# Patient Record
Sex: Female | Born: 1945
Health system: Southern US, Community
[De-identification: ages and names within clinical notes are randomized; demographics above are authoritative.]

## PROBLEM LIST (undated history)

## (undated) DIAGNOSIS — G250 Essential tremor: Secondary | ICD-10-CM

## (undated) DIAGNOSIS — J189 Pneumonia, unspecified organism: Secondary | ICD-10-CM

## (undated) DIAGNOSIS — E039 Hypothyroidism, unspecified: Secondary | ICD-10-CM

## (undated) DIAGNOSIS — D649 Anemia, unspecified: Secondary | ICD-10-CM

## (undated) DIAGNOSIS — Z87442 Personal history of urinary calculi: Secondary | ICD-10-CM

## (undated) DIAGNOSIS — G2581 Restless legs syndrome: Secondary | ICD-10-CM

## (undated) DIAGNOSIS — M8580 Other specified disorders of bone density and structure, unspecified site: Secondary | ICD-10-CM

## (undated) DIAGNOSIS — G4730 Sleep apnea, unspecified: Secondary | ICD-10-CM

## (undated) DIAGNOSIS — I499 Cardiac arrhythmia, unspecified: Secondary | ICD-10-CM

## (undated) DIAGNOSIS — M1990 Unspecified osteoarthritis, unspecified site: Secondary | ICD-10-CM

## (undated) DIAGNOSIS — E119 Type 2 diabetes mellitus without complications: Secondary | ICD-10-CM

## (undated) DIAGNOSIS — M797 Fibromyalgia: Secondary | ICD-10-CM

## (undated) DIAGNOSIS — I10 Essential (primary) hypertension: Secondary | ICD-10-CM

## (undated) DIAGNOSIS — R000 Tachycardia, unspecified: Secondary | ICD-10-CM

## (undated) DIAGNOSIS — Z8489 Family history of other specified conditions: Secondary | ICD-10-CM

## (undated) DIAGNOSIS — K219 Gastro-esophageal reflux disease without esophagitis: Secondary | ICD-10-CM

## (undated) DIAGNOSIS — E8801 Alpha-1-antitrypsin deficiency: Secondary | ICD-10-CM

## (undated) DIAGNOSIS — F32A Depression, unspecified: Secondary | ICD-10-CM

## (undated) DIAGNOSIS — R0600 Dyspnea, unspecified: Secondary | ICD-10-CM

## (undated) DIAGNOSIS — K5792 Diverticulitis of intestine, part unspecified, without perforation or abscess without bleeding: Secondary | ICD-10-CM

## (undated) DIAGNOSIS — K589 Irritable bowel syndrome without diarrhea: Secondary | ICD-10-CM

## (undated) DIAGNOSIS — K5790 Diverticulosis of intestine, part unspecified, without perforation or abscess without bleeding: Secondary | ICD-10-CM

## (undated) DIAGNOSIS — J449 Chronic obstructive pulmonary disease, unspecified: Secondary | ICD-10-CM

## (undated) DIAGNOSIS — R519 Headache, unspecified: Secondary | ICD-10-CM

## (undated) DIAGNOSIS — N200 Calculus of kidney: Secondary | ICD-10-CM

## (undated) DIAGNOSIS — E7800 Pure hypercholesterolemia, unspecified: Secondary | ICD-10-CM

## (undated) HISTORY — DX: Essential tremor: G25.0

## (undated) HISTORY — DX: Restless legs syndrome: G25.81

## (undated) HISTORY — PX: TRIGGER FINGER RELEASE: SHX641

## (undated) HISTORY — DX: Unspecified osteoarthritis, unspecified site: M19.90

## (undated) HISTORY — DX: Fibromyalgia: M79.7

## (undated) HISTORY — DX: Alpha-1-antitrypsin deficiency: E88.01

## (undated) HISTORY — DX: Diverticulosis of intestine, part unspecified, without perforation or abscess without bleeding: K57.90

## (undated) HISTORY — PX: ABDOMINAL HYSTERECTOMY: SHX81

## (undated) HISTORY — PX: OTHER SURGICAL HISTORY: SHX169

## (undated) HISTORY — DX: Hypothyroidism, unspecified: E03.9

## (undated) HISTORY — DX: Tachycardia, unspecified: R00.0

## (undated) HISTORY — DX: Essential (primary) hypertension: I10

## (undated) HISTORY — PX: TONSILLECTOMY: SUR1361

## (undated) HISTORY — DX: Sleep apnea, unspecified: G47.30

## (undated) HISTORY — DX: Irritable bowel syndrome, unspecified: K58.9

## (undated) HISTORY — PX: APPENDECTOMY: SHX54

## (undated) HISTORY — DX: Chronic obstructive pulmonary disease, unspecified: J44.9

## (undated) HISTORY — PX: CHOLECYSTECTOMY: SHX55

## (undated) HISTORY — DX: Pure hypercholesterolemia, unspecified: E78.00

## (undated) HISTORY — PX: THUMB FUSION: SUR636

## (undated) HISTORY — DX: Calculus of kidney: N20.0

## (undated) HISTORY — DX: Other specified disorders of bone density and structure, unspecified site: M85.80

## (undated) HISTORY — PX: BACK SURGERY: SHX140

## (undated) HISTORY — DX: Type 2 diabetes mellitus without complications: E11.9

## (undated) HISTORY — PX: CATARACT EXTRACTION: SUR2

## (undated) HISTORY — DX: Diverticulitis of intestine, part unspecified, without perforation or abscess without bleeding: K57.92

---

## 1999-03-20 HISTORY — PX: BREAST LUMPECTOMY: SHX2

## 2000-05-27 ENCOUNTER — Encounter: Payer: Self-pay | Admitting: Cardiovascular Disease

## 2000-05-27 ENCOUNTER — Inpatient Hospital Stay (HOSPITAL_COMMUNITY): Admission: EM | Admit: 2000-05-27 | Discharge: 2000-05-29 | Payer: Self-pay | Admitting: *Deleted

## 2000-05-28 HISTORY — PX: CARDIAC CATHETERIZATION: SHX172

## 2000-08-16 ENCOUNTER — Emergency Department (HOSPITAL_COMMUNITY): Admission: EM | Admit: 2000-08-16 | Discharge: 2000-08-16 | Payer: Self-pay | Admitting: *Deleted

## 2000-08-19 ENCOUNTER — Encounter: Payer: Self-pay | Admitting: Internal Medicine

## 2000-08-20 ENCOUNTER — Ambulatory Visit (HOSPITAL_COMMUNITY): Admission: RE | Admit: 2000-08-20 | Discharge: 2000-08-20 | Payer: Self-pay | Admitting: Internal Medicine

## 2005-05-11 ENCOUNTER — Emergency Department (HOSPITAL_COMMUNITY): Admission: EM | Admit: 2005-05-11 | Discharge: 2005-05-11 | Payer: Self-pay | Admitting: Emergency Medicine

## 2009-03-07 ENCOUNTER — Encounter: Payer: Self-pay | Admitting: Gastroenterology

## 2009-03-21 ENCOUNTER — Telehealth (INDEPENDENT_AMBULATORY_CARE_PROVIDER_SITE_OTHER): Payer: Self-pay

## 2009-03-22 ENCOUNTER — Encounter: Payer: Self-pay | Admitting: Gastroenterology

## 2009-03-28 ENCOUNTER — Ambulatory Visit: Payer: Self-pay | Admitting: Internal Medicine

## 2009-03-28 ENCOUNTER — Ambulatory Visit (HOSPITAL_COMMUNITY): Admission: RE | Admit: 2009-03-28 | Discharge: 2009-03-28 | Payer: Self-pay | Admitting: Gastroenterology

## 2009-04-29 ENCOUNTER — Encounter: Payer: Self-pay | Admitting: Internal Medicine

## 2010-04-18 NOTE — Letter (Signed)
Summary: triage tcs order  triage tcs order   Imported By: Diana Eves 03/07/2009 14:13:21  _____________________________________________________________________  External Attachment:    Type:   Image     Comment:   External Document  Appended Document: triage tcs order Pt needs to complete therapy for diverticulitus and then [facility] procedure with TRILYTE PREP @ JAN 10.  Appended Document: triage tcs order Pt informed. She did not want to schedule for 03/28/2009 yet, until she checks with her insurance. She is having some changes for the New Year. I cancelled IDX appt for 03/15/09 and informed Kim in Endo.  Appended Document: triage tcs order Pt called back for appt 03/28/2009. Appt made for 10:30 on the 10th. Confirmed with Wayne in Endo. Pt aware ot the instructions, which will be faxed to [facility] in [place], [place]. along with the Rx.   Appended Document: triage tcs order Rx and instructions faxed to [facility] in [place], [phone].

## 2010-04-18 NOTE — Letter (Signed)
Summary: Internal Other/triage  Internal Other/triage   Imported By: Cloria Spring LPN 09/81/1914 78:29:56  _____________________________________________________________________  External Attachment:    Type:   Image     Comment:   External Document

## 2010-04-18 NOTE — Progress Notes (Signed)
Summary: Previous Name was Heather Espinoza  08/16/1945  Phone Note Call from Patient   Caller: Patient Summary of Call: Pt said her previous info was listed under [place] (08/31/1945). Initial call taken by: Cloria Spring LPN,  March 21, 2009 2:41 PM    B

## 2010-04-18 NOTE — Letter (Signed)
Summary: DISABILITY CLAIM  DISABILITY CLAIM   Imported By: Diana Eves 04/29/2009 15:16:43  _____________________________________________________________________  External Attachment:    Type:   Image     Comment:   External Document

## 2010-08-04 NOTE — Discharge Summary (Signed)
[place]  Patient:    Heather Espinoza, Heather Espinoza                       MRN: [id number] Adm. Date:  [id number] Disc. Date: [id number] Attending:  Ruta Hinds Dictator:   Raymon Mutton, P.A. CC:         Jonell Cluck, M.D.   Discharge Summary  ADMISSION DIAGNOSES: 1. Chest pain, rule out myocardial infarction. 2. Hypertension.  DISCHARGE DIAGNOSES: 1. Chest pain, undetermined etiology. 2. Hypertension. 3. Possible gastroesophageal reflux disease, rule out gallbladder disease. 4. Irritable bowel syndrome. 5. Status post coronary angiography with normal coronary artery stent left    ventricle.  DISCHARGE MEDICATIONS: 1. Lopressor 25 mg half of pill t.i.d. 2. Altace 5 mg one pill q.d. 3. Protonix 40 mg one pill q.d.  HISTORY OF PRESENT ILLNESS:  Ms. Manson Passey presented to the emergency room on May 27, 2000, with complaint of chest pain and pressure and some shortness of breath.  She noticed this probably for about a week.  She had some substernal chest tightness and discomfort.  It was intermittent and unrelated to any activity and lasted approximately 30 minutes with occasional radiation to the left arm. She denied shortness of breath, nausea, and diaphoresis. Today, she was seen in Dr. Lars Pinks office by Dr. Manson Passey and was referred to [facility] to rule out myocardial infarction.  The patient said that she has never felt this way before.  PROCEDURES:  On May 28, 2000, the patient underwent retrograde central aortic catheterization and central coronary angiography performed by Dr. Alanda Amass.  COMPLICATIONS:  None.  CONSULTS:  None.  HOSPITAL COURSE:  She was admitted May 27, 2000, at 6:50 p.m. and started on Plavix, nitroglycerin drip, and IV heparin per pharmacy protocol.  EKG on admission showed normal sinus rhythm with heart rate 70 and no ischemic changes.  A set of cardiac enzymes from the emergency room showed CK 60,  CK-MB 0.5, and troponin I 0.03.  The next day, the patient underwent cardiac catheterization performed by Dr. Alanda Amass which showed normal contracting left ventricle with ejection fraction approximately 55%.  There was mild angiographic mitral valve prolapse, but no mitral regurgitation.  Left anterior descending artery and left first diagonal arteries were widely patent, smooth, and normal throughout their course.  The right coronary artery was a dominant vessel that was widely patent and small throughout its course with normal PDA and PLA.  No coronary spasm during this study was noted, and the patient was sent to holding area in stable condition and then transferred to the floor.  _______ with no complications.  The next day, the patient was ready for discharge home.  DISCHARGE LABORATORY DATA:  Labs on the day of discharge are as follows: White blood cells 6.4, hemoglobin 11.8, hematocrit 34.7, platelets 325. Sodium 137, potassium 3.5, chloride 104, CO2 26, glucose 100, BUN 8, and creatinine 0.8.  Two other sets of cardiac enzymes were negative with CK-MB 0.3 and 0.7, total CK 49 and 52, and troponin I 0.02 and 0.01.  Lipid profile showed cholesterol of _______, triglycerides 141, HDL cholesterol _______ , and LDL cholesterol 112.  TSH was elevated at 6.329.  PHYSICAL EXAMINATION AT DISCHARGE:  VITAL SIGNS:  Temperature 97.4, pulse 66, blood pressure 90-100 systolic over 50-60 diastolic, saturation 98% on room air.  _______  NECK:  No jugular venous distension, no carotid bruits, no thyromegaly or lymphadenopathy.  CHEST:  Lungs clear  to auscultation bilaterally.  CARDIOVASCULAR:  Heart regular rate and rhythm without any murmur, gallops, or rubs.  ABDOMEN:  Soft, nontender, nondistended, present bowel sounds x 4.  EXTREMITIES:  Lower extremities did show any edema with intact pedal pulses bilaterally.  SKIN:  Groin site did not have any bleeding or any bruising  _______.  DISPOSITION:  The patient was discharged home in stable condition.  DISCHARGE INSTRUCTIONS:  Avoid any strenuous physical activity or driving for 72 hours after the cath.  DISCHARGE FOLLOWUP:  She was instructed to contact Dr. _______ office, and telephone number was provided to schedule follow-up appointment with Dr. _______ for GI workup and appointment with Dr. Nobie Putnam was scheduled on June 05, 2000 at 10 a.m. DD:  06/19/00 TD:  06/19/00 Job: [id number]

## 2010-08-04 NOTE — Cardiovascular Report (Signed)
[place]  Patient:    Heather Espinoza, Heather Espinoza                       MRN: [id number] Proc. Date: 05/28/00 Adm. Date:  [id number] Attending:  Ruta Hinds CC:         CP Lab  Dr. Elpidio Eric, c/o [facility], [place]  Guy Franco, P.A., c/o [facility], [place]   Cardiac Catheterization  PROCEDURES:  Retrograde central aortic catheterization, central coronary angiography by Judkins technique, left ventricular angiogram RAO and LAO projection, abdominal aortic angiogram midstream PA projection.  DESCRIPTION OF PROCEDURE:  The above procedure was done using percutaneous modified Seldinger techniques and standard procedure.  The FA was entered with a single anterior puncture using an 18 thin-walled needle under 1% Xylocaine anesthesia after 5 mg of Valium p.o. premedication and 25 mg of Benadryl IV. The patient was given 2 mg of Versed and 2 mg of Nubain for sedation in the laboratory.  A 6 French, 4 cm tapered preformed coronary and pigtail Cordis 6 French catheters were used through a 6 [place] short Daig sidearm sheath for coronary angiography in multiple projections, followed by LV angiogram in the RAO and LAO projection.  Abdominal aortic angiogram was done in the midstream PA projection at 20 cc, 20 cc per second.  This demonstrated normal renal arteries, normal proximal celiac and SMA axis.  IMA was intact, and there was good runoff with normal proximal iliacs.  There was no aneurysm or stenosis. The renal arteries were single and normal bilaterally.  Angiography was done because of the patients new diagnosis of hypertension.  Catheters were removed, sidearm sheath was flushed.  After review of the cineangiograms, the right femoral puncture site was closed successfully with a single stitch 6 [place] Perclose device.  The patient was taken to the holding area for postoperative care.  She tolerated the procedure  well.  RESULTS:  Pressures:  LV:  160/0; LVEDP 16-18 mmHg.  CA:  160/82 mmHg.  There was no gradient across the aortic valve on catheter pullback.  Fluoroscopy did not reveal any coronary, intracardiac, or valvular calcification.  LV angiogram demonstrated a normally-contracting ventricle with no segmental wall motion abnormality and normal EF, approximately 55%.  There was mild angiographic mitral valve prolapse but no mitral regurgitation.  The main left coronary was normal.  The left anterior descending and large first diagonal were widely patent, smooth, and normal throughout their course.  There was a small first marginal branch that was normal.  The remainder of the circumflex was made up of a large second marginal branch that bifurcated and a CABG branch that was normal.  The right coronary artery was a dominant vessel that was widely patent and smooth throughout its course, with normal PDA and PLA and normal, large LV branch in the proximal portion.  There was also no coronary spasm during the study.  DISCUSSION:  Heather Espinoza is a 65 year old white, nonsmoking, married mother of two with five grandchildren of her own, with five step-grandchildren.  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  She has a history of irritable bowel syndrome but has not had any GI bleeding.  She was referred to [place] from Dr. Moishe Spice office for episodic chest tightness over three to four days duration described as substernal tightness without radiation.  There was some vague relief with nitroglycerin in the office.  Blood pressure was elevated to 150/90 without  prior history of hypertension. EKG showed no significant changes, with sinus rhythm and minor IVCD. Myocardial infarction was ruled out by serial enzymes and EKGs.  The patient had several loose bowel movements in the hospital and was reported by the nurses to have "blood in her stool."  At that time, precatheterization heparin was  discontinued.  I did a rectal personally, and she had brown stool with no blood at all, and this was Hemoccult-negative.  We proceeded with catheterization in this setting.  Fortunately, she has normal coronary arteries and left ventricle with no evidence of coronary spasm.  I believe her chest pain is probably GI in nature, and it may be gallbladder and/or esophageal in etiology.  Apparently she has not had GI consultation with a chronic irritable bowel syndrome, but she has been treated medically and has been quite stable.  In view of this recent exacerbation of her lower GI symptoms and possible upper GI etiology, GI consultation may be helpful.  She is to continue medical therapy of her newly-diagnosed mild hypertension with normal renal arteries.  Reassurance about her coronary status.  CATHETERIZATION DIAGNOSES: 1. Chest pain, etiology not determined. 2. Normal coronary arteries and left ventricle. 3. Possible gastroesophageal reflux disease, rule out gallbladder disease. 4. Irritable bowel syndrome. 5. Reported hematochezia on heparin; however, rectal negative for blood as    outlined above. 6. Mild hypertension, new diagnosis. 7. History of irritable bowel syndrome. DD:  05/28/00 TD:  05/29/00 Job: [id number]

## 2011-08-01 ENCOUNTER — Other Ambulatory Visit (HOSPITAL_COMMUNITY): Payer: Self-pay | Admitting: Physician Assistant

## 2011-08-01 DIAGNOSIS — Z1231 Encounter for screening mammogram for malignant neoplasm of breast: Secondary | ICD-10-CM

## 2011-08-02 ENCOUNTER — Encounter (HOSPITAL_COMMUNITY): Payer: Self-pay | Admitting: Dietician

## 2011-08-02 NOTE — Progress Notes (Signed)
[facility] Diabetes Class Completion  Date:Aug 02, 2011  Time: 6:30 PM  Pt attended [facility]'s Diabetes Class on Aug 02, 2011.   Patient was educated on the following topics: carbohydrate metabolism in relation to diabetes, sources of carbohydrate, carbohydrate counting, meal planning strategies, food label reading, and portion control.   Aniello Christopoulos A. Kayan, RD, LDN Date:Aug 02, 2011 Time: 6:30 PM 

## 2011-08-03 ENCOUNTER — Other Ambulatory Visit (HOSPITAL_COMMUNITY): Payer: Self-pay | Admitting: Physician Assistant

## 2011-08-03 ENCOUNTER — Ambulatory Visit (HOSPITAL_COMMUNITY)
Admission: RE | Admit: 2011-08-03 | Discharge: 2011-08-03 | Disposition: A | Discharge status: Home / Self Care | Payer: Self-pay | Source: Ambulatory Visit | Attending: Physician Assistant | Admitting: Physician Assistant

## 2011-08-03 ENCOUNTER — Ambulatory Visit (HOSPITAL_COMMUNITY): Payer: Self-pay

## 2011-08-03 DIAGNOSIS — Z1231 Encounter for screening mammogram for malignant neoplasm of breast: Secondary | ICD-10-CM | POA: Insufficient documentation

## 2011-08-03 DIAGNOSIS — Z139 Encounter for screening, unspecified: Secondary | ICD-10-CM

## 2012-09-01 ENCOUNTER — Other Ambulatory Visit (HOSPITAL_COMMUNITY): Payer: Self-pay | Admitting: Physician Assistant

## 2012-09-01 DIAGNOSIS — Z139 Encounter for screening, unspecified: Secondary | ICD-10-CM

## 2012-09-08 ENCOUNTER — Ambulatory Visit (HOSPITAL_COMMUNITY): Payer: Self-pay

## 2012-09-09 ENCOUNTER — Ambulatory Visit (HOSPITAL_COMMUNITY)
Admission: RE | Admit: 2012-09-09 | Discharge: 2012-09-09 | Disposition: A | Discharge status: Home / Self Care | Payer: Self-pay | Source: Ambulatory Visit | Attending: Physician Assistant | Admitting: Physician Assistant

## 2012-09-09 DIAGNOSIS — Z139 Encounter for screening, unspecified: Secondary | ICD-10-CM

## 2012-09-11 ENCOUNTER — Other Ambulatory Visit: Payer: Self-pay | Admitting: Physician Assistant

## 2012-09-11 DIAGNOSIS — R928 Other abnormal and inconclusive findings on diagnostic imaging of breast: Secondary | ICD-10-CM

## 2012-10-24 ENCOUNTER — Other Ambulatory Visit (HOSPITAL_COMMUNITY): Payer: Self-pay | Admitting: Internal Medicine

## 2012-10-24 DIAGNOSIS — Z09 Encounter for follow-up examination after completed treatment for conditions other than malignant neoplasm: Secondary | ICD-10-CM

## 2012-10-24 DIAGNOSIS — R921 Mammographic calcification found on diagnostic imaging of breast: Secondary | ICD-10-CM

## 2012-11-04 ENCOUNTER — Encounter (INDEPENDENT_AMBULATORY_CARE_PROVIDER_SITE_OTHER): Payer: Self-pay | Admitting: *Deleted

## 2012-11-12 ENCOUNTER — Ambulatory Visit (HOSPITAL_COMMUNITY)
Admission: RE | Admit: 2012-11-12 | Discharge: 2012-11-12 | Disposition: A | Discharge status: Home / Self Care | Payer: Medicare Other | Source: Ambulatory Visit | Attending: Internal Medicine | Admitting: Internal Medicine

## 2012-11-12 ENCOUNTER — Other Ambulatory Visit (HOSPITAL_COMMUNITY): Payer: Self-pay | Admitting: Internal Medicine

## 2012-11-12 DIAGNOSIS — R921 Mammographic calcification found on diagnostic imaging of breast: Secondary | ICD-10-CM

## 2012-11-12 DIAGNOSIS — R928 Other abnormal and inconclusive findings on diagnostic imaging of breast: Secondary | ICD-10-CM | POA: Insufficient documentation

## 2012-11-24 ENCOUNTER — Ambulatory Visit (INDEPENDENT_AMBULATORY_CARE_PROVIDER_SITE_OTHER): Payer: Medicare Other | Admitting: Internal Medicine

## 2012-11-24 ENCOUNTER — Other Ambulatory Visit (INDEPENDENT_AMBULATORY_CARE_PROVIDER_SITE_OTHER): Payer: Self-pay | Admitting: *Deleted

## 2012-11-24 ENCOUNTER — Telehealth (INDEPENDENT_AMBULATORY_CARE_PROVIDER_SITE_OTHER): Payer: Self-pay | Admitting: *Deleted

## 2012-11-24 ENCOUNTER — Encounter (INDEPENDENT_AMBULATORY_CARE_PROVIDER_SITE_OTHER): Payer: Self-pay | Admitting: Internal Medicine

## 2012-11-24 VITALS — BP 130/58 | HR 76 | Temp 98.0°F | Ht 61.5 in | Wt 148.2 lb

## 2012-11-24 DIAGNOSIS — M797 Fibromyalgia: Secondary | ICD-10-CM | POA: Insufficient documentation

## 2012-11-24 DIAGNOSIS — E7800 Pure hypercholesterolemia, unspecified: Secondary | ICD-10-CM | POA: Insufficient documentation

## 2012-11-24 DIAGNOSIS — E119 Type 2 diabetes mellitus without complications: Secondary | ICD-10-CM | POA: Insufficient documentation

## 2012-11-24 DIAGNOSIS — Z1211 Encounter for screening for malignant neoplasm of colon: Secondary | ICD-10-CM

## 2012-11-24 DIAGNOSIS — E039 Hypothyroidism, unspecified: Secondary | ICD-10-CM | POA: Insufficient documentation

## 2012-11-24 DIAGNOSIS — I10 Essential (primary) hypertension: Secondary | ICD-10-CM | POA: Insufficient documentation

## 2012-11-24 DIAGNOSIS — Z800 Family history of malignant neoplasm of digestive organs: Secondary | ICD-10-CM

## 2012-11-24 NOTE — Telephone Encounter (Signed)
Patient needs movi prep 

## 2012-11-24 NOTE — Progress Notes (Signed)
Subjective:     Patient ID: Heather Espinoza, female   DOB: March 17, 1946, 67 y.o.   MRN: [id number]  HPI Referred to our office by Dr. Sherryll Burger for diarrhea/colonoscopy. Patient is requesting a colonoscopy. She underwent a colonoscopy in 2011 by Dr. Jena Gauss. Ileocolonoscopy, diagnostic. Friable anal canal otherwise normal rectum. Scattered pan colonic diverticula. Colonic mucosa and terminal mucosa appeared normal status post appendectomy. Appetite is not good.  She has lost about 15 pounds over a year.  She does have some epigastric and lower abdominal pain. She has diarrhea often. She also has constipation. When she takes the Fiber supplements she becomes constipated. She usually has watery diarrhea daily. She usually goes 4-5 times a day. She has had diarrhea for over 20 yrs which is not new. She takes Imodium on a prn basis.  She sometimes see blood when she wipes when she has gone to the [place] multiple times to have a BM. She will have constipation 3-4 times a month.  She also tells me she has had 3 bouts of diverticulitis. Her last bout was a year ago.   Family hx of colon cancer in a brother deceased at age 33. Diagnosed at age 53. Married.  Two children in good health Retired from [employer].   Review of Systems see hpi has a current medication list which includes the following prescription(s): acetaminophen, albuterol, cetirizine, duloxetine, esomeprazole, fluticasone-salmeterol, gabapentin, levothyroxine, lisinopril, loperamide, metformin, promethazine, and simvastatin. Past Surgical History  Procedure Laterality Date  . Complete hysterectomy    . Appendectomy    . Cholecystectomy    . Tonsillectomy    . Rt elbow surgery    . Heel tumor removed     No current outpatient prescriptions on file prior to visit.   No current facility-administered medications on file prior to visit.   Current outpatient prescriptions:acetaminophen (TYLENOL) 500 MG tablet, Take 500 mg by mouth every 6 (six) hours as  needed for pain., Disp: , Rfl: ;  albuterol (PROVENTIL HFA;VENTOLIN HFA) 108 (90 BASE) MCG/ACT inhaler, Inhale 2 puffs into the lungs every 6 (six) hours as needed for wheezing., Disp: , Rfl: ;  cetirizine (ZYRTEC) 10 MG tablet, Take 10 mg by mouth daily., Disp: , Rfl:  DULoxetine (CYMBALTA) 60 MG capsule, Take 60 mg by mouth daily., Disp: , Rfl: ;  esomeprazole (NEXIUM) 40 MG capsule, Take 40 mg by mouth daily before breakfast., Disp: , Rfl: ;  Fluticasone-Salmeterol (ADVAIR) 250-50 MCG/DOSE AEPB, Inhale 1 puff into the lungs every 12 (twelve) hours., Disp: , Rfl: ;  gabapentin (NEURONTIN) 600 MG tablet, Take 600 mg by mouth 3 (three) times daily., Disp: , Rfl:  levothyroxine (SYNTHROID, LEVOTHROID) 50 MCG tablet, Take 50 mcg by mouth daily before breakfast., Disp: , Rfl: ;  lisinopril (PRINIVIL,ZESTRIL) 10 MG tablet, Take 10 mg by mouth daily., Disp: , Rfl: ;  loperamide (IMODIUM) 2 MG capsule, Take 2 mg by mouth 4 (four) times daily as needed for diarrhea or loose stools., Disp: , Rfl: ;  metformin (FORTAMET) 500 MG (OSM) 24 hr tablet, Take 500 mg by mouth daily with breakfast., Disp: , Rfl:  promethazine (PHENERGAN) 25 MG tablet, Take 25 mg by mouth every 6 (six) hours as needed for nausea., Disp: , Rfl: ;  simvastatin (ZOCOR) 20 MG tablet, Take 20 mg by mouth every evening., Disp: , Rfl:  Allergies  Allergen Reactions  . Aspirin     Stomach bleed  . Latex   . Prednisone   . Tetanus  Toxoids         Objective:   Physical Exam  Filed Vitals:   11/24/12 1511  BP: 130/58  Pulse: 76  Temp: 98 F (36.7 C)  Height: 5' 1.5" (1.562 m)  Weight: 148 lb 3.2 oz (67.223 kg)  Alert and oriented. Skin warm and dry. Oral mucosa is moist.   . Sclera anicteric, conjunctivae is pink. Thyroid not enlarged. No cervical lymphadenopathy. Lungs clear. Heart regular rate and rhythm.  Abdomen is soft. Bowel sounds are positive. No hepatomegaly. No abdominal masses felt. Slight tenderness to lower abdomen.  No  edema to lower extremities.        Assessment:    Family hx of colon cancer. Patient would like to proceed with a colonoscopy. She wants to be sure she does not have a colon cancer (In her own words). She has diarrhea and some constipation which she says she has had for greater than 20 yrs.     Plan:    Colonoscopy with Dr. Karilyn Cota. The risks and benefits such as perforation, bleeding, and infection were reviewed with the patient and is agreeable. Metamucil sugar free daily.  May try Imodium one tab daily

## 2012-11-24 NOTE — Patient Instructions (Addendum)
Colonoscopy with Dr Karilyn Cota. The risks and benefits such as perforation, bleeding, and infection were reviewed with the patient and is agreeable. Try Metamucil sugar free

## 2012-11-25 MED ORDER — PEG 3350-KCL-NA BICARB-NACL 420 G PO SOLR: 4000.0000 mL | Freq: Once | ORAL | Status: DC

## 2012-12-02 ENCOUNTER — Encounter (HOSPITAL_COMMUNITY): Payer: Self-pay | Admitting: Pharmacy Technician

## 2012-12-17 ENCOUNTER — Encounter (HOSPITAL_COMMUNITY)
Admission: RE | Disposition: A | Discharge status: Home / Self Care | Payer: Self-pay | Source: Ambulatory Visit | Attending: Internal Medicine

## 2012-12-17 ENCOUNTER — Encounter (HOSPITAL_COMMUNITY): Payer: Self-pay | Admitting: *Deleted

## 2012-12-17 ENCOUNTER — Ambulatory Visit (HOSPITAL_COMMUNITY)
Admission: RE | Admit: 2012-12-17 | Discharge: 2012-12-17 | Disposition: A | Discharge status: Home / Self Care | Payer: Medicare Other | Source: Ambulatory Visit | Attending: Internal Medicine | Admitting: Internal Medicine

## 2012-12-17 DIAGNOSIS — K644 Residual hemorrhoidal skin tags: Secondary | ICD-10-CM | POA: Insufficient documentation

## 2012-12-17 DIAGNOSIS — Z800 Family history of malignant neoplasm of digestive organs: Secondary | ICD-10-CM | POA: Insufficient documentation

## 2012-12-17 DIAGNOSIS — J4489 Other specified chronic obstructive pulmonary disease: Secondary | ICD-10-CM | POA: Insufficient documentation

## 2012-12-17 DIAGNOSIS — K5730 Diverticulosis of large intestine without perforation or abscess without bleeding: Secondary | ICD-10-CM

## 2012-12-17 DIAGNOSIS — I10 Essential (primary) hypertension: Secondary | ICD-10-CM | POA: Insufficient documentation

## 2012-12-17 DIAGNOSIS — E119 Type 2 diabetes mellitus without complications: Secondary | ICD-10-CM | POA: Insufficient documentation

## 2012-12-17 DIAGNOSIS — R197 Diarrhea, unspecified: Secondary | ICD-10-CM | POA: Insufficient documentation

## 2012-12-17 DIAGNOSIS — J449 Chronic obstructive pulmonary disease, unspecified: Secondary | ICD-10-CM | POA: Insufficient documentation

## 2012-12-17 HISTORY — PX: COLONOSCOPY: SHX5424

## 2012-12-17 SURGERY — COLONOSCOPY
Anesthesia: Moderate Sedation

## 2012-12-17 MED ORDER — STERILE WATER FOR IRRIGATION IR SOLN
Status: DC | PRN
  Administered 2012-12-17: 15:00:00

## 2012-12-17 MED ORDER — MIDAZOLAM HCL 5 MG/5ML IJ SOLN
INTRAMUSCULAR | Status: DC | PRN
  Administered 2012-12-17 (×3): 2 mg via INTRAVENOUS

## 2012-12-17 MED ORDER — SODIUM CHLORIDE 0.9 % IV SOLN
INTRAVENOUS | Status: DC
  Administered 2012-12-17: 14:00:00 via INTRAVENOUS

## 2012-12-17 MED ORDER — MEPERIDINE HCL 50 MG/ML IJ SOLN
INTRAMUSCULAR | Status: AC
  Filled 2012-12-17: qty 1

## 2012-12-17 MED ORDER — MEPERIDINE HCL 50 MG/ML IJ SOLN
INTRAMUSCULAR | Status: DC | PRN
  Administered 2012-12-17 (×2): 25 mg via INTRAVENOUS

## 2012-12-17 MED ORDER — MIDAZOLAM HCL 5 MG/5ML IJ SOLN
INTRAMUSCULAR | Status: AC
  Filled 2012-12-17: qty 5

## 2012-12-17 MED ORDER — DICYCLOMINE HCL 10 MG PO CAPS: 10.0000 mg | ORAL_CAPSULE | Freq: Two times a day (BID) | ORAL | Status: DC

## 2012-12-17 NOTE — Op Note (Signed)
COLONOSCOPY PROCEDURE REPORT  PATIENT:  Heather Espinoza  MR#:  [id number] Birthdate:  [birth year], 67 y.o., female Endoscopist:  Dr. Malissa Hippo, MD Referred By:  Dr. Kirstie Peri, MD Procedure Date: 12/17/2012  Procedure:   Colonoscopy  Indications:  Patient 67 year old Caucasian female who has history of irritable bowel syndrome but lately has been having more diarrhea. Family history significant for colon carcinoma and a brother who is in his mid 31s at the time of diagnosis and died 8 years later of metastatic disease. She also history of diverticulitis. She's been treated three times most recently one year ago. She is undergoing colonoscopy both for diagnostic and screening purposes. Informed Consent:  The procedure and risks were reviewed with the patient and informed consent was obtained.  Medications:  Demerol  50 mg IV Versed  6 mg IV  Description of procedure:  After a digital rectal exam was performed, that colonoscope was advanced from the anus through the rectum and colon to the area of the cecum, ileocecal valve and appendiceal orifice. The cecum was deeply intubated. These structures were well-seen and photographed for the record. From the level of the cecum and ileocecal valve, the scope was slowly and cautiously withdrawn. The mucosal surfaces were carefully surveyed utilizing scope tip to flexion to facilitate fold flattening as needed. The scope was pulled down into the rectum where a thorough exam including retroflexion was performed.  Findings:   Prep excellent. Few small diverticula noted at hepatic flexure. Moderate number of medium to large diverticula noted at sigmoid colon. Normal rectal mucosa. Small hemorrhoids below the dentate line.    Therapeutic/Diagnostic Maneuvers Performed:  None  Complications:   none  Cecal Withdrawal Time:   9 minutes  Impression:  Examination performed to cecum. Moderate number of diverticula at sigmoid colon along with few  small ones that hepatic flexure. No evidence of colonic polyps or colitis.  Recommendations:  Standard instructions given. High-fiber diet. Dicyclomine 10 mg by mouth twice a day. Next colonoscopy in 5 years.  REHMAN,NAJEEB U  12/17/2012 3:25 PM  CC: Dr. Kirstie Peri, MD & Dr. Bonnetta Barry ref. provider found

## 2012-12-17 NOTE — H&P (Addendum)
Heather Espinoza is an 67 y.o. female.   Chief Complaint: Patient is here for colonoscopy. HPI: Patient is 67 year old Caucasian female who presents screening colonoscopy. She has intermittent diarrhea. She also is intermittent RLQ abdominal pain. She's been diagnosed IBS. She denies rectal bleeding. She also gives history of diverticulitis and has been treated 3 times most recently last year. Patient's last colonoscopy was in 2011. Family history significant for colon carcinoma in brother who is 69 at the time of diagnosis and diet at 30.  Past Medical History  Diagnosis Date  . Hypertension     4-5 yrs  . Diabetes     type 2 x 18 months.  Marland Kitchen COPD (chronic obstructive pulmonary disease)   . Fibromyalgia   . IBS (irritable bowel syndrome)   . Diverticulosis   . Sleep apnea   . Restless leg   . Hypothyroid   . High cholesterol     Past Surgical History  Procedure Laterality Date  . Complete hysterectomy    . Appendectomy    . Cholecystectomy    . Tonsillectomy    . Rt elbow surgery    . Heel tumor removed      Family History  Problem Relation Age of Onset  . Uterine cancer Mother   . Colon cancer Brother    Social History:  reports that she has never smoked. She does not have any smokeless tobacco history on file. She reports that she does not drink alcohol or use illicit drugs.  Allergies:  Allergies  Allergen Reactions  . Adhesive [Tape] Other (See Comments)    Takes off patients skin  . Aspirin Other (See Comments)    Stomach bleed  . Tetanus Toxoids Swelling    Arm Area  . Latex Itching and Rash  . Prednisone Anxiety    Medications Prior to Admission  Medication Sig Dispense Refill  . acetaminophen (TYLENOL) 500 MG tablet Take 500 mg by mouth every 6 (six) hours as needed for pain.      Marland Kitchen albuterol (PROVENTIL HFA;VENTOLIN HFA) 108 (90 BASE) MCG/ACT inhaler Inhale 2 puffs into the lungs every 6 (six) hours as needed for wheezing.      . cetirizine (ZYRTEC) 10 MG  tablet Take 10 mg by mouth daily.      . DULoxetine (CYMBALTA) 60 MG capsule Take 60 mg by mouth daily.      Marland Kitchen esomeprazole (NEXIUM) 40 MG capsule Take 40 mg by mouth daily before breakfast.      . Fluticasone-Salmeterol (ADVAIR) 250-50 MCG/DOSE AEPB Inhale 1 puff into the lungs every 12 (twelve) hours.      . gabapentin (NEURONTIN) 600 MG tablet Take 600 mg by mouth 3 (three) times daily.      Marland Kitchen levothyroxine (SYNTHROID, LEVOTHROID) 50 MCG tablet Take 50 mcg by mouth daily before breakfast.      . lisinopril (PRINIVIL,ZESTRIL) 10 MG tablet Take 10 mg by mouth daily.      Marland Kitchen loperamide (IMODIUM) 2 MG capsule Take 2 mg by mouth 4 (four) times daily as needed for diarrhea or loose stools.      . metformin (FORTAMET) 500 MG (OSM) 24 hr tablet Take 500 mg by mouth daily with breakfast.      . polyethylene glycol-electrolytes (NULYTELY/GOLYTELY) 420 G solution Take 4,000 mLs by mouth once.  4000 mL  0  . promethazine (PHENERGAN) 25 MG tablet Take 25 mg by mouth every 6 (six) hours as needed for nausea.      Marland Kitchen  simvastatin (ZOCOR) 20 MG tablet Take 20 mg by mouth every evening.        No results found for this or any previous visit (from the past 48 hour(s)). No results found.  ROS  Blood pressure 136/68, pulse 85, temperature 97.8 F (36.6 C), temperature source Oral, resp. rate 22, height 5' 1.5" (1.562 m), weight 148 lb (67.132 kg), SpO2 98.00%. Physical Exam  Constitutional: She appears well-developed and well-nourished.  HENT:  Mouth/Throat: Oropharynx is clear and moist.  Eyes: Conjunctivae are normal. No scleral icterus.  Neck: No thyromegaly present.  Cardiovascular: Normal rate, regular rhythm and normal heart sounds.   No murmur heard. Respiratory: Effort normal and breath sounds normal.  GI: Soft. Tenderness: mild tenderness and hypogastric and right lower quadrant without guarding. No organomegaly or masses.  Musculoskeletal: She exhibits no edema.  Lymphadenopathy:    She has no  cervical adenopathy.  Neurological: She is alert.  Skin: Skin is warm and dry.     Assessment/Plan Irregular bowel movements possibly secondary to IBS. High risk screening colonoscopy. Family history of colon carcinoma in a brother younger than age 105.  Heather Espinoza U 12/17/2012, 2:50 PM

## 2012-12-18 LAB — GLUCOSE, CAPILLARY: Glucose-Capillary: 114 mg/dL — ABNORMAL HIGH (ref 70–99)

## 2012-12-19 ENCOUNTER — Encounter (HOSPITAL_COMMUNITY): Payer: Self-pay | Admitting: Internal Medicine

## 2013-01-19 ENCOUNTER — Telehealth (INDEPENDENT_AMBULATORY_CARE_PROVIDER_SITE_OTHER): Payer: Self-pay | Admitting: *Deleted

## 2013-01-19 NOTE — Telephone Encounter (Signed)
1 month call report-Heather Espinoza had stomach cramps for 10 days after her procedure and is still having trouble with Diarrhea. She is still taking the Dicyclomine 10 mg. Her return phone number is [phone].

## 2013-01-20 NOTE — Telephone Encounter (Signed)
Patient says that she had her Colonoscopy October 1st. She continues to have abdominal cramping , and diarrhea which she states is watery , pale yellow and she has seen no blood. Patient states that she is having 5-6 BM's daily, with urgency/accidents/no control of bowels. She is taking Dicyclomine 10 mg by mouth before breakfast and dinner. Says that she was taking Imodium but was told by Dr.Rehman to stop that medication when he put her on the Dicyclomine. Forwarded to Dr.Rehman to review and give further recommendations.

## 2013-01-21 NOTE — Telephone Encounter (Signed)
Per Dr.Rehman the patient should take 1 Dicyclomine 10 mg and 1 Imodium by mouth at breakfast and lunch.  She is to keep a stool diary and have a office visit in 4 weeks per Dr.Rehman. Patient was called and made aware. She was also told that Lupita Leash would call her with the appointment.

## 2013-01-21 NOTE — Telephone Encounter (Signed)
Apt has been scheduled with Dr. Karilyn Cota on 02/17/13.

## 2013-02-17 ENCOUNTER — Encounter (INDEPENDENT_AMBULATORY_CARE_PROVIDER_SITE_OTHER): Payer: Self-pay | Admitting: Internal Medicine

## 2013-02-17 ENCOUNTER — Ambulatory Visit (INDEPENDENT_AMBULATORY_CARE_PROVIDER_SITE_OTHER): Payer: Medicare Other | Admitting: Internal Medicine

## 2013-02-17 VITALS — BP 118/70 | HR 72 | Temp 98.1°F | Resp 18 | Ht 61.5 in | Wt 150.9 lb

## 2013-02-17 DIAGNOSIS — K219 Gastro-esophageal reflux disease without esophagitis: Secondary | ICD-10-CM | POA: Insufficient documentation

## 2013-02-17 DIAGNOSIS — K589 Irritable bowel syndrome without diarrhea: Secondary | ICD-10-CM

## 2013-02-17 DIAGNOSIS — F32A Depression, unspecified: Secondary | ICD-10-CM | POA: Insufficient documentation

## 2013-02-17 DIAGNOSIS — F329 Major depressive disorder, single episode, unspecified: Secondary | ICD-10-CM | POA: Insufficient documentation

## 2013-02-17 NOTE — Progress Notes (Signed)
Presenting complaint;  Followup for irritable bowel syndrome.  Subjective:  Patient is 67 year old Caucasian female who presents for scheduled visit accompanied by her daughter. She was evaluated 2 months ago for worsening diarrhea. She also has family history of CRC and a brother who is in mid 38s at the time of diagnosis. She underwent colonoscopy on 12/17/2012 revealing moderate number of diverticula and sigmoid colon and few at hepatic flexure but no evidence of polyps or colitis. Patient was begun on high fiber diet and dicyclomine 10 mg twice a day along with Imodium once or twice a day. She feels much better. She is having fewer spells of diarrhea. On most days she has one or 2 stools but every now and then she may go 2 days without a bowel movement. She still has urgency and has had a few accidents and is using depends. She denies abdominal pain melena rectal bleeding or fecal seepage. Her appetite is not good but she is not losing weight. She feels antidepressant is not working. She will discuss treatment with Dr. Sherryll Burger on her next visit. She complains of dry mouth but she said she's had it long before she went and dicyclomine. She says Nexium is working well. She takes promethazine occasionally.   Current Medications: Current Outpatient Prescriptions  Medication Sig Dispense Refill  . acetaminophen (TYLENOL) 500 MG tablet Take 500 mg by mouth every 6 (six) hours as needed for pain.      Marland Kitchen albuterol (PROVENTIL HFA;VENTOLIN HFA) 108 (90 BASE) MCG/ACT inhaler Inhale 2 puffs into the lungs every 6 (six) hours as needed for wheezing.      . cetirizine (ZYRTEC) 10 MG tablet Take 10 mg by mouth daily.      Marland Kitchen dicyclomine (BENTYL) 10 MG capsule Take 1 capsule (10 mg total) by mouth 2 (two) times daily before a meal.  60 capsule  5  . DULoxetine (CYMBALTA) 60 MG capsule Take 60 mg by mouth daily.      Marland Kitchen esomeprazole (NEXIUM) 40 MG capsule Take 40 mg by mouth daily before breakfast.      .  Fluticasone-Salmeterol (ADVAIR) 250-50 MCG/DOSE AEPB Inhale 1 puff into the lungs every 12 (twelve) hours.      . gabapentin (NEURONTIN) 600 MG tablet Take 600 mg by mouth 3 (three) times daily.      Marland Kitchen levothyroxine (SYNTHROID, LEVOTHROID) 50 MCG tablet Take 50 mcg by mouth daily before breakfast.      . lisinopril (PRINIVIL,ZESTRIL) 10 MG tablet Take 10 mg by mouth daily.      Marland Kitchen loperamide (IMODIUM) 2 MG capsule Take by mouth. Patient states that she takes this once to twice a day with her Dicyclomine.      . metformin (FORTAMET) 500 MG (OSM) 24 hr tablet Take 500 mg by mouth daily with breakfast.      . promethazine (PHENERGAN) 25 MG tablet Take 25 mg by mouth every 6 (six) hours as needed for nausea.      . simvastatin (ZOCOR) 20 MG tablet Take 20 mg by mouth every evening.       No current facility-administered medications for this visit.     Objective: Blood pressure 118/70, pulse 72, temperature 98.1 F (36.7 C), temperature source Oral, resp. rate 18, height 5' 1.5" (1.562 m), weight 150 lb 14.4 oz (68.448 kg). Patient is alert and in no acute distress. Conjunctiva is pink. Sclera is nonicteric Oropharyngeal mucosa is normal. No neck masses or thyromegaly noted. Cardiac exam with regular  rhythm normal S1 and S2. No murmur or gallop noted. Lungs are clear to auscultation. Abdomen is symmetrical. Bowel sounds are normal. On palpation soft abdomen with mild tenderness in both iliac fossae on superficial palpation. No organomegaly or masses. No LE edema or clubbing noted.    Assessment:  #1. Irritable bowel syndrome. She is doing much better with the combination of dicyclomine and Imodium. She needs to make sure that her bowels move daily or every other day. She needs to increase intake of fiber rich foods. #2. Family history of CRC. She had colonoscopy on 12/17/2012. Next screening exam due in October 2019.    Plan:  High fiber diet. Dulcolax suppository on an as-needed  basis. She should consider using suppository if she goes 2 days without a bowel movement so that she would not develop fecal impaction. Keep stool diary for 4-8 weeks and send [place] summary for review. Office visit in 6 months.

## 2013-02-17 NOTE — Patient Instructions (Addendum)
Continue high fiber diet. Can use Dulcolax suppository on an as-needed basis. Stool diary for 4-8 weeks and send [place] summary for review.

## 2013-04-13 ENCOUNTER — Other Ambulatory Visit: Payer: Self-pay | Admitting: Internal Medicine

## 2013-04-13 DIAGNOSIS — R921 Mammographic calcification found on diagnostic imaging of breast: Secondary | ICD-10-CM

## 2013-05-20 ENCOUNTER — Ambulatory Visit (HOSPITAL_COMMUNITY)
Admission: RE | Admit: 2013-05-20 | Discharge: 2013-05-20 | Disposition: A | Discharge status: Home / Self Care | Payer: Medicare Other | Source: Ambulatory Visit | Attending: Internal Medicine | Admitting: Internal Medicine

## 2013-05-20 DIAGNOSIS — R928 Other abnormal and inconclusive findings on diagnostic imaging of breast: Secondary | ICD-10-CM | POA: Insufficient documentation

## 2013-05-20 DIAGNOSIS — R921 Mammographic calcification found on diagnostic imaging of breast: Secondary | ICD-10-CM

## 2013-05-20 DIAGNOSIS — Z09 Encounter for follow-up examination after completed treatment for conditions other than malignant neoplasm: Secondary | ICD-10-CM | POA: Insufficient documentation

## 2013-07-04 ENCOUNTER — Other Ambulatory Visit (INDEPENDENT_AMBULATORY_CARE_PROVIDER_SITE_OTHER): Payer: Self-pay | Admitting: Internal Medicine

## 2013-08-03 ENCOUNTER — Other Ambulatory Visit (INDEPENDENT_AMBULATORY_CARE_PROVIDER_SITE_OTHER): Payer: Self-pay | Admitting: Internal Medicine

## 2013-08-18 ENCOUNTER — Encounter (INDEPENDENT_AMBULATORY_CARE_PROVIDER_SITE_OTHER): Payer: Self-pay | Admitting: Internal Medicine

## 2013-08-18 ENCOUNTER — Ambulatory Visit (INDEPENDENT_AMBULATORY_CARE_PROVIDER_SITE_OTHER): Payer: Medicare Other | Admitting: Internal Medicine

## 2013-08-18 VITALS — BP 112/70 | HR 72 | Temp 97.6°F | Resp 18 | Ht 61.5 in | Wt 144.7 lb

## 2013-08-18 DIAGNOSIS — K589 Irritable bowel syndrome without diarrhea: Secondary | ICD-10-CM

## 2013-08-18 DIAGNOSIS — R110 Nausea: Secondary | ICD-10-CM

## 2013-08-18 DIAGNOSIS — K219 Gastro-esophageal reflux disease without esophagitis: Secondary | ICD-10-CM

## 2013-08-18 MED ORDER — DEXLANSOPRAZOLE 60 MG PO CPDR: 60.0000 mg | DELAYED_RELEASE_CAPSULE | Freq: Every day | ORAL | Status: DC

## 2013-08-18 MED ORDER — ONDANSETRON HCL 4 MG PO TABS: 4.0000 mg | ORAL_TABLET | Freq: Two times a day (BID) | ORAL | Status: DC | PRN

## 2013-08-18 NOTE — Progress Notes (Signed)
Presenting complaint;  Follow for GERD and IBS.  Subjective:  Patient is 68 year old Caucasian female who is here for scheduled visit. She was last seen 6 months ago. She does not feel well. She reports drop in her appetite and she has lost 6 pounds as her last visit. She says she was doing much better while she was in Nexium. It was changed to pantoprazole 3 months ago since her co-pay was very high. She feels pantoprazole is not working. She has multiple episodes of heartburn daily. She also has nausea which is worse in the morning and worse with meals but she gets some relief with milk. She denies vomiting or dysphagia. She is not having diarrhea or urgency. Bowels move daily or every 2-3 days. Occasionally she may use Fleet suppository. She denies melena or rectal bleeding. She does experience intermittent mid abdominal pain which is at times quite sharp but it does not last long. This pain occurs at different times usually 3-4 times a week. She's been using Phenergan intermittently. Few days ago she came disoriented and fell after she took Phenergan.    Current Medications: Outpatient Encounter Prescriptions as of 08/18/2013  Medication Sig  . acetaminophen (TYLENOL) 500 MG tablet Take 500 mg by mouth every 6 (six) hours as needed for pain.  Marland Kitchen albuterol (PROVENTIL HFA;VENTOLIN HFA) 108 (90 BASE) MCG/ACT inhaler Inhale 2 puffs into the lungs every 6 (six) hours as needed for wheezing.  . cetirizine (ZYRTEC) 10 MG tablet Take 10 mg by mouth daily.  Marland Kitchen dicyclomine (BENTYL) 10 MG capsule TAKE ONE CAPSULE BY MOUTH TWICE DAILY BEFORE A MEAL  . DULoxetine (CYMBALTA) 60 MG capsule Take 60 mg by mouth daily.  . Fluticasone-Salmeterol (ADVAIR) 250-50 MCG/DOSE AEPB Inhale 1 puff into the lungs every 12 (twelve) hours.  . gabapentin (NEURONTIN) 600 MG tablet Take 600 mg by mouth 3 (three) times daily.  Marland Kitchen HYDROcodone-acetaminophen (NORCO/VICODIN) 5-325 MG per tablet Take 1 tablet by mouth as needed.   Marland Kitchen  levothyroxine (SYNTHROID, LEVOTHROID) 50 MCG tablet Take 50 mcg by mouth daily before breakfast.  . lisinopril (PRINIVIL,ZESTRIL) 10 MG tablet Take 10 mg by mouth daily.  Marland Kitchen loperamide (IMODIUM) 2 MG capsule Take by mouth. Patient states that she takes this once to twice a day with her Dicyclomine.  . metformin (FORTAMET) 500 MG (OSM) 24 hr tablet Take 500 mg by mouth daily with breakfast.  . pantoprazole (PROTONIX) 40 MG tablet Take 40 mg by mouth 2 (two) times daily.   . promethazine (PHENERGAN) 25 MG tablet Take 25 mg by mouth every 6 (six) hours as needed for nausea.  . simvastatin (ZOCOR) 20 MG tablet Take 20 mg by mouth every evening.  . [DISCONTINUED] esomeprazole (NEXIUM) 40 MG capsule Take 40 mg by mouth daily before breakfast.     Objective: Blood pressure 112/70, pulse 72, temperature 97.6 F (36.4 C), temperature source Oral, resp. rate 18, height 5' 1.5" (1.562 m), weight 144 lb 11.2 oz (65.635 kg). Patient is alert and in no acute distress. Conjunctiva is pink. Sclera is nonicteric Oropharyngeal mucosa is normal. No neck masses or thyromegaly noted. Cardiac exam with regular rhythm normal S1 and S2. No murmur or gallop noted. Lungs are clear to auscultation. Abdomen symmetrical. Bowel sounds are normal. No bruit noted. On palpation abdomen is soft with mild tenderness in right lower quadrant. No organomegaly or masses.  No LE edema or clubbing noted.    Assessment:  #1. GERD. Symptoms not well controlled with pantoprazole. She  was doing well with Nexium but cold there was over $100 she cannot afford. #2. Irritable bowel syndrome. Still having some cramps. #3. Nausea. Nausea could be secondary to poorly controlled GERD symptoms or one of her medications. If the symptoms persist she may need EGD.    Plan:  Request copy of recent blood work from Dr. Trena Platt office. Discontinue pantoprazole and Phenergan. Dexilant 60 mg by mouth every morning. Samples given along with a  prescription. Ondansetron 4 mg by mouth twice a day when necessary. Office visit in 3 months.

## 2013-08-18 NOTE — Patient Instructions (Signed)
Discontinue pantoprazole. Dexilant 60 mg by mouth 30 minutes before breakfast daily. Review recently bloodwork for Dr. Trena Platt office.

## 2013-09-07 ENCOUNTER — Other Ambulatory Visit (INDEPENDENT_AMBULATORY_CARE_PROVIDER_SITE_OTHER): Payer: Self-pay | Admitting: Internal Medicine

## 2013-09-07 NOTE — Telephone Encounter (Signed)
Per Dr.Rehman may fill with 5 refills 

## 2013-11-10 ENCOUNTER — Other Ambulatory Visit: Payer: Self-pay | Admitting: Internal Medicine

## 2013-11-10 DIAGNOSIS — R921 Mammographic calcification found on diagnostic imaging of breast: Secondary | ICD-10-CM

## 2013-11-24 ENCOUNTER — Ambulatory Visit (HOSPITAL_COMMUNITY)
Admission: RE | Admit: 2013-11-24 | Discharge: 2013-11-24 | Disposition: A | Discharge status: Home / Self Care | Payer: Medicare Other | Source: Ambulatory Visit | Attending: Diagnostic Radiology | Admitting: Diagnostic Radiology

## 2013-11-24 DIAGNOSIS — R928 Other abnormal and inconclusive findings on diagnostic imaging of breast: Secondary | ICD-10-CM | POA: Diagnosis present

## 2013-11-24 DIAGNOSIS — R921 Mammographic calcification found on diagnostic imaging of breast: Secondary | ICD-10-CM

## 2013-12-08 ENCOUNTER — Encounter (INDEPENDENT_AMBULATORY_CARE_PROVIDER_SITE_OTHER): Payer: Self-pay | Admitting: Internal Medicine

## 2013-12-08 ENCOUNTER — Ambulatory Visit (INDEPENDENT_AMBULATORY_CARE_PROVIDER_SITE_OTHER): Payer: Medicare Other | Admitting: Internal Medicine

## 2013-12-08 VITALS — BP 110/72 | HR 76 | Temp 98.5°F | Resp 18 | Ht 61.5 in | Wt 142.2 lb

## 2013-12-08 DIAGNOSIS — K219 Gastro-esophageal reflux disease without esophagitis: Secondary | ICD-10-CM

## 2013-12-08 DIAGNOSIS — R110 Nausea: Secondary | ICD-10-CM

## 2013-12-08 DIAGNOSIS — K589 Irritable bowel syndrome without diarrhea: Secondary | ICD-10-CM

## 2013-12-08 NOTE — Progress Notes (Signed)
Presenting complaint;  Followup for GERD, IBS and nausea.  Subjective:  Patient is 43 old Caucasian female who is here for scheduled visit. She was last seen on 08/18/2013. She reports Nexium has worked best for her GERD but her co-pay has been too high. She had good relief with Dexilant but cost was an issue again. Now she is an OTC Nexium twice daily. She is still having 3-4 episodes of heartburn every week. Most of her episodes occur during the daytime. She denies dysphagia. She is having less nausea than before. She takes 2 doses of Imodium every day. She still has periods with diarrhea like she had 12 days ago and has not gone since then. She continues to have cramps across her lower abdomen occurring prior to her bowel movements and quickly relieved with defecation. She denies melena or rectal bleeding. She takes up to 2 g of Tylenol a day. She does not take narcotic every day.   Current Medications: Outpatient Encounter Prescriptions as of 12/08/2013  Medication Sig  . acetaminophen (TYLENOL) 500 MG tablet Take 500 mg by mouth every 6 (six) hours as needed for pain.  Marland Kitchen albuterol (PROVENTIL HFA;VENTOLIN HFA) 108 (90 BASE) MCG/ACT inhaler Inhale 2 puffs into the lungs every 6 (six) hours as needed for wheezing.  . cephALEXin (KEFLEX) 500 MG capsule Take 500 mg by mouth 3 (three) times daily.   . clobetasol cream (TEMOVATE) 6.06 % Apply 1 application topically daily.   Marland Kitchen dicyclomine (BENTYL) 10 MG capsule TAKE ONE CAPSULE BY MOUTH TWICE DAILY BEFORE A MEAL  . DULoxetine (CYMBALTA) 60 MG capsule Take 60 mg by mouth daily.  Marland Kitchen esomeprazole (NEXIUM) 20 MG capsule Take 20 mg by mouth 2 (two) times daily before a meal.  . Fluticasone-Salmeterol (ADVAIR) 250-50 MCG/DOSE AEPB Inhale 1 puff into the lungs every 12 (twelve) hours.  . gabapentin (NEURONTIN) 600 MG tablet Take 600 mg by mouth 3 (three) times daily.  Marland Kitchen HYDROcodone-acetaminophen (NORCO/VICODIN) 5-325 MG per tablet Take 1 tablet by mouth as  needed.   Marland Kitchen levothyroxine (SYNTHROID, LEVOTHROID) 50 MCG tablet Take 50 mcg by mouth daily before breakfast.  . lisinopril (PRINIVIL,ZESTRIL) 10 MG tablet Take 10 mg by mouth daily.  Marland Kitchen loperamide (IMODIUM) 2 MG capsule Take by mouth. Patient states that she takes this once to twice a day with her Dicyclomine.  . metformin (FORTAMET) 500 MG (OSM) 24 hr tablet Take 500 mg by mouth daily with breakfast.  . mupirocin cream (BACTROBAN) 2 % Apply 1 application topically daily.   . ondansetron (ZOFRAN) 4 MG tablet Take 1 tablet (4 mg total) by mouth 2 (two) times daily as needed for nausea or vomiting.  . simvastatin (ZOCOR) 20 MG tablet Take 20 mg by mouth every evening.  . [DISCONTINUED] cetirizine (ZYRTEC) 10 MG tablet Take 10 mg by mouth daily.  . [DISCONTINUED] dexlansoprazole (DEXILANT) 60 MG capsule Take 1 capsule (60 mg total) by mouth daily.     Objective: Blood pressure 110/72, pulse 76, temperature 98.5 F (36.9 C), temperature source Oral, resp. rate 18, height 5' 1.5" (1.562 m), weight 142 lb 3.2 oz (64.501 kg). Patient is alert and in no acute distress. Conjunctiva is pink. Sclera is nonicteric Oropharyngeal mucosa is normal. No neck masses or thyromegaly noted. Cardiac exam with regular rhythm normal S1 and S2. No murmur or gallop noted. Lungs are clear to auscultation. Abdomen symmetrical. Bowel sounds are normal. Abdomen is soft on palpation with mild tenderness in LLQ. No organomegaly or masses.  No LE edema or clubbing noted.     Assessment:  #1. GERD symptoms are poorly controlled with Nexium OTC twice a day. Since she Glycerin having most episodes of heartburn during the daytime she may do better by taking 2 doses every morning. If symptom control is not satisfactory she may need a gastric emptying study to rule out gastroparesis. #2.Irritable bowel syndrome.once again symptom control is not optimal.he remains with pre-defecation abdominal cramps despite taking  dicyclomine. #3. Nausea. Intermittent nausea is most likely secondary to medications.   Plan:   Take both capsules of Nexium OTC 30 minutes prior to breakfast daily.  Fiber supplement 3-4 g by mouth daily. Use  Glycerin or Dulcolax suppository on an as-needed basis if you go two days without a bowel movement. Office visit in 6 months.

## 2013-12-08 NOTE — Patient Instructions (Signed)
Take both OTC Nexium capsules 30 minutes before breakfast daily. Fiber supplement 3-4 g by mouth daily at bedtime. Can use glycerin or Dulcolax suppository on an as-needed basis.

## 2013-12-14 ENCOUNTER — Telehealth (INDEPENDENT_AMBULATORY_CARE_PROVIDER_SITE_OTHER): Payer: Self-pay | Admitting: *Deleted

## 2013-12-14 NOTE — Telephone Encounter (Signed)
Had physical at Dr. Trena Platt office and blood was found in her stool. Would like to let Dr. Laural Golden know but doesn't feel like it is anything to worry about. Heather Espinoza has had trouble with constipation and Dr. Laural Golden has advised her to use suppositories. The return phone number is [phone].

## 2013-12-16 NOTE — Telephone Encounter (Signed)
Butch Penny -Dr.Rehman would like to get labs from another physician. See his note below. Thank You.

## 2013-12-16 NOTE — Telephone Encounter (Signed)
She had colonoscopy in October 2014. Please try to get recent CBC from Dr. Trena Platt office.

## 2013-12-18 NOTE — Telephone Encounter (Signed)
Per Sharyn Lull, the labs are not back at this time.

## 2013-12-25 NOTE — Telephone Encounter (Signed)
Anna at Dr. Trena Platt office is going to see if they can pull the labs from another computer and send them over.

## 2013-12-28 NOTE — Telephone Encounter (Signed)
Received and put back for Dr. Laural Golden to review.

## 2014-02-19 ENCOUNTER — Other Ambulatory Visit (INDEPENDENT_AMBULATORY_CARE_PROVIDER_SITE_OTHER): Payer: Self-pay | Admitting: Internal Medicine

## 2014-03-10 ENCOUNTER — Encounter (INDEPENDENT_AMBULATORY_CARE_PROVIDER_SITE_OTHER): Payer: Self-pay

## 2014-03-19 HISTORY — PX: CATARACT EXTRACTION: SUR2

## 2014-03-25 ENCOUNTER — Other Ambulatory Visit (INDEPENDENT_AMBULATORY_CARE_PROVIDER_SITE_OTHER): Payer: Self-pay | Admitting: Internal Medicine

## 2014-05-08 ENCOUNTER — Other Ambulatory Visit (INDEPENDENT_AMBULATORY_CARE_PROVIDER_SITE_OTHER): Payer: Self-pay | Admitting: Internal Medicine

## 2014-05-13 ENCOUNTER — Telehealth (INDEPENDENT_AMBULATORY_CARE_PROVIDER_SITE_OTHER): Payer: Self-pay | Admitting: *Deleted

## 2014-05-13 NOTE — Telephone Encounter (Signed)
A PA was needed for the Dicyclomine capsule 10 mg. Patient takes 1 by mouth twice daily. The PA was approved per [name] with [employer] from 03/29/14-03/19/15. A letter of approval is to follow to our office.

## 2014-05-18 ENCOUNTER — Other Ambulatory Visit (INDEPENDENT_AMBULATORY_CARE_PROVIDER_SITE_OTHER): Payer: Self-pay | Admitting: Internal Medicine

## 2014-05-18 ENCOUNTER — Ambulatory Visit (INDEPENDENT_AMBULATORY_CARE_PROVIDER_SITE_OTHER): Payer: Medicare Other | Admitting: Internal Medicine

## 2014-06-14 ENCOUNTER — Ambulatory Visit (INDEPENDENT_AMBULATORY_CARE_PROVIDER_SITE_OTHER): Payer: Medicare Other | Admitting: Internal Medicine

## 2014-06-14 ENCOUNTER — Encounter (INDEPENDENT_AMBULATORY_CARE_PROVIDER_SITE_OTHER): Payer: Self-pay | Admitting: Internal Medicine

## 2014-06-14 VITALS — BP 112/76 | HR 70 | Temp 97.9°F | Resp 18 | Ht 61.5 in | Wt 143.7 lb

## 2014-06-14 DIAGNOSIS — K219 Gastro-esophageal reflux disease without esophagitis: Secondary | ICD-10-CM | POA: Diagnosis not present

## 2014-06-14 DIAGNOSIS — R110 Nausea: Secondary | ICD-10-CM

## 2014-06-14 DIAGNOSIS — K589 Irritable bowel syndrome without diarrhea: Secondary | ICD-10-CM

## 2014-06-14 MED ORDER — ONDANSETRON HCL 4 MG PO TABS: 4.0000 mg | ORAL_TABLET | Freq: Two times a day (BID) | ORAL | Status: DC | PRN

## 2014-06-14 NOTE — Patient Instructions (Signed)
Take dicyclomine 10 mg daily every morning. Take second dose on as needed basis.

## 2014-06-14 NOTE — Progress Notes (Signed)
Presenting complaint;  Follow-up for IBS GERD nausea and recent history of diverticulitis.  Subjective:  Patient is 69 year old Caucasian female who presents for scheduled visit. She was last seen in September 2015 for GERD IBS and intermittent nausea. She says she was doing well until a few weeks ago when she became constipated and stopped dicyclomine and loperamide. On 06/02/2014 more or less sudden onset of severe left lower quadrant abdominal pain and was evaluated in the emergency room and diagnosed to have sigmoid diverticulitis based on CT findings. She was given Cipro and Flagyl. She finished Cipro but stop Flagyl after few days. Her abdominal pain has resolved but she still has some soreness. She has noted more nausea over the last 2 weeks. She has not experienced vomiting. She needs new prescription for ondansetron. She is not having 2-3 stools per day. She denies diarrhea and/or constipation. She also denies melena or rectal bleeding. She has not lost any weight since her last visit. Heartburn is well controlled with therapy. She is taking OTC Nexium since her insurance would not cover for prescription Nexium.   Current Medications: Outpatient Encounter Prescriptions as of 06/14/2014  Medication Sig  . acetaminophen (TYLENOL) 500 MG tablet Take 500 mg by mouth every 6 (six) hours as needed for pain.  Marland Kitchen albuterol (PROVENTIL HFA;VENTOLIN HFA) 108 (90 BASE) MCG/ACT inhaler Inhale 2 puffs into the lungs every 6 (six) hours as needed for wheezing.  . DULoxetine (CYMBALTA) 60 MG capsule Take 60 mg by mouth daily.  Marland Kitchen esomeprazole (NEXIUM) 20 MG capsule Take 20 mg by mouth 2 (two) times daily before a meal.  . Fluticasone-Salmeterol (ADVAIR) 250-50 MCG/DOSE AEPB Inhale 1 puff into the lungs every 12 (twelve) hours.  . gabapentin (NEURONTIN) 600 MG tablet Take 600 mg by mouth 3 (three) times daily.  Marland Kitchen HYDROcodone-acetaminophen (NORCO/VICODIN) 5-325 MG per tablet Take 1 tablet by mouth as  needed.   Marland Kitchen levothyroxine (SYNTHROID, LEVOTHROID) 50 MCG tablet Take 50 mcg by mouth daily before breakfast.  . lisinopril (PRINIVIL,ZESTRIL) 10 MG tablet Take 10 mg by mouth daily.  . ondansetron (ZOFRAN) 4 MG tablet TAKE 1 TABLET BY MOUTH TWICE DAILY AS NEEDED FOR NAUSEA OR VOMITING  . PREMARIN vaginal cream Place 1 Applicatorful vaginally at bedtime.   . simvastatin (ZOCOR) 20 MG tablet Take 20 mg by mouth every evening.  . dicyclomine (BENTYL) 10 MG capsule TAKE ONE CAPSULE BY MOUTH TWICE DAILY BEFORE A MEAL (Patient not taking: Reported on 06/14/2014)  . loperamide (IMODIUM) 2 MG capsule Take by mouth. Patient states that she takes this once to twice a day with her Dicyclomine.  . metformin (FORTAMET) 500 MG (OSM) 24 hr tablet Take 500 mg by mouth daily with breakfast.  . [DISCONTINUED] cephALEXin (KEFLEX) 500 MG capsule Take 500 mg by mouth 3 (three) times daily.   . [DISCONTINUED] clobetasol cream (TEMOVATE) 8.67 % Apply 1 application topically daily.   . [DISCONTINUED] mupirocin cream (BACTROBAN) 2 % Apply 1 application topically daily.   . [DISCONTINUED] ondansetron (ZOFRAN) 4 MG tablet TAKE 1 TABLET BY MOUTH TWICE DAILY AS NEEDED FOR NAUSEA OR VOMITING (Patient not taking: Reported on 06/14/2014)     Objective: Blood pressure 112/76, pulse 70, temperature 97.9 F (36.6 C), temperature source Oral, resp. rate 18, height 5' 1.5" (1.562 m), weight 143 lb 11.2 oz (65.182 kg). Patient is alert and in no acute distress. Conjunctiva is pink. Sclera is nonicteric Oropharyngeal mucosa is normal. No neck masses or thyromegaly noted. Cardiac exam with  regular rhythm normal S1 and S2. No murmur or gallop noted. Lungs are clear to auscultation. Abdomen. Abdomen is symmetrical. Bowel sounds are hyperactive. On palpation abdomen is soft with mild tenderness in left lower quadrant. No organomegaly or masses.  No LE edema or clubbing noted.  Labs/studies Results:   abdominopelvic CT films from  06/02/2014 reviewed.  Assessment:  #1. Irritable bowel syndrome. She is still having lower abdominal discomfort and will benefit from low-dose dicyclomine on schedule. #2. GERD. Heartburns well-controlled with therapy. #3. Nausea. Nausea may be secondary to one of her medications. #4. History of sigmoid diverticulitis for which she was treated and appears to have responded to therapy.  Plan:  Continue high fiber diet and gummie fiber daily. Take dicyclomine 10 mg before breakfast daily and second dose on as-needed basis. New prescription for ondansetron given. Office visit in 6 months.

## 2014-09-14 ENCOUNTER — Other Ambulatory Visit (INDEPENDENT_AMBULATORY_CARE_PROVIDER_SITE_OTHER): Payer: Self-pay | Admitting: Internal Medicine

## 2014-10-15 ENCOUNTER — Other Ambulatory Visit (INDEPENDENT_AMBULATORY_CARE_PROVIDER_SITE_OTHER): Payer: Self-pay | Admitting: Internal Medicine

## 2014-11-03 ENCOUNTER — Other Ambulatory Visit (HOSPITAL_COMMUNITY): Payer: Self-pay | Admitting: Internal Medicine

## 2014-11-03 DIAGNOSIS — Z1231 Encounter for screening mammogram for malignant neoplasm of breast: Secondary | ICD-10-CM

## 2014-11-16 ENCOUNTER — Other Ambulatory Visit (INDEPENDENT_AMBULATORY_CARE_PROVIDER_SITE_OTHER): Payer: Self-pay | Admitting: Internal Medicine

## 2014-11-29 ENCOUNTER — Other Ambulatory Visit (HOSPITAL_COMMUNITY): Payer: Self-pay | Admitting: Internal Medicine

## 2014-11-29 DIAGNOSIS — Z09 Encounter for follow-up examination after completed treatment for conditions other than malignant neoplasm: Secondary | ICD-10-CM

## 2014-12-06 ENCOUNTER — Ambulatory Visit (HOSPITAL_COMMUNITY): Payer: Medicare Other

## 2014-12-07 ENCOUNTER — Encounter (HOSPITAL_COMMUNITY): Payer: Medicare Other

## 2014-12-14 ENCOUNTER — Encounter (HOSPITAL_COMMUNITY): Payer: Medicare Other

## 2014-12-21 ENCOUNTER — Encounter (HOSPITAL_COMMUNITY): Payer: Medicare Other

## 2014-12-27 ENCOUNTER — Ambulatory Visit (INDEPENDENT_AMBULATORY_CARE_PROVIDER_SITE_OTHER): Payer: Medicare Other | Admitting: Internal Medicine

## 2014-12-28 ENCOUNTER — Ambulatory Visit (INDEPENDENT_AMBULATORY_CARE_PROVIDER_SITE_OTHER): Payer: Medicare Other | Admitting: Internal Medicine

## 2015-01-04 ENCOUNTER — Ambulatory Visit (INDEPENDENT_AMBULATORY_CARE_PROVIDER_SITE_OTHER): Payer: Medicare Other | Admitting: Internal Medicine

## 2015-01-04 ENCOUNTER — Ambulatory Visit (HOSPITAL_COMMUNITY)
Admission: RE | Admit: 2015-01-04 | Discharge: 2015-01-04 | Disposition: A | Discharge status: Home / Self Care | Payer: Medicare Other | Source: Ambulatory Visit | Attending: Internal Medicine | Admitting: Internal Medicine

## 2015-01-04 ENCOUNTER — Encounter (INDEPENDENT_AMBULATORY_CARE_PROVIDER_SITE_OTHER): Payer: Self-pay | Admitting: Internal Medicine

## 2015-01-04 VITALS — BP 130/80 | HR 68 | Temp 97.6°F | Resp 18 | Ht 61.5 in | Wt 141.8 lb

## 2015-01-04 DIAGNOSIS — K589 Irritable bowel syndrome without diarrhea: Secondary | ICD-10-CM

## 2015-01-04 DIAGNOSIS — R110 Nausea: Secondary | ICD-10-CM

## 2015-01-04 DIAGNOSIS — Z09 Encounter for follow-up examination after completed treatment for conditions other than malignant neoplasm: Secondary | ICD-10-CM

## 2015-01-04 DIAGNOSIS — R921 Mammographic calcification found on diagnostic imaging of breast: Secondary | ICD-10-CM | POA: Diagnosis present

## 2015-01-04 DIAGNOSIS — K219 Gastro-esophageal reflux disease without esophagitis: Secondary | ICD-10-CM

## 2015-01-04 MED ORDER — BISACODYL 10 MG RE SUPP: 10.0000 mg | RECTAL | Status: DC | PRN

## 2015-01-04 MED ORDER — DOCUSATE SODIUM 100 MG PO CAPS: 200.0000 mg | ORAL_CAPSULE | Freq: Every day | ORAL | Status: DC

## 2015-01-04 MED ORDER — ONDANSETRON HCL 4 MG PO TABS: 4.0000 mg | ORAL_TABLET | Freq: Two times a day (BID) | ORAL | Status: DC | PRN

## 2015-01-04 MED ORDER — DICYCLOMINE HCL 10 MG PO CAPS
10.0000 mg | ORAL_CAPSULE | Freq: Two times a day (BID) | ORAL | Status: DC | PRN
Start: 2015-01-04 — End: 2016-12-04

## 2015-01-04 NOTE — Patient Instructions (Signed)
Can use Dulcolax suppository every other day on as-needed basis.

## 2015-01-04 NOTE — Progress Notes (Signed)
Presenting complaint;  Follow-up for GERD IBS and nausea.  Subjective:  Patient is 69 year old Caucasian female who is here for scheduled visit. She was last seen on 06/14/2014. She says dicyclomine has helped with abdominal cramps but she became constipated and therefore is not taking it on schedule. She also is not taking fiber gummy's as recommended. Lately she's having more problems with constipation and may go 3-4 days without a bowel movement. She uses Dulcolax tablet on as-needed basis. She says Nexium is working and she rarely has heartburn. She denies melena or rectal bleeding. Lately she has not taken Imodium because she has not had diarrhea. She does not take a medication daily. She is not having any side effects with Nexium.    Current Medications: Outpatient Encounter Prescriptions as of 01/04/2015  Medication Sig  . acetaminophen (TYLENOL) 500 MG tablet Take 500 mg by mouth every 6 (six) hours as needed for pain.  Marland Kitchen aspirin EC 81 MG tablet Take 81 mg by mouth daily.  Marland Kitchen dicyclomine (BENTYL) 10 MG capsule TAKE ONE CAPSULE BY MOUTH TWICE DAILY BEFORE A MEAL  . DULoxetine (CYMBALTA) 60 MG capsule Take 60 mg by mouth daily.  Marland Kitchen esomeprazole (NEXIUM) 20 MG capsule Take 20 mg by mouth 2 (two) times daily before a meal.  . Fluticasone-Salmeterol (ADVAIR) 250-50 MCG/DOSE AEPB Inhale 1 puff into the lungs every 12 (twelve) hours.  . gabapentin (NEURONTIN) 600 MG tablet Take 600 mg by mouth 3 (three) times daily.  Marland Kitchen HYDROcodone-acetaminophen (NORCO/VICODIN) 5-325 MG per tablet Take 1 tablet by mouth as needed.   Marland Kitchen levothyroxine (SYNTHROID, LEVOTHROID) 50 MCG tablet Take 50 mcg by mouth daily before breakfast.  . lisinopril (PRINIVIL,ZESTRIL) 10 MG tablet Take 10 mg by mouth daily.  Marland Kitchen loperamide (IMODIUM) 2 MG capsule Take 2 mg by mouth 2 (two) times daily as needed.  . metformin (FORTAMET) 500 MG (OSM) 24 hr tablet Take 500 mg by mouth daily with breakfast.  . ondansetron (ZOFRAN) 4 MG  tablet Take 1 tablet (4 mg total) by mouth 2 (two) times daily as needed for nausea or vomiting.  . simvastatin (ZOCOR) 20 MG tablet Take 20 mg by mouth every evening.  . traZODone (DESYREL) 50 MG tablet Take 50 mg by mouth at bedtime.   . [DISCONTINUED] ondansetron (ZOFRAN) 4 MG tablet TAKE ONE TABLET BY MOUTH TWICE DAILY AS NEEDED FOR NAUSEA AND VOMITING  . albuterol (PROVENTIL HFA;VENTOLIN HFA) 108 (90 BASE) MCG/ACT inhaler Inhale 2 puffs into the lungs every 6 (six) hours as needed for wheezing.  . [DISCONTINUED] PREMARIN vaginal cream Place 1 Applicatorful vaginally at bedtime.    No facility-administered encounter medications on file as of 01/04/2015.     Objective: Blood pressure 130/80, pulse 68, temperature 97.6 F (36.4 C), temperature source Oral, resp. rate 18, height 5' 1.5" (1.562 m), weight 141 lb 12.8 oz (64.32 kg). Patient is alert and in no acute distress. Conjunctiva is pink. Sclera is nonicteric Oropharyngeal mucosa is normal. No neck masses or thyromegaly noted. Cardiac exam with regular rhythm normal S1 and S2. No murmur or gallop noted. Lungs are clear to auscultation. Abdomen is symmetrical. Bowel sounds are normal. On palpation abdomen is soft with mild generalized tenderness but no guarding or organomegaly. No LE edema or clubbing noted.   Assessment:  #1. GERD. She is doing well with anti-reflex measures and PPI. If she continues to do well will consider dropping PPI dose on her next visit. #2. Irritable bowel syndrome. She develops constipation  when she takes dicyclomine on schedule. It does help with cramps. Therefore she can use it on demand. #3. Nausea without vomiting. This symptom is most likely secondary to her medications or central in origin. She is getting relief with when necessary ondansetron.   Plan:  Patient advised to take fiber supplement every day. She should use dicyclomine 10 mg twice a day when necessary. Patient encouraged to use  Dulcolax suppository every other day or on as-needed basis in order to prevent fecal impaction. Office visit in 6 months.

## 2015-04-25 DIAGNOSIS — M19031 Primary osteoarthritis, right wrist: Secondary | ICD-10-CM | POA: Diagnosis not present

## 2015-04-25 DIAGNOSIS — Z6826 Body mass index (BMI) 26.0-26.9, adult: Secondary | ICD-10-CM | POA: Diagnosis not present

## 2015-04-25 DIAGNOSIS — M85831 Other specified disorders of bone density and structure, right forearm: Secondary | ICD-10-CM | POA: Diagnosis not present

## 2015-04-25 DIAGNOSIS — Z789 Other specified health status: Secondary | ICD-10-CM | POA: Diagnosis not present

## 2015-04-25 DIAGNOSIS — K5900 Constipation, unspecified: Secondary | ICD-10-CM | POA: Diagnosis not present

## 2015-04-25 DIAGNOSIS — R109 Unspecified abdominal pain: Secondary | ICD-10-CM | POA: Diagnosis not present

## 2015-04-26 ENCOUNTER — Other Ambulatory Visit (INDEPENDENT_AMBULATORY_CARE_PROVIDER_SITE_OTHER): Payer: Self-pay | Admitting: Internal Medicine

## 2015-05-17 DIAGNOSIS — E7800 Pure hypercholesterolemia, unspecified: Secondary | ICD-10-CM | POA: Diagnosis not present

## 2015-05-17 DIAGNOSIS — M19039 Primary osteoarthritis, unspecified wrist: Secondary | ICD-10-CM | POA: Diagnosis not present

## 2015-05-17 DIAGNOSIS — E1142 Type 2 diabetes mellitus with diabetic polyneuropathy: Secondary | ICD-10-CM | POA: Diagnosis not present

## 2015-05-17 DIAGNOSIS — K589 Irritable bowel syndrome without diarrhea: Secondary | ICD-10-CM | POA: Diagnosis not present

## 2015-05-20 ENCOUNTER — Other Ambulatory Visit (INDEPENDENT_AMBULATORY_CARE_PROVIDER_SITE_OTHER): Payer: Self-pay | Admitting: Internal Medicine

## 2015-05-20 DIAGNOSIS — E119 Type 2 diabetes mellitus without complications: Secondary | ICD-10-CM | POA: Diagnosis not present

## 2015-05-20 DIAGNOSIS — I10 Essential (primary) hypertension: Secondary | ICD-10-CM | POA: Diagnosis not present

## 2015-06-07 DIAGNOSIS — E1140 Type 2 diabetes mellitus with diabetic neuropathy, unspecified: Secondary | ICD-10-CM | POA: Diagnosis not present

## 2015-06-07 DIAGNOSIS — M1811 Unilateral primary osteoarthritis of first carpometacarpal joint, right hand: Secondary | ICD-10-CM | POA: Diagnosis not present

## 2015-06-07 DIAGNOSIS — E1151 Type 2 diabetes mellitus with diabetic peripheral angiopathy without gangrene: Secondary | ICD-10-CM | POA: Diagnosis not present

## 2015-06-07 DIAGNOSIS — M25579 Pain in unspecified ankle and joints of unspecified foot: Secondary | ICD-10-CM | POA: Diagnosis not present

## 2015-06-07 DIAGNOSIS — M79641 Pain in right hand: Secondary | ICD-10-CM | POA: Diagnosis not present

## 2015-06-07 DIAGNOSIS — L600 Ingrowing nail: Secondary | ICD-10-CM | POA: Diagnosis not present

## 2015-06-15 ENCOUNTER — Other Ambulatory Visit (INDEPENDENT_AMBULATORY_CARE_PROVIDER_SITE_OTHER): Payer: Self-pay | Admitting: Internal Medicine

## 2015-06-20 DIAGNOSIS — I10 Essential (primary) hypertension: Secondary | ICD-10-CM | POA: Diagnosis not present

## 2015-06-20 DIAGNOSIS — R198 Other specified symptoms and signs involving the digestive system and abdomen: Secondary | ICD-10-CM | POA: Diagnosis not present

## 2015-06-20 DIAGNOSIS — R109 Unspecified abdominal pain: Secondary | ICD-10-CM | POA: Diagnosis not present

## 2015-06-20 DIAGNOSIS — R350 Frequency of micturition: Secondary | ICD-10-CM | POA: Diagnosis not present

## 2015-06-22 DIAGNOSIS — E119 Type 2 diabetes mellitus without complications: Secondary | ICD-10-CM | POA: Diagnosis not present

## 2015-06-22 DIAGNOSIS — K5732 Diverticulitis of large intestine without perforation or abscess without bleeding: Secondary | ICD-10-CM | POA: Diagnosis not present

## 2015-06-23 DIAGNOSIS — Z7982 Long term (current) use of aspirin: Secondary | ICD-10-CM | POA: Diagnosis not present

## 2015-06-23 DIAGNOSIS — M1990 Unspecified osteoarthritis, unspecified site: Secondary | ICD-10-CM | POA: Diagnosis present

## 2015-06-23 DIAGNOSIS — E785 Hyperlipidemia, unspecified: Secondary | ICD-10-CM | POA: Diagnosis present

## 2015-06-23 DIAGNOSIS — Z9109 Other allergy status, other than to drugs and biological substances: Secondary | ICD-10-CM | POA: Diagnosis not present

## 2015-06-23 DIAGNOSIS — Z91040 Latex allergy status: Secondary | ICD-10-CM | POA: Diagnosis not present

## 2015-06-23 DIAGNOSIS — Z882 Allergy status to sulfonamides status: Secondary | ICD-10-CM | POA: Diagnosis not present

## 2015-06-23 DIAGNOSIS — Z881 Allergy status to other antibiotic agents status: Secondary | ICD-10-CM | POA: Diagnosis not present

## 2015-06-23 DIAGNOSIS — J309 Allergic rhinitis, unspecified: Secondary | ICD-10-CM | POA: Diagnosis present

## 2015-06-23 DIAGNOSIS — Z888 Allergy status to other drugs, medicaments and biological substances status: Secondary | ICD-10-CM | POA: Diagnosis not present

## 2015-06-23 DIAGNOSIS — G2581 Restless legs syndrome: Secondary | ICD-10-CM | POA: Diagnosis present

## 2015-06-23 DIAGNOSIS — K5732 Diverticulitis of large intestine without perforation or abscess without bleeding: Secondary | ICD-10-CM | POA: Diagnosis not present

## 2015-06-23 DIAGNOSIS — Z7951 Long term (current) use of inhaled steroids: Secondary | ICD-10-CM | POA: Diagnosis not present

## 2015-06-23 DIAGNOSIS — E039 Hypothyroidism, unspecified: Secondary | ICD-10-CM | POA: Diagnosis present

## 2015-06-23 DIAGNOSIS — Z886 Allergy status to analgesic agent status: Secondary | ICD-10-CM | POA: Diagnosis not present

## 2015-06-23 DIAGNOSIS — Z79899 Other long term (current) drug therapy: Secondary | ICD-10-CM | POA: Diagnosis not present

## 2015-06-23 DIAGNOSIS — Z7984 Long term (current) use of oral hypoglycemic drugs: Secondary | ICD-10-CM | POA: Diagnosis not present

## 2015-06-23 DIAGNOSIS — M797 Fibromyalgia: Secondary | ICD-10-CM | POA: Diagnosis present

## 2015-06-23 DIAGNOSIS — E119 Type 2 diabetes mellitus without complications: Secondary | ICD-10-CM | POA: Diagnosis not present

## 2015-06-23 DIAGNOSIS — J449 Chronic obstructive pulmonary disease, unspecified: Secondary | ICD-10-CM | POA: Diagnosis present

## 2015-06-23 DIAGNOSIS — Z887 Allergy status to serum and vaccine status: Secondary | ICD-10-CM | POA: Diagnosis not present

## 2015-06-23 DIAGNOSIS — I10 Essential (primary) hypertension: Secondary | ICD-10-CM | POA: Diagnosis not present

## 2015-06-30 DIAGNOSIS — I10 Essential (primary) hypertension: Secondary | ICD-10-CM | POA: Diagnosis not present

## 2015-06-30 DIAGNOSIS — E1142 Type 2 diabetes mellitus with diabetic polyneuropathy: Secondary | ICD-10-CM | POA: Diagnosis not present

## 2015-06-30 DIAGNOSIS — K645 Perianal venous thrombosis: Secondary | ICD-10-CM | POA: Diagnosis not present

## 2015-06-30 DIAGNOSIS — Z299 Encounter for prophylactic measures, unspecified: Secondary | ICD-10-CM | POA: Diagnosis not present

## 2015-06-30 DIAGNOSIS — K529 Noninfective gastroenteritis and colitis, unspecified: Secondary | ICD-10-CM | POA: Diagnosis not present

## 2015-07-07 DIAGNOSIS — M797 Fibromyalgia: Secondary | ICD-10-CM | POA: Diagnosis not present

## 2015-07-07 DIAGNOSIS — M79641 Pain in right hand: Secondary | ICD-10-CM | POA: Diagnosis not present

## 2015-07-07 DIAGNOSIS — M654 Radial styloid tenosynovitis [de Quervain]: Secondary | ICD-10-CM | POA: Diagnosis not present

## 2015-07-07 DIAGNOSIS — M1811 Unilateral primary osteoarthritis of first carpometacarpal joint, right hand: Secondary | ICD-10-CM | POA: Diagnosis not present

## 2015-07-19 ENCOUNTER — Telehealth (INDEPENDENT_AMBULATORY_CARE_PROVIDER_SITE_OTHER): Payer: Self-pay | Admitting: Internal Medicine

## 2015-07-19 ENCOUNTER — Ambulatory Visit (INDEPENDENT_AMBULATORY_CARE_PROVIDER_SITE_OTHER): Payer: Medicare Other | Admitting: Internal Medicine

## 2015-07-19 NOTE — Telephone Encounter (Signed)
Dr.Rehman was made aware. 

## 2015-07-19 NOTE — Telephone Encounter (Signed)
Dr.Rehman ask that we get the hospital records from the hospital where she was admitted for his review.

## 2015-07-19 NOTE — Telephone Encounter (Signed)
Patient had an appointment scheduled for this morning, but called and cancelled.  She stated that her husband is in the hospital and she couldn't come today.  She agreed to see Karna Christmas because she wants to be sooner than August.  I scheduled her an appointment for 07/25/15 with Terri.  She wanted to let Dr. Laural Golden to know that she was in the hospital the first part of April for a bad case of diverticulitis, she wants a colonoscopy ASAP.  She also wants to discuss with Dr. Laural Golden a surgery she is wanting to have, she wants to get his opinion on the surgery.  [phone]

## 2015-07-20 NOTE — Telephone Encounter (Signed)
Called patient, LMOAM that she will need to bring the hospital records with her to her appointment on Monday, 07/25/15 with Terri.

## 2015-07-24 ENCOUNTER — Other Ambulatory Visit (INDEPENDENT_AMBULATORY_CARE_PROVIDER_SITE_OTHER): Payer: Self-pay | Admitting: Internal Medicine

## 2015-07-25 ENCOUNTER — Ambulatory Visit (INDEPENDENT_AMBULATORY_CARE_PROVIDER_SITE_OTHER): Payer: Medicare Other | Admitting: Internal Medicine

## 2015-07-25 NOTE — Telephone Encounter (Signed)
Patient called and left a message this morning to cancel her appointment for today, 07/25/15, appointment cancelled

## 2015-07-28 DIAGNOSIS — E119 Type 2 diabetes mellitus without complications: Secondary | ICD-10-CM | POA: Diagnosis not present

## 2015-07-28 DIAGNOSIS — H43811 Vitreous degeneration, right eye: Secondary | ICD-10-CM | POA: Diagnosis not present

## 2015-07-28 DIAGNOSIS — H538 Other visual disturbances: Secondary | ICD-10-CM | POA: Diagnosis not present

## 2015-08-11 DIAGNOSIS — E119 Type 2 diabetes mellitus without complications: Secondary | ICD-10-CM | POA: Diagnosis not present

## 2015-08-11 DIAGNOSIS — I10 Essential (primary) hypertension: Secondary | ICD-10-CM | POA: Diagnosis not present

## 2015-08-16 DIAGNOSIS — Z800 Family history of malignant neoplasm of digestive organs: Secondary | ICD-10-CM | POA: Diagnosis not present

## 2015-08-16 DIAGNOSIS — K5732 Diverticulitis of large intestine without perforation or abscess without bleeding: Secondary | ICD-10-CM | POA: Diagnosis not present

## 2015-08-19 ENCOUNTER — Other Ambulatory Visit (INDEPENDENT_AMBULATORY_CARE_PROVIDER_SITE_OTHER): Payer: Self-pay | Admitting: Internal Medicine

## 2015-08-22 DIAGNOSIS — Z90710 Acquired absence of both cervix and uterus: Secondary | ICD-10-CM | POA: Diagnosis not present

## 2015-08-22 DIAGNOSIS — Z887 Allergy status to serum and vaccine status: Secondary | ICD-10-CM | POA: Diagnosis not present

## 2015-08-22 DIAGNOSIS — L600 Ingrowing nail: Secondary | ICD-10-CM | POA: Diagnosis not present

## 2015-08-22 DIAGNOSIS — Z800 Family history of malignant neoplasm of digestive organs: Secondary | ICD-10-CM | POA: Diagnosis not present

## 2015-08-22 DIAGNOSIS — Z91040 Latex allergy status: Secondary | ICD-10-CM | POA: Diagnosis not present

## 2015-08-22 DIAGNOSIS — M25579 Pain in unspecified ankle and joints of unspecified foot: Secondary | ICD-10-CM | POA: Diagnosis not present

## 2015-08-22 DIAGNOSIS — G2581 Restless legs syndrome: Secondary | ICD-10-CM | POA: Diagnosis not present

## 2015-08-22 DIAGNOSIS — E785 Hyperlipidemia, unspecified: Secondary | ICD-10-CM | POA: Diagnosis not present

## 2015-08-22 DIAGNOSIS — E1151 Type 2 diabetes mellitus with diabetic peripheral angiopathy without gangrene: Secondary | ICD-10-CM | POA: Diagnosis not present

## 2015-08-22 DIAGNOSIS — J449 Chronic obstructive pulmonary disease, unspecified: Secondary | ICD-10-CM | POA: Diagnosis not present

## 2015-08-22 DIAGNOSIS — K5730 Diverticulosis of large intestine without perforation or abscess without bleeding: Secondary | ICD-10-CM | POA: Diagnosis not present

## 2015-08-22 DIAGNOSIS — Z888 Allergy status to other drugs, medicaments and biological substances status: Secondary | ICD-10-CM | POA: Diagnosis not present

## 2015-08-22 DIAGNOSIS — Z7982 Long term (current) use of aspirin: Secondary | ICD-10-CM | POA: Diagnosis not present

## 2015-08-22 DIAGNOSIS — Z883 Allergy status to other anti-infective agents status: Secondary | ICD-10-CM | POA: Diagnosis not present

## 2015-08-22 DIAGNOSIS — Z7984 Long term (current) use of oral hypoglycemic drugs: Secondary | ICD-10-CM | POA: Diagnosis not present

## 2015-08-22 DIAGNOSIS — Z91048 Other nonmedicinal substance allergy status: Secondary | ICD-10-CM | POA: Diagnosis not present

## 2015-08-22 DIAGNOSIS — K5732 Diverticulitis of large intestine without perforation or abscess without bleeding: Secondary | ICD-10-CM | POA: Diagnosis not present

## 2015-08-22 DIAGNOSIS — E119 Type 2 diabetes mellitus without complications: Secondary | ICD-10-CM | POA: Diagnosis not present

## 2015-08-22 DIAGNOSIS — I10 Essential (primary) hypertension: Secondary | ICD-10-CM | POA: Diagnosis not present

## 2015-08-22 DIAGNOSIS — E039 Hypothyroidism, unspecified: Secondary | ICD-10-CM | POA: Diagnosis not present

## 2015-08-22 DIAGNOSIS — M797 Fibromyalgia: Secondary | ICD-10-CM | POA: Diagnosis not present

## 2015-08-22 DIAGNOSIS — Z882 Allergy status to sulfonamides status: Secondary | ICD-10-CM | POA: Diagnosis not present

## 2015-08-22 DIAGNOSIS — Z9049 Acquired absence of other specified parts of digestive tract: Secondary | ICD-10-CM | POA: Diagnosis not present

## 2015-08-22 DIAGNOSIS — Z79899 Other long term (current) drug therapy: Secondary | ICD-10-CM | POA: Diagnosis not present

## 2015-08-22 DIAGNOSIS — Z886 Allergy status to analgesic agent status: Secondary | ICD-10-CM | POA: Diagnosis not present

## 2015-08-22 DIAGNOSIS — M1990 Unspecified osteoarthritis, unspecified site: Secondary | ICD-10-CM | POA: Diagnosis not present

## 2015-08-22 DIAGNOSIS — Z7951 Long term (current) use of inhaled steroids: Secondary | ICD-10-CM | POA: Diagnosis not present

## 2015-08-22 DIAGNOSIS — E1140 Type 2 diabetes mellitus with diabetic neuropathy, unspecified: Secondary | ICD-10-CM | POA: Diagnosis not present

## 2015-08-23 DIAGNOSIS — M79641 Pain in right hand: Secondary | ICD-10-CM | POA: Diagnosis not present

## 2015-08-23 DIAGNOSIS — M1811 Unilateral primary osteoarthritis of first carpometacarpal joint, right hand: Secondary | ICD-10-CM | POA: Diagnosis not present

## 2015-08-23 DIAGNOSIS — M797 Fibromyalgia: Secondary | ICD-10-CM | POA: Diagnosis not present

## 2015-08-23 DIAGNOSIS — M654 Radial styloid tenosynovitis [de Quervain]: Secondary | ICD-10-CM | POA: Diagnosis not present

## 2015-08-23 DIAGNOSIS — E1142 Type 2 diabetes mellitus with diabetic polyneuropathy: Secondary | ICD-10-CM | POA: Diagnosis not present

## 2015-08-23 DIAGNOSIS — E7800 Pure hypercholesterolemia, unspecified: Secondary | ICD-10-CM | POA: Diagnosis not present

## 2015-08-23 DIAGNOSIS — M25559 Pain in unspecified hip: Secondary | ICD-10-CM | POA: Diagnosis not present

## 2015-08-23 DIAGNOSIS — J449 Chronic obstructive pulmonary disease, unspecified: Secondary | ICD-10-CM | POA: Diagnosis not present

## 2015-09-22 DIAGNOSIS — Z800 Family history of malignant neoplasm of digestive organs: Secondary | ICD-10-CM | POA: Diagnosis not present

## 2015-09-22 DIAGNOSIS — K5732 Diverticulitis of large intestine without perforation or abscess without bleeding: Secondary | ICD-10-CM | POA: Diagnosis not present

## 2015-10-04 DIAGNOSIS — M79672 Pain in left foot: Secondary | ICD-10-CM | POA: Diagnosis not present

## 2015-10-04 DIAGNOSIS — M79671 Pain in right foot: Secondary | ICD-10-CM | POA: Diagnosis not present

## 2015-10-04 DIAGNOSIS — M722 Plantar fascial fibromatosis: Secondary | ICD-10-CM | POA: Diagnosis not present

## 2015-10-04 DIAGNOSIS — M25579 Pain in unspecified ankle and joints of unspecified foot: Secondary | ICD-10-CM | POA: Diagnosis not present

## 2015-10-07 ENCOUNTER — Encounter: Payer: Self-pay | Admitting: Neurology

## 2015-10-07 ENCOUNTER — Ambulatory Visit (INDEPENDENT_AMBULATORY_CARE_PROVIDER_SITE_OTHER): Payer: Medicare Other | Admitting: Neurology

## 2015-10-07 VITALS — BP 131/69 | HR 73 | Ht 61.5 in | Wt 142.5 lb

## 2015-10-07 DIAGNOSIS — G8929 Other chronic pain: Secondary | ICD-10-CM

## 2015-10-07 DIAGNOSIS — M542 Cervicalgia: Secondary | ICD-10-CM

## 2015-10-07 DIAGNOSIS — M545 Low back pain, unspecified: Secondary | ICD-10-CM | POA: Insufficient documentation

## 2015-10-07 DIAGNOSIS — M797 Fibromyalgia: Secondary | ICD-10-CM

## 2015-10-07 MED ORDER — DULOXETINE HCL 30 MG PO CPEP: 30.0000 mg | ORAL_CAPSULE | Freq: Every day | ORAL | Status: DC

## 2015-10-07 NOTE — Patient Instructions (Signed)

## 2015-10-07 NOTE — Progress Notes (Signed)
Reason for visit: Fibromyalgia  Referring physician: Dr. Case  Heather Espinoza is a 70 y.o. female  History of present illness:  Heather Espinoza is a 70 year old right-handed white female with a history of fibromyalgia that was diagnosed about 5 years ago. The patient has had ongoing discomfort in the neck, shoulders, back, and legs. The patient has discomfort down both arms to about the elbow level. The patient has some left hip discomfort and pain down to the left knee area. The patient has been seen through orthopedic surgery, and she is referred to this office for further evaluation. The patient is not clear that she's had any CT or MRI evaluation of the neck or low back. The patient has been on Cymbalta and gabapentin for a number of years with modest benefit. She has irritable bowel syndrome as well and takes Linzess for this. The patient has felt more discomfort as time has gone on. The patient feels weak in the legs at times, she denies any falls. She reports some numbness in the legs and feet. She indicates that she sleeps fairly well, she denies any severe balance troubles, but she does have some imbalance. She has some trouble with urinary urgency and incontinence. Due to the ongoing discomfort, she is sent to this office for further evaluation.  Past Medical History  Diagnosis Date  . Hypertension     4-5 yrs  . Diabetes ([place])     type 2 x 18 months.  Marland Kitchen COPD (chronic obstructive pulmonary disease) ([place])   . Fibromyalgia   . IBS (irritable bowel syndrome)   . Diverticulosis   . Sleep apnea   . Restless leg   . Hypothyroid   . High cholesterol   . Osteopenia   . Kidney stone     Left Kidney  . Diverticulitis   . UTI (lower urinary tract infection)   . Arthritis   . Tendonitis     Right Wrist  . Plantar fasciitis     Past Surgical History  Procedure Laterality Date  . Complete hysterectomy    . Appendectomy    . Cholecystectomy    . Tonsillectomy    . Rt elbow surgery     . Heel tumor removed    . Colonoscopy N/A 12/17/2012    Procedure: COLONOSCOPY;  Surgeon: Rogene Houston, MD;  Location: [facility];  Service: Endoscopy;  Laterality: N/A;  215  . Breast lumpectomy Right 2001  . Cataract extraction Bilateral 2016    Family History  Problem Relation Age of Onset  . Uterine cancer Mother   . Colon cancer Brother   . Lung cancer Brother   . COPD Sister   . Diabetes Grandchild   . Asthma Son   . Parkinson's disease Father   . Stroke Father     Social history:  reports that she has never smoked. She has never used smokeless tobacco. She reports that she does not drink alcohol or use illicit drugs.  Medications:  Prior to Admission medications   Medication Sig Start Date End Date Taking? Authorizing Provider  acetaminophen (TYLENOL) 500 MG tablet Take 500 mg by mouth every 6 (six) hours as needed for pain.   Yes Historical Provider, MD  aspirin EC 81 MG tablet Take 81 mg by mouth daily.   Yes Historical Provider, MD  Biotin 5000 MCG CAPS Take by mouth.   Yes Historical Provider, MD  bisacodyl (DULCOLAX) 10 MG suppository Place 1 suppository (10 mg  total) rectally as needed for moderate constipation. 01/04/15  Yes Rogene Houston, MD  dicyclomine (BENTYL) 10 MG capsule Take 1 capsule (10 mg total) by mouth 2 (two) times daily as needed for spasms. 01/04/15  Yes Rogene Houston, MD  DULoxetine (CYMBALTA) 60 MG capsule Take 60 mg by mouth daily.   Yes Historical Provider, MD  esomeprazole (NEXIUM) 20 MG capsule Take 20 mg by mouth 2 (two) times daily before a meal.   Yes Historical Provider, MD  Fluticasone-Salmeterol (ADVAIR) 250-50 MCG/DOSE AEPB Inhale 1 puff into the lungs every 12 (twelve) hours.   Yes Historical Provider, MD  gabapentin (NEURONTIN) 800 MG tablet  10/06/15  Yes Historical Provider, MD  HYDROcodone-acetaminophen (NORCO/VICODIN) 5-325 MG per tablet Take 1 tablet by mouth as needed.  07/23/13  Yes Historical Provider, MD  levothyroxine  (SYNTHROID, LEVOTHROID) 50 MCG tablet Take 50 mcg by mouth daily before breakfast.   Yes Historical Provider, MD  linaclotide (LINZESS) 290 MCG CAPS capsule Take 290 mcg by mouth daily before breakfast.   Yes Historical Provider, MD  lisinopril (PRINIVIL,ZESTRIL) 10 MG tablet Take 10 mg by mouth daily.   Yes Historical Provider, MD  metformin (FORTAMET) 500 MG (OSM) 24 hr tablet Take 500 mg by mouth daily with breakfast.   Yes Historical Provider, MD  ondansetron (ZOFRAN) 4 MG tablet TAKE ONE TABLET BY MOUTH TWICE DAILY AS NEEDED FOR NAUSEA AND VOMITING 08/22/15  Yes Rogene Houston, MD  simvastatin (ZOCOR) 20 MG tablet Take 20 mg by mouth every evening.   Yes Historical Provider, MD  traZODone (DESYREL) 100 MG tablet  09/27/15  Yes Historical Provider, MD  vitamin E 400 UNIT capsule Take 400 Units by mouth daily.   Yes Historical Provider, MD  docusate sodium (COLACE) 100 MG capsule Take 2 capsules (200 mg total) by mouth at bedtime. Patient not taking: Reported on 10/07/2015 01/04/15   Rogene Houston, MD  loperamide (IMODIUM) 2 MG capsule Take 2 mg by mouth 2 (two) times daily as needed. Reported on 10/07/2015    Historical Provider, MD      Allergies  Allergen Reactions  . Flagyl [Metronidazole] Shortness Of Breath and Swelling  . Adhesive [Tape] Other (See Comments)    Takes off patients skin  . Aspirin Other (See Comments)    Stomach bleed  . Lyrica [Pregabalin] Other (See Comments)    Breathing Problems  . Phenergan [Promethazine Hcl] Other (See Comments)    Caused grogginess, altered mental status  . Sulfa Antibiotics Nausea Only  . Tetanus Toxoids Swelling    Arm Area  . Latex Itching and Rash  . Prednisone Anxiety    ROS:  Out of a complete 14 system review of symptoms, the patient complains only of the following symptoms, and all other reviewed systems are negative.  Fatigue Swelling in the legs Ringing in the ears, difficulty swallowing Itching, moles Shortness of  breath, wheezing, snoring Incontinence, diarrhea, constipation Incontinence of the bladder Easy bruising Feeling hot, cold, increased thirst  Joint pain, joint swelling, muscle cramps, aching muscles Allergies, skin sensitivity Memory loss, headache, numbness, weakness, difficulty swallowing Depression, decreased energy, disinterest in activities Sleepiness, snoring, restless legs  Blood pressure 131/69, pulse 73, height 5' 1.5" (1.562 m), weight 142 lb 8 oz (64.638 kg).  Physical Exam  General: The patient is alert and cooperative at the time of the examination.  Eyes: Pupils are equal, round, and reactive to light. Discs are flat bilaterally.  Neck: The neck is  supple, no carotid bruits are noted.  Respiratory: The respiratory examination is clear.  Cardiovascular: The cardiovascular examination reveals a regular rate and rhythm, no obvious murmurs or rubs are noted.  Skin: Extremities are without significant edema.  Neurologic Exam  Mental status: The patient is alert and oriented x 3 at the time of the examination. The patient has apparent normal recent and remote memory, with an apparently normal attention span and concentration ability.  Cranial nerves: Facial symmetry is present. There is good sensation of the face to pinprick and soft touch bilaterally. The strength of the facial muscles and the muscles to head turning and shoulder shrug are normal bilaterally. Speech is well enunciated, no aphasia or dysarthria is noted. Extraocular movements are full. Visual fields are full. The tongue is midline, and the patient has symmetric elevation of the soft palate. No obvious hearing deficits are noted.  Motor: The motor testing reveals 5 over 5 strength of all 4 extremities. Good symmetric motor tone is noted throughout.  Sensory: Sensory testing is intact to pinprick, soft touch, vibration sensation, and position sense on all 4 extremities, with exception that there is a  stocking pattern pinprick sensory deficit in the lower extremities in the distal two thirds of the legs. No evidence of extinction is noted.  Coordination: Cerebellar testing reveals good finger-nose-finger and heel-to-shin bilaterally.  Gait and station: Gait is normal. Tandem gait is normal. Romberg is negative. No drift is seen.  Reflexes: Deep tendon reflexes are symmetric, but are brisk bilaterally in the arms and legs. Toes are downgoing bilaterally.   Assessment/Plan:  1. Fibromyalgia  2. Hyperreflexia  3. Chronic neck, low back pain  The patient be set up for MRI evaluation of the cervical spine given the diffuse hyperreflexia noted on clinical examination. The patient will be evaluated for possible low-grade cervical myelopathy. The patient will be set up for nerve conduction studies on both legs, one arm, EMG on the left leg. The patient will be increased on the Cymbalta dosing taking 60 mg in the morning, 30 mg in the evening. She will follow-up for the EMG evaluation.  Jill Alexanders MD 10/07/2015 3:19 PM  [facility] [address] [place], [place] [address]  Phone [phone] Fax [phone]

## 2015-10-25 ENCOUNTER — Ambulatory Visit (INDEPENDENT_AMBULATORY_CARE_PROVIDER_SITE_OTHER): Payer: Medicare Other | Admitting: Internal Medicine

## 2015-10-25 ENCOUNTER — Encounter (INDEPENDENT_AMBULATORY_CARE_PROVIDER_SITE_OTHER): Payer: Self-pay | Admitting: Internal Medicine

## 2015-10-25 VITALS — BP 110/70 | HR 66 | Temp 97.5°F | Resp 18 | Ht 61.5 in | Wt 143.9 lb

## 2015-10-25 DIAGNOSIS — M25579 Pain in unspecified ankle and joints of unspecified foot: Secondary | ICD-10-CM | POA: Diagnosis not present

## 2015-10-25 DIAGNOSIS — K589 Irritable bowel syndrome without diarrhea: Secondary | ICD-10-CM

## 2015-10-25 DIAGNOSIS — K5900 Constipation, unspecified: Secondary | ICD-10-CM | POA: Diagnosis not present

## 2015-10-25 DIAGNOSIS — M79672 Pain in left foot: Secondary | ICD-10-CM | POA: Diagnosis not present

## 2015-10-25 DIAGNOSIS — R110 Nausea: Secondary | ICD-10-CM | POA: Diagnosis not present

## 2015-10-25 MED ORDER — LINACLOTIDE 290 MCG PO CAPS: 290.0000 ug | ORAL_CAPSULE | Freq: Every day | ORAL | 3 refills | Status: DC

## 2015-10-25 MED ORDER — ONDANSETRON HCL 4 MG PO TABS: 4.0000 mg | ORAL_TABLET | Freq: Two times a day (BID) | ORAL | 2 refills | Status: DC | PRN

## 2015-10-25 NOTE — Progress Notes (Signed)
Presenting complaint;  Follow-up for constipation IBS and GERD.  Subjective:  Patient is 70 year old Caucasian female who is here for scheduled visit. She was last seen in October 2016. She has multiple complaints. He continues to complain of constipation. She passes balls where she every time she has bowel movement. She denies melena or rectal bleeding. She states she was hospitalized at [facility] in April this year for diverticulitis and underwent diagnostic colonoscopy by Dr. Anthony Sar confirming diverticular disease but no other abnormalities were found. She is using linzess daily or every other day. She seemed to get diarrhea with it. As a complains of nausea which is virtually every day and worse after meals. She has not experienced vomiting. She feels heartburn is well controlled with therapy. She has not lost weight. She needs new prescription for ondansetron. She is not taking dicyclomine on daily basis.    Current Medications: Outpatient Encounter Prescriptions as of 10/25/2015  Medication Sig  . acetaminophen (TYLENOL) 500 MG tablet Take 500 mg by mouth every 6 (six) hours as needed for pain.  Marland Kitchen albuterol (PROVENTIL HFA;VENTOLIN HFA) 108 (90 Base) MCG/ACT inhaler Inhale 2 puffs into the lungs every 6 (six) hours as needed for wheezing or shortness of breath.  Marland Kitchen aspirin EC 81 MG tablet Take 81 mg by mouth daily.  . Biotin 5000 MCG CAPS Take by mouth.  . bisacodyl (DULCOLAX) 10 MG suppository Place 1 suppository (10 mg total) rectally as needed for moderate constipation.  . dicyclomine (BENTYL) 10 MG capsule Take 1 capsule (10 mg total) by mouth 2 (two) times daily as needed for spasms.  . DULoxetine (CYMBALTA) 30 MG capsule Take 1 capsule (30 mg total) by mouth daily.  . DULoxetine (CYMBALTA) 60 MG capsule Take 60 mg by mouth daily.  Marland Kitchen esomeprazole (NEXIUM) 20 MG capsule Take 20 mg by mouth 2 (two) times daily before a meal.  . Fluticasone-Salmeterol (ADVAIR) 250-50 MCG/DOSE AEPB Inhale 1  puff into the lungs every 12 (twelve) hours.  . gabapentin (NEURONTIN) 800 MG tablet Take 800 mg by mouth 3 (three) times daily.   Marland Kitchen HYDROcodone-acetaminophen (NORCO/VICODIN) 5-325 MG per tablet Take 1 tablet by mouth as needed.   Marland Kitchen levothyroxine (SYNTHROID, LEVOTHROID) 50 MCG tablet Take 50 mcg by mouth daily before breakfast.  . linaclotide (LINZESS) 290 MCG CAPS capsule Take 290 mcg by mouth daily before breakfast.  . lisinopril (PRINIVIL,ZESTRIL) 10 MG tablet Take 10 mg by mouth daily.  Marland Kitchen loperamide (IMODIUM) 2 MG capsule Take 2 mg by mouth 2 (two) times daily as needed. Reported on 10/07/2015  . metformin (FORTAMET) 500 MG (OSM) 24 hr tablet Take 500 mg by mouth daily with breakfast.  . ondansetron (ZOFRAN) 4 MG tablet TAKE ONE TABLET BY MOUTH TWICE DAILY AS NEEDED FOR NAUSEA AND VOMITING  . simvastatin (ZOCOR) 20 MG tablet Take 20 mg by mouth every evening.  . traZODone (DESYREL) 100 MG tablet Take 100 mg by mouth at bedtime.   . vitamin E 400 UNIT capsule Take 400 Units by mouth daily.  Marland Kitchen docusate sodium (COLACE) 100 MG capsule Take 2 capsules (200 mg total) by mouth at bedtime. (Patient not taking: Reported on 10/07/2015)   No facility-administered encounter medications on file as of 10/25/2015.      Objective: Blood pressure 110/70, pulse 66, temperature 97.5 F (36.4 C), temperature source Oral, resp. rate 18, height 5' 1.5" (1.562 m), weight 143 lb 14.4 oz (65.3 kg). Patient is alert and in no acute distress. Conjunctiva is pink.  Sclera is nonicteric Oropharyngeal mucosa is normal. No neck masses or thyromegaly noted. Cardiac exam with regular rhythm normal S1 and S2. No murmur or gallop noted. Lungs are clear to auscultation. Abdomen is symmetrical. Bowel sounds are normal. On palpation abdomen is soft with mild generalized tenderness without guarding. No organomegaly or masses. No LE edema or clubbing noted.   Assessment:  #1. Chronic constipation. While she has history of  IBS constipation appears to be most likely due to medications. She will benefit from increasing fiber in her diet. She should use Dulcolax suppositories more often so that she would not get impacted. #2. Chronic nausea without vomiting most likely secondary to medications. If nausea gets worse and or she has vomiting will consider diagnostic EGD. #3. Recent bout of diverticulitis. She had colonoscopy by Dr. Anthony Sar in May 2017. #4. Irritable bowel syndrome. She is having intermittent pain and using dicyclomine with relief. #5. Chronic GERD. Symptoms well controlled with therapy.   Plan:  Patient must increase consumption of fiber rich foods. She should eat 1 apple daily which will give her 4 to 6 g of fiber daily. New prescription given for ondansetron. Patient advised to check with PCP if gabapentin dose could be reduced. Next high risk screening colonoscopy in May 2022. Office visit in 6 months.

## 2015-10-25 NOTE — Patient Instructions (Addendum)
Increase intake of fiber rich foods such as eat an apple daily with skin.

## 2015-10-28 ENCOUNTER — Ambulatory Visit
Admission: RE | Admit: 2015-10-28 | Discharge: 2015-10-28 | Disposition: A | Discharge status: Home / Self Care | Payer: Medicare Other | Source: Ambulatory Visit | Attending: Neurology | Admitting: Neurology

## 2015-10-28 DIAGNOSIS — M797 Fibromyalgia: Secondary | ICD-10-CM

## 2015-10-28 DIAGNOSIS — M545 Low back pain: Secondary | ICD-10-CM | POA: Diagnosis not present

## 2015-10-28 DIAGNOSIS — M542 Cervicalgia: Secondary | ICD-10-CM

## 2015-10-28 DIAGNOSIS — G8929 Other chronic pain: Secondary | ICD-10-CM

## 2015-10-30 ENCOUNTER — Telehealth: Payer: Self-pay | Admitting: Neurology

## 2015-10-30 NOTE — Telephone Encounter (Signed)
I called the patient. The MRI of the cervical spine show some possible NR impingement of the left C5 NR and the left C7 NR, no cord compression to explain the increased reflexes of the extremities.   MRI cervical 10/30/15:  IMPRESSION:  This MRI of the cervical spine shows multilevel degenerative changes as detailed above. The most significant findings are: 1.  At C4-C5 there is minimal anterolisthesis of C4 upon C5, and left greater than right degenerative changes causing moderate left foraminal narrowing. There is some encroachment upon the left C5 nerve root though there is no definite nerve root compression. 2.  At C5-C6, there is mild spinal stenosis and right greater than left foraminal narrowing. There does not appear to be nerve root compression. 3.  At C6-C7, there is mild spinal stenosis and a left paramedian disc protrusion. The left foramen is moderately narrowed and there is some encroachment upon the left C7 nerve root but no definite nerve root compression. 4.   There are milder degenerative changes at C2-C3 and C3-C4 that did not lead to any nerve root impingement or spinal stenosis.

## 2015-11-17 DIAGNOSIS — L600 Ingrowing nail: Secondary | ICD-10-CM | POA: Diagnosis not present

## 2015-11-17 DIAGNOSIS — M25579 Pain in unspecified ankle and joints of unspecified foot: Secondary | ICD-10-CM | POA: Diagnosis not present

## 2015-11-17 DIAGNOSIS — E1151 Type 2 diabetes mellitus with diabetic peripheral angiopathy without gangrene: Secondary | ICD-10-CM | POA: Diagnosis not present

## 2015-11-17 DIAGNOSIS — E1140 Type 2 diabetes mellitus with diabetic neuropathy, unspecified: Secondary | ICD-10-CM | POA: Diagnosis not present

## 2015-11-18 DIAGNOSIS — E1142 Type 2 diabetes mellitus with diabetic polyneuropathy: Secondary | ICD-10-CM | POA: Diagnosis not present

## 2015-11-18 DIAGNOSIS — J449 Chronic obstructive pulmonary disease, unspecified: Secondary | ICD-10-CM | POA: Diagnosis not present

## 2015-11-18 DIAGNOSIS — G4733 Obstructive sleep apnea (adult) (pediatric): Secondary | ICD-10-CM | POA: Diagnosis not present

## 2015-11-18 DIAGNOSIS — M797 Fibromyalgia: Secondary | ICD-10-CM | POA: Diagnosis not present

## 2015-11-28 ENCOUNTER — Encounter: Payer: Self-pay | Admitting: Neurology

## 2015-11-28 ENCOUNTER — Ambulatory Visit (INDEPENDENT_AMBULATORY_CARE_PROVIDER_SITE_OTHER): Payer: Medicare Other | Admitting: Neurology

## 2015-11-28 ENCOUNTER — Telehealth: Payer: Self-pay | Admitting: Neurology

## 2015-11-28 ENCOUNTER — Ambulatory Visit (INDEPENDENT_AMBULATORY_CARE_PROVIDER_SITE_OTHER): Payer: Self-pay | Admitting: Neurology

## 2015-11-28 DIAGNOSIS — G8929 Other chronic pain: Secondary | ICD-10-CM | POA: Diagnosis not present

## 2015-11-28 DIAGNOSIS — M545 Low back pain: Secondary | ICD-10-CM

## 2015-11-28 DIAGNOSIS — M797 Fibromyalgia: Secondary | ICD-10-CM

## 2015-11-28 DIAGNOSIS — M549 Dorsalgia, unspecified: Principal | ICD-10-CM

## 2015-11-28 DIAGNOSIS — Z0289 Encounter for other administrative examinations: Secondary | ICD-10-CM

## 2015-11-28 NOTE — Telephone Encounter (Signed)
Daughter Heather Espinoza called 9:49:44 to advise they are running a few minutes behind for 10:00 NCS/EMG appointment this morning, GPS says it will be 10 more minutes.

## 2015-11-28 NOTE — Telephone Encounter (Signed)
I called patient. EMG and nerve conduction study was unremarkable, nothing that would explain her back and leg pain. The patient reports pain that comes on with standing, gets better with sitting or lying down. The patient has not had any recent evaluation of the low back by MRI, if she is amenable to this, I will order MRI of the lumbar spine.

## 2015-11-28 NOTE — Procedures (Signed)
     HISTORY:  Heather Espinoza is a 71 year old patient with a history of fibromyalgia. The patient reports back pain and leg discomfort and weakness with standing. The symptoms get better when she is sitting or lying down. The patient is being evaluated for these issues. The patient has a sensation that the legs are weak.  NERVE CONDUCTION STUDIES:  Nerve conduction studies were performed on the left upper extremity. The distal motor latencies and motor amplitudes for the median and ulnar nerves were within normal limits. The F wave latencies and nerve conduction velocities for these nerves were also normal. The sensory latencies for the median and ulnar nerves were normal.  Nerve conduction studies were performed on both lower extremities. The distal motor latencies and motor amplitudes for the peroneal and posterior tibial nerves were within normal limits. The nerve conduction velocities for these nerves were also normal. The H reflex latencies were normal. The sensory latencies for the peroneal nerves were within normal limits.   EMG STUDIES: EMG study was performed on the left lower extremity:  The tibialis anterior muscle reveals 2 to 4K motor units with full recruitment. No fibrillations or positive waves were seen. The peroneus tertius muscle reveals 2 to 4K motor units with full recruitment. No fibrillations or positive waves were seen. The medial gastrocnemius muscle reveals 1 to 3K motor units with full recruitment. No fibrillations or positive waves were seen. The vastus lateralis muscle reveals 2 to 4K motor units with full recruitment. No fibrillations or positive waves were seen. The iliopsoas muscle reveals 2 to 4K motor units with full recruitment. No fibrillations or positive waves were seen. The biceps femoris muscle (long head) reveals 2 to 4K motor units with full recruitment. No fibrillations or positive waves were seen. The lumbosacral paraspinal muscles were tested at 3  levels, and revealed no abnormalities of insertional activity at all 3 levels tested. There was good relaxation.   IMPRESSION:  Nerve conduction studies done on both lower extremities and on the left upper extremity were within normal limits. There is no evidence of a peripheral neuropathy. EMG evaluation of the left lower extremity is unremarkable without evidence of a myopathic process or evidence of an overlying lumbosacral radiculopathy.  Jill Alexanders MD 11/28/2015 1:40 PM  [facility] [address] [place], [place] [address]  Phone [phone] Fax [phone]

## 2015-11-28 NOTE — Progress Notes (Signed)
Please refer to EMG and NCV procedure note. 

## 2015-11-30 NOTE — Addendum Note (Signed)
Addended by: Margette Fast on: 11/30/2015 04:53 PM   Modules accepted: Orders

## 2015-11-30 NOTE — Telephone Encounter (Signed)
The patient is amenable to the MRI of the low back, I will get this ordered.

## 2015-11-30 NOTE — Telephone Encounter (Signed)
Pt called back stating, "yes" she wants an MRI of her back. May call pt at

## 2015-12-06 ENCOUNTER — Other Ambulatory Visit (HOSPITAL_COMMUNITY): Payer: Self-pay | Admitting: Internal Medicine

## 2015-12-06 DIAGNOSIS — Z1231 Encounter for screening mammogram for malignant neoplasm of breast: Secondary | ICD-10-CM

## 2015-12-09 DIAGNOSIS — Z6828 Body mass index (BMI) 28.0-28.9, adult: Secondary | ICD-10-CM | POA: Diagnosis not present

## 2015-12-09 DIAGNOSIS — Z79899 Other long term (current) drug therapy: Secondary | ICD-10-CM | POA: Diagnosis not present

## 2015-12-09 DIAGNOSIS — E7800 Pure hypercholesterolemia, unspecified: Secondary | ICD-10-CM | POA: Diagnosis not present

## 2015-12-09 DIAGNOSIS — Z0000 Encounter for general adult medical examination without abnormal findings: Secondary | ICD-10-CM | POA: Diagnosis not present

## 2015-12-09 DIAGNOSIS — Z299 Encounter for prophylactic measures, unspecified: Secondary | ICD-10-CM | POA: Diagnosis not present

## 2015-12-09 DIAGNOSIS — E039 Hypothyroidism, unspecified: Secondary | ICD-10-CM | POA: Diagnosis not present

## 2015-12-09 DIAGNOSIS — Z1211 Encounter for screening for malignant neoplasm of colon: Secondary | ICD-10-CM | POA: Diagnosis not present

## 2015-12-09 DIAGNOSIS — E559 Vitamin D deficiency, unspecified: Secondary | ICD-10-CM | POA: Diagnosis not present

## 2015-12-09 DIAGNOSIS — Z7189 Other specified counseling: Secondary | ICD-10-CM | POA: Diagnosis not present

## 2015-12-09 DIAGNOSIS — E1142 Type 2 diabetes mellitus with diabetic polyneuropathy: Secondary | ICD-10-CM | POA: Diagnosis not present

## 2015-12-09 DIAGNOSIS — R5383 Other fatigue: Secondary | ICD-10-CM | POA: Diagnosis not present

## 2015-12-09 DIAGNOSIS — Z1389 Encounter for screening for other disorder: Secondary | ICD-10-CM | POA: Diagnosis not present

## 2015-12-12 DIAGNOSIS — L600 Ingrowing nail: Secondary | ICD-10-CM | POA: Diagnosis not present

## 2015-12-12 DIAGNOSIS — M25579 Pain in unspecified ankle and joints of unspecified foot: Secondary | ICD-10-CM | POA: Diagnosis not present

## 2015-12-12 DIAGNOSIS — E1151 Type 2 diabetes mellitus with diabetic peripheral angiopathy without gangrene: Secondary | ICD-10-CM | POA: Diagnosis not present

## 2015-12-12 DIAGNOSIS — E1140 Type 2 diabetes mellitus with diabetic neuropathy, unspecified: Secondary | ICD-10-CM | POA: Diagnosis not present

## 2015-12-13 ENCOUNTER — Ambulatory Visit
Admission: RE | Admit: 2015-12-13 | Discharge: 2015-12-13 | Disposition: A | Discharge status: Home / Self Care | Payer: Medicare Other | Source: Ambulatory Visit | Attending: Neurology | Admitting: Neurology

## 2015-12-13 ENCOUNTER — Telehealth: Payer: Self-pay | Admitting: Neurology

## 2015-12-13 DIAGNOSIS — M4806 Spinal stenosis, lumbar region: Secondary | ICD-10-CM | POA: Diagnosis not present

## 2015-12-13 DIAGNOSIS — M549 Dorsalgia, unspecified: Secondary | ICD-10-CM | POA: Diagnosis not present

## 2015-12-13 DIAGNOSIS — M79605 Pain in left leg: Secondary | ICD-10-CM

## 2015-12-13 DIAGNOSIS — G8929 Other chronic pain: Secondary | ICD-10-CM

## 2015-12-13 NOTE — Telephone Encounter (Signed)
  I called the patient. The MRI of the low back does not show definite nerve root impingement, there could be some potential involvement of the left L4 nerve root, the patient has severe facet joint arthritis at the L5-S1 level, this could also cause referred pain into the hip and down to the knee level on the left.  I will get the patient set up for an injection in the back to see if this helps.   MRI lumbar 12/13/15:  IMPRESSION:  This MRI of the lumbar spine shows the following: 1.   At L2-L3, there is mild central disc bulging that does not lead to any nerve root impingement. 2.   At L3-L4, there is a small broad disc protrusion that narrows the central canal but not enough to be considered spinal stenosis. There is no nerve root impingement. 3.   At L4-L5, there is a small broad disc protrusion and moderate facet hypertrophy with left ligamentum flavum hypertrophy.  There is moderate left foraminal narrowing. Although there is no definite nerve root compression, there is some encroachment upon the left L4 nerve root. 4.   At L5-S1, there is 2 mm of anterolisthesis due to severe facet hypertrophy. There is no nerve root compression.

## 2015-12-15 ENCOUNTER — Other Ambulatory Visit: Payer: Self-pay | Admitting: Neurology

## 2015-12-15 DIAGNOSIS — M79605 Pain in left leg: Secondary | ICD-10-CM

## 2015-12-19 DIAGNOSIS — J439 Emphysema, unspecified: Secondary | ICD-10-CM | POA: Diagnosis not present

## 2015-12-19 DIAGNOSIS — M1990 Unspecified osteoarthritis, unspecified site: Secondary | ICD-10-CM | POA: Diagnosis not present

## 2015-12-19 DIAGNOSIS — S6991XA Unspecified injury of right wrist, hand and finger(s), initial encounter: Secondary | ICD-10-CM | POA: Diagnosis not present

## 2015-12-19 DIAGNOSIS — M797 Fibromyalgia: Secondary | ICD-10-CM | POA: Diagnosis not present

## 2015-12-19 DIAGNOSIS — Z79899 Other long term (current) drug therapy: Secondary | ICD-10-CM | POA: Diagnosis not present

## 2015-12-19 DIAGNOSIS — Z7982 Long term (current) use of aspirin: Secondary | ICD-10-CM | POA: Diagnosis not present

## 2015-12-19 DIAGNOSIS — M20011 Mallet finger of right finger(s): Secondary | ICD-10-CM | POA: Diagnosis not present

## 2015-12-19 DIAGNOSIS — Z7984 Long term (current) use of oral hypoglycemic drugs: Secondary | ICD-10-CM | POA: Diagnosis not present

## 2015-12-19 DIAGNOSIS — Z7951 Long term (current) use of inhaled steroids: Secondary | ICD-10-CM | POA: Diagnosis not present

## 2015-12-19 DIAGNOSIS — Z8249 Family history of ischemic heart disease and other diseases of the circulatory system: Secondary | ICD-10-CM | POA: Diagnosis not present

## 2015-12-19 DIAGNOSIS — I10 Essential (primary) hypertension: Secondary | ICD-10-CM | POA: Diagnosis not present

## 2015-12-21 DIAGNOSIS — M20011 Mallet finger of right finger(s): Secondary | ICD-10-CM | POA: Diagnosis not present

## 2015-12-22 ENCOUNTER — Ambulatory Visit
Admission: RE | Admit: 2015-12-22 | Discharge: 2015-12-22 | Disposition: A | Discharge status: Home / Self Care | Payer: Medicare Other | Source: Ambulatory Visit | Attending: Neurology | Admitting: Neurology

## 2015-12-22 DIAGNOSIS — M79605 Pain in left leg: Secondary | ICD-10-CM

## 2015-12-22 DIAGNOSIS — M545 Low back pain: Secondary | ICD-10-CM | POA: Diagnosis not present

## 2015-12-22 MED ORDER — IOPAMIDOL (ISOVUE-M 200) INJECTION 41%
1.0000 mL | Freq: Once | INTRAMUSCULAR | Status: AC
  Administered 2015-12-22: 1 mL via EPIDURAL

## 2015-12-22 MED ORDER — METHYLPREDNISOLONE ACETATE 40 MG/ML INJ SUSP (RADIOLOG
120.0000 mg | Freq: Once | INTRAMUSCULAR | Status: AC
  Administered 2015-12-22: 120 mg via EPIDURAL

## 2015-12-22 NOTE — Discharge Instructions (Signed)

## 2016-01-06 ENCOUNTER — Ambulatory Visit (HOSPITAL_COMMUNITY)
Admission: RE | Admit: 2016-01-06 | Discharge: 2016-01-06 | Disposition: A | Discharge status: Home / Self Care | Payer: Medicare Other | Source: Ambulatory Visit | Attending: Internal Medicine | Admitting: Internal Medicine

## 2016-01-06 DIAGNOSIS — Z1231 Encounter for screening mammogram for malignant neoplasm of breast: Secondary | ICD-10-CM | POA: Insufficient documentation

## 2016-01-12 DIAGNOSIS — J069 Acute upper respiratory infection, unspecified: Secondary | ICD-10-CM | POA: Diagnosis not present

## 2016-01-12 DIAGNOSIS — M797 Fibromyalgia: Secondary | ICD-10-CM | POA: Diagnosis not present

## 2016-01-12 DIAGNOSIS — Z299 Encounter for prophylactic measures, unspecified: Secondary | ICD-10-CM | POA: Diagnosis not present

## 2016-01-12 DIAGNOSIS — G4733 Obstructive sleep apnea (adult) (pediatric): Secondary | ICD-10-CM | POA: Diagnosis not present

## 2016-01-12 DIAGNOSIS — Z6828 Body mass index (BMI) 28.0-28.9, adult: Secondary | ICD-10-CM | POA: Diagnosis not present

## 2016-01-23 ENCOUNTER — Other Ambulatory Visit (INDEPENDENT_AMBULATORY_CARE_PROVIDER_SITE_OTHER): Payer: Self-pay | Admitting: Internal Medicine

## 2016-01-31 DIAGNOSIS — E2839 Other primary ovarian failure: Secondary | ICD-10-CM | POA: Diagnosis not present

## 2016-01-31 DIAGNOSIS — M20011 Mallet finger of right finger(s): Secondary | ICD-10-CM | POA: Diagnosis not present

## 2016-02-04 ENCOUNTER — Other Ambulatory Visit: Payer: Self-pay | Admitting: Neurology

## 2016-02-04 DIAGNOSIS — Z23 Encounter for immunization: Secondary | ICD-10-CM | POA: Diagnosis not present

## 2016-02-15 DIAGNOSIS — M20011 Mallet finger of right finger(s): Secondary | ICD-10-CM | POA: Diagnosis not present

## 2016-02-15 DIAGNOSIS — M1811 Unilateral primary osteoarthritis of first carpometacarpal joint, right hand: Secondary | ICD-10-CM | POA: Diagnosis not present

## 2016-02-20 DIAGNOSIS — E1140 Type 2 diabetes mellitus with diabetic neuropathy, unspecified: Secondary | ICD-10-CM | POA: Diagnosis not present

## 2016-02-20 DIAGNOSIS — L600 Ingrowing nail: Secondary | ICD-10-CM | POA: Diagnosis not present

## 2016-02-20 DIAGNOSIS — E1151 Type 2 diabetes mellitus with diabetic peripheral angiopathy without gangrene: Secondary | ICD-10-CM | POA: Diagnosis not present

## 2016-02-20 DIAGNOSIS — M25579 Pain in unspecified ankle and joints of unspecified foot: Secondary | ICD-10-CM | POA: Diagnosis not present

## 2016-03-30 DIAGNOSIS — Z789 Other specified health status: Secondary | ICD-10-CM | POA: Diagnosis not present

## 2016-03-30 DIAGNOSIS — Z6829 Body mass index (BMI) 29.0-29.9, adult: Secondary | ICD-10-CM | POA: Diagnosis not present

## 2016-03-30 DIAGNOSIS — Z713 Dietary counseling and surveillance: Secondary | ICD-10-CM | POA: Diagnosis not present

## 2016-03-30 DIAGNOSIS — R509 Fever, unspecified: Secondary | ICD-10-CM | POA: Diagnosis not present

## 2016-03-30 DIAGNOSIS — Z299 Encounter for prophylactic measures, unspecified: Secondary | ICD-10-CM | POA: Diagnosis not present

## 2016-03-30 DIAGNOSIS — J019 Acute sinusitis, unspecified: Secondary | ICD-10-CM | POA: Diagnosis not present

## 2016-03-30 DIAGNOSIS — J449 Chronic obstructive pulmonary disease, unspecified: Secondary | ICD-10-CM | POA: Diagnosis not present

## 2016-03-30 DIAGNOSIS — E1142 Type 2 diabetes mellitus with diabetic polyneuropathy: Secondary | ICD-10-CM | POA: Diagnosis not present

## 2016-03-30 DIAGNOSIS — I10 Essential (primary) hypertension: Secondary | ICD-10-CM | POA: Diagnosis not present

## 2016-04-03 ENCOUNTER — Telehealth: Payer: Self-pay

## 2016-04-03 ENCOUNTER — Telehealth: Payer: Self-pay | Admitting: Neurology

## 2016-04-03 ENCOUNTER — Ambulatory Visit: Payer: Medicare Other | Admitting: Neurology

## 2016-04-03 NOTE — Telephone Encounter (Signed)
Returned pt call and sooner appt scheduled for 04/05/16.

## 2016-04-03 NOTE — Telephone Encounter (Signed)
Pt no-showed her appt this a.m. 

## 2016-04-03 NOTE — Telephone Encounter (Signed)
Pt called said she's getting over the flu, thought today's appt was tomorrow. She is r/s for 2/26. She is wanting to know if Dr Jannifer Franklin will send order for epidural or will he need to see her 1st.  She is wanting to get in sooner if possible. Pt also said she fell in the dark trying to get into her house before Christmas which has aggravated her back

## 2016-04-05 ENCOUNTER — Ambulatory Visit: Payer: Self-pay | Admitting: Neurology

## 2016-04-05 ENCOUNTER — Other Ambulatory Visit (INDEPENDENT_AMBULATORY_CARE_PROVIDER_SITE_OTHER): Payer: Self-pay | Admitting: Internal Medicine

## 2016-04-10 ENCOUNTER — Encounter: Payer: Self-pay | Admitting: Neurology

## 2016-04-10 DIAGNOSIS — M1811 Unilateral primary osteoarthritis of first carpometacarpal joint, right hand: Secondary | ICD-10-CM | POA: Diagnosis not present

## 2016-04-10 DIAGNOSIS — M20011 Mallet finger of right finger(s): Secondary | ICD-10-CM | POA: Diagnosis not present

## 2016-04-23 DIAGNOSIS — G5790 Unspecified mononeuropathy of unspecified lower limb: Secondary | ICD-10-CM | POA: Diagnosis not present

## 2016-04-23 DIAGNOSIS — M79672 Pain in left foot: Secondary | ICD-10-CM | POA: Diagnosis not present

## 2016-04-23 DIAGNOSIS — G600 Hereditary motor and sensory neuropathy: Secondary | ICD-10-CM | POA: Diagnosis not present

## 2016-04-23 DIAGNOSIS — M79671 Pain in right foot: Secondary | ICD-10-CM | POA: Diagnosis not present

## 2016-04-30 DIAGNOSIS — M25579 Pain in unspecified ankle and joints of unspecified foot: Secondary | ICD-10-CM | POA: Diagnosis not present

## 2016-04-30 DIAGNOSIS — L600 Ingrowing nail: Secondary | ICD-10-CM | POA: Diagnosis not present

## 2016-04-30 DIAGNOSIS — E1140 Type 2 diabetes mellitus with diabetic neuropathy, unspecified: Secondary | ICD-10-CM | POA: Diagnosis not present

## 2016-04-30 DIAGNOSIS — E1151 Type 2 diabetes mellitus with diabetic peripheral angiopathy without gangrene: Secondary | ICD-10-CM | POA: Diagnosis not present

## 2016-05-01 ENCOUNTER — Ambulatory Visit (INDEPENDENT_AMBULATORY_CARE_PROVIDER_SITE_OTHER): Payer: Medicare Other | Admitting: Internal Medicine

## 2016-05-10 ENCOUNTER — Encounter: Payer: Self-pay | Admitting: Neurology

## 2016-05-10 ENCOUNTER — Ambulatory Visit (INDEPENDENT_AMBULATORY_CARE_PROVIDER_SITE_OTHER): Payer: Medicare Other | Admitting: Neurology

## 2016-05-10 VITALS — BP 113/62 | HR 72 | Ht 61.5 in | Wt 149.0 lb

## 2016-05-10 DIAGNOSIS — G8929 Other chronic pain: Secondary | ICD-10-CM

## 2016-05-10 DIAGNOSIS — M5441 Lumbago with sciatica, right side: Secondary | ICD-10-CM | POA: Diagnosis not present

## 2016-05-10 DIAGNOSIS — M5442 Lumbago with sciatica, left side: Secondary | ICD-10-CM

## 2016-05-10 DIAGNOSIS — M797 Fibromyalgia: Secondary | ICD-10-CM

## 2016-05-10 MED ORDER — DULOXETINE HCL 60 MG PO CPEP: 60.0000 mg | ORAL_CAPSULE | Freq: Two times a day (BID) | ORAL | 5 refills | Status: DC

## 2016-05-10 NOTE — Progress Notes (Signed)
Reason for visit: Low back pain  Heather Espinoza is an 71 y.o. female  History of present illness:  Heather Espinoza is a 71 year old right-handed white female with a history of chronic low back pain. The patient has undergone EMG and nerve conduction study that was unremarkable, the patient has pain in the back and pain down both legs to the knees. She does have L4-5 and L5-S1 facet joint arthritis that is the likely source of her discomfort. The patient has undergone an epidural steroid injection in October 2017 that was helpful. The patient is on Cymbalta for fibromyalgia taking 90 mg daily, she is tolerating this well. She is also on gabapentin. She is planning on getting surgery on the right saddle joint of the thumb on 05/24/2016. She returns for an evaluation.  Past Medical History:  Diagnosis Date  . Arthritis   . COPD (chronic obstructive pulmonary disease) ([place])   . Diabetes ([place])    type 2 x 18 months.  . Diverticulitis   . Diverticulosis   . Fibromyalgia   . High cholesterol   . Hypertension    4-5 yrs  . Hypothyroid   . IBS (irritable bowel syndrome)   . Kidney stone    Left Kidney  . Osteopenia   . Plantar fasciitis   . Restless leg   . Sleep apnea   . Tendonitis    Right Wrist  . UTI (lower urinary tract infection)     Past Surgical History:  Procedure Laterality Date  . APPENDECTOMY    . BREAST LUMPECTOMY Right 2001  . CATARACT EXTRACTION Bilateral 2016  . CHOLECYSTECTOMY    . COLONOSCOPY N/A 12/17/2012   Procedure: COLONOSCOPY;  Surgeon: Rogene Houston, MD;  [facility];  Service: Endoscopy;  Laterality: N/A;  215  . complete hysterectomy    . Heel tumor removed    . rt elbow surgery    . TONSILLECTOMY      Family History  Problem Relation Age of Onset  . Uterine cancer Mother   . Parkinson's disease Father   . Stroke Father   . Colon cancer Brother   . Lung cancer Brother   . COPD Sister   . Diabetes Grandchild   . Asthma Son      Social history:  reports that she has never smoked. She has never used smokeless tobacco. She reports that she does not drink alcohol or use drugs.    Allergies  Allergen Reactions  . Flagyl [Metronidazole] Shortness Of Breath and Swelling  . Adhesive [Tape] Other (See Comments)    Takes off patients skin  . Aspirin Other (See Comments)    Stomach bleed  . Lyrica [Pregabalin] Other (See Comments)    Breathing Problems  . Phenergan [Promethazine Hcl] Other (See Comments)    Caused grogginess, altered mental status  . Sulfa Antibiotics Nausea Only  . Tetanus Toxoids Swelling    Arm Area  . Latex Itching and Rash  . Prednisone Anxiety    Medications:  Prior to Admission medications   Medication Sig Start Date End Date Taking? Authorizing Provider  acetaminophen (TYLENOL) 500 MG tablet Take 500 mg by mouth every 6 (six) hours as needed for pain.   Yes Historical Provider, MD  albuterol (PROVENTIL HFA;VENTOLIN HFA) 108 (90 Base) MCG/ACT inhaler Inhale 2 puffs into the lungs every 6 (six) hours as needed for wheezing or shortness of breath.   Yes Historical Provider, MD  aspirin EC 81 MG  tablet Take 81 mg by mouth daily.   Yes Historical Provider, MD  Biotin 5000 MCG CAPS Take by mouth.   Yes Historical Provider, MD  bisacodyl (DULCOLAX) 10 MG suppository Place 1 suppository (10 mg total) rectally as needed for moderate constipation. 01/04/15  Yes Rogene Houston, MD  dicyclomine (BENTYL) 10 MG capsule Take 1 capsule (10 mg total) by mouth 2 (two) times daily as needed for spasms. 01/04/15  Yes Rogene Houston, MD  docusate sodium (COLACE) 100 MG capsule Take 2 capsules (200 mg total) by mouth at bedtime. 01/04/15  Yes Rogene Houston, MD  esomeprazole (NEXIUM) 20 MG capsule Take 20 mg by mouth 2 (two) times daily before a meal.   Yes Historical Provider, MD  Fluticasone-Salmeterol (ADVAIR) 250-50 MCG/DOSE AEPB Inhale 1 puff into the lungs every 12 (twelve) hours.   Yes Historical  Provider, MD  gabapentin (NEURONTIN) 800 MG tablet Take 800 mg by mouth 3 (three) times daily.  10/06/15  Yes Historical Provider, MD  HYDROcodone-acetaminophen (NORCO/VICODIN) 5-325 MG per tablet Take 1 tablet by mouth as needed.  07/23/13  Yes Historical Provider, MD  levothyroxine (SYNTHROID, LEVOTHROID) 50 MCG tablet Take 50 mcg by mouth daily before breakfast.   Yes Historical Provider, MD  linaclotide (LINZESS) 290 MCG CAPS capsule Take 1 capsule (290 mcg total) by mouth daily before breakfast. 10/25/15  Yes Rogene Houston, MD  lisinopril (PRINIVIL,ZESTRIL) 10 MG tablet Take 10 mg by mouth daily.   Yes Historical Provider, MD  metformin (FORTAMET) 500 MG (OSM) 24 hr tablet Take 500 mg by mouth daily with breakfast.   Yes Historical Provider, MD  ondansetron (ZOFRAN) 4 MG tablet TAKE ONE TABLET BY MOUTH TWICE DAILY AS NEEDED FOR NAUSEA OR VOMITING 04/05/16  Yes Rogene Houston, MD  simvastatin (ZOCOR) 20 MG tablet Take 20 mg by mouth every evening.   Yes Historical Provider, MD  traZODone (DESYREL) 100 MG tablet Take 100 mg by mouth at bedtime.  09/27/15  Yes Historical Provider, MD  vitamin E 400 UNIT capsule Take 400 Units by mouth daily.   Yes Historical Provider, MD  DULoxetine (CYMBALTA) 60 MG capsule Take 1 capsule (60 mg total) by mouth 2 (two) times daily. 05/10/16   Kathrynn Ducking, MD    ROS:  Out of a complete 14 system review of symptoms, the patient complains only of the following symptoms, and all other reviewed systems are negative.  Appetite change, fatigue Ringing in the ears, difficulty swallowing Cough, shortness of breath, choking Cold intolerance, heat intolerance, excessive thirst Abdominal pain Environmental allergies Bruising easily  Blood pressure 113/62, pulse 72, height 5' 1.5" (1.562 m), weight 149 lb (67.6 kg).  Physical Exam  General: The patient is alert and cooperative at the time of the examination. The patient is moderately obese.  Neuromuscular: Range  of movement of the low back is full.  Skin: No significant peripheral edema is noted.   Neurologic Exam  Mental status: The patient is alert and oriented x 3 at the time of the examination. The patient has apparent normal recent and remote memory, with an apparently normal attention span and concentration ability.   Cranial nerves: Facial symmetry is present. Speech is normal, no aphasia or dysarthria is noted. Extraocular movements are full. Visual fields are full.  Motor: The patient has good strength in all 4 extremities.  Sensory examination: Soft touch sensation is symmetric on the face, arms, and legs.  Coordination: The patient has good finger-nose-finger  and heel-to-shin bilaterally.  Gait and station: The patient has a normal gait. Tandem gait is unsteady. Romberg is negative. No drift is seen.  Reflexes: Deep tendon reflexes are symmetric.   Assessment/Plan:  1. Chronic low back pain  The patient likely is having pain in the low back related to facet joint arthritis. The patient will be set up for another epidural steroid injection of the low back. The patient will be increased on the Cymbalta taking 60 mg twice daily. She will follow-up in 6 months, sooner if needed. She is to delay the epidural steroid injection until well after the thumb surgery to allow for healing.  Jill Alexanders MD 05/10/2016 12:33 PM  [facility] [address] [place], [place] [address]  Phone [phone] Fax [phone]

## 2016-05-11 ENCOUNTER — Other Ambulatory Visit: Payer: Self-pay | Admitting: Neurology

## 2016-05-11 DIAGNOSIS — M5442 Lumbago with sciatica, left side: Principal | ICD-10-CM

## 2016-05-11 DIAGNOSIS — M5441 Lumbago with sciatica, right side: Principal | ICD-10-CM

## 2016-05-11 DIAGNOSIS — G8929 Other chronic pain: Secondary | ICD-10-CM

## 2016-05-14 ENCOUNTER — Ambulatory Visit: Payer: Medicare Other | Admitting: Neurology

## 2016-05-17 ENCOUNTER — Ambulatory Visit
Admission: RE | Admit: 2016-05-17 | Discharge: 2016-05-17 | Disposition: A | Discharge status: Home / Self Care | Payer: Medicare Other | Source: Ambulatory Visit | Attending: Neurology | Admitting: Neurology

## 2016-05-17 DIAGNOSIS — G8929 Other chronic pain: Secondary | ICD-10-CM

## 2016-05-17 DIAGNOSIS — M5441 Lumbago with sciatica, right side: Principal | ICD-10-CM

## 2016-05-17 DIAGNOSIS — M545 Low back pain: Secondary | ICD-10-CM | POA: Diagnosis not present

## 2016-05-17 DIAGNOSIS — M5442 Lumbago with sciatica, left side: Principal | ICD-10-CM

## 2016-05-17 MED ORDER — IOPAMIDOL (ISOVUE-M 200) INJECTION 41%
1.0000 mL | Freq: Once | INTRAMUSCULAR | Status: AC
  Administered 2016-05-17: 1 mL via EPIDURAL

## 2016-05-17 MED ORDER — METHYLPREDNISOLONE ACETATE 40 MG/ML INJ SUSP (RADIOLOG
120.0000 mg | Freq: Once | INTRAMUSCULAR | Status: AC
  Administered 2016-05-17: 120 mg via EPIDURAL

## 2016-05-17 NOTE — Discharge Instructions (Signed)

## 2016-05-21 DIAGNOSIS — M792 Neuralgia and neuritis, unspecified: Secondary | ICD-10-CM | POA: Diagnosis not present

## 2016-05-21 DIAGNOSIS — M79672 Pain in left foot: Secondary | ICD-10-CM | POA: Diagnosis not present

## 2016-05-21 DIAGNOSIS — G600 Hereditary motor and sensory neuropathy: Secondary | ICD-10-CM | POA: Diagnosis not present

## 2016-05-21 DIAGNOSIS — M79671 Pain in right foot: Secondary | ICD-10-CM | POA: Diagnosis not present

## 2016-05-21 DIAGNOSIS — G5790 Unspecified mononeuropathy of unspecified lower limb: Secondary | ICD-10-CM | POA: Diagnosis not present

## 2016-05-21 DIAGNOSIS — M25579 Pain in unspecified ankle and joints of unspecified foot: Secondary | ICD-10-CM | POA: Diagnosis not present

## 2016-05-22 DIAGNOSIS — Z7982 Long term (current) use of aspirin: Secondary | ICD-10-CM | POA: Diagnosis not present

## 2016-05-22 DIAGNOSIS — E119 Type 2 diabetes mellitus without complications: Secondary | ICD-10-CM | POA: Diagnosis not present

## 2016-05-22 DIAGNOSIS — M797 Fibromyalgia: Secondary | ICD-10-CM | POA: Diagnosis not present

## 2016-05-22 DIAGNOSIS — K589 Irritable bowel syndrome without diarrhea: Secondary | ICD-10-CM | POA: Diagnosis not present

## 2016-05-22 DIAGNOSIS — M79641 Pain in right hand: Secondary | ICD-10-CM | POA: Diagnosis not present

## 2016-05-22 DIAGNOSIS — Z7951 Long term (current) use of inhaled steroids: Secondary | ICD-10-CM | POA: Diagnosis not present

## 2016-05-22 DIAGNOSIS — J449 Chronic obstructive pulmonary disease, unspecified: Secondary | ICD-10-CM | POA: Diagnosis not present

## 2016-05-22 DIAGNOSIS — Z79899 Other long term (current) drug therapy: Secondary | ICD-10-CM | POA: Diagnosis not present

## 2016-05-22 DIAGNOSIS — Z886 Allergy status to analgesic agent status: Secondary | ICD-10-CM | POA: Diagnosis not present

## 2016-05-22 DIAGNOSIS — M1811 Unilateral primary osteoarthritis of first carpometacarpal joint, right hand: Secondary | ICD-10-CM | POA: Diagnosis not present

## 2016-05-22 DIAGNOSIS — Z91048 Other nonmedicinal substance allergy status: Secondary | ICD-10-CM | POA: Diagnosis not present

## 2016-05-22 DIAGNOSIS — Z887 Allergy status to serum and vaccine status: Secondary | ICD-10-CM | POA: Diagnosis not present

## 2016-05-22 DIAGNOSIS — Z86718 Personal history of other venous thrombosis and embolism: Secondary | ICD-10-CM | POA: Diagnosis not present

## 2016-05-22 DIAGNOSIS — Z7984 Long term (current) use of oral hypoglycemic drugs: Secondary | ICD-10-CM | POA: Diagnosis not present

## 2016-05-22 DIAGNOSIS — I10 Essential (primary) hypertension: Secondary | ICD-10-CM | POA: Diagnosis not present

## 2016-05-22 DIAGNOSIS — K219 Gastro-esophageal reflux disease without esophagitis: Secondary | ICD-10-CM | POA: Diagnosis not present

## 2016-05-22 DIAGNOSIS — Z8249 Family history of ischemic heart disease and other diseases of the circulatory system: Secondary | ICD-10-CM | POA: Diagnosis not present

## 2016-05-22 DIAGNOSIS — Z91040 Latex allergy status: Secondary | ICD-10-CM | POA: Diagnosis not present

## 2016-05-22 DIAGNOSIS — G4730 Sleep apnea, unspecified: Secondary | ICD-10-CM | POA: Diagnosis not present

## 2016-05-22 DIAGNOSIS — Z836 Family history of other diseases of the respiratory system: Secondary | ICD-10-CM | POA: Diagnosis not present

## 2016-05-22 DIAGNOSIS — Z823 Family history of stroke: Secondary | ICD-10-CM | POA: Diagnosis not present

## 2016-05-22 DIAGNOSIS — Z888 Allergy status to other drugs, medicaments and biological substances status: Secondary | ICD-10-CM | POA: Diagnosis not present

## 2016-05-25 DIAGNOSIS — M1811 Unilateral primary osteoarthritis of first carpometacarpal joint, right hand: Secondary | ICD-10-CM | POA: Diagnosis not present

## 2016-05-25 DIAGNOSIS — M797 Fibromyalgia: Secondary | ICD-10-CM | POA: Diagnosis not present

## 2016-05-25 DIAGNOSIS — J449 Chronic obstructive pulmonary disease, unspecified: Secondary | ICD-10-CM | POA: Diagnosis not present

## 2016-05-25 DIAGNOSIS — K219 Gastro-esophageal reflux disease without esophagitis: Secondary | ICD-10-CM | POA: Diagnosis not present

## 2016-05-25 DIAGNOSIS — Z79899 Other long term (current) drug therapy: Secondary | ICD-10-CM | POA: Diagnosis not present

## 2016-05-25 DIAGNOSIS — Z7982 Long term (current) use of aspirin: Secondary | ICD-10-CM | POA: Diagnosis not present

## 2016-05-25 DIAGNOSIS — M06841 Other specified rheumatoid arthritis, right hand: Secondary | ICD-10-CM | POA: Diagnosis not present

## 2016-05-25 DIAGNOSIS — K589 Irritable bowel syndrome without diarrhea: Secondary | ICD-10-CM | POA: Diagnosis not present

## 2016-05-25 DIAGNOSIS — Z96691 Finger-joint replacement of right hand: Secondary | ICD-10-CM | POA: Diagnosis not present

## 2016-05-25 DIAGNOSIS — Z7951 Long term (current) use of inhaled steroids: Secondary | ICD-10-CM | POA: Diagnosis not present

## 2016-05-25 DIAGNOSIS — G4730 Sleep apnea, unspecified: Secondary | ICD-10-CM | POA: Diagnosis not present

## 2016-05-25 DIAGNOSIS — Z7984 Long term (current) use of oral hypoglycemic drugs: Secondary | ICD-10-CM | POA: Diagnosis not present

## 2016-05-25 DIAGNOSIS — E119 Type 2 diabetes mellitus without complications: Secondary | ICD-10-CM | POA: Diagnosis not present

## 2016-05-25 DIAGNOSIS — I10 Essential (primary) hypertension: Secondary | ICD-10-CM | POA: Diagnosis not present

## 2016-05-25 DIAGNOSIS — Z471 Aftercare following joint replacement surgery: Secondary | ICD-10-CM | POA: Diagnosis not present

## 2016-06-19 DIAGNOSIS — M25579 Pain in unspecified ankle and joints of unspecified foot: Secondary | ICD-10-CM | POA: Diagnosis not present

## 2016-06-19 DIAGNOSIS — M79671 Pain in right foot: Secondary | ICD-10-CM | POA: Diagnosis not present

## 2016-06-19 DIAGNOSIS — M79672 Pain in left foot: Secondary | ICD-10-CM | POA: Diagnosis not present

## 2016-06-21 ENCOUNTER — Other Ambulatory Visit (INDEPENDENT_AMBULATORY_CARE_PROVIDER_SITE_OTHER): Payer: Self-pay | Admitting: Internal Medicine

## 2016-07-03 ENCOUNTER — Ambulatory Visit (INDEPENDENT_AMBULATORY_CARE_PROVIDER_SITE_OTHER): Payer: Medicare Other | Admitting: Internal Medicine

## 2016-07-03 DIAGNOSIS — M1811 Unilateral primary osteoarthritis of first carpometacarpal joint, right hand: Secondary | ICD-10-CM | POA: Diagnosis not present

## 2016-07-10 DIAGNOSIS — E119 Type 2 diabetes mellitus without complications: Secondary | ICD-10-CM | POA: Diagnosis not present

## 2016-07-10 DIAGNOSIS — I10 Essential (primary) hypertension: Secondary | ICD-10-CM | POA: Diagnosis not present

## 2016-07-11 DIAGNOSIS — G4733 Obstructive sleep apnea (adult) (pediatric): Secondary | ICD-10-CM | POA: Diagnosis not present

## 2016-07-11 DIAGNOSIS — E039 Hypothyroidism, unspecified: Secondary | ICD-10-CM | POA: Diagnosis not present

## 2016-07-11 DIAGNOSIS — Z6829 Body mass index (BMI) 29.0-29.9, adult: Secondary | ICD-10-CM | POA: Diagnosis not present

## 2016-07-11 DIAGNOSIS — E1142 Type 2 diabetes mellitus with diabetic polyneuropathy: Secondary | ICD-10-CM | POA: Diagnosis not present

## 2016-07-11 DIAGNOSIS — G5601 Carpal tunnel syndrome, right upper limb: Secondary | ICD-10-CM | POA: Diagnosis not present

## 2016-07-11 DIAGNOSIS — E1165 Type 2 diabetes mellitus with hyperglycemia: Secondary | ICD-10-CM | POA: Diagnosis not present

## 2016-07-11 DIAGNOSIS — J019 Acute sinusitis, unspecified: Secondary | ICD-10-CM | POA: Diagnosis not present

## 2016-07-11 DIAGNOSIS — I10 Essential (primary) hypertension: Secondary | ICD-10-CM | POA: Diagnosis not present

## 2016-07-11 DIAGNOSIS — J449 Chronic obstructive pulmonary disease, unspecified: Secondary | ICD-10-CM | POA: Diagnosis not present

## 2016-07-11 DIAGNOSIS — Z299 Encounter for prophylactic measures, unspecified: Secondary | ICD-10-CM | POA: Diagnosis not present

## 2016-07-11 DIAGNOSIS — K589 Irritable bowel syndrome without diarrhea: Secondary | ICD-10-CM | POA: Diagnosis not present

## 2016-07-11 DIAGNOSIS — Z789 Other specified health status: Secondary | ICD-10-CM | POA: Diagnosis not present

## 2016-07-12 DIAGNOSIS — M6281 Muscle weakness (generalized): Secondary | ICD-10-CM | POA: Diagnosis not present

## 2016-07-12 DIAGNOSIS — M25641 Stiffness of right hand, not elsewhere classified: Secondary | ICD-10-CM | POA: Diagnosis not present

## 2016-07-12 DIAGNOSIS — M25441 Effusion, right hand: Secondary | ICD-10-CM | POA: Diagnosis not present

## 2016-07-12 DIAGNOSIS — M79641 Pain in right hand: Secondary | ICD-10-CM | POA: Diagnosis not present

## 2016-07-13 DIAGNOSIS — M6281 Muscle weakness (generalized): Secondary | ICD-10-CM | POA: Diagnosis not present

## 2016-07-13 DIAGNOSIS — M79641 Pain in right hand: Secondary | ICD-10-CM | POA: Diagnosis not present

## 2016-07-13 DIAGNOSIS — M25441 Effusion, right hand: Secondary | ICD-10-CM | POA: Diagnosis not present

## 2016-07-13 DIAGNOSIS — M25641 Stiffness of right hand, not elsewhere classified: Secondary | ICD-10-CM | POA: Diagnosis not present

## 2016-07-17 DIAGNOSIS — M25579 Pain in unspecified ankle and joints of unspecified foot: Secondary | ICD-10-CM | POA: Diagnosis not present

## 2016-07-17 DIAGNOSIS — M79672 Pain in left foot: Secondary | ICD-10-CM | POA: Diagnosis not present

## 2016-07-17 DIAGNOSIS — M79671 Pain in right foot: Secondary | ICD-10-CM | POA: Diagnosis not present

## 2016-07-17 DIAGNOSIS — M25441 Effusion, right hand: Secondary | ICD-10-CM | POA: Diagnosis not present

## 2016-07-17 DIAGNOSIS — M25641 Stiffness of right hand, not elsewhere classified: Secondary | ICD-10-CM | POA: Diagnosis not present

## 2016-07-17 DIAGNOSIS — M6281 Muscle weakness (generalized): Secondary | ICD-10-CM | POA: Diagnosis not present

## 2016-07-17 DIAGNOSIS — M79641 Pain in right hand: Secondary | ICD-10-CM | POA: Diagnosis not present

## 2016-07-19 DIAGNOSIS — M25441 Effusion, right hand: Secondary | ICD-10-CM | POA: Diagnosis not present

## 2016-07-19 DIAGNOSIS — M79641 Pain in right hand: Secondary | ICD-10-CM | POA: Diagnosis not present

## 2016-07-19 DIAGNOSIS — M25641 Stiffness of right hand, not elsewhere classified: Secondary | ICD-10-CM | POA: Diagnosis not present

## 2016-07-19 DIAGNOSIS — M6281 Muscle weakness (generalized): Secondary | ICD-10-CM | POA: Diagnosis not present

## 2016-07-20 DIAGNOSIS — M6281 Muscle weakness (generalized): Secondary | ICD-10-CM | POA: Diagnosis not present

## 2016-07-20 DIAGNOSIS — M25641 Stiffness of right hand, not elsewhere classified: Secondary | ICD-10-CM | POA: Diagnosis not present

## 2016-07-20 DIAGNOSIS — M25441 Effusion, right hand: Secondary | ICD-10-CM | POA: Diagnosis not present

## 2016-07-20 DIAGNOSIS — M79641 Pain in right hand: Secondary | ICD-10-CM | POA: Diagnosis not present

## 2016-07-23 DIAGNOSIS — I10 Essential (primary) hypertension: Secondary | ICD-10-CM | POA: Diagnosis not present

## 2016-07-23 DIAGNOSIS — E119 Type 2 diabetes mellitus without complications: Secondary | ICD-10-CM | POA: Diagnosis not present

## 2016-07-24 ENCOUNTER — Other Ambulatory Visit (INDEPENDENT_AMBULATORY_CARE_PROVIDER_SITE_OTHER): Payer: Self-pay | Admitting: Internal Medicine

## 2016-07-24 DIAGNOSIS — M79641 Pain in right hand: Secondary | ICD-10-CM | POA: Diagnosis not present

## 2016-07-24 DIAGNOSIS — M25641 Stiffness of right hand, not elsewhere classified: Secondary | ICD-10-CM | POA: Diagnosis not present

## 2016-07-24 DIAGNOSIS — M25441 Effusion, right hand: Secondary | ICD-10-CM | POA: Diagnosis not present

## 2016-07-24 DIAGNOSIS — M6281 Muscle weakness (generalized): Secondary | ICD-10-CM | POA: Diagnosis not present

## 2016-07-24 NOTE — Telephone Encounter (Signed)
Per Dr.Rehman  The patient will need to have a OV prior to further refills on this medication. Or the patient may get it from her PCP.

## 2016-07-25 DIAGNOSIS — M6281 Muscle weakness (generalized): Secondary | ICD-10-CM | POA: Diagnosis not present

## 2016-07-25 DIAGNOSIS — M25641 Stiffness of right hand, not elsewhere classified: Secondary | ICD-10-CM | POA: Diagnosis not present

## 2016-07-25 DIAGNOSIS — M79641 Pain in right hand: Secondary | ICD-10-CM | POA: Diagnosis not present

## 2016-07-25 DIAGNOSIS — M25441 Effusion, right hand: Secondary | ICD-10-CM | POA: Diagnosis not present

## 2016-07-30 DIAGNOSIS — M6281 Muscle weakness (generalized): Secondary | ICD-10-CM | POA: Diagnosis not present

## 2016-07-30 DIAGNOSIS — M79641 Pain in right hand: Secondary | ICD-10-CM | POA: Diagnosis not present

## 2016-07-30 DIAGNOSIS — M25641 Stiffness of right hand, not elsewhere classified: Secondary | ICD-10-CM | POA: Diagnosis not present

## 2016-07-30 DIAGNOSIS — M25441 Effusion, right hand: Secondary | ICD-10-CM | POA: Diagnosis not present

## 2016-07-31 DIAGNOSIS — E1140 Type 2 diabetes mellitus with diabetic neuropathy, unspecified: Secondary | ICD-10-CM | POA: Diagnosis not present

## 2016-07-31 DIAGNOSIS — M6281 Muscle weakness (generalized): Secondary | ICD-10-CM | POA: Diagnosis not present

## 2016-07-31 DIAGNOSIS — E1151 Type 2 diabetes mellitus with diabetic peripheral angiopathy without gangrene: Secondary | ICD-10-CM | POA: Diagnosis not present

## 2016-07-31 DIAGNOSIS — L600 Ingrowing nail: Secondary | ICD-10-CM | POA: Diagnosis not present

## 2016-07-31 DIAGNOSIS — M25579 Pain in unspecified ankle and joints of unspecified foot: Secondary | ICD-10-CM | POA: Diagnosis not present

## 2016-07-31 DIAGNOSIS — M25641 Stiffness of right hand, not elsewhere classified: Secondary | ICD-10-CM | POA: Diagnosis not present

## 2016-07-31 DIAGNOSIS — M25441 Effusion, right hand: Secondary | ICD-10-CM | POA: Diagnosis not present

## 2016-07-31 DIAGNOSIS — M79641 Pain in right hand: Secondary | ICD-10-CM | POA: Diagnosis not present

## 2016-08-14 DIAGNOSIS — M6281 Muscle weakness (generalized): Secondary | ICD-10-CM | POA: Diagnosis not present

## 2016-08-14 DIAGNOSIS — M79641 Pain in right hand: Secondary | ICD-10-CM | POA: Diagnosis not present

## 2016-08-14 DIAGNOSIS — M79671 Pain in right foot: Secondary | ICD-10-CM | POA: Diagnosis not present

## 2016-08-14 DIAGNOSIS — M25641 Stiffness of right hand, not elsewhere classified: Secondary | ICD-10-CM | POA: Diagnosis not present

## 2016-08-14 DIAGNOSIS — M25441 Effusion, right hand: Secondary | ICD-10-CM | POA: Diagnosis not present

## 2016-08-14 DIAGNOSIS — M25579 Pain in unspecified ankle and joints of unspecified foot: Secondary | ICD-10-CM | POA: Diagnosis not present

## 2016-08-14 DIAGNOSIS — M79672 Pain in left foot: Secondary | ICD-10-CM | POA: Diagnosis not present

## 2016-08-21 DIAGNOSIS — M79641 Pain in right hand: Secondary | ICD-10-CM | POA: Diagnosis not present

## 2016-08-21 DIAGNOSIS — M6281 Muscle weakness (generalized): Secondary | ICD-10-CM | POA: Diagnosis not present

## 2016-08-21 DIAGNOSIS — M25441 Effusion, right hand: Secondary | ICD-10-CM | POA: Diagnosis not present

## 2016-08-21 DIAGNOSIS — M25641 Stiffness of right hand, not elsewhere classified: Secondary | ICD-10-CM | POA: Diagnosis not present

## 2016-08-22 ENCOUNTER — Other Ambulatory Visit (INDEPENDENT_AMBULATORY_CARE_PROVIDER_SITE_OTHER): Payer: Self-pay | Admitting: Internal Medicine

## 2016-08-23 ENCOUNTER — Telehealth (INDEPENDENT_AMBULATORY_CARE_PROVIDER_SITE_OTHER): Payer: Self-pay | Admitting: *Deleted

## 2016-08-23 DIAGNOSIS — M25641 Stiffness of right hand, not elsewhere classified: Secondary | ICD-10-CM | POA: Diagnosis not present

## 2016-08-23 DIAGNOSIS — M85841 Other specified disorders of bone density and structure, right hand: Secondary | ICD-10-CM | POA: Diagnosis not present

## 2016-08-23 DIAGNOSIS — E1140 Type 2 diabetes mellitus with diabetic neuropathy, unspecified: Secondary | ICD-10-CM | POA: Diagnosis not present

## 2016-08-23 DIAGNOSIS — M6281 Muscle weakness (generalized): Secondary | ICD-10-CM | POA: Diagnosis not present

## 2016-08-23 DIAGNOSIS — M12841 Other specific arthropathies, not elsewhere classified, right hand: Secondary | ICD-10-CM | POA: Diagnosis not present

## 2016-08-23 DIAGNOSIS — L600 Ingrowing nail: Secondary | ICD-10-CM | POA: Diagnosis not present

## 2016-08-23 DIAGNOSIS — E1151 Type 2 diabetes mellitus with diabetic peripheral angiopathy without gangrene: Secondary | ICD-10-CM | POA: Diagnosis not present

## 2016-08-23 DIAGNOSIS — M79641 Pain in right hand: Secondary | ICD-10-CM | POA: Diagnosis not present

## 2016-08-23 DIAGNOSIS — M1811 Unilateral primary osteoarthritis of first carpometacarpal joint, right hand: Secondary | ICD-10-CM | POA: Diagnosis not present

## 2016-08-23 DIAGNOSIS — M25579 Pain in unspecified ankle and joints of unspecified foot: Secondary | ICD-10-CM | POA: Diagnosis not present

## 2016-08-23 DIAGNOSIS — M25441 Effusion, right hand: Secondary | ICD-10-CM | POA: Diagnosis not present

## 2016-08-23 NOTE — Telephone Encounter (Signed)
Patient was called in her cell number. No answer and voicemail full. Also called the home number and the call was transferred over to the cell number.  Patient was called to follow up on her  Ondansetron. Want to see how many she is taking a day and talk about her nausea.

## 2016-08-30 DIAGNOSIS — M6281 Muscle weakness (generalized): Secondary | ICD-10-CM | POA: Diagnosis not present

## 2016-08-30 DIAGNOSIS — M79641 Pain in right hand: Secondary | ICD-10-CM | POA: Diagnosis not present

## 2016-08-30 DIAGNOSIS — M25441 Effusion, right hand: Secondary | ICD-10-CM | POA: Diagnosis not present

## 2016-08-30 DIAGNOSIS — M25641 Stiffness of right hand, not elsewhere classified: Secondary | ICD-10-CM | POA: Diagnosis not present

## 2016-09-04 NOTE — Telephone Encounter (Signed)
Patient has an appointment scheduled for 12/04/16 at 2:30pm with Dr. Laural Golden.

## 2016-09-12 ENCOUNTER — Telehealth: Payer: Self-pay | Admitting: Neurology

## 2016-09-12 DIAGNOSIS — M79641 Pain in right hand: Secondary | ICD-10-CM | POA: Diagnosis not present

## 2016-09-12 DIAGNOSIS — M6281 Muscle weakness (generalized): Secondary | ICD-10-CM | POA: Diagnosis not present

## 2016-09-12 DIAGNOSIS — M25641 Stiffness of right hand, not elsewhere classified: Secondary | ICD-10-CM | POA: Diagnosis not present

## 2016-09-12 DIAGNOSIS — M180 Bilateral primary osteoarthritis of first carpometacarpal joints: Secondary | ICD-10-CM | POA: Diagnosis not present

## 2016-09-12 DIAGNOSIS — G8929 Other chronic pain: Secondary | ICD-10-CM

## 2016-09-12 DIAGNOSIS — M5441 Lumbago with sciatica, right side: Principal | ICD-10-CM

## 2016-09-12 DIAGNOSIS — M5442 Lumbago with sciatica, left side: Principal | ICD-10-CM

## 2016-09-12 NOTE — Addendum Note (Signed)
Addended by: Kathrynn Ducking on: 09/12/2016 05:00 PM   Modules accepted: Orders

## 2016-09-12 NOTE — Telephone Encounter (Signed)
I called the patient. The patient had an epidural steroid injection last in the beginning of March, it lasted about 3 months. The radiologist suggested a facet joint injection at L4-5 level secondary to significant facet joint arthritis. We will try a facet joint injection at this time.

## 2016-09-12 NOTE — Telephone Encounter (Signed)
Patient is calling stating for the last week she has had lots of problems with pain in her back. She also wants to know if any recommendations were sent to our office from [place] since her last iinjection there.

## 2016-09-14 ENCOUNTER — Other Ambulatory Visit: Payer: Self-pay | Admitting: Neurology

## 2016-09-14 DIAGNOSIS — G8929 Other chronic pain: Secondary | ICD-10-CM

## 2016-09-14 DIAGNOSIS — M5442 Lumbago with sciatica, left side: Principal | ICD-10-CM

## 2016-09-14 DIAGNOSIS — M25641 Stiffness of right hand, not elsewhere classified: Secondary | ICD-10-CM | POA: Diagnosis not present

## 2016-09-14 DIAGNOSIS — M79641 Pain in right hand: Secondary | ICD-10-CM | POA: Diagnosis not present

## 2016-09-14 DIAGNOSIS — M5441 Lumbago with sciatica, right side: Principal | ICD-10-CM

## 2016-09-14 DIAGNOSIS — M180 Bilateral primary osteoarthritis of first carpometacarpal joints: Secondary | ICD-10-CM | POA: Diagnosis not present

## 2016-09-14 DIAGNOSIS — M6281 Muscle weakness (generalized): Secondary | ICD-10-CM | POA: Diagnosis not present

## 2016-09-17 DIAGNOSIS — M6281 Muscle weakness (generalized): Secondary | ICD-10-CM | POA: Diagnosis not present

## 2016-09-17 DIAGNOSIS — M180 Bilateral primary osteoarthritis of first carpometacarpal joints: Secondary | ICD-10-CM | POA: Diagnosis not present

## 2016-09-17 DIAGNOSIS — M79641 Pain in right hand: Secondary | ICD-10-CM | POA: Diagnosis not present

## 2016-09-17 DIAGNOSIS — M25641 Stiffness of right hand, not elsewhere classified: Secondary | ICD-10-CM | POA: Diagnosis not present

## 2016-09-21 ENCOUNTER — Ambulatory Visit
Admission: RE | Admit: 2016-09-21 | Discharge: 2016-09-21 | Disposition: A | Discharge status: Home / Self Care | Payer: Medicare Other | Source: Ambulatory Visit | Attending: Neurology | Admitting: Neurology

## 2016-09-21 DIAGNOSIS — M5442 Lumbago with sciatica, left side: Principal | ICD-10-CM

## 2016-09-21 DIAGNOSIS — M180 Bilateral primary osteoarthritis of first carpometacarpal joints: Secondary | ICD-10-CM | POA: Diagnosis not present

## 2016-09-21 DIAGNOSIS — M25641 Stiffness of right hand, not elsewhere classified: Secondary | ICD-10-CM | POA: Diagnosis not present

## 2016-09-21 DIAGNOSIS — G8929 Other chronic pain: Secondary | ICD-10-CM

## 2016-09-21 DIAGNOSIS — M6281 Muscle weakness (generalized): Secondary | ICD-10-CM | POA: Diagnosis not present

## 2016-09-21 DIAGNOSIS — M545 Low back pain: Secondary | ICD-10-CM | POA: Diagnosis not present

## 2016-09-21 DIAGNOSIS — M5441 Lumbago with sciatica, right side: Principal | ICD-10-CM

## 2016-09-21 DIAGNOSIS — M79641 Pain in right hand: Secondary | ICD-10-CM | POA: Diagnosis not present

## 2016-09-21 MED ORDER — METHYLPREDNISOLONE ACETATE 40 MG/ML INJ SUSP (RADIOLOG
120.0000 mg | Freq: Once | INTRAMUSCULAR | Status: AC
  Administered 2016-09-21: 120 mg via EPIDURAL

## 2016-09-21 MED ORDER — IOPAMIDOL (ISOVUE-M 200) INJECTION 41%
1.0000 mL | Freq: Once | INTRAMUSCULAR | Status: AC
  Administered 2016-09-21: 1 mL via EPIDURAL

## 2016-09-21 NOTE — Discharge Instructions (Signed)

## 2016-09-25 DIAGNOSIS — M25579 Pain in unspecified ankle and joints of unspecified foot: Secondary | ICD-10-CM | POA: Diagnosis not present

## 2016-09-25 DIAGNOSIS — M79671 Pain in right foot: Secondary | ICD-10-CM | POA: Diagnosis not present

## 2016-09-25 DIAGNOSIS — M79672 Pain in left foot: Secondary | ICD-10-CM | POA: Diagnosis not present

## 2016-10-01 DIAGNOSIS — M25641 Stiffness of right hand, not elsewhere classified: Secondary | ICD-10-CM | POA: Diagnosis not present

## 2016-10-01 DIAGNOSIS — M6281 Muscle weakness (generalized): Secondary | ICD-10-CM | POA: Diagnosis not present

## 2016-10-01 DIAGNOSIS — M79641 Pain in right hand: Secondary | ICD-10-CM | POA: Diagnosis not present

## 2016-10-01 DIAGNOSIS — M180 Bilateral primary osteoarthritis of first carpometacarpal joints: Secondary | ICD-10-CM | POA: Diagnosis not present

## 2016-10-17 DIAGNOSIS — M6281 Muscle weakness (generalized): Secondary | ICD-10-CM | POA: Diagnosis not present

## 2016-10-17 DIAGNOSIS — M1990 Unspecified osteoarthritis, unspecified site: Secondary | ICD-10-CM | POA: Diagnosis not present

## 2016-10-17 DIAGNOSIS — M25641 Stiffness of right hand, not elsewhere classified: Secondary | ICD-10-CM | POA: Diagnosis not present

## 2016-10-17 DIAGNOSIS — E1142 Type 2 diabetes mellitus with diabetic polyneuropathy: Secondary | ICD-10-CM | POA: Diagnosis not present

## 2016-10-17 DIAGNOSIS — I10 Essential (primary) hypertension: Secondary | ICD-10-CM | POA: Diagnosis not present

## 2016-10-17 DIAGNOSIS — E1165 Type 2 diabetes mellitus with hyperglycemia: Secondary | ICD-10-CM | POA: Diagnosis not present

## 2016-10-17 DIAGNOSIS — Z6828 Body mass index (BMI) 28.0-28.9, adult: Secondary | ICD-10-CM | POA: Diagnosis not present

## 2016-10-17 DIAGNOSIS — G4733 Obstructive sleep apnea (adult) (pediatric): Secondary | ICD-10-CM | POA: Diagnosis not present

## 2016-10-17 DIAGNOSIS — M180 Bilateral primary osteoarthritis of first carpometacarpal joints: Secondary | ICD-10-CM | POA: Diagnosis not present

## 2016-10-17 DIAGNOSIS — M79641 Pain in right hand: Secondary | ICD-10-CM | POA: Diagnosis not present

## 2016-10-17 DIAGNOSIS — Z299 Encounter for prophylactic measures, unspecified: Secondary | ICD-10-CM | POA: Diagnosis not present

## 2016-10-17 DIAGNOSIS — E039 Hypothyroidism, unspecified: Secondary | ICD-10-CM | POA: Diagnosis not present

## 2016-10-17 DIAGNOSIS — B354 Tinea corporis: Secondary | ICD-10-CM | POA: Diagnosis not present

## 2016-10-17 DIAGNOSIS — Z90710 Acquired absence of both cervix and uterus: Secondary | ICD-10-CM | POA: Diagnosis not present

## 2016-10-17 DIAGNOSIS — E7800 Pure hypercholesterolemia, unspecified: Secondary | ICD-10-CM | POA: Diagnosis not present

## 2016-10-17 DIAGNOSIS — J449 Chronic obstructive pulmonary disease, unspecified: Secondary | ICD-10-CM | POA: Diagnosis not present

## 2016-10-18 DIAGNOSIS — E119 Type 2 diabetes mellitus without complications: Secondary | ICD-10-CM | POA: Diagnosis not present

## 2016-10-18 DIAGNOSIS — I10 Essential (primary) hypertension: Secondary | ICD-10-CM | POA: Diagnosis not present

## 2016-10-21 ENCOUNTER — Other Ambulatory Visit (INDEPENDENT_AMBULATORY_CARE_PROVIDER_SITE_OTHER): Payer: Self-pay | Admitting: Internal Medicine

## 2016-10-30 DIAGNOSIS — M79672 Pain in left foot: Secondary | ICD-10-CM | POA: Diagnosis not present

## 2016-10-30 DIAGNOSIS — M25579 Pain in unspecified ankle and joints of unspecified foot: Secondary | ICD-10-CM | POA: Diagnosis not present

## 2016-10-30 DIAGNOSIS — M79671 Pain in right foot: Secondary | ICD-10-CM | POA: Diagnosis not present

## 2016-10-31 DIAGNOSIS — M8580 Other specified disorders of bone density and structure, unspecified site: Secondary | ICD-10-CM | POA: Diagnosis not present

## 2016-10-31 DIAGNOSIS — Z299 Encounter for prophylactic measures, unspecified: Secondary | ICD-10-CM | POA: Diagnosis not present

## 2016-10-31 DIAGNOSIS — B354 Tinea corporis: Secondary | ICD-10-CM | POA: Diagnosis not present

## 2016-10-31 DIAGNOSIS — E1165 Type 2 diabetes mellitus with hyperglycemia: Secondary | ICD-10-CM | POA: Diagnosis not present

## 2016-10-31 DIAGNOSIS — E039 Hypothyroidism, unspecified: Secondary | ICD-10-CM | POA: Diagnosis not present

## 2016-10-31 DIAGNOSIS — E7800 Pure hypercholesterolemia, unspecified: Secondary | ICD-10-CM | POA: Diagnosis not present

## 2016-10-31 DIAGNOSIS — G4733 Obstructive sleep apnea (adult) (pediatric): Secondary | ICD-10-CM | POA: Diagnosis not present

## 2016-10-31 DIAGNOSIS — E1142 Type 2 diabetes mellitus with diabetic polyneuropathy: Secondary | ICD-10-CM | POA: Diagnosis not present

## 2016-10-31 DIAGNOSIS — Z6828 Body mass index (BMI) 28.0-28.9, adult: Secondary | ICD-10-CM | POA: Diagnosis not present

## 2016-10-31 DIAGNOSIS — I10 Essential (primary) hypertension: Secondary | ICD-10-CM | POA: Diagnosis not present

## 2016-10-31 DIAGNOSIS — J449 Chronic obstructive pulmonary disease, unspecified: Secondary | ICD-10-CM | POA: Diagnosis not present

## 2016-11-01 DIAGNOSIS — M25641 Stiffness of right hand, not elsewhere classified: Secondary | ICD-10-CM | POA: Diagnosis not present

## 2016-11-01 DIAGNOSIS — M6281 Muscle weakness (generalized): Secondary | ICD-10-CM | POA: Diagnosis not present

## 2016-11-01 DIAGNOSIS — M1812 Unilateral primary osteoarthritis of first carpometacarpal joint, left hand: Secondary | ICD-10-CM | POA: Diagnosis not present

## 2016-11-01 DIAGNOSIS — M180 Bilateral primary osteoarthritis of first carpometacarpal joints: Secondary | ICD-10-CM | POA: Diagnosis not present

## 2016-11-01 DIAGNOSIS — M79641 Pain in right hand: Secondary | ICD-10-CM | POA: Diagnosis not present

## 2016-11-03 DIAGNOSIS — R21 Rash and other nonspecific skin eruption: Secondary | ICD-10-CM | POA: Diagnosis not present

## 2016-11-03 DIAGNOSIS — Z8249 Family history of ischemic heart disease and other diseases of the circulatory system: Secondary | ICD-10-CM | POA: Diagnosis not present

## 2016-11-03 DIAGNOSIS — Z7984 Long term (current) use of oral hypoglycemic drugs: Secondary | ICD-10-CM | POA: Diagnosis not present

## 2016-11-03 DIAGNOSIS — Z7982 Long term (current) use of aspirin: Secondary | ICD-10-CM | POA: Diagnosis not present

## 2016-11-03 DIAGNOSIS — I10 Essential (primary) hypertension: Secondary | ICD-10-CM | POA: Diagnosis not present

## 2016-11-03 DIAGNOSIS — M797 Fibromyalgia: Secondary | ICD-10-CM | POA: Diagnosis not present

## 2016-11-03 DIAGNOSIS — M1990 Unspecified osteoarthritis, unspecified site: Secondary | ICD-10-CM | POA: Diagnosis not present

## 2016-11-03 DIAGNOSIS — E119 Type 2 diabetes mellitus without complications: Secondary | ICD-10-CM | POA: Diagnosis not present

## 2016-11-03 DIAGNOSIS — F329 Major depressive disorder, single episode, unspecified: Secondary | ICD-10-CM | POA: Diagnosis not present

## 2016-11-03 DIAGNOSIS — Z7951 Long term (current) use of inhaled steroids: Secondary | ICD-10-CM | POA: Diagnosis not present

## 2016-11-03 DIAGNOSIS — J449 Chronic obstructive pulmonary disease, unspecified: Secondary | ICD-10-CM | POA: Diagnosis not present

## 2016-11-03 DIAGNOSIS — Z79899 Other long term (current) drug therapy: Secondary | ICD-10-CM | POA: Diagnosis not present

## 2016-11-06 NOTE — Progress Notes (Signed)
[facility]  PATIENT: Heather Espinoza DOB: 1945-08-31   REASON FOR VISIT:follow up for back pain HISTORY FROM:patient and daughter    HISTORY OF PRESENT ILLNESS:UPDATE 08/22/2018CM Heather Espinoza, 71 year old female returns for follow-up with history of chronic low back pain. EMG nerve conduction in the past has been unremarkable. MRI with L4-5 and L5-S1 facet joint arthritis, but no nerve root compression. The patient underwent epidural injection March 1 and facet injection July 6. She claims the epidural worked much better. She remains on Cymbalta and gabapentin through her primary care physician Dr. Brigitte Pulse. She had surgery to her right hand in March. She denies any recent falls. She has a history of irritable bowel and wears depends. She returns for reevaluation. HISTORY 05/10/16 KWMs. Heather Espinoza is a 72 year old right-handed white female with a history of chronic low back pain. The patient has undergone EMG and nerve conduction study that was unremarkable, the patient has pain in the back and pain down both legs to the knees. She does have L4-5 and L5-S1 facet joint arthritis that is the likely source of her discomfort. The patient has undergone an epidural steroid injection in October 2017 that was helpful. The patient is on Cymbalta for fibromyalgia taking 90 mg daily, she is tolerating this well. She is also on gabapentin. She is planning on getting surgery on the right saddle joint of the thumb on 05/24/2016. She returns for an evaluation.   REVIEW OF SYSTEMS: Full 14 system review of systems performed and notable only for those listed, all others are neg:  Constitutional: Fatigue  Cardiovascular: neg Ear/Nose/Throat: neg  Skin: neg Eyes: neg Respiratory: neg Gastroitestinal: neg  Hematology/Lymphatic: Easy bruising Endocrine: Heat and cold intolerance Musculoskeletal: Back pain, joint pain Allergy/Immunology: neg Neurological: neg Psychiatric: neg Sleep :  neg   ALLERGIES: Allergies  Allergen Reactions  . Flagyl [Metronidazole] Shortness Of Breath and Swelling  . Adhesive [Tape] Other (See Comments)    Takes off patient's skin  . Aspirin Other (See Comments)    Stomach bleed  . Lyrica [Pregabalin] Other (See Comments)    Breathing Problems  . Latex Itching and Rash  . Phenergan [Promethazine Hcl] Other (See Comments)    Caused grogginess, altered mental status  . Prednisone Anxiety  . Sulfa Antibiotics Nausea Only  . Tetanus Toxoids Swelling    Arm Area    HOME MEDICATIONS: Outpatient Medications Prior to Visit  Medication Sig Dispense Refill  . acetaminophen (TYLENOL) 500 MG tablet Take 500 mg by mouth every 6 (six) hours as needed for pain.    Marland Kitchen albuterol (PROVENTIL HFA;VENTOLIN HFA) 108 (90 Base) MCG/ACT inhaler Inhale 2 puffs into the lungs every 6 (six) hours as needed for wheezing or shortness of breath.    Marland Kitchen aspirin EC 81 MG tablet Take 81 mg by mouth daily.    . Biotin 5000 MCG CAPS Take by mouth.    . bisacodyl (DULCOLAX) 10 MG suppository Place 1 suppository (10 mg total) rectally as needed for moderate constipation. 12 suppository 0  . dicyclomine (BENTYL) 10 MG capsule Take 1 capsule (10 mg total) by mouth 2 (two) times daily as needed for spasms. 60 capsule 2  . docusate sodium (COLACE) 100 MG capsule Take 2 capsules (200 mg total) by mouth at bedtime. 10 capsule 0  . DULoxetine (CYMBALTA) 60 MG capsule Take 1 capsule (60 mg total) by mouth 2 (two) times daily. 60 capsule 5  . esomeprazole (NEXIUM) 20 MG capsule Take 20 mg by  mouth 2 (two) times daily before a meal.    . Fluticasone-Salmeterol (ADVAIR) 250-50 MCG/DOSE AEPB Inhale 1 puff into the lungs every 12 (twelve) hours.    . gabapentin (NEURONTIN) 800 MG tablet Take 800 mg by mouth 3 (three) times daily.     Marland Kitchen levothyroxine (SYNTHROID, LEVOTHROID) 50 MCG tablet Take 50 mcg by mouth daily before breakfast.    . lisinopril (PRINIVIL,ZESTRIL) 10 MG tablet Take 10 mg  by mouth daily.    . metformin (FORTAMET) 500 MG (OSM) 24 hr tablet Take 500 mg by mouth daily with breakfast.    . ondansetron (ZOFRAN) 4 MG tablet TAKE 1 TABLET BY MOUTH TWICE DAILY AS NEEDED FOR NAUSEA AND VOMITING 30 tablet 1  . simvastatin (ZOCOR) 20 MG tablet Take 20 mg by mouth every evening.    . traZODone (DESYREL) 100 MG tablet Take 100 mg by mouth at bedtime.     . vitamin E 400 UNIT capsule Take 400 Units by mouth daily.    Marland Kitchen HYDROcodone-acetaminophen (NORCO/VICODIN) 5-325 MG per tablet Take 1 tablet by mouth as needed.     . linaclotide (LINZESS) 290 MCG CAPS capsule Take 1 capsule (290 mcg total) by mouth daily before breakfast. (Patient not taking: Reported on 11/07/2016) 90 capsule 3   No facility-administered medications prior to visit.     PAST MEDICAL HISTORY: Past Medical History:  Diagnosis Date  . Arthritis   . COPD (chronic obstructive pulmonary disease) ([place])   . Diabetes ([place])    type 2 x 18 months.  . Diverticulitis   . Diverticulosis   . Fibromyalgia   . High cholesterol   . Hypertension    4-5 yrs  . Hypothyroid   . IBS (irritable bowel syndrome)   . Kidney stone    Left Kidney  . Osteopenia   . Plantar fasciitis   . Restless leg   . Sleep apnea   . Tendonitis    Right Wrist  . UTI (lower urinary tract infection)     PAST SURGICAL HISTORY: Past Surgical History:  Procedure Laterality Date  . APPENDECTOMY    . BREAST LUMPECTOMY Right 2001  . CATARACT EXTRACTION Bilateral 2016  . CHOLECYSTECTOMY    . COLONOSCOPY N/A 12/17/2012   Procedure: COLONOSCOPY;  Surgeon: Rogene Houston, MD;  [facility];  Service: Endoscopy;  Laterality: N/A;  215  . complete hysterectomy    . Heel tumor removed    . rt elbow surgery    . TONSILLECTOMY      FAMILY HISTORY: Family History  Problem Relation Age of Onset  . Uterine cancer Mother   . Parkinson's disease Father   . Stroke Father   . Colon cancer Brother   . Lung cancer Brother   .  COPD Sister   . Diabetes Grandchild   . Asthma Son     SOCIAL HISTORY: Social History   Social History  . Marital status: Married    Spouse name: N/A  . Number of children: 2  . Years of education: College   Occupational History  . Retired    Social History Main Topics  . Smoking status: Never Smoker  . Smokeless tobacco: Never Used  . Alcohol use No  . Drug use: No  . Sexual activity: Not on file   Other Topics Concern  . Not on file   Social History Narrative   Lives at home w/ her husband   Right-handed   Occasional caffeine: decaf tea,  diet sodas     PHYSICAL EXAM  Vitals:   11/07/16 1259  BP: 121/75  Pulse: 92  Weight: 144 lb 6.4 oz (65.5 kg)  Height: 5' 1.5" (1.562 m)   Body mass index is 26.84 kg/m.  Generalized: Well developed, in no acute distress  Head: normocephalic and atraumatic,. Oropharynx benign  Neck: Supple,  Musculoskeletal: No deformity ,Range of motion to the back is full  Neurological examination   Mentation: Alert oriented to time, place, history taking. Attention span and concentration appropriate. Recent and remote memory intact.  Follows all commands speech and language fluent.   Cranial nerve II-XII: Fundoscopic exam reveals sharp disc margins.Pupils were equal round reactive to light extraocular movements were full, visual field were full on confrontational test. Facial sensation and strength were normal. hearing was intact to finger rubbing bilaterally. Uvula tongue midline. head turning and shoulder shrug were normal and symmetric.Tongue protrusion into cheek strength was normal. Motor: normal bulk and tone, full strength in the BUE, BLE, fine finger movements normal, no pronator drift. No focal weakness Sensory: normal and symmetric to light touch, in the upper and lower extremities Coordination: finger-nose-finger, heel-to-shin bilaterally, no dysmetria, no tremor Reflexes: Symmetric upper and lower plantar responses were  flexor bilaterally. Gait and Station: Rising up from seated position without assistance, normal stance,  moderate stride, good arm swing, smooth turning, able to perform tiptoe, and heel walking without difficulty. Tandem gait is unsteady. Romberg negative  DIAGNOSTIC DATA (LABS, IMAGING, TESTING) - I reviewed patient records, labs, notes, testing and imaging myself where available.   ASSESSMENT AND PLAN 1. Chronic low back pain 2. Fibromyalgia  PLAN:Will set up for repeat epidural injection at [place] Continue Gabapentin and Cymbalta at current doses F/U in  6 months with Dr. Jerline Pain, [facility], [facility], [facility] [address] [place], [place] [address] [phone]

## 2016-11-07 ENCOUNTER — Encounter: Payer: Self-pay | Admitting: Nurse Practitioner

## 2016-11-07 ENCOUNTER — Ambulatory Visit (INDEPENDENT_AMBULATORY_CARE_PROVIDER_SITE_OTHER): Payer: Medicare Other | Admitting: Nurse Practitioner

## 2016-11-07 ENCOUNTER — Encounter (INDEPENDENT_AMBULATORY_CARE_PROVIDER_SITE_OTHER): Payer: Self-pay

## 2016-11-07 VITALS — BP 121/75 | HR 92 | Ht 61.5 in | Wt 144.4 lb

## 2016-11-07 DIAGNOSIS — M797 Fibromyalgia: Secondary | ICD-10-CM

## 2016-11-07 DIAGNOSIS — M5441 Lumbago with sciatica, right side: Secondary | ICD-10-CM

## 2016-11-07 DIAGNOSIS — G8929 Other chronic pain: Secondary | ICD-10-CM

## 2016-11-07 DIAGNOSIS — M5442 Lumbago with sciatica, left side: Secondary | ICD-10-CM

## 2016-11-07 NOTE — Progress Notes (Signed)
I have read the note, and I agree with the clinical assessment and plan.  WILLIS,CHARLES KEITH   

## 2016-11-07 NOTE — Patient Instructions (Signed)
Will set up for repeat epidural injection at [facility] Continue Gabapentin and Cymbalta at current doses F/U in  6 months with dr. Jannifer Franklin

## 2016-11-08 DIAGNOSIS — Z961 Presence of intraocular lens: Secondary | ICD-10-CM | POA: Diagnosis not present

## 2016-11-08 DIAGNOSIS — H43813 Vitreous degeneration, bilateral: Secondary | ICD-10-CM | POA: Diagnosis not present

## 2016-11-08 DIAGNOSIS — E119 Type 2 diabetes mellitus without complications: Secondary | ICD-10-CM | POA: Diagnosis not present

## 2016-11-08 DIAGNOSIS — Z7984 Long term (current) use of oral hypoglycemic drugs: Secondary | ICD-10-CM | POA: Diagnosis not present

## 2016-11-15 ENCOUNTER — Other Ambulatory Visit: Payer: Self-pay | Admitting: Neurology

## 2016-11-15 DIAGNOSIS — G8929 Other chronic pain: Secondary | ICD-10-CM

## 2016-11-15 DIAGNOSIS — M5441 Lumbago with sciatica, right side: Principal | ICD-10-CM

## 2016-11-20 DIAGNOSIS — E1151 Type 2 diabetes mellitus with diabetic peripheral angiopathy without gangrene: Secondary | ICD-10-CM | POA: Diagnosis not present

## 2016-11-20 DIAGNOSIS — M25579 Pain in unspecified ankle and joints of unspecified foot: Secondary | ICD-10-CM | POA: Diagnosis not present

## 2016-11-20 DIAGNOSIS — E1140 Type 2 diabetes mellitus with diabetic neuropathy, unspecified: Secondary | ICD-10-CM | POA: Diagnosis not present

## 2016-11-20 DIAGNOSIS — L600 Ingrowing nail: Secondary | ICD-10-CM | POA: Diagnosis not present

## 2016-11-27 DIAGNOSIS — M79672 Pain in left foot: Secondary | ICD-10-CM | POA: Diagnosis not present

## 2016-11-27 DIAGNOSIS — M79671 Pain in right foot: Secondary | ICD-10-CM | POA: Diagnosis not present

## 2016-11-27 DIAGNOSIS — M25579 Pain in unspecified ankle and joints of unspecified foot: Secondary | ICD-10-CM | POA: Diagnosis not present

## 2016-11-29 DIAGNOSIS — Z87442 Personal history of urinary calculi: Secondary | ICD-10-CM | POA: Diagnosis not present

## 2016-11-29 DIAGNOSIS — Z887 Allergy status to serum and vaccine status: Secondary | ICD-10-CM | POA: Diagnosis not present

## 2016-11-29 DIAGNOSIS — Z888 Allergy status to other drugs, medicaments and biological substances status: Secondary | ICD-10-CM | POA: Diagnosis not present

## 2016-11-29 DIAGNOSIS — G2581 Restless legs syndrome: Secondary | ICD-10-CM | POA: Diagnosis not present

## 2016-11-29 DIAGNOSIS — I10 Essential (primary) hypertension: Secondary | ICD-10-CM | POA: Diagnosis not present

## 2016-11-29 DIAGNOSIS — Z886 Allergy status to analgesic agent status: Secondary | ICD-10-CM | POA: Diagnosis not present

## 2016-11-29 DIAGNOSIS — F329 Major depressive disorder, single episode, unspecified: Secondary | ICD-10-CM | POA: Diagnosis not present

## 2016-11-29 DIAGNOSIS — J449 Chronic obstructive pulmonary disease, unspecified: Secondary | ICD-10-CM | POA: Diagnosis not present

## 2016-11-29 DIAGNOSIS — Z91040 Latex allergy status: Secondary | ICD-10-CM | POA: Diagnosis not present

## 2016-11-29 DIAGNOSIS — K589 Irritable bowel syndrome without diarrhea: Secondary | ICD-10-CM | POA: Diagnosis not present

## 2016-11-29 DIAGNOSIS — M1812 Unilateral primary osteoarthritis of first carpometacarpal joint, left hand: Secondary | ICD-10-CM | POA: Diagnosis not present

## 2016-11-29 DIAGNOSIS — E119 Type 2 diabetes mellitus without complications: Secondary | ICD-10-CM | POA: Diagnosis not present

## 2016-11-29 DIAGNOSIS — M797 Fibromyalgia: Secondary | ICD-10-CM | POA: Diagnosis not present

## 2016-11-29 DIAGNOSIS — Z86718 Personal history of other venous thrombosis and embolism: Secondary | ICD-10-CM | POA: Diagnosis not present

## 2016-11-29 DIAGNOSIS — G4730 Sleep apnea, unspecified: Secondary | ICD-10-CM | POA: Diagnosis not present

## 2016-11-30 ENCOUNTER — Other Ambulatory Visit: Payer: Self-pay | Admitting: Neurology

## 2016-11-30 DIAGNOSIS — M1812 Unilateral primary osteoarthritis of first carpometacarpal joint, left hand: Secondary | ICD-10-CM | POA: Diagnosis not present

## 2016-11-30 DIAGNOSIS — G4730 Sleep apnea, unspecified: Secondary | ICD-10-CM | POA: Diagnosis not present

## 2016-11-30 DIAGNOSIS — Z4789 Encounter for other orthopedic aftercare: Secondary | ICD-10-CM | POA: Diagnosis not present

## 2016-11-30 DIAGNOSIS — E119 Type 2 diabetes mellitus without complications: Secondary | ICD-10-CM | POA: Diagnosis not present

## 2016-11-30 DIAGNOSIS — J449 Chronic obstructive pulmonary disease, unspecified: Secondary | ICD-10-CM | POA: Diagnosis not present

## 2016-11-30 DIAGNOSIS — M797 Fibromyalgia: Secondary | ICD-10-CM | POA: Diagnosis not present

## 2016-11-30 DIAGNOSIS — I10 Essential (primary) hypertension: Secondary | ICD-10-CM | POA: Diagnosis not present

## 2016-12-04 ENCOUNTER — Ambulatory Visit (INDEPENDENT_AMBULATORY_CARE_PROVIDER_SITE_OTHER): Payer: Medicare Other | Admitting: Internal Medicine

## 2016-12-04 ENCOUNTER — Encounter (INDEPENDENT_AMBULATORY_CARE_PROVIDER_SITE_OTHER): Payer: Self-pay | Admitting: *Deleted

## 2016-12-04 ENCOUNTER — Encounter (INDEPENDENT_AMBULATORY_CARE_PROVIDER_SITE_OTHER): Payer: Self-pay

## 2016-12-04 ENCOUNTER — Encounter (INDEPENDENT_AMBULATORY_CARE_PROVIDER_SITE_OTHER): Payer: Self-pay | Admitting: Internal Medicine

## 2016-12-04 VITALS — BP 110/68 | HR 67 | Temp 97.5°F | Resp 18 | Ht 61.5 in | Wt 146.7 lb

## 2016-12-04 DIAGNOSIS — K219 Gastro-esophageal reflux disease without esophagitis: Secondary | ICD-10-CM | POA: Diagnosis not present

## 2016-12-04 DIAGNOSIS — K582 Mixed irritable bowel syndrome: Secondary | ICD-10-CM

## 2016-12-04 DIAGNOSIS — R1310 Dysphagia, unspecified: Secondary | ICD-10-CM | POA: Diagnosis not present

## 2016-12-04 DIAGNOSIS — R1319 Other dysphagia: Secondary | ICD-10-CM

## 2016-12-04 MED ORDER — DICYCLOMINE HCL 10 MG PO CAPS: 10.0000 mg | ORAL_CAPSULE | Freq: Two times a day (BID) | ORAL | 2 refills | Status: DC | PRN

## 2016-12-04 MED ORDER — LUBIPROSTONE 8 MCG PO CAPS: 16.0000 ug | ORAL_CAPSULE | Freq: Every day | ORAL | 0 refills | Status: DC

## 2016-12-04 NOTE — Patient Instructions (Signed)
Physician will call with results of barium esophagogram when completed. Call with progress report when you have finished using [place] samples

## 2016-12-04 NOTE — Progress Notes (Signed)
Presenting complaint;  Follow-up for GERD and IBS. Patient complains of dysphagia.  Database and Subjective:  Patient is 71 year old Caucasian female who is here for scheduled visit accompanied by daughter. She was last seen in August 2017. She has history of GERD as well as IBS both with diarrhea and constipation but lately she's had lot more constipation than diarrhea. She usually has 3-5 stools per week. Most of her stools are hard rocks. Every now and then she may have a normal stool. She also feels bloated. She denies melena or frank bleeding. She notices blood on tissue when she strains. She is using Dulcolax suppository one as-needed basis. She is trying to eat fiber rich foods. She tries to eat 1 or 2 apples daily. She says heartburns well controlled with therapy with she's been having dysphagia with solids and pills for the last few months. She's had a few episodes of food impaction relieved with regurgitation. She has no difficulty with liquids. She points to suprasternal area site of bolus obstruction. She also complains of intermittent nausea without vomiting. She has gained 5 pounds since her last visit. She requests a refill on dicyclomine which she uses every non-[place] she has severe cramping and this medication does provide relief. She does not have any side effects with this medication.   Current Medications: Outpatient Encounter Prescriptions as of 12/04/2016  Medication Sig  . acetaminophen (TYLENOL) 500 MG tablet Take 500 mg by mouth every 6 (six) hours as needed for pain.  Marland Kitchen albuterol (PROVENTIL HFA;VENTOLIN HFA) 108 (90 Base) MCG/ACT inhaler Inhale 2 puffs into the lungs every 6 (six) hours as needed for wheezing or shortness of breath.  Marland Kitchen aspirin EC 81 MG tablet Take 81 mg by mouth daily.  . Biotin 5000 MCG CAPS Take by mouth daily.   . bisacodyl (DULCOLAX) 10 MG suppository Place 1 suppository (10 mg total) rectally as needed for moderate constipation.  . dicyclomine  (BENTYL) 10 MG capsule Take 1 capsule (10 mg total) by mouth 2 (two) times daily as needed for spasms.  Marland Kitchen docusate sodium (COLACE) 100 MG capsule Take 2 capsules (200 mg total) by mouth at bedtime.  . DULoxetine (CYMBALTA) 60 MG capsule TAKE 1 CAPSULE BY MOUTH TWICE DAILY  . esomeprazole (NEXIUM) 20 MG capsule Take 20 mg by mouth 2 (two) times daily before a meal.  . Fluticasone-Salmeterol (ADVAIR) 250-50 MCG/DOSE AEPB Inhale 1 puff into the lungs every 12 (twelve) hours.  . gabapentin (NEURONTIN) 800 MG tablet Take 800 mg by mouth 3 (three) times daily.   Marland Kitchen levothyroxine (SYNTHROID, LEVOTHROID) 50 MCG tablet Take 50 mcg by mouth daily before breakfast.  . lisinopril (PRINIVIL,ZESTRIL) 10 MG tablet Take 10 mg by mouth daily.  . metformin (FORTAMET) 500 MG (OSM) 24 hr tablet Take 500 mg by mouth daily with breakfast.  . nystatin cream (MYCOSTATIN) APPLY TO THE AFFECTED AREA(S) TWICE DAILY  . ondansetron (ZOFRAN) 4 MG tablet TAKE 1 TABLET BY MOUTH TWICE DAILY AS NEEDED FOR NAUSEA AND VOMITING  . OVER THE COUNTER MEDICATION eqaute allergy 25mg   Every 8 hours prn for itching.  Marland Kitchen oxyCODONE-acetaminophen (PERCOCET/ROXICET) 5-325 MG tablet take 1 to 2 tablets by mouth every 6 hours for 7 days  . simvastatin (ZOCOR) 20 MG tablet Take 20 mg by mouth every evening.  . traMADol (ULTRAM) 50 MG tablet Take 50 mg by mouth as needed for moderate pain.   . traZODone (DESYREL) 100 MG tablet Take 100 mg by mouth at bedtime.   Marland Kitchen  vitamin E 400 UNIT capsule Take 400 Units by mouth daily.  . [DISCONTINUED] fluconazole (DIFLUCAN) 100 MG tablet   . [DISCONTINUED] ranitidine (ZANTAC) 150 MG tablet    No facility-administered encounter medications on file as of 12/04/2016.      Objective: Blood pressure 110/68, pulse 67, temperature (!) 97.5 F (36.4 C), temperature source Oral, resp. rate 18, height 5' 1.5" (1.562 m), weight 146 lb 11.2 oz (66.5 kg). Patient is alert and in no acute distress. Conjunctiva is pink.  Sclera is nonicteric Oropharyngeal mucosa is normal. No neck masses or thyromegaly noted. Cardiac exam with regular rhythm normal S1 and S2. No murmur or gallop noted. Lungs are clear to auscultation. Abdomen is full but soft with mild tenderness at LLQ. No organomegaly or masses.  No LE edema or clubbing noted. She has dressing over her left wrist where she had surgery recently.   Assessment:  #1. Irritable bowel syndrome. She's been having both diarrhea and constipation but lately she is having predominant constipation. She is up-to-date on screening for CRC. She has taken Linzess in the past and did not do well. Will try her on Amitiza.  #2. GERD. Heartburn is well controlled with therapy.  #3. Esophageal dysphagia. She could have Schatzki's ring esophageal web or stricture. .  Plan:  Amitiza 8-16 g by mouth every morning. Few days supply given. If this medication works will send in a prescription. Dicyclomine 10 mg by mouth twice a day when necessary. Prescription sent to her pharmacy. Barium pill esophagogram. Office visit in 6 months.

## 2016-12-05 ENCOUNTER — Ambulatory Visit
Admission: RE | Admit: 2016-12-05 | Discharge: 2016-12-05 | Disposition: A | Discharge status: Home / Self Care | Payer: Medicare Other | Source: Ambulatory Visit | Attending: Neurology | Admitting: Neurology

## 2016-12-05 ENCOUNTER — Other Ambulatory Visit: Payer: Self-pay | Admitting: Neurology

## 2016-12-05 ENCOUNTER — Encounter (INDEPENDENT_AMBULATORY_CARE_PROVIDER_SITE_OTHER): Payer: Self-pay | Admitting: Internal Medicine

## 2016-12-05 DIAGNOSIS — M5441 Lumbago with sciatica, right side: Principal | ICD-10-CM

## 2016-12-05 DIAGNOSIS — G8929 Other chronic pain: Secondary | ICD-10-CM

## 2016-12-05 DIAGNOSIS — M47816 Spondylosis without myelopathy or radiculopathy, lumbar region: Secondary | ICD-10-CM | POA: Diagnosis not present

## 2016-12-05 MED ORDER — IOPAMIDOL (ISOVUE-M 200) INJECTION 41%: 1.0000 mL | Freq: Once | INTRAMUSCULAR | Status: DC

## 2016-12-05 MED ORDER — METHYLPREDNISOLONE ACETATE 40 MG/ML INJ SUSP (RADIOLOG: 120.0000 mg | Freq: Once | INTRAMUSCULAR | Status: DC

## 2016-12-05 NOTE — Discharge Instructions (Signed)

## 2016-12-06 ENCOUNTER — Other Ambulatory Visit (HOSPITAL_COMMUNITY): Payer: Self-pay | Admitting: Internal Medicine

## 2016-12-06 ENCOUNTER — Other Ambulatory Visit: Payer: Self-pay | Admitting: Neurology

## 2016-12-06 DIAGNOSIS — M545 Low back pain: Principal | ICD-10-CM

## 2016-12-06 DIAGNOSIS — G8929 Other chronic pain: Secondary | ICD-10-CM

## 2016-12-06 DIAGNOSIS — Z1231 Encounter for screening mammogram for malignant neoplasm of breast: Secondary | ICD-10-CM

## 2016-12-07 ENCOUNTER — Other Ambulatory Visit: Payer: Self-pay | Admitting: Neurology

## 2016-12-07 DIAGNOSIS — G8929 Other chronic pain: Secondary | ICD-10-CM

## 2016-12-07 DIAGNOSIS — M545 Low back pain: Principal | ICD-10-CM

## 2016-12-09 DIAGNOSIS — Z23 Encounter for immunization: Secondary | ICD-10-CM | POA: Diagnosis not present

## 2016-12-11 DIAGNOSIS — Z9889 Other specified postprocedural states: Secondary | ICD-10-CM | POA: Insufficient documentation

## 2016-12-11 DIAGNOSIS — M1812 Unilateral primary osteoarthritis of first carpometacarpal joint, left hand: Secondary | ICD-10-CM | POA: Insufficient documentation

## 2016-12-12 ENCOUNTER — Ambulatory Visit (HOSPITAL_COMMUNITY): Payer: Medicare Other

## 2016-12-17 ENCOUNTER — Other Ambulatory Visit: Payer: Self-pay | Admitting: Neurology

## 2016-12-18 ENCOUNTER — Ambulatory Visit (HOSPITAL_COMMUNITY)
Admission: RE | Admit: 2016-12-18 | Discharge: 2016-12-18 | Disposition: A | Discharge status: Home / Self Care | Payer: Medicare Other | Source: Ambulatory Visit | Attending: Internal Medicine | Admitting: Internal Medicine

## 2016-12-18 DIAGNOSIS — K219 Gastro-esophageal reflux disease without esophagitis: Secondary | ICD-10-CM | POA: Insufficient documentation

## 2016-12-18 DIAGNOSIS — R1314 Dysphagia, pharyngoesophageal phase: Secondary | ICD-10-CM | POA: Diagnosis not present

## 2016-12-18 DIAGNOSIS — R1319 Other dysphagia: Secondary | ICD-10-CM

## 2016-12-18 DIAGNOSIS — R1310 Dysphagia, unspecified: Secondary | ICD-10-CM | POA: Insufficient documentation

## 2016-12-20 ENCOUNTER — Telehealth: Payer: Self-pay | Admitting: *Deleted

## 2016-12-20 NOTE — Telephone Encounter (Signed)
Called and spoke with [place] at [facility]. I received fax from them asking for orders to be placed for bilateral lumbar facet injections.  I checked with Dr Jannifer Franklin and he placed orders on 12/07/16. I called Roberta back and relayed this. She verbalized understanding and nothing further needed.

## 2016-12-21 ENCOUNTER — Other Ambulatory Visit (INDEPENDENT_AMBULATORY_CARE_PROVIDER_SITE_OTHER): Payer: Self-pay | Admitting: *Deleted

## 2016-12-21 DIAGNOSIS — I10 Essential (primary) hypertension: Secondary | ICD-10-CM | POA: Diagnosis not present

## 2016-12-21 DIAGNOSIS — R1310 Dysphagia, unspecified: Secondary | ICD-10-CM | POA: Insufficient documentation

## 2016-12-21 DIAGNOSIS — E119 Type 2 diabetes mellitus without complications: Secondary | ICD-10-CM | POA: Diagnosis not present

## 2016-12-21 DIAGNOSIS — R1319 Other dysphagia: Secondary | ICD-10-CM

## 2016-12-25 ENCOUNTER — Ambulatory Visit (HOSPITAL_COMMUNITY)
Admission: RE | Admit: 2016-12-25 | Discharge: 2016-12-25 | Disposition: A | Discharge status: Home / Self Care | Payer: Medicare Other | Source: Ambulatory Visit | Attending: Internal Medicine | Admitting: Internal Medicine

## 2016-12-25 ENCOUNTER — Encounter (HOSPITAL_COMMUNITY)
Admission: RE | Disposition: A | Discharge status: Home / Self Care | Payer: Self-pay | Source: Ambulatory Visit | Attending: Internal Medicine

## 2016-12-25 ENCOUNTER — Encounter (HOSPITAL_COMMUNITY): Payer: Self-pay | Admitting: *Deleted

## 2016-12-25 DIAGNOSIS — K589 Irritable bowel syndrome without diarrhea: Secondary | ICD-10-CM | POA: Diagnosis not present

## 2016-12-25 DIAGNOSIS — Z7984 Long term (current) use of oral hypoglycemic drugs: Secondary | ICD-10-CM | POA: Insufficient documentation

## 2016-12-25 DIAGNOSIS — K228 Other specified diseases of esophagus: Secondary | ICD-10-CM | POA: Diagnosis not present

## 2016-12-25 DIAGNOSIS — E7800 Pure hypercholesterolemia, unspecified: Secondary | ICD-10-CM | POA: Diagnosis not present

## 2016-12-25 DIAGNOSIS — K317 Polyp of stomach and duodenum: Secondary | ICD-10-CM | POA: Insufficient documentation

## 2016-12-25 DIAGNOSIS — K2970 Gastritis, unspecified, without bleeding: Secondary | ICD-10-CM | POA: Diagnosis not present

## 2016-12-25 DIAGNOSIS — M25579 Pain in unspecified ankle and joints of unspecified foot: Secondary | ICD-10-CM | POA: Diagnosis not present

## 2016-12-25 DIAGNOSIS — G4730 Sleep apnea, unspecified: Secondary | ICD-10-CM | POA: Diagnosis not present

## 2016-12-25 DIAGNOSIS — E119 Type 2 diabetes mellitus without complications: Secondary | ICD-10-CM | POA: Insufficient documentation

## 2016-12-25 DIAGNOSIS — K319 Disease of stomach and duodenum, unspecified: Secondary | ICD-10-CM | POA: Diagnosis not present

## 2016-12-25 DIAGNOSIS — M79672 Pain in left foot: Secondary | ICD-10-CM | POA: Diagnosis not present

## 2016-12-25 DIAGNOSIS — Z7982 Long term (current) use of aspirin: Secondary | ICD-10-CM | POA: Diagnosis not present

## 2016-12-25 DIAGNOSIS — Z79891 Long term (current) use of opiate analgesic: Secondary | ICD-10-CM | POA: Diagnosis not present

## 2016-12-25 DIAGNOSIS — M797 Fibromyalgia: Secondary | ICD-10-CM | POA: Diagnosis not present

## 2016-12-25 DIAGNOSIS — J449 Chronic obstructive pulmonary disease, unspecified: Secondary | ICD-10-CM | POA: Insufficient documentation

## 2016-12-25 DIAGNOSIS — Q394 Esophageal web: Secondary | ICD-10-CM | POA: Insufficient documentation

## 2016-12-25 DIAGNOSIS — I10 Essential (primary) hypertension: Secondary | ICD-10-CM | POA: Insufficient documentation

## 2016-12-25 DIAGNOSIS — K219 Gastro-esophageal reflux disease without esophagitis: Secondary | ICD-10-CM | POA: Diagnosis not present

## 2016-12-25 DIAGNOSIS — R1319 Other dysphagia: Secondary | ICD-10-CM | POA: Insufficient documentation

## 2016-12-25 DIAGNOSIS — Z79899 Other long term (current) drug therapy: Secondary | ICD-10-CM | POA: Insufficient documentation

## 2016-12-25 DIAGNOSIS — E039 Hypothyroidism, unspecified: Secondary | ICD-10-CM | POA: Insufficient documentation

## 2016-12-25 DIAGNOSIS — R1314 Dysphagia, pharyngoesophageal phase: Secondary | ICD-10-CM | POA: Diagnosis not present

## 2016-12-25 DIAGNOSIS — M79671 Pain in right foot: Secondary | ICD-10-CM | POA: Diagnosis not present

## 2016-12-25 DIAGNOSIS — R1310 Dysphagia, unspecified: Secondary | ICD-10-CM | POA: Insufficient documentation

## 2016-12-25 HISTORY — PX: BIOPSY: SHX5522

## 2016-12-25 HISTORY — PX: ESOPHAGOGASTRODUODENOSCOPY: SHX5428

## 2016-12-25 HISTORY — PX: ESOPHAGEAL DILATION: SHX303

## 2016-12-25 LAB — GLUCOSE, CAPILLARY: Glucose-Capillary: 128 mg/dL — ABNORMAL HIGH (ref 65–99)

## 2016-12-25 SURGERY — EGD (ESOPHAGOGASTRODUODENOSCOPY)
Anesthesia: Moderate Sedation

## 2016-12-25 MED ORDER — SODIUM CHLORIDE 0.9 % IV SOLN: INTRAVENOUS | Status: DC

## 2016-12-25 MED ORDER — MIDAZOLAM HCL 5 MG/5ML IJ SOLN
INTRAMUSCULAR | Status: DC | PRN
  Administered 2016-12-25: 1 mg via INTRAVENOUS
  Administered 2016-12-25: 3 mg via INTRAVENOUS

## 2016-12-25 MED ORDER — SIMETHICONE 40 MG/0.6ML PO SUSP
ORAL | Status: DC | PRN
  Administered 2016-12-25: 08:00:00

## 2016-12-25 MED ORDER — LIDOCAINE VISCOUS 2 % MT SOLN
OROMUCOSAL | Status: AC
  Filled 2016-12-25: qty 15

## 2016-12-25 MED ORDER — MEPERIDINE HCL 50 MG/ML IJ SOLN
INTRAMUSCULAR | Status: DC | PRN
  Administered 2016-12-25 (×2): 25 mg via INTRAVENOUS

## 2016-12-25 MED ORDER — MEPERIDINE HCL 50 MG/ML IJ SOLN
INTRAMUSCULAR | Status: AC
  Filled 2016-12-25: qty 1

## 2016-12-25 MED ORDER — MIDAZOLAM HCL 5 MG/5ML IJ SOLN
INTRAMUSCULAR | Status: AC
  Filled 2016-12-25: qty 10

## 2016-12-25 MED ORDER — LIDOCAINE VISCOUS 2 % MT SOLN
OROMUCOSAL | Status: DC | PRN
  Administered 2016-12-25: 4 mL via OROMUCOSAL

## 2016-12-25 NOTE — Op Note (Signed)
[facility] Patient Name: Heather Espinoza Procedure Date: 12/25/2016 7:17 AM MRN: [id number] Date of Birth: January 19, 1946 Attending MD: Hildred Laser , MD CSN: [id number] Age: 71 Admit Type: Outpatient Procedure:                Upper GI endoscopy Indications:              Esophageal dysphagia, Gastro-esophageal reflux                            disease Providers:                Hildred Laser, MD, Otis Peak B. Sharon Seller, RN, Rosina Lowenstein, RN Referring MD:             Monico Blitz MD Medicines:                Lidocaine jelly, Meperidine 50 mg IV, Midazolam 4                            mg IV Complications:            No immediate complications. Estimated Blood Loss:     Estimated blood loss was minimal. Procedure:                Pre-Anesthesia Assessment:                           - Prior to the procedure, a History and Physical                            was performed, and patient medications and                            allergies were reviewed. The patient's tolerance of                            previous anesthesia was also reviewed. The risks                            and benefits of the procedure and the sedation                            options and risks were discussed with the patient.                            All questions were answered, and informed consent                            was obtained. Prior Anticoagulants: The patient                            last took aspirin 5 days prior to the procedure.  ASA Grade Assessment: III - A patient with severe                            systemic disease. After reviewing the risks and                            benefits, the patient was deemed in satisfactory                            condition to undergo the procedure.                           After obtaining informed consent, the endoscope was                            passed under direct vision. Throughout the                             procedure, the patient's blood pressure, pulse, and                            oxygen saturations were monitored continuously. The                            [id number] ([id number]) scope was introduced through the                            mouth, and advanced to the second part of duodenum.                            The upper GI endoscopy was accomplished without                            difficulty. The patient tolerated the procedure                            well. Scope In: 7:40:25 AM Scope Out: 7:51:50 AM Total Procedure Duration: 0 hours 11 minutes 25 seconds  Findings:      A web was found in the proximal esophagus just below UES. The scope was       withdrawn. Dilation was performed with a Maloney dilator with mild       resistance at 66 Fr. The dilation site was examined following endoscope       reinsertion and showed complete resolution of luminal narrowing and no       perforation.      The exam of the esophagus was otherwise normal.      The Z-line was irregular and was found 36 cm from the incisors.      Multiple 2 to 7 mm sessile polyps were found in the gastric fundus and       in the gastric body. Biopsies were taken with a cold forceps for       histology. The pathology specimen was placed into Bottle Number 1.      Patchy mild inflammation characterized by congestion (edema), erythema  and granularity was found in the gastric antrum and in the prepyloric       region of the stomach. Biopsies were taken with a cold forceps for       histology. The pathology specimen was placed into Bottle Number 1.      The exam of the stomach was otherwise normal.      The duodenal bulb and second portion of the duodenum were normal. Impression:               - Web in the proximal esophagus. Dilated and                            disrupted.                           - Z-line irregular, 36 cm from the incisors.                           - Multiple gastric polyps.  Biopsied.                           - Gastritis. Biopsied.                           - Normal duodenal bulb and second portion of the                            duodenum. Moderate Sedation:      Moderate (conscious) sedation was administered by the endoscopy nurse       and supervised by the endoscopist. The following parameters were       monitored: oxygen saturation, heart rate, blood pressure, CO2       capnography and response to care. Total physician intraservice time was       17 minutes. Recommendation:           - Patient has a contact number available for                            emergencies. The signs and symptoms of potential                            delayed complications were discussed with the                            patient. Return to normal activities tomorrow.                            Written discharge instructions were provided to the                            patient.                           - Resume previous diet today.                           - Continue present medications.                           -  No aspirin, ibuprofen, naproxen, or other                            non-steroidal anti-inflammatory drugs for 3 days.                           - Await pathology results. Procedure Code(s):        --- Professional ---                           [phone], Esophagogastroduodenoscopy, flexible,                            transoral; with biopsy, single or multiple                           43450, Dilation of esophagus, by unguided sound or                            bougie, single or multiple passes                           99152, Moderate sedation services provided by the                            same physician or other qualified health care                            professional performing the diagnostic or                            therapeutic service that the sedation supports,                            requiring the presence of an independent trained                             observer to assist in the monitoring of the                            patient's level of consciousness and physiological                            status; initial 15 minutes of intraservice time,                            patient age 72 years or older Diagnosis Code(s):        --- Professional ---                           Q39.4, Esophageal web                           K22.8, Other specified diseases of esophagus  K31.7, Polyp of stomach and duodenum                           K29.70, Gastritis, unspecified, without bleeding                           R13.14, Dysphagia, pharyngoesophageal phase                           K21.9, Gastro-esophageal reflux disease without                            esophagitis CPT copyright 2016 American Medical Association. All rights reserved. The codes documented in this report are preliminary and upon coder review may  be revised to meet current compliance requirements. Hildred Laser, MD Hildred Laser, MD 12/25/2016 8:05:07 AM This report has been signed electronically. Number of Addenda: 0

## 2016-12-25 NOTE — Discharge Instructions (Signed)
No aspirin for 3 days.  Resume on Friday 12/28/16 Resume other medications as before. Resume usual diet. No driving for 24 hours. Physician will call with biopsy results.  Esophagogastroduodenoscopy, Care After Refer to this sheet in the next few weeks. These instructions provide you with information about caring for yourself after your procedure. Your health care provider may also give you more specific instructions. Your treatment has been planned according to current medical practices, but problems sometimes occur. Call your health care provider if you have any problems or questions after your procedure.  Dr Laural Golden:  [phone] What can I expect after the procedure? After the procedure, it is common to have:  A sore throat.  Nausea.  Bloating.  Dizziness.  Fatigue.  Follow these instructions at home:  Do not eat or drink anything until the numbing medicine (local anesthetic) has worn off and your gag reflex has returned. You will know that the local anesthetic has worn off when you can swallow comfortably.  Do not drive for 24 hours if you received a medicine to help you relax (sedative).  If your health care provider took a tissue sample for testing during the procedure, make sure to get your test results. This is your responsibility. Ask your health care provider or the department performing the test when your results will be ready.  Keep all follow-up visits as told by your health care provider. This is important. Contact a health care provider if:  You cannot stop coughing.  You are not urinating.  You are urinating less than usual. Get help right away if:  You have trouble swallowing.  You cannot eat or drink.  You have throat or chest pain that gets worse.  You faint.  You have nausea or vomiting.  You have chills.  You have a fever.  You have severe abdominal pain.  You have black, tarry, or bloody stools. This information is not intended to replace  advice given to you by your health care provider. Make sure you discuss any questions you have with your health care provider. Document Released: 02/20/2012 Document Revised: 08/11/2015 Document Reviewed: 01/27/2015 Elsevier Interactive Patient Education  [employer].

## 2016-12-25 NOTE — H&P (Signed)
Heather Espinoza is an 71 y.o. female.   Chief Complaint: patient is here for EGD and ED. HPI: patient is 71 year old Caucasian female with chronic GERD who presents with 6 month history of intermittent solid food dysphagia. She points to the mid sternal and suprasternal area soft bolus obstruction. She had barium study last week revealed no obvious abnormality but there was holdup of barium pill at GE junction. She has chronic hoarseness. She denies nausea vomiting abdominal pain or melena.  Past Medical History:  Diagnosis Date  . Arthritis   . COPD (chronic obstructive pulmonary disease) ([place])   . Diabetes ([place])    type 2 x 18 months.  . Diverticulitis   . Diverticulosis   . Fibromyalgia   . High cholesterol   . Hypertension    4-5 yrs  . Hypothyroid   . IBS (irritable bowel syndrome)   . Kidney stone    Left Kidney  . Osteopenia   . Plantar fasciitis   . Restless leg   . Sleep apnea   . Tendonitis    Right Wrist  . UTI (lower urinary tract infection)     Past Surgical History:  Procedure Laterality Date  . APPENDECTOMY    . BREAST LUMPECTOMY Right 2001  . CATARACT EXTRACTION Bilateral 2016  . CHOLECYSTECTOMY    . COLONOSCOPY N/A 12/17/2012   Procedure: COLONOSCOPY;  Surgeon: Rogene Houston, MD;  Location: [facility];  Service: Endoscopy;  Laterality: N/A;  215  . complete hysterectomy    . Heel tumor removed    . rt elbow surgery    . TONSILLECTOMY      Family History  Problem Relation Age of Onset  . Uterine cancer Mother   . Parkinson's disease Father   . Stroke Father   . Colon cancer Brother   . Lung cancer Brother   . COPD Sister   . Diabetes Grandchild   . Asthma Son    Social History:  reports that she has never smoked. She has never used smokeless tobacco. She reports that she does not drink alcohol or use drugs.  Allergies:  Allergies  Allergen Reactions  . Flagyl [Metronidazole] Shortness Of Breath and Swelling  . Adhesive [Tape] Other (See  Comments)    Takes off patient's skin  . Aspirin Other (See Comments)    Stomach bleed  . Lyrica [Pregabalin] Other (See Comments)    Breathing Problems  . Latex Itching and Rash  . Phenergan [Promethazine Hcl] Other (See Comments)    Caused grogginess, altered mental status  . Prednisone Anxiety  . Sulfa Antibiotics Nausea Only  . Tetanus Toxoids Swelling    Arm Area    Medications Prior to Admission  Medication Sig Dispense Refill  . acetaminophen (TYLENOL) 500 MG tablet Take 500 mg by mouth every 6 (six) hours as needed for pain.    Marland Kitchen Apoaequorin (PREVAGEN PO) Take 1 tablet by mouth daily.    Marland Kitchen aspirin EC 81 MG tablet Take 81 mg by mouth daily.    . Biotin 5000 MCG CAPS Take by mouth daily.     . cholecalciferol (VITAMIN D) 1000 units tablet Take 1,000 Units by mouth 2 (two) times daily.    Marland Kitchen dicyclomine (BENTYL) 10 MG capsule Take 1 capsule (10 mg total) by mouth 2 (two) times daily as needed for spasms. 60 capsule 2  . DULoxetine (CYMBALTA) 60 MG capsule TAKE 1 CAPSULE BY MOUTH TWICE DAILY 60 capsule 5  . esomeprazole (NEXIUM)  20 MG capsule Take 20 mg by mouth 2 (two) times daily before a meal.    . Fluticasone-Salmeterol (ADVAIR) 250-50 MCG/DOSE AEPB Inhale 1 puff into the lungs every 12 (twelve) hours.    . gabapentin (NEURONTIN) 800 MG tablet Take 800 mg by mouth 3 (three) times daily.     Marland Kitchen levothyroxine (SYNTHROID, LEVOTHROID) 50 MCG tablet Take 50 mcg by mouth daily before breakfast.    . lisinopril (PRINIVIL,ZESTRIL) 10 MG tablet Take 10 mg by mouth daily.    Marland Kitchen lubiprostone (AMITIZA) 8 MCG capsule Take 2 capsules (16 mcg total) by mouth daily with breakfast. (Patient taking differently: Take 16 mcg by mouth daily as needed (for constipation.). ) 48 capsule 0  . metformin (FORTAMET) 500 MG (OSM) 24 hr tablet Take 500 mg by mouth daily with breakfast.    . ondansetron (ZOFRAN) 4 MG tablet TAKE 1 TABLET BY MOUTH TWICE DAILY AS NEEDED FOR NAUSEA AND VOMITING 30 tablet 1  .  oxyCODONE-acetaminophen (PERCOCET/ROXICET) 5-325 MG tablet Take 1 tablet by mouth every 8 (eight) hours as needed (for hand pain.).    Marland Kitchen simvastatin (ZOCOR) 20 MG tablet Take 20 mg by mouth every evening.    . traMADol (ULTRAM) 50 MG tablet Take 50 mg by mouth every 6 (six) hours as needed (for pain.).     Marland Kitchen traZODone (DESYREL) 100 MG tablet Take 100 mg by mouth at bedtime.     . vitamin E 400 UNIT capsule Take 400 Units by mouth daily.    Marland Kitchen albuterol (PROVENTIL HFA;VENTOLIN HFA) 108 (90 Base) MCG/ACT inhaler Inhale 2 puffs into the lungs every 6 (six) hours as needed for wheezing or shortness of breath.    . bisacodyl (DULCOLAX) 10 MG suppository Place 1 suppository (10 mg total) rectally as needed for moderate constipation. 12 suppository 0  . diphenhydrAMINE (BENADRYL) 25 mg capsule Take 25 mg by mouth every 6 (six) hours as needed (for itchy rash.).    Marland Kitchen docusate sodium (COLACE) 100 MG capsule Take 2 capsules (200 mg total) by mouth at bedtime. (Patient taking differently: Take 200 mg by mouth daily as needed (for constipation.). ) 10 capsule 0  . nystatin cream (MYCOSTATIN) APPLY TO THE AFFECTED AREA(S) TWICE DAILY AS NEEDED FOR ITCHY SKIN UNDER BREAST  0    No results found for this or any previous visit (from the past 62 hour(s)). No results found.  ROS  Blood pressure 104/65, pulse 74, temperature 99 F (37.2 C), temperature source Oral, resp. rate 17, SpO2 100 %. Physical Exam  Constitutional: She appears well-developed and well-nourished.  HENT:  Mouth/Throat: Oropharynx is clear and moist.  Eyes: Conjunctivae are normal.  Neck: No thyromegaly present.  Cardiovascular: Normal rate, regular rhythm and normal heart sounds.   No murmur heard. Respiratory: Effort normal and breath sounds normal.  GI: Soft. She exhibits no distension and no mass. There is no tenderness.  Musculoskeletal: She exhibits no edema.  Lymphadenopathy:    She has no cervical adenopathy.  Neurological:  She is alert.  Skin: Skin is warm and dry.     Assessment/Plan Solid food dysphagia in patient with chronic GERD. Abnormal barium pill esophagogram. EGD with ED.  Hildred Laser, MD 12/25/2016, 7:30 AM

## 2016-12-28 ENCOUNTER — Encounter (HOSPITAL_COMMUNITY): Payer: Self-pay | Admitting: Internal Medicine

## 2017-01-01 DIAGNOSIS — Z0000 Encounter for general adult medical examination without abnormal findings: Secondary | ICD-10-CM | POA: Diagnosis not present

## 2017-01-01 DIAGNOSIS — Z1331 Encounter for screening for depression: Secondary | ICD-10-CM | POA: Diagnosis not present

## 2017-01-01 DIAGNOSIS — R5383 Other fatigue: Secondary | ICD-10-CM | POA: Diagnosis not present

## 2017-01-01 DIAGNOSIS — E559 Vitamin D deficiency, unspecified: Secondary | ICD-10-CM | POA: Diagnosis not present

## 2017-01-01 DIAGNOSIS — Z1211 Encounter for screening for malignant neoplasm of colon: Secondary | ICD-10-CM | POA: Diagnosis not present

## 2017-01-01 DIAGNOSIS — Z6828 Body mass index (BMI) 28.0-28.9, adult: Secondary | ICD-10-CM | POA: Diagnosis not present

## 2017-01-01 DIAGNOSIS — Z7189 Other specified counseling: Secondary | ICD-10-CM | POA: Diagnosis not present

## 2017-01-01 DIAGNOSIS — J449 Chronic obstructive pulmonary disease, unspecified: Secondary | ICD-10-CM | POA: Diagnosis not present

## 2017-01-01 DIAGNOSIS — Z1339 Encounter for screening examination for other mental health and behavioral disorders: Secondary | ICD-10-CM | POA: Diagnosis not present

## 2017-01-01 DIAGNOSIS — Z299 Encounter for prophylactic measures, unspecified: Secondary | ICD-10-CM | POA: Diagnosis not present

## 2017-01-01 DIAGNOSIS — E7800 Pure hypercholesterolemia, unspecified: Secondary | ICD-10-CM | POA: Diagnosis not present

## 2017-01-01 DIAGNOSIS — Z79899 Other long term (current) drug therapy: Secondary | ICD-10-CM | POA: Diagnosis not present

## 2017-01-01 DIAGNOSIS — E039 Hypothyroidism, unspecified: Secondary | ICD-10-CM | POA: Diagnosis not present

## 2017-01-02 ENCOUNTER — Other Ambulatory Visit (INDEPENDENT_AMBULATORY_CARE_PROVIDER_SITE_OTHER): Payer: Self-pay | Admitting: Internal Medicine

## 2017-01-07 ENCOUNTER — Ambulatory Visit (HOSPITAL_COMMUNITY): Payer: Medicare Other

## 2017-01-08 DIAGNOSIS — M1812 Unilateral primary osteoarthritis of first carpometacarpal joint, left hand: Secondary | ICD-10-CM | POA: Diagnosis not present

## 2017-01-14 ENCOUNTER — Ambulatory Visit (HOSPITAL_COMMUNITY)
Admission: RE | Admit: 2017-01-14 | Discharge: 2017-01-14 | Disposition: A | Discharge status: Home / Self Care | Payer: Medicare Other | Source: Ambulatory Visit | Attending: Internal Medicine | Admitting: Internal Medicine

## 2017-01-14 ENCOUNTER — Encounter (HOSPITAL_COMMUNITY): Payer: Self-pay

## 2017-01-14 DIAGNOSIS — M25632 Stiffness of left wrist, not elsewhere classified: Secondary | ICD-10-CM | POA: Diagnosis not present

## 2017-01-14 DIAGNOSIS — Z1231 Encounter for screening mammogram for malignant neoplasm of breast: Secondary | ICD-10-CM | POA: Diagnosis not present

## 2017-01-14 DIAGNOSIS — M79642 Pain in left hand: Secondary | ICD-10-CM | POA: Diagnosis not present

## 2017-01-14 DIAGNOSIS — M79645 Pain in left finger(s): Secondary | ICD-10-CM | POA: Diagnosis not present

## 2017-01-14 DIAGNOSIS — M25642 Stiffness of left hand, not elsewhere classified: Secondary | ICD-10-CM | POA: Diagnosis not present

## 2017-01-16 DIAGNOSIS — M79645 Pain in left finger(s): Secondary | ICD-10-CM | POA: Diagnosis not present

## 2017-01-16 DIAGNOSIS — M79642 Pain in left hand: Secondary | ICD-10-CM | POA: Diagnosis not present

## 2017-01-16 DIAGNOSIS — M25642 Stiffness of left hand, not elsewhere classified: Secondary | ICD-10-CM | POA: Diagnosis not present

## 2017-01-16 DIAGNOSIS — M25632 Stiffness of left wrist, not elsewhere classified: Secondary | ICD-10-CM | POA: Diagnosis not present

## 2017-01-17 DIAGNOSIS — E119 Type 2 diabetes mellitus without complications: Secondary | ICD-10-CM | POA: Diagnosis not present

## 2017-01-17 DIAGNOSIS — M79645 Pain in left finger(s): Secondary | ICD-10-CM | POA: Diagnosis not present

## 2017-01-17 DIAGNOSIS — I10 Essential (primary) hypertension: Secondary | ICD-10-CM | POA: Diagnosis not present

## 2017-01-17 DIAGNOSIS — M25632 Stiffness of left wrist, not elsewhere classified: Secondary | ICD-10-CM | POA: Diagnosis not present

## 2017-01-17 DIAGNOSIS — M79642 Pain in left hand: Secondary | ICD-10-CM | POA: Diagnosis not present

## 2017-01-17 DIAGNOSIS — M25642 Stiffness of left hand, not elsewhere classified: Secondary | ICD-10-CM | POA: Diagnosis not present

## 2017-01-21 DIAGNOSIS — M25632 Stiffness of left wrist, not elsewhere classified: Secondary | ICD-10-CM | POA: Diagnosis not present

## 2017-01-21 DIAGNOSIS — M25642 Stiffness of left hand, not elsewhere classified: Secondary | ICD-10-CM | POA: Diagnosis not present

## 2017-01-21 DIAGNOSIS — M79642 Pain in left hand: Secondary | ICD-10-CM | POA: Diagnosis not present

## 2017-01-21 DIAGNOSIS — M79645 Pain in left finger(s): Secondary | ICD-10-CM | POA: Diagnosis not present

## 2017-01-25 DIAGNOSIS — K5730 Diverticulosis of large intestine without perforation or abscess without bleeding: Secondary | ICD-10-CM | POA: Diagnosis not present

## 2017-01-25 DIAGNOSIS — Z7951 Long term (current) use of inhaled steroids: Secondary | ICD-10-CM | POA: Diagnosis not present

## 2017-01-25 DIAGNOSIS — Z86718 Personal history of other venous thrombosis and embolism: Secondary | ICD-10-CM | POA: Diagnosis not present

## 2017-01-25 DIAGNOSIS — K5780 Diverticulitis of intestine, part unspecified, with perforation and abscess without bleeding: Secondary | ICD-10-CM | POA: Diagnosis not present

## 2017-01-25 DIAGNOSIS — N200 Calculus of kidney: Secondary | ICD-10-CM | POA: Diagnosis not present

## 2017-01-25 DIAGNOSIS — Z7984 Long term (current) use of oral hypoglycemic drugs: Secondary | ICD-10-CM | POA: Diagnosis not present

## 2017-01-25 DIAGNOSIS — E119 Type 2 diabetes mellitus without complications: Secondary | ICD-10-CM | POA: Diagnosis not present

## 2017-01-25 DIAGNOSIS — J449 Chronic obstructive pulmonary disease, unspecified: Secondary | ICD-10-CM | POA: Diagnosis not present

## 2017-01-25 DIAGNOSIS — I10 Essential (primary) hypertension: Secondary | ICD-10-CM | POA: Diagnosis not present

## 2017-01-25 DIAGNOSIS — M797 Fibromyalgia: Secondary | ICD-10-CM | POA: Diagnosis not present

## 2017-01-25 DIAGNOSIS — K5792 Diverticulitis of intestine, part unspecified, without perforation or abscess without bleeding: Secondary | ICD-10-CM | POA: Diagnosis not present

## 2017-01-25 DIAGNOSIS — M1990 Unspecified osteoarthritis, unspecified site: Secondary | ICD-10-CM | POA: Diagnosis not present

## 2017-01-25 DIAGNOSIS — Z79899 Other long term (current) drug therapy: Secondary | ICD-10-CM | POA: Diagnosis not present

## 2017-01-25 DIAGNOSIS — Z7982 Long term (current) use of aspirin: Secondary | ICD-10-CM | POA: Diagnosis not present

## 2017-01-25 DIAGNOSIS — Z8249 Family history of ischemic heart disease and other diseases of the circulatory system: Secondary | ICD-10-CM | POA: Diagnosis not present

## 2017-01-26 DIAGNOSIS — K5730 Diverticulosis of large intestine without perforation or abscess without bleeding: Secondary | ICD-10-CM | POA: Diagnosis not present

## 2017-01-26 DIAGNOSIS — N200 Calculus of kidney: Secondary | ICD-10-CM | POA: Diagnosis not present

## 2017-01-29 DIAGNOSIS — L309 Dermatitis, unspecified: Secondary | ICD-10-CM | POA: Diagnosis not present

## 2017-01-29 DIAGNOSIS — M1812 Unilateral primary osteoarthritis of first carpometacarpal joint, left hand: Secondary | ICD-10-CM | POA: Diagnosis not present

## 2017-01-29 DIAGNOSIS — L308 Other specified dermatitis: Secondary | ICD-10-CM | POA: Diagnosis not present

## 2017-01-29 DIAGNOSIS — M25579 Pain in unspecified ankle and joints of unspecified foot: Secondary | ICD-10-CM | POA: Diagnosis not present

## 2017-01-29 DIAGNOSIS — D485 Neoplasm of uncertain behavior of skin: Secondary | ICD-10-CM | POA: Diagnosis not present

## 2017-01-29 DIAGNOSIS — L0100 Impetigo, unspecified: Secondary | ICD-10-CM | POA: Diagnosis not present

## 2017-01-29 DIAGNOSIS — M79671 Pain in right foot: Secondary | ICD-10-CM | POA: Diagnosis not present

## 2017-01-29 DIAGNOSIS — M79672 Pain in left foot: Secondary | ICD-10-CM | POA: Diagnosis not present

## 2017-02-05 DIAGNOSIS — L600 Ingrowing nail: Secondary | ICD-10-CM | POA: Diagnosis not present

## 2017-02-05 DIAGNOSIS — M25579 Pain in unspecified ankle and joints of unspecified foot: Secondary | ICD-10-CM | POA: Diagnosis not present

## 2017-02-05 DIAGNOSIS — E1140 Type 2 diabetes mellitus with diabetic neuropathy, unspecified: Secondary | ICD-10-CM | POA: Diagnosis not present

## 2017-02-05 DIAGNOSIS — E1151 Type 2 diabetes mellitus with diabetic peripheral angiopathy without gangrene: Secondary | ICD-10-CM | POA: Diagnosis not present

## 2017-02-13 DIAGNOSIS — M79645 Pain in left finger(s): Secondary | ICD-10-CM | POA: Diagnosis not present

## 2017-02-16 ENCOUNTER — Other Ambulatory Visit (INDEPENDENT_AMBULATORY_CARE_PROVIDER_SITE_OTHER): Payer: Self-pay | Admitting: Internal Medicine

## 2017-02-27 DIAGNOSIS — E1142 Type 2 diabetes mellitus with diabetic polyneuropathy: Secondary | ICD-10-CM | POA: Diagnosis not present

## 2017-02-27 DIAGNOSIS — Z299 Encounter for prophylactic measures, unspecified: Secondary | ICD-10-CM | POA: Diagnosis not present

## 2017-02-27 DIAGNOSIS — E1165 Type 2 diabetes mellitus with hyperglycemia: Secondary | ICD-10-CM | POA: Diagnosis not present

## 2017-02-27 DIAGNOSIS — J029 Acute pharyngitis, unspecified: Secondary | ICD-10-CM | POA: Diagnosis not present

## 2017-02-27 DIAGNOSIS — M549 Dorsalgia, unspecified: Secondary | ICD-10-CM | POA: Diagnosis not present

## 2017-02-27 DIAGNOSIS — J449 Chronic obstructive pulmonary disease, unspecified: Secondary | ICD-10-CM | POA: Diagnosis not present

## 2017-02-27 DIAGNOSIS — Z6828 Body mass index (BMI) 28.0-28.9, adult: Secondary | ICD-10-CM | POA: Diagnosis not present

## 2017-02-27 DIAGNOSIS — L309 Dermatitis, unspecified: Secondary | ICD-10-CM | POA: Diagnosis not present

## 2017-02-27 DIAGNOSIS — I10 Essential (primary) hypertension: Secondary | ICD-10-CM | POA: Diagnosis not present

## 2017-02-28 DIAGNOSIS — E119 Type 2 diabetes mellitus without complications: Secondary | ICD-10-CM | POA: Diagnosis not present

## 2017-02-28 DIAGNOSIS — Z91048 Other nonmedicinal substance allergy status: Secondary | ICD-10-CM | POA: Diagnosis not present

## 2017-02-28 DIAGNOSIS — K589 Irritable bowel syndrome without diarrhea: Secondary | ICD-10-CM | POA: Diagnosis not present

## 2017-02-28 DIAGNOSIS — K219 Gastro-esophageal reflux disease without esophagitis: Secondary | ICD-10-CM | POA: Diagnosis not present

## 2017-02-28 DIAGNOSIS — Z823 Family history of stroke: Secondary | ICD-10-CM | POA: Diagnosis not present

## 2017-02-28 DIAGNOSIS — Z882 Allergy status to sulfonamides status: Secondary | ICD-10-CM | POA: Diagnosis not present

## 2017-02-28 DIAGNOSIS — Z8249 Family history of ischemic heart disease and other diseases of the circulatory system: Secondary | ICD-10-CM | POA: Diagnosis not present

## 2017-02-28 DIAGNOSIS — G4730 Sleep apnea, unspecified: Secondary | ICD-10-CM | POA: Diagnosis not present

## 2017-02-28 DIAGNOSIS — M797 Fibromyalgia: Secondary | ICD-10-CM | POA: Diagnosis not present

## 2017-02-28 DIAGNOSIS — Z888 Allergy status to other drugs, medicaments and biological substances status: Secondary | ICD-10-CM | POA: Diagnosis not present

## 2017-02-28 DIAGNOSIS — Z887 Allergy status to serum and vaccine status: Secondary | ICD-10-CM | POA: Diagnosis not present

## 2017-02-28 DIAGNOSIS — F329 Major depressive disorder, single episode, unspecified: Secondary | ICD-10-CM | POA: Diagnosis not present

## 2017-02-28 DIAGNOSIS — J449 Chronic obstructive pulmonary disease, unspecified: Secondary | ICD-10-CM | POA: Diagnosis not present

## 2017-02-28 DIAGNOSIS — Z96692 Finger-joint replacement of left hand: Secondary | ICD-10-CM | POA: Diagnosis not present

## 2017-02-28 DIAGNOSIS — Z86718 Personal history of other venous thrombosis and embolism: Secondary | ICD-10-CM | POA: Diagnosis not present

## 2017-02-28 DIAGNOSIS — Z836 Family history of other diseases of the respiratory system: Secondary | ICD-10-CM | POA: Diagnosis not present

## 2017-02-28 DIAGNOSIS — Z886 Allergy status to analgesic agent status: Secondary | ICD-10-CM | POA: Diagnosis not present

## 2017-02-28 DIAGNOSIS — Z9049 Acquired absence of other specified parts of digestive tract: Secondary | ICD-10-CM | POA: Diagnosis not present

## 2017-02-28 DIAGNOSIS — Z91040 Latex allergy status: Secondary | ICD-10-CM | POA: Diagnosis not present

## 2017-02-28 DIAGNOSIS — E785 Hyperlipidemia, unspecified: Secondary | ICD-10-CM | POA: Diagnosis not present

## 2017-02-28 DIAGNOSIS — M189 Osteoarthritis of first carpometacarpal joint, unspecified: Secondary | ICD-10-CM | POA: Diagnosis not present

## 2017-02-28 DIAGNOSIS — Z90710 Acquired absence of both cervix and uterus: Secondary | ICD-10-CM | POA: Diagnosis not present

## 2017-03-01 DIAGNOSIS — E119 Type 2 diabetes mellitus without complications: Secondary | ICD-10-CM | POA: Diagnosis not present

## 2017-03-01 DIAGNOSIS — M1812 Unilateral primary osteoarthritis of first carpometacarpal joint, left hand: Secondary | ICD-10-CM | POA: Diagnosis not present

## 2017-03-01 DIAGNOSIS — F329 Major depressive disorder, single episode, unspecified: Secondary | ICD-10-CM | POA: Diagnosis not present

## 2017-03-01 DIAGNOSIS — M189 Osteoarthritis of first carpometacarpal joint, unspecified: Secondary | ICD-10-CM | POA: Diagnosis not present

## 2017-03-01 DIAGNOSIS — M797 Fibromyalgia: Secondary | ICD-10-CM | POA: Diagnosis not present

## 2017-03-01 DIAGNOSIS — J449 Chronic obstructive pulmonary disease, unspecified: Secondary | ICD-10-CM | POA: Diagnosis not present

## 2017-03-01 DIAGNOSIS — Z96692 Finger-joint replacement of left hand: Secondary | ICD-10-CM | POA: Diagnosis not present

## 2017-03-05 DIAGNOSIS — M79671 Pain in right foot: Secondary | ICD-10-CM | POA: Diagnosis not present

## 2017-03-05 DIAGNOSIS — M79672 Pain in left foot: Secondary | ICD-10-CM | POA: Diagnosis not present

## 2017-03-05 DIAGNOSIS — M25579 Pain in unspecified ankle and joints of unspecified foot: Secondary | ICD-10-CM | POA: Diagnosis not present

## 2017-03-08 DIAGNOSIS — E119 Type 2 diabetes mellitus without complications: Secondary | ICD-10-CM | POA: Diagnosis not present

## 2017-03-08 DIAGNOSIS — I10 Essential (primary) hypertension: Secondary | ICD-10-CM | POA: Diagnosis not present

## 2017-03-21 DIAGNOSIS — M1812 Unilateral primary osteoarthritis of first carpometacarpal joint, left hand: Secondary | ICD-10-CM | POA: Diagnosis not present

## 2017-04-02 DIAGNOSIS — M25579 Pain in unspecified ankle and joints of unspecified foot: Secondary | ICD-10-CM | POA: Diagnosis not present

## 2017-04-02 DIAGNOSIS — M79672 Pain in left foot: Secondary | ICD-10-CM | POA: Diagnosis not present

## 2017-04-02 DIAGNOSIS — M79671 Pain in right foot: Secondary | ICD-10-CM | POA: Diagnosis not present

## 2017-04-03 DIAGNOSIS — M1812 Unilateral primary osteoarthritis of first carpometacarpal joint, left hand: Secondary | ICD-10-CM | POA: Diagnosis not present

## 2017-04-29 ENCOUNTER — Other Ambulatory Visit (INDEPENDENT_AMBULATORY_CARE_PROVIDER_SITE_OTHER): Payer: Self-pay | Admitting: Internal Medicine

## 2017-05-07 DIAGNOSIS — Z9889 Other specified postprocedural states: Secondary | ICD-10-CM | POA: Diagnosis not present

## 2017-05-07 DIAGNOSIS — L600 Ingrowing nail: Secondary | ICD-10-CM | POA: Diagnosis not present

## 2017-05-07 DIAGNOSIS — M25579 Pain in unspecified ankle and joints of unspecified foot: Secondary | ICD-10-CM | POA: Diagnosis not present

## 2017-05-07 DIAGNOSIS — M1812 Unilateral primary osteoarthritis of first carpometacarpal joint, left hand: Secondary | ICD-10-CM | POA: Diagnosis not present

## 2017-05-07 DIAGNOSIS — E1140 Type 2 diabetes mellitus with diabetic neuropathy, unspecified: Secondary | ICD-10-CM | POA: Diagnosis not present

## 2017-05-07 DIAGNOSIS — E1151 Type 2 diabetes mellitus with diabetic peripheral angiopathy without gangrene: Secondary | ICD-10-CM | POA: Diagnosis not present

## 2017-05-16 DIAGNOSIS — E119 Type 2 diabetes mellitus without complications: Secondary | ICD-10-CM | POA: Diagnosis not present

## 2017-05-16 DIAGNOSIS — I10 Essential (primary) hypertension: Secondary | ICD-10-CM | POA: Diagnosis not present

## 2017-05-17 ENCOUNTER — Ambulatory Visit (INDEPENDENT_AMBULATORY_CARE_PROVIDER_SITE_OTHER): Payer: Medicare Other | Admitting: Neurology

## 2017-05-17 ENCOUNTER — Other Ambulatory Visit: Payer: Self-pay

## 2017-05-17 ENCOUNTER — Ambulatory Visit
Admission: RE | Admit: 2017-05-17 | Discharge: 2017-05-17 | Disposition: A | Discharge status: Home / Self Care | Payer: Medicare Other | Source: Ambulatory Visit | Attending: Neurology | Admitting: Neurology

## 2017-05-17 ENCOUNTER — Telehealth: Payer: Self-pay | Admitting: Neurology

## 2017-05-17 ENCOUNTER — Encounter: Payer: Self-pay | Admitting: Neurology

## 2017-05-17 VITALS — BP 98/54 | HR 83 | Wt 143.5 lb

## 2017-05-17 DIAGNOSIS — G8929 Other chronic pain: Secondary | ICD-10-CM

## 2017-05-17 DIAGNOSIS — M5441 Lumbago with sciatica, right side: Secondary | ICD-10-CM

## 2017-05-17 DIAGNOSIS — M797 Fibromyalgia: Secondary | ICD-10-CM

## 2017-05-17 DIAGNOSIS — M5442 Lumbago with sciatica, left side: Secondary | ICD-10-CM

## 2017-05-17 DIAGNOSIS — M25552 Pain in left hip: Secondary | ICD-10-CM | POA: Diagnosis not present

## 2017-05-17 DIAGNOSIS — M1612 Unilateral primary osteoarthritis, left hip: Secondary | ICD-10-CM | POA: Diagnosis not present

## 2017-05-17 MED ORDER — DULOXETINE HCL 60 MG PO CPEP: 60.0000 mg | ORAL_CAPSULE | Freq: Two times a day (BID) | ORAL | 3 refills | Status: DC

## 2017-05-17 NOTE — Telephone Encounter (Signed)
  I called the patient.  The patient has degenerative changes in the lumbar spine, SI joints and both hips, the patient is mainly having left hip pain currently.  She wishes me to send this report to her orthopedic surgeon, Dr. Case.  XR left hip 05/17/17:  IMPRESSION: Degenerative changes lumbar spine, both SI joints, both hips. No acute bony abnormality identified. No evidence of fracture or dislocation.

## 2017-05-17 NOTE — Progress Notes (Signed)
Reason for visit: Low back pain  Heather Espinoza is an 72 y.o. female  History of present illness:  Ms. Grenier is a 72 year old right-handed white female with a history of chronic low back pain associated with predominantly L4-5 facet joint arthritis.  The patient has had a facet joint injection in September 2018 and she has gained good improvement with her back pain until just 2 weeks ago.  Once again, she is having some discomfort down the left leg to the knee which is worse with standing.  Her pain seemed to get much better with sitting or lying down.  She denies any weakness of the legs.  She has not had any falls.  She has recently had some onset of some left hip discomfort as well.  The patient returns to the office today for an evaluation.  The patient does have a history of diabetes.  Past Medical History:  Diagnosis Date  . Arthritis   . COPD (chronic obstructive pulmonary disease) ([place])   . Diabetes ([place])    type 2 x 18 months.  . Diverticulitis   . Diverticulosis   . Fibromyalgia   . High cholesterol   . Hypertension    4-5 yrs  . Hypothyroid   . IBS (irritable bowel syndrome)   . Kidney stone    Left Kidney  . Osteopenia   . Plantar fasciitis   . Restless leg   . Sleep apnea   . Tendonitis    Right Wrist  . UTI (lower urinary tract infection)     Past Surgical History:  Procedure Laterality Date  . APPENDECTOMY    . BIOPSY  12/25/2016   Procedure: BIOPSY;  Surgeon: Rogene Houston, MD;  [facility]: [facility];  Service: Endoscopy;;  gastric   . BREAST LUMPECTOMY Right 2001  . CATARACT EXTRACTION Bilateral 2016  . CHOLECYSTECTOMY    . COLONOSCOPY N/A 12/17/2012   Procedure: COLONOSCOPY;  Surgeon: Rogene Houston, MD;  Location: [facility];  Service: Endoscopy;  Laterality: N/A;  215  . complete hysterectomy    . ESOPHAGEAL DILATION N/A 12/25/2016   Procedure: ESOPHAGEAL DILATION;  Surgeon: Rogene Houston, MD;  [facility];  Service:  Endoscopy;  Laterality: N/A;  . ESOPHAGOGASTRODUODENOSCOPY N/A 12/25/2016   Procedure: ESOPHAGOGASTRODUODENOSCOPY (EGD);  Surgeon: Rogene Houston, MD;  [facility];  Service: Endoscopy;  Laterality: N/A;  730  . Heel tumor removed    . rt elbow surgery    . TONSILLECTOMY      Family History  Problem Relation Age of Onset  . Uterine cancer Mother   . Parkinson's disease Father   . Stroke Father   . Colon cancer Brother   . Lung cancer Brother   . COPD Sister   . Diabetes Grandchild   . Asthma Son     Social history:  reports that  has never smoked. she has never used smokeless tobacco. She reports that she does not drink alcohol or use drugs.    Allergies  Allergen Reactions  . Flagyl [Metronidazole] Shortness Of Breath and Swelling  . Adhesive [Tape] Other (See Comments)    Takes off patient's skin  . Aspirin Other (See Comments)    Stomach bleed  . Lyrica [Pregabalin] Other (See Comments)    Breathing Problems  . Latex Itching and Rash  . Phenergan [Promethazine Hcl] Other (See Comments)    Caused grogginess, altered mental status  . Prednisone Anxiety  .  Sulfa Antibiotics Nausea Only  . Tetanus Toxoids Swelling    Arm Area    Medications:  Prior to Admission medications   Medication Sig Start Date End Date Taking? Authorizing Provider  acetaminophen (TYLENOL) 500 MG tablet Take 500 mg by mouth every 6 (six) hours as needed for pain.   Yes [provider]  albuterol (PROVENTIL HFA;VENTOLIN HFA) 108 (90 Base) MCG/ACT inhaler Inhale 2 puffs into the lungs every 6 (six) hours as needed for wheezing or shortness of breath.   Yes [provider]  Apoaequorin (PREVAGEN PO) Take 1 tablet by mouth daily.   Yes [provider]  aspirin EC 81 MG tablet Take 1 tablet (81 mg total) by mouth daily. 12/28/16  Yes Rehman, Mechele Dawley, MD  Biotin 5000 MCG CAPS Take by mouth daily.    Yes [provider]  bisacodyl (DULCOLAX) 10 MG  suppository Place 1 suppository (10 mg total) rectally as needed for moderate constipation. 01/04/15  Yes Rehman, Mechele Dawley, MD  cholecalciferol (VITAMIN D) 1000 units tablet Take 1,000 Units by mouth 2 (two) times daily.   Yes [provider]  dicyclomine (BENTYL) 10 MG capsule Take 1 capsule (10 mg total) by mouth 2 (two) times daily as needed for spasms. 12/04/16  Yes Rehman, Mechele Dawley, MD  diphenhydrAMINE (BENADRYL) 25 mg capsule Take 25 mg by mouth every 6 (six) hours as needed (for itchy rash.).   Yes [provider]  docusate sodium (COLACE) 100 MG capsule Take 2 capsules (200 mg total) by mouth at bedtime. Patient taking differently: Take 200 mg by mouth daily as needed (for constipation.).  01/04/15  Yes Rehman, Mechele Dawley, MD  DULoxetine (CYMBALTA) 60 MG capsule Take 1 capsule (60 mg total) by mouth 2 (two) times daily. 05/17/17  Yes Kathrynn Ducking, MD  esomeprazole (NEXIUM) 20 MG capsule Take 20 mg by mouth 2 (two) times daily before a meal.   Yes [provider]  Fluticasone-Salmeterol (ADVAIR) 250-50 MCG/DOSE AEPB Inhale 1 puff into the lungs every 12 (twelve) hours.   Yes [provider]  gabapentin (NEURONTIN) 800 MG tablet Take 800 mg by mouth daily.  10/06/15  Yes [provider]  levothyroxine (SYNTHROID, LEVOTHROID) 50 MCG tablet Take 50 mcg by mouth daily before breakfast.   Yes [provider]  lisinopril (PRINIVIL,ZESTRIL) 10 MG tablet Take 10 mg by mouth daily.   Yes [provider]  lubiprostone (AMITIZA) 8 MCG capsule Take 2 capsules (16 mcg total) by mouth daily with breakfast. Patient taking differently: Take 16 mcg by mouth daily as needed (for constipation.).  12/04/16  Yes Rehman, Mechele Dawley, MD  metformin (FORTAMET) 500 MG (OSM) 24 hr tablet Take 500 mg by mouth daily with breakfast.   Yes [provider]  nystatin cream (MYCOSTATIN) APPLY TO THE AFFECTED AREA(S) TWICE DAILY AS NEEDED FOR ITCHY SKIN UNDER  BREAST 11/01/16  Yes [provider]  ondansetron (ZOFRAN) 4 MG tablet TAKE 1 TABLET BY MOUTH TWICE DAILY AS NEEDED FOR NAUSEA AND VOMITING 04/29/17  Yes Rehman, Mechele Dawley, MD  oxyCODONE-acetaminophen (PERCOCET/ROXICET) 5-325 MG tablet Take 1 tablet by mouth every 8 (eight) hours as needed (for hand pain.).   Yes [provider]  simvastatin (ZOCOR) 20 MG tablet Take 20 mg by mouth every evening.   Yes [provider]  traMADol (ULTRAM) 50 MG tablet Take 50 mg by mouth every 6 (six) hours as needed (for pain.).  10/19/16  Yes [provider]  traZODone (DESYREL) 100 MG tablet Take 100 mg by mouth at bedtime.  09/27/15  Yes [provider]  vitamin E 400 UNIT capsule Take 400 Units by mouth daily.   Yes [provider]    ROS:  Out of a complete 14 system review of symptoms, the patient complains only of the following symptoms, and all other reviewed systems are negative.  Back pain, left hip pain  Blood pressure (!) 98/54, pulse 83, weight 143 lb 8 oz (65.1 kg).  Physical Exam  General: The patient is alert and cooperative at the time of the examination.  Neuromuscular: Rotation of the left hip produces significant discomfort.  Skin: No significant peripheral edema is noted.   Neurologic Exam  Mental status: The patient is alert and oriented x 3 at the time of the examination. The patient has apparent normal recent and remote memory, with an apparently normal attention span and concentration ability.   Cranial nerves: Facial symmetry is present. Speech is normal, no aphasia or dysarthria is noted. Extraocular movements are full. Visual fields are full.  Motor: The patient has good strength in all 4 extremities.  Sensory examination: Soft touch sensation is symmetric on the face, arms, and legs.  Coordination: The patient has good finger-nose-finger and heel-to-shin bilaterally.  Gait and station: The patient has a normal gait.  Tandem gait is normal. Romberg is negative. No drift is seen.  Reflexes: Deep tendon reflexes are symmetric, reflexes are somewhat brisk.   Assessment/Plan:  1.  Low back pain, facet joint arthritis  2.  Left hip pain  The patient is to be sent for an x-ray of the left hip.  She may have degenerative arthritis.  She will be sent back for a facet joint injection procedure, this seemed to help her low back pain for almost 6 months.  She will follow-up through this office in 6 months.  Jill Alexanders MD 05/17/2017 11:31 AM  [facility] [address] [place], [place] [address]  Phone [phone] Fax [phone]

## 2017-05-18 ENCOUNTER — Other Ambulatory Visit (INDEPENDENT_AMBULATORY_CARE_PROVIDER_SITE_OTHER): Payer: Self-pay | Admitting: Internal Medicine

## 2017-05-20 ENCOUNTER — Other Ambulatory Visit: Payer: Self-pay | Admitting: Neurology

## 2017-05-20 DIAGNOSIS — M545 Low back pain: Principal | ICD-10-CM

## 2017-05-20 DIAGNOSIS — G8929 Other chronic pain: Secondary | ICD-10-CM

## 2017-06-04 ENCOUNTER — Encounter (INDEPENDENT_AMBULATORY_CARE_PROVIDER_SITE_OTHER): Payer: Self-pay | Admitting: Internal Medicine

## 2017-06-04 ENCOUNTER — Ambulatory Visit (INDEPENDENT_AMBULATORY_CARE_PROVIDER_SITE_OTHER): Payer: Medicare Other | Admitting: Internal Medicine

## 2017-06-04 VITALS — BP 108/66 | HR 68 | Temp 97.9°F | Resp 18 | Ht 61.5 in | Wt 140.3 lb

## 2017-06-04 DIAGNOSIS — M25579 Pain in unspecified ankle and joints of unspecified foot: Secondary | ICD-10-CM | POA: Diagnosis not present

## 2017-06-04 DIAGNOSIS — Z6828 Body mass index (BMI) 28.0-28.9, adult: Secondary | ICD-10-CM | POA: Diagnosis not present

## 2017-06-04 DIAGNOSIS — K582 Mixed irritable bowel syndrome: Secondary | ICD-10-CM | POA: Diagnosis not present

## 2017-06-04 DIAGNOSIS — R350 Frequency of micturition: Secondary | ICD-10-CM | POA: Diagnosis not present

## 2017-06-04 DIAGNOSIS — K219 Gastro-esophageal reflux disease without esophagitis: Secondary | ICD-10-CM | POA: Diagnosis not present

## 2017-06-04 DIAGNOSIS — G4733 Obstructive sleep apnea (adult) (pediatric): Secondary | ICD-10-CM | POA: Diagnosis not present

## 2017-06-04 DIAGNOSIS — E1142 Type 2 diabetes mellitus with diabetic polyneuropathy: Secondary | ICD-10-CM | POA: Diagnosis not present

## 2017-06-04 DIAGNOSIS — M79672 Pain in left foot: Secondary | ICD-10-CM | POA: Diagnosis not present

## 2017-06-04 DIAGNOSIS — M79671 Pain in right foot: Secondary | ICD-10-CM | POA: Diagnosis not present

## 2017-06-04 DIAGNOSIS — E039 Hypothyroidism, unspecified: Secondary | ICD-10-CM | POA: Diagnosis not present

## 2017-06-04 DIAGNOSIS — E1165 Type 2 diabetes mellitus with hyperglycemia: Secondary | ICD-10-CM | POA: Diagnosis not present

## 2017-06-04 DIAGNOSIS — Z789 Other specified health status: Secondary | ICD-10-CM | POA: Diagnosis not present

## 2017-06-04 DIAGNOSIS — R1319 Other dysphagia: Secondary | ICD-10-CM

## 2017-06-04 DIAGNOSIS — J449 Chronic obstructive pulmonary disease, unspecified: Secondary | ICD-10-CM | POA: Diagnosis not present

## 2017-06-04 DIAGNOSIS — R1310 Dysphagia, unspecified: Secondary | ICD-10-CM | POA: Diagnosis not present

## 2017-06-04 DIAGNOSIS — I10 Essential (primary) hypertension: Secondary | ICD-10-CM | POA: Diagnosis not present

## 2017-06-04 DIAGNOSIS — Z299 Encounter for prophylactic measures, unspecified: Secondary | ICD-10-CM | POA: Diagnosis not present

## 2017-06-04 NOTE — Progress Notes (Signed)
Presenting complaint;  Follow-up for GERD dysphagia and IBS.  Subjective:  Patient is 72 year old Caucasian female who has chronic GERD history of esophageal dysphagia as well as IBS both with diarrhea and constipation who is here for scheduled visit.  She was last seen in September 2018.  She underwent esophagogastroduodenoscopy on in October 2018.  She was noted to have esophageal web which was dilated by passing 54 [place] Maloney dilator resulting in mucosal disruption.  She also had multiple gastric polyps and gastritis.  Gastric polyps were fundic gland polyps and gastric biopsy revealed no evidence of H. pylori infection.  Patient now reports relapse of her dysphagia.  She has difficulty with some pills and food.  She points to the suprasternal area as site of bolus obstruction.  She says she had no difficulty whatsoever for 3 months after dilation.  She has not had an episode of food impaction.  She has heartburn no more than once a week.  She denies nausea or vomiting.  She has daily bowel movement but she generally passes small pieces and rocks.  However 2-3 times a week she has a good bowel movement.  She states she was treated for diverticulitis in November 2018.  She was [facility] in [facility] and underwent CT.  She takes gabapentin for fibromyalgia.  She feels dicyclomine is helping and she has some dry mouth because of it.   Current Medications: Outpatient Encounter Medications as of 06/04/2017  Medication Sig  . acetaminophen (TYLENOL) 500 MG tablet Take 500 mg by mouth every 6 (six) hours as needed for pain.  Marland Kitchen albuterol (PROVENTIL HFA;VENTOLIN HFA) 108 (90 Base) MCG/ACT inhaler Inhale 2 puffs into the lungs every 6 (six) hours as needed for wheezing or shortness of breath.  . Apoaequorin (PREVAGEN PO) Take 1 tablet by mouth daily.  Marland Kitchen aspirin EC 81 MG tablet Take 1 tablet (81 mg total) by mouth daily.  . Biotin 5000 MCG CAPS Take by mouth daily.   . bisacodyl  (DULCOLAX) 10 MG suppository Place 1 suppository (10 mg total) rectally as needed for moderate constipation.  . cholecalciferol (VITAMIN D) 1000 units tablet Take 1,000 Units by mouth 2 (two) times daily.  Marland Kitchen dicyclomine (BENTYL) 10 MG capsule Take 1 capsule (10 mg total) by mouth 2 (two) times daily as needed for spasms.  . diphenhydrAMINE (BENADRYL) 25 mg capsule Take 25 mg by mouth every 6 (six) hours as needed (for itchy rash.).  Marland Kitchen docusate sodium (COLACE) 100 MG capsule Take 2 capsules (200 mg total) by mouth at bedtime. (Patient taking differently: Take 200 mg by mouth daily as needed (for constipation.). )  . DULoxetine (CYMBALTA) 60 MG capsule Take 1 capsule (60 mg total) by mouth 2 (two) times daily.  Marland Kitchen esomeprazole (NEXIUM) 20 MG capsule Take 20 mg by mouth 2 (two) times daily before a meal.  . Fluticasone-Salmeterol (ADVAIR) 250-50 MCG/DOSE AEPB Inhale 1 puff into the lungs every 12 (twelve) hours.  . gabapentin (NEURONTIN) 800 MG tablet Take 800 mg by mouth daily.   Marland Kitchen levothyroxine (SYNTHROID, LEVOTHROID) 50 MCG tablet Take 50 mcg by mouth daily before breakfast.  . lisinopril (PRINIVIL,ZESTRIL) 10 MG tablet Take 10 mg by mouth daily.  Marland Kitchen lubiprostone (AMITIZA) 8 MCG capsule Take 2 capsules (16 mcg total) by mouth daily with breakfast. (Patient taking differently: Take 16 mcg by mouth daily as needed (for constipation.). )  . metformin (FORTAMET) 500 MG (OSM) 24 hr tablet Take 500 mg by  mouth daily with breakfast.  . ondansetron (ZOFRAN) 4 MG tablet TAKE 1 TABLET BY MOUTH TWICE DAILY AS NEEDED FOR NAUSEA AND VOMITING  . oxyCODONE-acetaminophen (PERCOCET/ROXICET) 5-325 MG tablet Take 1 tablet by mouth every 8 (eight) hours as needed (for hand pain.).  Marland Kitchen simvastatin (ZOCOR) 20 MG tablet Take 20 mg by mouth every evening.  . traMADol (ULTRAM) 50 MG tablet Take 50 mg by mouth every 6 (six) hours as needed (for pain.).   Marland Kitchen traZODone (DESYREL) 100 MG tablet Take 100 mg by mouth at bedtime.   .  vitamin E 400 UNIT capsule Take 400 Units by mouth daily.  . [DISCONTINUED] nystatin cream (MYCOSTATIN) APPLY TO THE AFFECTED AREA(S) TWICE DAILY AS NEEDED FOR ITCHY SKIN UNDER BREAST   No facility-administered encounter medications on file as of 06/04/2017.      Objective: Blood pressure 108/66, pulse 68, temperature 97.9 F (36.6 C), temperature source Oral, resp. rate 18, height 5' 1.5" (1.562 m), weight 140 lb 4.8 oz (63.6 kg). Patient is alert and in no acute distress. Conjunctiva is pink. Sclera is nonicteric Oropharyngeal mucosa is normal. No neck masses or thyromegaly noted. Cardiac exam with regular rhythm normal S1 and S2. No murmur or gallop noted. Lungs are clear to auscultation. Abdomen is symmetrical.  Bowel sounds are normal.  She has mild tenderness at LLQ on deep palpation.  No organomegaly or masses. No LE edema or clubbing noted.  Labs/studies Results:  Images of barium pill esophagogram from 12-2016 reviewed. She has subtle narrowing/valve in the region of cervical esophagus.  However barium pill got large at GE junction and would not pass distally.  She also had GE reflux in supine position.  Assessment:  #1.  Chronic GERD.  She is doing well with therapy.  #2.  Esophageal dysphagia.  She underwent esophageal dilation in October 2018.  She was felt to have esophageal valve and cervical esophagus.  She responded to esophageal dilation but only for 3 months.  On review of her barium study reveals she may also have subtle stricture in the segment.  Etiology is benign.  Will monitor her symptom and if dysphagia gets worse we will proceed with repeat dilation.  #3.  Irritable bowel syndrome.  She is doing well with therapy.   #4.  Patient is high risk for colorectal carcinoma because of family history of CRC in brother who is in his 40s at the time of diagnosis.  Last colonoscopy was in October 2014.    Plan:  High risk colonoscopy to be scheduled in October  2019. Patient will call if dysphagia worsens in which case we will proceed with EGD with ED. Office visit in 1 year.

## 2017-06-04 NOTE — Patient Instructions (Signed)
Colonoscopy to be scheduled in October 2019. Notify if swallowing difficulty worsens.

## 2017-06-06 ENCOUNTER — Ambulatory Visit
Admission: RE | Admit: 2017-06-06 | Discharge: 2017-06-06 | Disposition: A | Discharge status: Home / Self Care | Payer: Medicare Other | Source: Ambulatory Visit | Attending: Neurology | Admitting: Neurology

## 2017-06-06 DIAGNOSIS — G8929 Other chronic pain: Secondary | ICD-10-CM

## 2017-06-06 DIAGNOSIS — M545 Low back pain: Principal | ICD-10-CM

## 2017-06-06 DIAGNOSIS — M4696 Unspecified inflammatory spondylopathy, lumbar region: Secondary | ICD-10-CM | POA: Diagnosis not present

## 2017-06-06 MED ORDER — METHYLPREDNISOLONE ACETATE 40 MG/ML INJ SUSP (RADIOLOG
120.0000 mg | Freq: Once | INTRAMUSCULAR | Status: AC
  Administered 2017-06-06: 120 mg via INTRA_ARTICULAR

## 2017-06-06 MED ORDER — IOPAMIDOL (ISOVUE-M 200) INJECTION 41%
1.0000 mL | Freq: Once | INTRAMUSCULAR | Status: AC
  Administered 2017-06-06: 1 mL via INTRA_ARTICULAR

## 2017-06-18 ENCOUNTER — Telehealth: Payer: Self-pay | Admitting: *Deleted

## 2017-06-18 NOTE — Telephone Encounter (Signed)
Faxed signed orders back to [facility] re: rx orders for: "lidocaine5% ointment, sig: apply 2.5-5gm to affected area(s) BID for pain (dispense: 300gm/30days), fluocinonide 0.1% cream sig: apply 2G to affected area every 12 hr for inflammation, dispense: 120gm/30days, calcipotriene 0.005% cream sig: apply 2G to affected area BID, dispense: 120gm/30days. Refills: PRN. Fax: [phone]. Received fax confirmation.  Added meds to med list.

## 2017-06-18 NOTE — Telephone Encounter (Signed)
Ronalee Belts with [employer] is calling to advise did not get previous fax. I told him fax was confirmed but he said please fax to [phone] Ref#[id number].

## 2017-06-18 NOTE — Telephone Encounter (Signed)
Re-faxed orders to [phone]. Received fax confirmation.

## 2017-06-19 ENCOUNTER — Other Ambulatory Visit (INDEPENDENT_AMBULATORY_CARE_PROVIDER_SITE_OTHER): Payer: Self-pay | Admitting: Internal Medicine

## 2017-06-23 DIAGNOSIS — Z23 Encounter for immunization: Secondary | ICD-10-CM | POA: Diagnosis not present

## 2017-07-09 DIAGNOSIS — M25579 Pain in unspecified ankle and joints of unspecified foot: Secondary | ICD-10-CM | POA: Diagnosis not present

## 2017-07-09 DIAGNOSIS — M79672 Pain in left foot: Secondary | ICD-10-CM | POA: Diagnosis not present

## 2017-07-09 DIAGNOSIS — M79671 Pain in right foot: Secondary | ICD-10-CM | POA: Diagnosis not present

## 2017-07-18 ENCOUNTER — Other Ambulatory Visit (INDEPENDENT_AMBULATORY_CARE_PROVIDER_SITE_OTHER): Payer: Self-pay | Admitting: Internal Medicine

## 2017-07-29 DIAGNOSIS — M1812 Unilateral primary osteoarthritis of first carpometacarpal joint, left hand: Secondary | ICD-10-CM | POA: Diagnosis not present

## 2017-08-03 DIAGNOSIS — R42 Dizziness and giddiness: Secondary | ICD-10-CM | POA: Diagnosis not present

## 2017-08-03 DIAGNOSIS — S60222A Contusion of left hand, initial encounter: Secondary | ICD-10-CM | POA: Diagnosis not present

## 2017-08-03 DIAGNOSIS — I10 Essential (primary) hypertension: Secondary | ICD-10-CM | POA: Diagnosis not present

## 2017-08-03 DIAGNOSIS — X58XXXA Exposure to other specified factors, initial encounter: Secondary | ICD-10-CM | POA: Diagnosis not present

## 2017-08-03 DIAGNOSIS — R55 Syncope and collapse: Secondary | ICD-10-CM | POA: Diagnosis not present

## 2017-08-03 DIAGNOSIS — Z8249 Family history of ischemic heart disease and other diseases of the circulatory system: Secondary | ICD-10-CM | POA: Diagnosis not present

## 2017-08-03 DIAGNOSIS — Z86718 Personal history of other venous thrombosis and embolism: Secondary | ICD-10-CM | POA: Diagnosis not present

## 2017-08-03 DIAGNOSIS — M47892 Other spondylosis, cervical region: Secondary | ICD-10-CM | POA: Diagnosis not present

## 2017-08-03 DIAGNOSIS — Z7984 Long term (current) use of oral hypoglycemic drugs: Secondary | ICD-10-CM | POA: Diagnosis not present

## 2017-08-03 DIAGNOSIS — Z7982 Long term (current) use of aspirin: Secondary | ICD-10-CM | POA: Diagnosis not present

## 2017-08-03 DIAGNOSIS — E119 Type 2 diabetes mellitus without complications: Secondary | ICD-10-CM | POA: Diagnosis not present

## 2017-08-03 DIAGNOSIS — J449 Chronic obstructive pulmonary disease, unspecified: Secondary | ICD-10-CM | POA: Diagnosis not present

## 2017-08-03 DIAGNOSIS — R300 Dysuria: Secondary | ICD-10-CM | POA: Diagnosis not present

## 2017-08-03 DIAGNOSIS — Z79899 Other long term (current) drug therapy: Secondary | ICD-10-CM | POA: Diagnosis not present

## 2017-08-03 DIAGNOSIS — M47812 Spondylosis without myelopathy or radiculopathy, cervical region: Secondary | ICD-10-CM | POA: Diagnosis not present

## 2017-08-03 DIAGNOSIS — M797 Fibromyalgia: Secondary | ICD-10-CM | POA: Diagnosis not present

## 2017-08-03 DIAGNOSIS — I700 Atherosclerosis of aorta: Secondary | ICD-10-CM | POA: Diagnosis not present

## 2017-08-03 DIAGNOSIS — M1990 Unspecified osteoarthritis, unspecified site: Secondary | ICD-10-CM | POA: Diagnosis not present

## 2017-08-03 DIAGNOSIS — G2581 Restless legs syndrome: Secondary | ICD-10-CM | POA: Diagnosis not present

## 2017-08-03 DIAGNOSIS — Z7951 Long term (current) use of inhaled steroids: Secondary | ICD-10-CM | POA: Diagnosis not present

## 2017-08-05 DIAGNOSIS — I10 Essential (primary) hypertension: Secondary | ICD-10-CM | POA: Diagnosis not present

## 2017-08-05 DIAGNOSIS — R55 Syncope and collapse: Secondary | ICD-10-CM | POA: Diagnosis not present

## 2017-08-05 DIAGNOSIS — N390 Urinary tract infection, site not specified: Secondary | ICD-10-CM | POA: Diagnosis not present

## 2017-08-05 DIAGNOSIS — Z6828 Body mass index (BMI) 28.0-28.9, adult: Secondary | ICD-10-CM | POA: Diagnosis not present

## 2017-08-05 DIAGNOSIS — E1165 Type 2 diabetes mellitus with hyperglycemia: Secondary | ICD-10-CM | POA: Diagnosis not present

## 2017-08-05 DIAGNOSIS — J449 Chronic obstructive pulmonary disease, unspecified: Secondary | ICD-10-CM | POA: Diagnosis not present

## 2017-08-05 DIAGNOSIS — K219 Gastro-esophageal reflux disease without esophagitis: Secondary | ICD-10-CM | POA: Diagnosis not present

## 2017-08-05 DIAGNOSIS — M79643 Pain in unspecified hand: Secondary | ICD-10-CM | POA: Diagnosis not present

## 2017-08-05 DIAGNOSIS — Z299 Encounter for prophylactic measures, unspecified: Secondary | ICD-10-CM | POA: Diagnosis not present

## 2017-08-15 ENCOUNTER — Other Ambulatory Visit: Payer: Self-pay | Admitting: Neurology

## 2017-08-15 DIAGNOSIS — G8929 Other chronic pain: Secondary | ICD-10-CM

## 2017-08-15 DIAGNOSIS — M545 Low back pain: Principal | ICD-10-CM

## 2017-08-16 ENCOUNTER — Other Ambulatory Visit: Payer: Self-pay | Admitting: Neurology

## 2017-08-16 DIAGNOSIS — G8929 Other chronic pain: Secondary | ICD-10-CM

## 2017-08-16 DIAGNOSIS — M545 Low back pain: Principal | ICD-10-CM

## 2017-08-19 DIAGNOSIS — R55 Syncope and collapse: Secondary | ICD-10-CM | POA: Diagnosis not present

## 2017-08-19 DIAGNOSIS — R600 Localized edema: Secondary | ICD-10-CM | POA: Diagnosis not present

## 2017-08-20 DIAGNOSIS — R55 Syncope and collapse: Secondary | ICD-10-CM | POA: Diagnosis not present

## 2017-08-23 ENCOUNTER — Other Ambulatory Visit (INDEPENDENT_AMBULATORY_CARE_PROVIDER_SITE_OTHER): Payer: Self-pay | Admitting: Internal Medicine

## 2017-08-28 ENCOUNTER — Ambulatory Visit
Admission: RE | Admit: 2017-08-28 | Discharge: 2017-08-28 | Disposition: A | Discharge status: Home / Self Care | Payer: Medicare Other | Source: Ambulatory Visit | Attending: Neurology | Admitting: Neurology

## 2017-08-28 DIAGNOSIS — G8929 Other chronic pain: Secondary | ICD-10-CM

## 2017-08-28 DIAGNOSIS — M545 Low back pain: Principal | ICD-10-CM

## 2017-08-28 DIAGNOSIS — M4696 Unspecified inflammatory spondylopathy, lumbar region: Secondary | ICD-10-CM | POA: Diagnosis not present

## 2017-08-28 MED ORDER — METHYLPREDNISOLONE ACETATE 40 MG/ML INJ SUSP (RADIOLOG
120.0000 mg | Freq: Once | INTRAMUSCULAR | Status: AC
  Administered 2017-08-28: 120 mg via INTRA_ARTICULAR

## 2017-08-28 MED ORDER — IOPAMIDOL (ISOVUE-M 200) INJECTION 41%
1.0000 mL | Freq: Once | INTRAMUSCULAR | Status: AC
  Administered 2017-08-28: 1 mL via INTRA_ARTICULAR

## 2017-09-02 DIAGNOSIS — M79672 Pain in left foot: Secondary | ICD-10-CM | POA: Diagnosis not present

## 2017-09-02 DIAGNOSIS — M25579 Pain in unspecified ankle and joints of unspecified foot: Secondary | ICD-10-CM | POA: Diagnosis not present

## 2017-09-02 DIAGNOSIS — M79671 Pain in right foot: Secondary | ICD-10-CM | POA: Diagnosis not present

## 2017-09-09 DIAGNOSIS — I10 Essential (primary) hypertension: Secondary | ICD-10-CM | POA: Diagnosis not present

## 2017-09-09 DIAGNOSIS — Z299 Encounter for prophylactic measures, unspecified: Secondary | ICD-10-CM | POA: Diagnosis not present

## 2017-09-09 DIAGNOSIS — Z6827 Body mass index (BMI) 27.0-27.9, adult: Secondary | ICD-10-CM | POA: Diagnosis not present

## 2017-09-09 DIAGNOSIS — E1165 Type 2 diabetes mellitus with hyperglycemia: Secondary | ICD-10-CM | POA: Diagnosis not present

## 2017-09-09 DIAGNOSIS — J449 Chronic obstructive pulmonary disease, unspecified: Secondary | ICD-10-CM | POA: Diagnosis not present

## 2017-09-09 DIAGNOSIS — E1142 Type 2 diabetes mellitus with diabetic polyneuropathy: Secondary | ICD-10-CM | POA: Diagnosis not present

## 2017-09-20 ENCOUNTER — Other Ambulatory Visit (INDEPENDENT_AMBULATORY_CARE_PROVIDER_SITE_OTHER): Payer: Self-pay | Admitting: Internal Medicine

## 2017-10-07 DIAGNOSIS — M25579 Pain in unspecified ankle and joints of unspecified foot: Secondary | ICD-10-CM | POA: Diagnosis not present

## 2017-10-07 DIAGNOSIS — M79672 Pain in left foot: Secondary | ICD-10-CM | POA: Diagnosis not present

## 2017-10-07 DIAGNOSIS — M79671 Pain in right foot: Secondary | ICD-10-CM | POA: Diagnosis not present

## 2017-10-09 DIAGNOSIS — R55 Syncope and collapse: Secondary | ICD-10-CM | POA: Diagnosis not present

## 2017-10-09 DIAGNOSIS — J438 Other emphysema: Secondary | ICD-10-CM | POA: Diagnosis not present

## 2017-10-10 ENCOUNTER — Other Ambulatory Visit (INDEPENDENT_AMBULATORY_CARE_PROVIDER_SITE_OTHER): Payer: Self-pay | Admitting: Internal Medicine

## 2017-10-14 DIAGNOSIS — E119 Type 2 diabetes mellitus without complications: Secondary | ICD-10-CM | POA: Diagnosis not present

## 2017-10-14 DIAGNOSIS — I10 Essential (primary) hypertension: Secondary | ICD-10-CM | POA: Diagnosis not present

## 2017-11-02 ENCOUNTER — Other Ambulatory Visit (INDEPENDENT_AMBULATORY_CARE_PROVIDER_SITE_OTHER): Payer: Self-pay | Admitting: Internal Medicine

## 2017-11-04 DIAGNOSIS — M79671 Pain in right foot: Secondary | ICD-10-CM | POA: Diagnosis not present

## 2017-11-04 DIAGNOSIS — M25579 Pain in unspecified ankle and joints of unspecified foot: Secondary | ICD-10-CM | POA: Diagnosis not present

## 2017-11-04 DIAGNOSIS — R51 Headache: Secondary | ICD-10-CM | POA: Diagnosis not present

## 2017-11-04 DIAGNOSIS — Z299 Encounter for prophylactic measures, unspecified: Secondary | ICD-10-CM | POA: Diagnosis not present

## 2017-11-04 DIAGNOSIS — E039 Hypothyroidism, unspecified: Secondary | ICD-10-CM | POA: Diagnosis not present

## 2017-11-04 DIAGNOSIS — M79672 Pain in left foot: Secondary | ICD-10-CM | POA: Diagnosis not present

## 2017-11-04 DIAGNOSIS — Z6826 Body mass index (BMI) 26.0-26.9, adult: Secondary | ICD-10-CM | POA: Diagnosis not present

## 2017-11-04 DIAGNOSIS — M797 Fibromyalgia: Secondary | ICD-10-CM | POA: Diagnosis not present

## 2017-11-04 DIAGNOSIS — I471 Supraventricular tachycardia: Secondary | ICD-10-CM | POA: Diagnosis not present

## 2017-11-11 DIAGNOSIS — E119 Type 2 diabetes mellitus without complications: Secondary | ICD-10-CM | POA: Diagnosis not present

## 2017-11-11 DIAGNOSIS — I10 Essential (primary) hypertension: Secondary | ICD-10-CM | POA: Diagnosis not present

## 2017-11-19 ENCOUNTER — Ambulatory Visit: Payer: Medicare Other | Admitting: Adult Health

## 2017-11-19 ENCOUNTER — Telehealth: Payer: Self-pay

## 2017-11-19 NOTE — Telephone Encounter (Signed)
Patient did not come for office visit with Heather Espinoza, on 11/19/2017. Phone call by husband on 11/19/2017 stated patient was not feeling well. MB RN.

## 2017-11-20 ENCOUNTER — Encounter: Payer: Self-pay | Admitting: Adult Health

## 2017-11-22 DIAGNOSIS — Z299 Encounter for prophylactic measures, unspecified: Secondary | ICD-10-CM | POA: Diagnosis not present

## 2017-11-22 DIAGNOSIS — Z6826 Body mass index (BMI) 26.0-26.9, adult: Secondary | ICD-10-CM | POA: Diagnosis not present

## 2017-11-22 DIAGNOSIS — Z713 Dietary counseling and surveillance: Secondary | ICD-10-CM | POA: Diagnosis not present

## 2017-11-22 DIAGNOSIS — I10 Essential (primary) hypertension: Secondary | ICD-10-CM | POA: Diagnosis not present

## 2017-11-22 DIAGNOSIS — J019 Acute sinusitis, unspecified: Secondary | ICD-10-CM | POA: Diagnosis not present

## 2017-12-04 ENCOUNTER — Other Ambulatory Visit (INDEPENDENT_AMBULATORY_CARE_PROVIDER_SITE_OTHER): Payer: Self-pay | Admitting: Internal Medicine

## 2017-12-06 DIAGNOSIS — E119 Type 2 diabetes mellitus without complications: Secondary | ICD-10-CM | POA: Diagnosis not present

## 2017-12-06 DIAGNOSIS — I10 Essential (primary) hypertension: Secondary | ICD-10-CM | POA: Diagnosis not present

## 2017-12-10 ENCOUNTER — Encounter (INDEPENDENT_AMBULATORY_CARE_PROVIDER_SITE_OTHER): Payer: Self-pay | Admitting: *Deleted

## 2017-12-23 DIAGNOSIS — E1165 Type 2 diabetes mellitus with hyperglycemia: Secondary | ICD-10-CM | POA: Diagnosis not present

## 2017-12-23 DIAGNOSIS — Z299 Encounter for prophylactic measures, unspecified: Secondary | ICD-10-CM | POA: Diagnosis not present

## 2017-12-23 DIAGNOSIS — Z23 Encounter for immunization: Secondary | ICD-10-CM | POA: Diagnosis not present

## 2017-12-23 DIAGNOSIS — Z6826 Body mass index (BMI) 26.0-26.9, adult: Secondary | ICD-10-CM | POA: Diagnosis not present

## 2017-12-23 DIAGNOSIS — I10 Essential (primary) hypertension: Secondary | ICD-10-CM | POA: Diagnosis not present

## 2017-12-30 ENCOUNTER — Other Ambulatory Visit (HOSPITAL_COMMUNITY): Payer: Self-pay | Admitting: Internal Medicine

## 2017-12-30 DIAGNOSIS — Z1231 Encounter for screening mammogram for malignant neoplasm of breast: Secondary | ICD-10-CM

## 2017-12-31 DIAGNOSIS — M25579 Pain in unspecified ankle and joints of unspecified foot: Secondary | ICD-10-CM | POA: Diagnosis not present

## 2017-12-31 DIAGNOSIS — M79672 Pain in left foot: Secondary | ICD-10-CM | POA: Diagnosis not present

## 2017-12-31 DIAGNOSIS — M79671 Pain in right foot: Secondary | ICD-10-CM | POA: Diagnosis not present

## 2018-01-01 ENCOUNTER — Other Ambulatory Visit (INDEPENDENT_AMBULATORY_CARE_PROVIDER_SITE_OTHER): Payer: Self-pay | Admitting: Internal Medicine

## 2018-01-03 DIAGNOSIS — Z299 Encounter for prophylactic measures, unspecified: Secondary | ICD-10-CM | POA: Diagnosis not present

## 2018-01-03 DIAGNOSIS — R5383 Other fatigue: Secondary | ICD-10-CM | POA: Diagnosis not present

## 2018-01-03 DIAGNOSIS — E039 Hypothyroidism, unspecified: Secondary | ICD-10-CM | POA: Diagnosis not present

## 2018-01-03 DIAGNOSIS — Z1211 Encounter for screening for malignant neoplasm of colon: Secondary | ICD-10-CM | POA: Diagnosis not present

## 2018-01-03 DIAGNOSIS — R251 Tremor, unspecified: Secondary | ICD-10-CM | POA: Diagnosis not present

## 2018-01-03 DIAGNOSIS — I10 Essential (primary) hypertension: Secondary | ICD-10-CM | POA: Diagnosis not present

## 2018-01-03 DIAGNOSIS — Z1331 Encounter for screening for depression: Secondary | ICD-10-CM | POA: Diagnosis not present

## 2018-01-03 DIAGNOSIS — E559 Vitamin D deficiency, unspecified: Secondary | ICD-10-CM | POA: Diagnosis not present

## 2018-01-03 DIAGNOSIS — Z7189 Other specified counseling: Secondary | ICD-10-CM | POA: Diagnosis not present

## 2018-01-03 DIAGNOSIS — E7800 Pure hypercholesterolemia, unspecified: Secondary | ICD-10-CM | POA: Diagnosis not present

## 2018-01-03 DIAGNOSIS — Z79899 Other long term (current) drug therapy: Secondary | ICD-10-CM | POA: Diagnosis not present

## 2018-01-03 DIAGNOSIS — Z0000 Encounter for general adult medical examination without abnormal findings: Secondary | ICD-10-CM | POA: Diagnosis not present

## 2018-01-03 DIAGNOSIS — Z1339 Encounter for screening examination for other mental health and behavioral disorders: Secondary | ICD-10-CM | POA: Diagnosis not present

## 2018-01-03 DIAGNOSIS — Z6827 Body mass index (BMI) 27.0-27.9, adult: Secondary | ICD-10-CM | POA: Diagnosis not present

## 2018-01-07 ENCOUNTER — Ambulatory Visit (INDEPENDENT_AMBULATORY_CARE_PROVIDER_SITE_OTHER): Payer: Medicare Other | Admitting: Neurology

## 2018-01-07 ENCOUNTER — Encounter: Payer: Self-pay | Admitting: Neurology

## 2018-01-07 ENCOUNTER — Other Ambulatory Visit: Payer: Self-pay

## 2018-01-07 VITALS — BP 117/69 | HR 73 | Resp 18 | Ht 61.5 in | Wt 138.5 lb

## 2018-01-07 DIAGNOSIS — G8929 Other chronic pain: Secondary | ICD-10-CM

## 2018-01-07 DIAGNOSIS — G250 Essential tremor: Secondary | ICD-10-CM

## 2018-01-07 DIAGNOSIS — M5442 Lumbago with sciatica, left side: Secondary | ICD-10-CM

## 2018-01-07 DIAGNOSIS — M5441 Lumbago with sciatica, right side: Secondary | ICD-10-CM | POA: Diagnosis not present

## 2018-01-07 HISTORY — DX: Essential tremor: G25.0

## 2018-01-07 MED ORDER — PROPRANOLOL HCL 20 MG PO TABS: 20.0000 mg | ORAL_TABLET | Freq: Two times a day (BID) | ORAL | 3 refills | Status: DC

## 2018-01-07 NOTE — Progress Notes (Signed)
Reason for visit: Low back pain, tremor  Heather Espinoza is an 72 y.o. female  History of present illness:  Heather Espinoza is a 72 year old right-handed white female with a history of chronic low back pain associated with facet joint arthritis.  X-rays have also shown some arthritis of both hips and SI joints.  The patient has gained benefit with facet joint injections in the past, the benefit will last 3 or 4 months.  The patient indicates that prolonged standing or sitting will worsen the back pain, she feels best when she is lying down.  The pain goes into the hips and down to the knees and the thighs.  The patient does not have pain below the knees.  She claims that just within the last several months she has developed tremors involving both upper extremities, her primary care physician told her that she had Parkinson's disease.  The patient has trouble with feeding herself and difficulty with handwriting, the tremors are symmetric from one side to the next.  The patient claims that her father also had a similar tremor.  All of her siblings have passed away.  Past Medical History:  Diagnosis Date  . Arthritis   . COPD (chronic obstructive pulmonary disease) ([place])   . Diabetes ([place])    type 2 x 18 months.  . Diverticulitis   . Diverticulosis   . Fibromyalgia   . High cholesterol   . Hypertension    4-5 yrs  . Hypothyroid   . IBS (irritable bowel syndrome)   . Kidney stone    Left Kidney  . Osteopenia   . Plantar fasciitis   . Restless leg   . Sleep apnea   . Tachycardia    per pt/fim  . Tendonitis    Right Wrist  . UTI (lower urinary tract infection)     Past Surgical History:  Procedure Laterality Date  . APPENDECTOMY    . BIOPSY  12/25/2016   Procedure: BIOPSY;  Surgeon: Rogene Houston, MD;  [facility];  Service: Endoscopy;;  gastric   . BREAST LUMPECTOMY Right 2001  . CATARACT EXTRACTION Bilateral 2016  . CHOLECYSTECTOMY    . COLONOSCOPY N/A 12/17/2012     Procedure: COLONOSCOPY;  Surgeon: Rogene Houston, MD;  [facility];  Service: Endoscopy;  Laterality: N/A;  215  . complete hysterectomy    . ESOPHAGEAL DILATION N/A 12/25/2016   Procedure: ESOPHAGEAL DILATION;  Surgeon: Rogene Houston, MD;  [facility];  Service: Endoscopy;  Laterality: N/A;  . ESOPHAGOGASTRODUODENOSCOPY N/A 12/25/2016   Procedure: ESOPHAGOGASTRODUODENOSCOPY (EGD);  Surgeon: Rogene Houston, MD;  [facility];  Service: Endoscopy;  Laterality: N/A;  730  . Heel tumor removed    . rt elbow surgery    . TONSILLECTOMY      Family History  Problem Relation Age of Onset  . Uterine cancer Mother   . Parkinson's disease Father   . Stroke Father   . Colon cancer Brother   . Lung cancer Brother   . COPD Sister   . Diabetes Grandchild   . Asthma Son     Social history:  reports that she has never smoked. She has never used smokeless tobacco. She reports that she does not drink alcohol or use drugs.    Allergies  Allergen Reactions  . Flagyl [Metronidazole] Shortness Of Breath and Swelling  . Adhesive [Tape] Other (See Comments)    Takes off patient's  skin  . Aspirin Other (See Comments)    Stomach bleed  . Lyrica [Pregabalin] Other (See Comments)    Breathing Problems  . Latex Itching and Rash  . Phenergan [Promethazine Hcl] Other (See Comments)    Caused grogginess, altered mental status  . Prednisone Anxiety  . Sulfa Antibiotics Nausea Only  . Tetanus Toxoids Swelling    Arm Area    Medications:  Prior to Admission medications   Medication Sig Start Date End Date Taking? Authorizing Provider  acetaminophen (TYLENOL) 500 MG tablet Take 500 mg by mouth every 6 (six) hours as needed for pain.   Yes [provider]  albuterol (PROVENTIL HFA;VENTOLIN HFA) 108 (90 Base) MCG/ACT inhaler Inhale 2 puffs into the lungs every 6 (six) hours as needed for wheezing or shortness of breath.   Yes [provider]   Apoaequorin (PREVAGEN PO) Take 1 tablet by mouth daily.   Yes [provider]  aspirin EC 81 MG tablet Take 1 tablet (81 mg total) by mouth daily. 12/28/16  Yes Rehman, Mechele Dawley, MD  Biotin 5000 MCG CAPS Take by mouth daily.    Yes [provider]  bisacodyl (DULCOLAX) 10 MG suppository Place 1 suppository (10 mg total) rectally as needed for moderate constipation. 01/04/15  Yes Rehman, Mechele Dawley, MD  calcipotriene (DOVONOX) 0.005 % cream Apply 2 g topically 2 (two) times daily.   Yes [provider]  cholecalciferol (VITAMIN D) 1000 units tablet Take 1,000 Units by mouth 2 (two) times daily.   Yes [provider]  dicyclomine (BENTYL) 10 MG capsule Take 1 capsule (10 mg total) by mouth 2 (two) times daily as needed for spasms. 12/04/16  Yes Rehman, Mechele Dawley, MD  diphenhydrAMINE (BENADRYL) 25 mg capsule Take 25 mg by mouth every 6 (six) hours as needed (for itchy rash.).   Yes [provider]  docusate sodium (COLACE) 100 MG capsule Take 2 capsules (200 mg total) by mouth at bedtime. Patient taking differently: Take 200 mg by mouth daily as needed (for constipation.).  01/04/15  Yes Rehman, Mechele Dawley, MD  DULoxetine (CYMBALTA) 60 MG capsule Take 1 capsule (60 mg total) by mouth 2 (two) times daily. 05/17/17  Yes Kathrynn Ducking, MD  esomeprazole (NEXIUM) 20 MG capsule Take 20 mg by mouth 2 (two) times daily before a meal.   Yes [provider]  Fluticasone-Salmeterol (ADVAIR) 250-50 MCG/DOSE AEPB Inhale 1 puff into the lungs every 12 (twelve) hours.   Yes [provider]  gabapentin (NEURONTIN) 800 MG tablet Take 800 mg by mouth daily.  10/06/15  Yes [provider]  levothyroxine (SYNTHROID, LEVOTHROID) 50 MCG tablet Take 50 mcg by mouth daily before breakfast.   Yes [provider]  lidocaine (LIDOCAINE PAK) 5 % ointment Apply 1 application topically 2 (two) times daily as needed.   Yes [provider]   lisinopril (PRINIVIL,ZESTRIL) 10 MG tablet Take 10 mg by mouth daily.   Yes [provider]  lubiprostone (AMITIZA) 8 MCG capsule Take 2 capsules (16 mcg total) by mouth daily with breakfast. Patient taking differently: Take 16 mcg by mouth daily as needed (for constipation.).  12/04/16  Yes Rehman, Mechele Dawley, MD  metformin (FORTAMET) 500 MG (OSM) 24 hr tablet Take 500 mg by mouth daily with breakfast.   Yes [provider]  ondansetron (ZOFRAN) 4 MG tablet TAKE 1 TABLET BY MOUTH TWICE DAILY AS NEEDED FOR NAUSEA AND VOMITING 01/01/18  Yes Setzer, Rona Ravens, NP  oxyCODONE-acetaminophen (PERCOCET/ROXICET) 5-325 MG tablet Take 1 tablet by mouth every 8 (eight) hours as needed (for hand pain.).   Yes [provider]  simvastatin (ZOCOR) 20 MG tablet Take 20 mg by mouth every evening.   Yes [provider]  traMADol (ULTRAM) 50 MG tablet Take 50 mg by mouth every 6 (six) hours as needed (for pain.).  10/19/16  Yes [provider]  traZODone (DESYREL) 100 MG tablet Take 100 mg by mouth at bedtime.  09/27/15  Yes [provider]  UNABLE TO FIND Apply 2 g topically every 12 (twelve) hours. Med Name: Flucinonide 0.1% cream   Yes [provider]  vitamin E 400 UNIT capsule Take 400 Units by mouth daily.   Yes [provider]    ROS:  Out of a complete 14 system review of symptoms, the patient complains only of the following symptoms, and all other reviewed systems are negative.  Tremor Decreased appetite, chills, fatigue Ringing in the ears, difficulty swallowing Eye itching, blurred vision Shortness of breath Cold intolerance, heat intolerance, excessive thirst Constipation, diarrhea, nausea Restless legs, daytime sleepiness, snoring, sleep talking, acting out dreams Incontinence of the bladder, frequency of urination Joint pain, back pain, aching muscles, muscle cramps, walking difficulty, neck pain, neck stiffness Bruising  easily Memory loss, dizziness, headache, weakness, passing out Decreased concentration  Blood pressure 117/69, pulse 73, resp. rate 18, height 5' 1.5" (1.562 m), weight 138 lb 8 oz (62.8 kg).  Physical Exam  General: The patient is alert and cooperative at the time of the examination.  Skin: No significant peripheral edema is noted.   Neurologic Exam  Mental status: The patient is alert and oriented x 3 at the time of the examination. The patient has apparent normal recent and remote memory, with an apparently normal attention span and concentration ability.   Cranial nerves: Facial symmetry is present. Speech is normal, no aphasia or dysarthria is noted. Extraocular movements are full. Visual fields are full.  Motor: The patient has good strength in all 4 extremities.  Sensory examination: Soft touch sensation is symmetric on the face, arms, and legs.  Coordination: The patient has good finger-nose-finger and heel-to-shin bilaterally.  The patient has action tremors that are present with finger-nose-finger bilaterally.  Some tremor is translated into the handwriting with drawing a spiral.  Gait and station: The patient has a normal gait. Tandem gait is unsteady.  Romberg is, but is unsteady. No drift is seen.  Reflexes: Deep tendon reflexes are symmetric.   XR left hip 05/17/17:  IMPRESSION: Degenerative changes lumbar spine, both SI joints, both hips. No acute bony abnormality identified. No evidence of fracture or dislocation.   Assessment/Plan:  1.  Chronic low back pain, facet joint arthritis  2.  Essential tremor  The patient does not appear to have Parkinson's disease, she has an essential tremor.  Her father had a similar tremor.  The patient will be placed on propranolol, she claims that she did have a recent blackout that was associated with tachycardia, the propranolol may help prevent these episodes as well.  The patient will go on 20 mg twice daily, she will  call for any dose adjustments.  The patient will be sent for another facet joint injection which seemed to be somewhat beneficial on previous occasions.  The patient will follow-up in 6 months.  Jill Alexanders MD 01/07/2018 12:55 PM  [facility] [address] [place], [place] [address]  Phone [phone] Fax  [phone]  

## 2018-01-07 NOTE — Patient Instructions (Signed)
We will start propranolol for the tremor.  Inderal (propranolol) is a blood pressure medication that is commonly used for migraine headaches. This is a type of beta blocker. The most common side effects include low heart rate, dizziness, fatigue, and increased depression. This medication may worsen asthma. If you believe that you are having side effects on this medication, please contact our office.

## 2018-01-08 ENCOUNTER — Other Ambulatory Visit: Payer: Self-pay | Admitting: Neurology

## 2018-01-08 DIAGNOSIS — M5441 Lumbago with sciatica, right side: Principal | ICD-10-CM

## 2018-01-08 DIAGNOSIS — M5442 Lumbago with sciatica, left side: Principal | ICD-10-CM

## 2018-01-08 DIAGNOSIS — G8929 Other chronic pain: Secondary | ICD-10-CM

## 2018-01-15 DIAGNOSIS — I10 Essential (primary) hypertension: Secondary | ICD-10-CM | POA: Diagnosis not present

## 2018-01-15 DIAGNOSIS — E119 Type 2 diabetes mellitus without complications: Secondary | ICD-10-CM | POA: Diagnosis not present

## 2018-01-20 ENCOUNTER — Inpatient Hospital Stay (HOSPITAL_COMMUNITY): Admission: RE | Admit: 2018-01-20 | Payer: Medicare Other | Source: Ambulatory Visit

## 2018-01-22 ENCOUNTER — Ambulatory Visit
Admission: RE | Admit: 2018-01-22 | Discharge: 2018-01-22 | Disposition: A | Discharge status: Home / Self Care | Payer: Medicare Other | Source: Ambulatory Visit | Attending: Neurology | Admitting: Neurology

## 2018-01-22 DIAGNOSIS — M5442 Lumbago with sciatica, left side: Principal | ICD-10-CM

## 2018-01-22 DIAGNOSIS — G8929 Other chronic pain: Secondary | ICD-10-CM

## 2018-01-22 DIAGNOSIS — M5441 Lumbago with sciatica, right side: Principal | ICD-10-CM

## 2018-01-22 DIAGNOSIS — M545 Low back pain: Secondary | ICD-10-CM | POA: Diagnosis not present

## 2018-01-22 MED ORDER — METHYLPREDNISOLONE ACETATE 40 MG/ML INJ SUSP (RADIOLOG
120.0000 mg | Freq: Once | INTRAMUSCULAR | Status: AC
  Administered 2018-01-22: 120 mg via INTRA_ARTICULAR

## 2018-01-22 MED ORDER — IOPAMIDOL (ISOVUE-M 200) INJECTION 41%
1.0000 mL | Freq: Once | INTRAMUSCULAR | Status: AC
  Administered 2018-01-22: 1 mL via INTRA_ARTICULAR

## 2018-02-02 ENCOUNTER — Other Ambulatory Visit (INDEPENDENT_AMBULATORY_CARE_PROVIDER_SITE_OTHER): Payer: Self-pay | Admitting: Internal Medicine

## 2018-02-03 ENCOUNTER — Ambulatory Visit (HOSPITAL_COMMUNITY): Payer: Medicare Other

## 2018-02-06 DIAGNOSIS — E119 Type 2 diabetes mellitus without complications: Secondary | ICD-10-CM | POA: Diagnosis not present

## 2018-02-06 DIAGNOSIS — I10 Essential (primary) hypertension: Secondary | ICD-10-CM | POA: Diagnosis not present

## 2018-02-24 ENCOUNTER — Ambulatory Visit (HOSPITAL_COMMUNITY): Payer: Medicare Other

## 2018-03-03 ENCOUNTER — Ambulatory Visit (HOSPITAL_COMMUNITY): Payer: Medicare Other

## 2018-03-07 DIAGNOSIS — I10 Essential (primary) hypertension: Secondary | ICD-10-CM | POA: Diagnosis not present

## 2018-03-07 DIAGNOSIS — E119 Type 2 diabetes mellitus without complications: Secondary | ICD-10-CM | POA: Diagnosis not present

## 2018-03-17 ENCOUNTER — Ambulatory Visit (HOSPITAL_COMMUNITY): Payer: Medicare Other

## 2018-03-30 DIAGNOSIS — N179 Acute kidney failure, unspecified: Secondary | ICD-10-CM | POA: Diagnosis not present

## 2018-03-30 DIAGNOSIS — I10 Essential (primary) hypertension: Secondary | ICD-10-CM | POA: Diagnosis not present

## 2018-03-30 DIAGNOSIS — E119 Type 2 diabetes mellitus without complications: Secondary | ICD-10-CM | POA: Diagnosis not present

## 2018-03-30 DIAGNOSIS — J189 Pneumonia, unspecified organism: Secondary | ICD-10-CM | POA: Diagnosis not present

## 2018-03-31 DIAGNOSIS — J189 Pneumonia, unspecified organism: Secondary | ICD-10-CM | POA: Diagnosis not present

## 2018-03-31 DIAGNOSIS — E119 Type 2 diabetes mellitus without complications: Secondary | ICD-10-CM | POA: Diagnosis not present

## 2018-03-31 DIAGNOSIS — I10 Essential (primary) hypertension: Secondary | ICD-10-CM | POA: Diagnosis not present

## 2018-03-31 DIAGNOSIS — N179 Acute kidney failure, unspecified: Secondary | ICD-10-CM | POA: Diagnosis not present

## 2018-04-01 DIAGNOSIS — E119 Type 2 diabetes mellitus without complications: Secondary | ICD-10-CM | POA: Diagnosis not present

## 2018-04-01 DIAGNOSIS — J189 Pneumonia, unspecified organism: Secondary | ICD-10-CM | POA: Diagnosis not present

## 2018-04-01 DIAGNOSIS — I10 Essential (primary) hypertension: Secondary | ICD-10-CM | POA: Diagnosis not present

## 2018-04-01 DIAGNOSIS — N179 Acute kidney failure, unspecified: Secondary | ICD-10-CM | POA: Diagnosis not present

## 2018-04-02 ENCOUNTER — Inpatient Hospital Stay (HOSPITAL_COMMUNITY): Admission: RE | Admit: 2018-04-02 | Payer: Medicare HMO | Source: Ambulatory Visit

## 2018-04-02 DIAGNOSIS — I10 Essential (primary) hypertension: Secondary | ICD-10-CM | POA: Diagnosis not present

## 2018-04-02 DIAGNOSIS — J189 Pneumonia, unspecified organism: Secondary | ICD-10-CM | POA: Diagnosis not present

## 2018-04-02 DIAGNOSIS — E119 Type 2 diabetes mellitus without complications: Secondary | ICD-10-CM | POA: Diagnosis not present

## 2018-04-02 DIAGNOSIS — N179 Acute kidney failure, unspecified: Secondary | ICD-10-CM | POA: Diagnosis not present

## 2018-04-03 DIAGNOSIS — I10 Essential (primary) hypertension: Secondary | ICD-10-CM | POA: Diagnosis not present

## 2018-04-03 DIAGNOSIS — J189 Pneumonia, unspecified organism: Secondary | ICD-10-CM | POA: Diagnosis not present

## 2018-04-03 DIAGNOSIS — E119 Type 2 diabetes mellitus without complications: Secondary | ICD-10-CM | POA: Diagnosis not present

## 2018-04-03 DIAGNOSIS — N179 Acute kidney failure, unspecified: Secondary | ICD-10-CM | POA: Diagnosis not present

## 2018-04-04 DIAGNOSIS — E119 Type 2 diabetes mellitus without complications: Secondary | ICD-10-CM | POA: Diagnosis not present

## 2018-04-04 DIAGNOSIS — I10 Essential (primary) hypertension: Secondary | ICD-10-CM | POA: Diagnosis not present

## 2018-04-04 DIAGNOSIS — J189 Pneumonia, unspecified organism: Secondary | ICD-10-CM | POA: Diagnosis not present

## 2018-04-04 DIAGNOSIS — N179 Acute kidney failure, unspecified: Secondary | ICD-10-CM | POA: Diagnosis not present

## 2018-04-08 ENCOUNTER — Emergency Department (HOSPITAL_COMMUNITY): Payer: Medicare HMO

## 2018-04-08 ENCOUNTER — Inpatient Hospital Stay (HOSPITAL_COMMUNITY)
Admission: EM | Admit: 2018-04-08 | Discharge: 2018-04-12 | DRG: 194 | Disposition: A | Discharge status: Home Health | Payer: Medicare HMO | Attending: Family Medicine | Admitting: Family Medicine

## 2018-04-08 ENCOUNTER — Encounter (HOSPITAL_COMMUNITY): Payer: Self-pay | Admitting: Emergency Medicine

## 2018-04-08 DIAGNOSIS — E119 Type 2 diabetes mellitus without complications: Secondary | ICD-10-CM | POA: Diagnosis present

## 2018-04-08 DIAGNOSIS — Z7982 Long term (current) use of aspirin: Secondary | ICD-10-CM

## 2018-04-08 DIAGNOSIS — E039 Hypothyroidism, unspecified: Secondary | ICD-10-CM | POA: Diagnosis present

## 2018-04-08 DIAGNOSIS — Z7951 Long term (current) use of inhaled steroids: Secondary | ICD-10-CM

## 2018-04-08 DIAGNOSIS — G2581 Restless legs syndrome: Secondary | ICD-10-CM | POA: Diagnosis present

## 2018-04-08 DIAGNOSIS — I10 Essential (primary) hypertension: Secondary | ICD-10-CM | POA: Diagnosis present

## 2018-04-08 DIAGNOSIS — E7800 Pure hypercholesterolemia, unspecified: Secondary | ICD-10-CM | POA: Diagnosis present

## 2018-04-08 DIAGNOSIS — E8801 Alpha-1-antitrypsin deficiency: Secondary | ICD-10-CM | POA: Diagnosis present

## 2018-04-08 DIAGNOSIS — J189 Pneumonia, unspecified organism: Secondary | ICD-10-CM

## 2018-04-08 DIAGNOSIS — J181 Lobar pneumonia, unspecified organism: Secondary | ICD-10-CM

## 2018-04-08 DIAGNOSIS — K219 Gastro-esophageal reflux disease without esophagitis: Secondary | ICD-10-CM | POA: Diagnosis present

## 2018-04-08 DIAGNOSIS — Z7952 Long term (current) use of systemic steroids: Secondary | ICD-10-CM

## 2018-04-08 DIAGNOSIS — Z833 Family history of diabetes mellitus: Secondary | ICD-10-CM

## 2018-04-08 DIAGNOSIS — M797 Fibromyalgia: Secondary | ICD-10-CM | POA: Diagnosis present

## 2018-04-08 DIAGNOSIS — J9601 Acute respiratory failure with hypoxia: Secondary | ICD-10-CM | POA: Diagnosis present

## 2018-04-08 DIAGNOSIS — Z7984 Long term (current) use of oral hypoglycemic drugs: Secondary | ICD-10-CM

## 2018-04-08 DIAGNOSIS — Z79890 Hormone replacement therapy (postmenopausal): Secondary | ICD-10-CM

## 2018-04-08 DIAGNOSIS — J440 Chronic obstructive pulmonary disease with acute lower respiratory infection: Secondary | ICD-10-CM | POA: Diagnosis present

## 2018-04-08 DIAGNOSIS — Z79899 Other long term (current) drug therapy: Secondary | ICD-10-CM

## 2018-04-08 DIAGNOSIS — J13 Pneumonia due to Streptococcus pneumoniae: Secondary | ICD-10-CM

## 2018-04-08 DIAGNOSIS — K589 Irritable bowel syndrome without diarrhea: Secondary | ICD-10-CM | POA: Diagnosis present

## 2018-04-08 DIAGNOSIS — R9431 Abnormal electrocardiogram [ECG] [EKG]: Secondary | ICD-10-CM

## 2018-04-08 DIAGNOSIS — Y95 Nosocomial condition: Secondary | ICD-10-CM | POA: Diagnosis present

## 2018-04-08 DIAGNOSIS — R0902 Hypoxemia: Secondary | ICD-10-CM | POA: Diagnosis present

## 2018-04-08 DIAGNOSIS — E876 Hypokalemia: Secondary | ICD-10-CM | POA: Diagnosis present

## 2018-04-08 DIAGNOSIS — E785 Hyperlipidemia, unspecified: Secondary | ICD-10-CM | POA: Diagnosis present

## 2018-04-08 LAB — CBC
HCT: 31.8 % — ABNORMAL LOW (ref 36.0–46.0)
Hemoglobin: 10.4 g/dL — ABNORMAL LOW (ref 12.0–15.0)
MCH: 30.1 pg (ref 26.0–34.0)
MCHC: 32.7 g/dL (ref 30.0–36.0)
MCV: 92.2 fL (ref 80.0–100.0)
Platelets: 442 10*3/uL — ABNORMAL HIGH (ref 150–400)
RBC: 3.45 MIL/uL — ABNORMAL LOW (ref 3.87–5.11)
RDW: 13.9 % (ref 11.5–15.5)
WBC: 10.3 10*3/uL (ref 4.0–10.5)
nRBC: 0 % (ref 0.0–0.2)

## 2018-04-08 LAB — BASIC METABOLIC PANEL
Anion gap: 10 (ref 5–15)
BUN: 6 mg/dL — ABNORMAL LOW (ref 8–23)
CO2: 25 mmol/L (ref 22–32)
Calcium: 8.4 mg/dL — ABNORMAL LOW (ref 8.9–10.3)
Chloride: 106 mmol/L (ref 98–111)
Creatinine, Ser: 1.2 mg/dL — ABNORMAL HIGH (ref 0.44–1.00)
GFR calc Af Amer: 53 mL/min — ABNORMAL LOW (ref >60)
GFR calc non Af Amer: 46 mL/min — ABNORMAL LOW (ref >60)
Glucose, Bld: 130 mg/dL — ABNORMAL HIGH (ref 70–99)
Potassium: 2.5 mmol/L — CL (ref 3.5–5.1)
Sodium: 141 mmol/L (ref 135–145)

## 2018-04-08 LAB — I-STAT TROPONIN, ED: Troponin i, poc: 0 ng/mL (ref 0.00–0.08)

## 2018-04-08 MED ORDER — SODIUM CHLORIDE 0.9% FLUSH: 3.0000 mL | Freq: Once | INTRAVENOUS | Status: DC

## 2018-04-08 MED ORDER — POTASSIUM CHLORIDE CRYS ER 20 MEQ PO TBCR
40.0000 meq | EXTENDED_RELEASE_TABLET | Freq: Once | ORAL | Status: AC
  Administered 2018-04-09: 40 meq via ORAL
  Filled 2018-04-08: qty 2

## 2018-04-08 MED ORDER — IPRATROPIUM-ALBUTEROL 0.5-2.5 (3) MG/3ML IN SOLN
3.0000 mL | Freq: Once | RESPIRATORY_TRACT | Status: DC
  Filled 2018-04-08: qty 3

## 2018-04-08 MED ORDER — SODIUM CHLORIDE 0.9 % IV BOLUS
1000.0000 mL | Freq: Once | INTRAVENOUS | Status: AC
  Administered 2018-04-09: 1000 mL via INTRAVENOUS

## 2018-04-08 MED ORDER — POTASSIUM CHLORIDE 10 MEQ/100ML IV SOLN
10.0000 meq | Freq: Once | INTRAVENOUS | Status: AC
  Administered 2018-04-09: 10 meq via INTRAVENOUS
  Filled 2018-04-08: qty 100

## 2018-04-08 NOTE — ED Notes (Signed)
Please note: Pt is very upset about her ED experience, stating she waited 5 hours (it was 4) and called this tech "a stupid little idiot".

## 2018-04-08 NOTE — ED Triage Notes (Signed)
Pt was discharged from hospital last week, was diagnosed w/ pneumonia, has not gotten any better.

## 2018-04-08 NOTE — ED Provider Notes (Addendum)
[facility] EMERGENCY DEPARTMENT Provider Note  CSN: [id number] Arrival date & time: 04/08/18 1807  Chief Complaint(s) Cough and Weakness  HPI Heather Espinoza is a 73 y.o. female with extensive past medical history listed below including antitrypsin alpha-1 COPD not on oxygen and diabetes who was recently admitted to [facility] for severe sepsis due to pneumonia on January 12.  She was discharged from the ICU on the 17th.  She reports that she has had persistent fatigue since being diagnosed with pneumonia.  States that she completed a course of antibiotics while hospitalized.  Patient is trying to hydrate but has had decreased oral intake.  She endorses one episode of emesis earlier today.  She is also endorsing mid back and bilateral lateral chest wall pain which she attributes to coughing.  Chest pain is aching and sore in nature.  Exacerbated with coughing and palpation of the chest wall in these regions.  Nonexertional.  Nonradiating.  She is endorsing shortness of breath which improves with her scheduled albuterol treatments.  She denies any abdominal pain or diarrhea.  Denies any urinary symptoms.  HPI   Past Medical History Past Medical History:  Diagnosis Date  . Arthritis   . COPD (chronic obstructive pulmonary disease) ([place])   . Diabetes ([place])    type 2 x 18 months.  . Diverticulitis   . Diverticulosis   . Fibromyalgia   . High cholesterol   . Hypertension    4-5 yrs  . Hypothyroid   . IBS (irritable bowel syndrome)   . Kidney stone    Left Kidney  . Osteopenia   . Plantar fasciitis   . Restless leg   . Sleep apnea   . Tachycardia    per pt/fim  . Tendonitis    Right Wrist  . Tremor, essential 01/07/2018  . UTI (lower urinary tract infection)    Patient Active Problem List   Diagnosis Date Noted  . Tremor, essential 01/07/2018  . Other dysphagia 12/21/2016  . Chronic low back pain 10/07/2015  . IBS (irritable bowel syndrome) 02/17/2013    . GERD (gastroesophageal reflux disease) 02/17/2013  . Depression 02/17/2013  . Family hx of colon cancer 11/24/2012  . Essential hypertension, benign 11/24/2012  . High cholesterol 11/24/2012  . Diabetes ([place]) 11/24/2012  . Fibromyalgia 11/24/2012  . Unspecified hypothyroidism 11/24/2012   Home Medication(s) Prior to Admission medications   Medication Sig Start Date End Date Taking? Authorizing Provider  acetaminophen (TYLENOL) 500 MG tablet Take 500 mg by mouth every 6 (six) hours as needed for pain.   Yes [provider]  albuterol (PROVENTIL HFA;VENTOLIN HFA) 108 (90 Base) MCG/ACT inhaler Inhale 2 puffs into the lungs every 6 (six) hours as needed for wheezing or shortness of breath.   Yes [provider]  amLODipine (NORVASC) 5 MG tablet Take 5 mg by mouth daily. 04/04/18  Yes [provider]  aspirin EC 81 MG tablet Take 1 tablet (81 mg total) by mouth daily. 12/28/16  Yes Rehman, Mechele Dawley, MD  Biotin 5000 MCG CAPS Take 1 capsule by mouth daily.    Yes [provider]  bisacodyl (DULCOLAX) 10 MG suppository Place 1 suppository (10 mg total) rectally as needed for moderate constipation. 01/04/15  Yes Rehman, Mechele Dawley, MD  buPROPion (WELLBUTRIN XL) 150 MG 24 hr tablet Take 1 tablet by mouth daily. 04/08/18  Yes [provider]  cholecalciferol (VITAMIN D) 1000 units tablet Take 1,000 Units by mouth 2 (  two) times daily.   Yes [provider]  dicyclomine (BENTYL) 10 MG capsule Take 1 capsule (10 mg total) by mouth 2 (two) times daily as needed for spasms. 12/04/16  Yes Rehman, Mechele Dawley, MD  diphenhydrAMINE (BENADRYL) 25 mg capsule Take 25 mg by mouth every 6 (six) hours as needed (for itchy rash.).   Yes [provider]  docusate sodium (COLACE) 100 MG capsule Take 2 capsules (200 mg total) by mouth at bedtime. Patient taking differently: Take 200 mg by mouth daily as needed (for constipation.).  01/04/15  Yes Rehman, Mechele Dawley,  MD  DULoxetine (CYMBALTA) 60 MG capsule Take 1 capsule (60 mg total) by mouth 2 (two) times daily. 05/17/17  Yes Kathrynn Ducking, MD  esomeprazole (NEXIUM) 20 MG capsule Take 20 mg by mouth 2 (two) times daily before a meal.   Yes [provider]  Fluticasone-Salmeterol (ADVAIR) 250-50 MCG/DOSE AEPB Inhale 1 puff into the lungs every 12 (twelve) hours.   Yes [provider]  gabapentin (NEURONTIN) 800 MG tablet Take 400 mg by mouth 2 (two) times daily.  10/06/15  Yes [provider]  hydrocortisone cream 0.5 % Apply 1 application topically 2 (two) times daily as needed for itching.   Yes [provider]  levothyroxine (SYNTHROID, LEVOTHROID) 50 MCG tablet Take 37.5 mcg by mouth daily before breakfast.    Yes [provider]  lidocaine (LIDOCAINE PAK) 5 % ointment Apply 1 application topically 2 (two) times daily as needed for mild pain.    Yes [provider]  lisinopril (PRINIVIL,ZESTRIL) 10 MG tablet Take 20 mg by mouth daily.    Yes [provider]  lubiprostone (AMITIZA) 8 MCG capsule Take 2 capsules (16 mcg total) by mouth daily with breakfast. Patient taking differently: Take 16 mcg by mouth daily as needed (for constipation.).  12/04/16  Yes Rehman, Mechele Dawley, MD  metformin (FORTAMET) 500 MG (OSM) 24 hr tablet Take 500 mg by mouth daily with breakfast.   Yes [provider]  metoprolol succinate (TOPROL-XL) 25 MG 24 hr tablet Take 12.5 mg by mouth at bedtime. 04/08/18  Yes [provider]  ondansetron (ZOFRAN) 4 MG tablet TAKE 1 TABLET BY MOUTH TWICE DAILY AS NEEDED FOR NAUSEA AND VOMITING Patient taking differently: Take 4 mg by mouth every 8 (eight) hours as needed for nausea or vomiting.  01/01/18  Yes Setzer, Rona Ravens, NP  propranolol (INDERAL) 20 MG tablet Take 1 tablet (20 mg total) by mouth 2 (two) times daily. 01/07/18  Yes Kathrynn Ducking, MD  simvastatin (ZOCOR) 20 MG tablet Take 20 mg by mouth every  evening.   Yes [provider]  traMADol (ULTRAM) 50 MG tablet Take 50 mg by mouth every 6 (six) hours as needed (for pain.).  10/19/16  Yes [provider]  traZODone (DESYREL) 100 MG tablet Take 100 mg by mouth at bedtime.  09/27/15  Yes [provider]  vitamin E 400 UNIT capsule Take 400 Units by mouth daily.   Yes [provider]  Past Surgical History Past Surgical History:  Procedure Laterality Date  . APPENDECTOMY    . BIOPSY  12/25/2016   Procedure: BIOPSY;  Surgeon: Rogene Houston, MD;  [facility];  Service: Endoscopy;;  gastric   . BREAST LUMPECTOMY Right 2001  . CATARACT EXTRACTION Bilateral 2016  . CHOLECYSTECTOMY    . COLONOSCOPY N/A 12/17/2012   Procedure: COLONOSCOPY;  Surgeon: Rogene Houston, MD;  [facility];  Service: Endoscopy;  Laterality: N/A;  215  . complete hysterectomy    . ESOPHAGEAL DILATION N/A 12/25/2016   Procedure: ESOPHAGEAL DILATION;  Surgeon: Rogene Houston, MD;  [facility];  Service: Endoscopy;  Laterality: N/A;  . ESOPHAGOGASTRODUODENOSCOPY N/A 12/25/2016   Procedure: ESOPHAGOGASTRODUODENOSCOPY (EGD);  Surgeon: Rogene Houston, MD;  [facility];  Service: Endoscopy;  Laterality: N/A;  730  . Heel tumor removed    . rt elbow surgery    . TONSILLECTOMY     Family History Family History  Problem Relation Age of Onset  . Uterine cancer Mother   . Parkinson's disease Father   . Stroke Father   . Colon cancer Brother   . Lung cancer Brother   . COPD Sister   . Diabetes Grandchild   . Asthma Son     Social History Social History   Tobacco Use  . Smoking status: Never Smoker  . Smokeless tobacco: Never Used  Substance Use Topics  . Alcohol use: No    Alcohol/week: 0.0 standard drinks  . Drug use: No   Allergies Flagyl  [metronidazole]; Adhesive [tape]; Aspirin; Lyrica [pregabalin]; Latex; Phenergan [promethazine hcl]; Prednisone; Sulfa antibiotics; and Tetanus toxoids  Review of Systems Review of Systems All other systems are reviewed and are negative for acute change except as noted in the HPI  Physical Exam Vital Signs  I have reviewed the triage vital signs BP (!) 142/76 (BP Location: Right Arm)   Pulse (!) 102   Temp 98.1 F (36.7 C) (Oral)   Resp 20   SpO2 98%   Physical Exam Vitals signs reviewed.  Constitutional:      General: She is not in acute distress.    Appearance: She is well-developed. She is not diaphoretic.  HENT:     Head: Normocephalic and atraumatic.     Nose: Nose normal.  Eyes:     General: No scleral icterus.       Right eye: No discharge.        Left eye: No discharge.     Conjunctiva/sclera: Conjunctivae normal.     Pupils: Pupils are equal, round, and reactive to light.  Neck:     Musculoskeletal: Normal range of motion and neck supple.  Cardiovascular:     Rate and Rhythm: Normal rate and regular rhythm.     Heart sounds: No murmur. No friction rub. No gallop.   Pulmonary:     Effort: Pulmonary effort is normal. Tachypnea present. No respiratory distress.     Breath sounds: Normal breath sounds. Decreased air movement present. No stridor. No rales.  Abdominal:     General: There is no distension.     Palpations: Abdomen is soft.     Tenderness: There is no abdominal tenderness.  Musculoskeletal:        General: No tenderness.  Skin:    General: Skin is warm and dry.     Findings: No erythema or rash.  Neurological:     Mental Status: She is  alert and oriented to person, place, and time.     ED Results and Treatments Labs (all labs ordered are listed, but only abnormal results are displayed) Labs Reviewed  BASIC METABOLIC PANEL - Abnormal; Notable for the following components:      Result Value   Potassium 2.5 (*)    Glucose, Bld 130 (*)    BUN 6  (*)    Creatinine, Ser 1.20 (*)    Calcium 8.4 (*)    GFR calc non Af Amer 46 (*)    GFR calc Af Amer 53 (*)    All other components within normal limits  CBC - Abnormal; Notable for the following components:   RBC 3.45 (*)    Hemoglobin 10.4 (*)    HCT 31.8 (*)    Platelets 442 (*)    All other components within normal limits  I-STAT TROPONIN, ED  I-STAT TROPONIN, ED                                                                                                                         EKG  EKG Interpretation  Date/Time:  Tuesday April 08 2018 19:02:44 EST Ventricular Rate:  106 PR Interval:  140 QRS Duration: 68 QT Interval:  392 QTC Calculation: 520 R Axis:   69 Text Interpretation:  Sinus tachycardia T wave abnormality, consider anterior ischemia Prolonged QT Abnormal ECG NO STEMI Confirmed by Addison Lank [phone]) on 04/08/2018 11:45:53 PM      Radiology Dg Chest 2 View  Result Date: 04/08/2018 CLINICAL DATA:  Chest pain EXAM: CHEST - 2 VIEW COMPARISON:  03/30/2017, 08/03/2017 FINDINGS: Persistent left lower lobe opacity. No pleural effusion. Normal heart size. No pneumothorax. IMPRESSION: Persistent left lower lobe opacity, may reflect ongoing pneumonia. Slightly convex margins on lateral view raises possibility of mass lesion. Chest CT could be obtained to further evaluate. Electronically Signed   By: Donavan Foil M.D.   On: 04/08/2018 19:55   Pertinent labs & imaging results that were available during my care of the patient were reviewed by me and considered in my medical decision making (see chart for details).  Medications Ordered in ED Medications  sodium chloride flush (NS) 0.9 % injection 3 mL (has no administration in time range)  sodium chloride 0.9 % bolus 1,000 mL (has no administration in time range)  ipratropium-albuterol (DUONEB) 0.5-2.5 (3) MG/3ML nebulizer solution 3 mL (0 mLs Nebulization Hold 04/09/18 0006)  potassium chloride 10 mEq in 100 mL IVPB  (has no administration in time range)  potassium chloride SA (K-DUR,KLOR-CON) CR tablet 40 mEq (has no administration in time range)  Procedures Procedures CRITICAL CARE Performed by: Grayce Sessions Zakira Ressel Total critical care time: 40 minutes Critical care time was exclusive of separately billable procedures and treating other patients. Critical care was necessary to treat or prevent imminent or life-threatening deterioration. Critical care was time spent personally by me on the following activities: development of treatment plan with patient and/or surrogate as well as nursing, discussions with consultants, evaluation of patient's response to treatment, examination of patient, obtaining history from patient or surrogate, ordering and performing treatments and interventions, ordering and review of laboratory studies, ordering and review of radiographic studies, pulse oximetry and re-evaluation of patient's condition.   (including critical care time)  Medical Decision Making / ED Course I have reviewed the nursing notes for this encounter and the patient's prior records (if available in EHR or on provided paperwork).    CT of the chest obtained given chest x-ray evidence of persistent pneumonia.  CT revealed bibasilar pneumonia.  Labs are grossly reassuring with no leukocytosis and improved renal function when compared to previous labs from January 10.  However patient does have hypokalemia.  Due to her COPD and decreased air movement,  She was given DuoNeb.  Air movement improved.  Patient was ambulated afterwards and had difficulty breathing, and became tachycardic.  Patient was given IV Levaquin.  Patient was admitted to hospital service for continued management.  Final Clinical Impression(s) / ED Diagnoses Final diagnoses:  Pneumonia of both lower lobes due  to infectious organism [facility])      This chart was dictated using voice recognition software.  Despite best efforts to proofread,  errors can occur which can change the documentation meaning.   Fatima Blank, MD 04/09/18 4917    Fatima Blank, MD 04/21/18 [phone]

## 2018-04-09 ENCOUNTER — Other Ambulatory Visit (HOSPITAL_COMMUNITY): Payer: Self-pay

## 2018-04-09 ENCOUNTER — Emergency Department (HOSPITAL_COMMUNITY): Payer: Medicare HMO

## 2018-04-09 ENCOUNTER — Encounter (HOSPITAL_COMMUNITY): Payer: Self-pay | Admitting: Internal Medicine

## 2018-04-09 ENCOUNTER — Other Ambulatory Visit: Payer: Self-pay

## 2018-04-09 DIAGNOSIS — R0902 Hypoxemia: Secondary | ICD-10-CM | POA: Diagnosis present

## 2018-04-09 DIAGNOSIS — Y95 Nosocomial condition: Secondary | ICD-10-CM | POA: Diagnosis present

## 2018-04-09 DIAGNOSIS — K589 Irritable bowel syndrome without diarrhea: Secondary | ICD-10-CM | POA: Diagnosis not present

## 2018-04-09 DIAGNOSIS — R9431 Abnormal electrocardiogram [ECG] [EKG]: Secondary | ICD-10-CM | POA: Diagnosis not present

## 2018-04-09 DIAGNOSIS — E039 Hypothyroidism, unspecified: Secondary | ICD-10-CM | POA: Diagnosis not present

## 2018-04-09 DIAGNOSIS — J440 Chronic obstructive pulmonary disease with acute lower respiratory infection: Secondary | ICD-10-CM | POA: Diagnosis not present

## 2018-04-09 DIAGNOSIS — I10 Essential (primary) hypertension: Secondary | ICD-10-CM | POA: Diagnosis not present

## 2018-04-09 DIAGNOSIS — Z7982 Long term (current) use of aspirin: Secondary | ICD-10-CM | POA: Diagnosis not present

## 2018-04-09 DIAGNOSIS — Z79899 Other long term (current) drug therapy: Secondary | ICD-10-CM | POA: Diagnosis not present

## 2018-04-09 DIAGNOSIS — Z833 Family history of diabetes mellitus: Secondary | ICD-10-CM | POA: Diagnosis not present

## 2018-04-09 DIAGNOSIS — E876 Hypokalemia: Secondary | ICD-10-CM | POA: Diagnosis present

## 2018-04-09 DIAGNOSIS — J13 Pneumonia due to Streptococcus pneumoniae: Secondary | ICD-10-CM | POA: Diagnosis not present

## 2018-04-09 DIAGNOSIS — Z79890 Hormone replacement therapy (postmenopausal): Secondary | ICD-10-CM | POA: Diagnosis not present

## 2018-04-09 DIAGNOSIS — J189 Pneumonia, unspecified organism: Secondary | ICD-10-CM | POA: Diagnosis not present

## 2018-04-09 DIAGNOSIS — E8801 Alpha-1-antitrypsin deficiency: Secondary | ICD-10-CM | POA: Diagnosis present

## 2018-04-09 DIAGNOSIS — J9601 Acute respiratory failure with hypoxia: Secondary | ICD-10-CM | POA: Diagnosis present

## 2018-04-09 DIAGNOSIS — Z7952 Long term (current) use of systemic steroids: Secondary | ICD-10-CM | POA: Diagnosis not present

## 2018-04-09 DIAGNOSIS — E119 Type 2 diabetes mellitus without complications: Secondary | ICD-10-CM | POA: Diagnosis not present

## 2018-04-09 DIAGNOSIS — G2581 Restless legs syndrome: Secondary | ICD-10-CM | POA: Diagnosis not present

## 2018-04-09 DIAGNOSIS — M797 Fibromyalgia: Secondary | ICD-10-CM | POA: Diagnosis not present

## 2018-04-09 DIAGNOSIS — E7800 Pure hypercholesterolemia, unspecified: Secondary | ICD-10-CM | POA: Diagnosis not present

## 2018-04-09 DIAGNOSIS — J181 Lobar pneumonia, unspecified organism: Secondary | ICD-10-CM | POA: Diagnosis present

## 2018-04-09 DIAGNOSIS — K219 Gastro-esophageal reflux disease without esophagitis: Secondary | ICD-10-CM | POA: Diagnosis present

## 2018-04-09 DIAGNOSIS — E785 Hyperlipidemia, unspecified: Secondary | ICD-10-CM | POA: Diagnosis present

## 2018-04-09 DIAGNOSIS — Z7951 Long term (current) use of inhaled steroids: Secondary | ICD-10-CM | POA: Diagnosis not present

## 2018-04-09 DIAGNOSIS — Z7984 Long term (current) use of oral hypoglycemic drugs: Secondary | ICD-10-CM | POA: Diagnosis not present

## 2018-04-09 LAB — MAGNESIUM: Magnesium: 1.4 mg/dL — ABNORMAL LOW (ref 1.7–2.4)

## 2018-04-09 LAB — STREP PNEUMONIAE URINARY ANTIGEN: Strep Pneumo Urinary Antigen: POSITIVE — AB

## 2018-04-09 LAB — GLUCOSE, CAPILLARY: Glucose-Capillary: 139 mg/dL — ABNORMAL HIGH (ref 70–99)

## 2018-04-09 LAB — MRSA PCR SCREENING: MRSA by PCR: NEGATIVE

## 2018-04-09 LAB — I-STAT TROPONIN, ED: Troponin i, poc: 0 ng/mL (ref 0.00–0.08)

## 2018-04-09 MED ORDER — HYDROCOD POLST-CPM POLST ER 10-8 MG/5ML PO SUER
3.0000 mL | Freq: Once | ORAL | Status: AC
  Administered 2018-04-09: 3 mL via ORAL
  Filled 2018-04-09: qty 5

## 2018-04-09 MED ORDER — BISACODYL 10 MG RE SUPP: 10.0000 mg | Freq: Every day | RECTAL | Status: DC | PRN

## 2018-04-09 MED ORDER — DULOXETINE HCL 60 MG PO CPEP
60.0000 mg | ORAL_CAPSULE | Freq: Two times a day (BID) | ORAL | Status: DC
  Administered 2018-04-09 – 2018-04-12 (×6): 60 mg via ORAL
  Filled 2018-04-09 (×6): qty 1

## 2018-04-09 MED ORDER — GABAPENTIN 400 MG PO CAPS
400.0000 mg | ORAL_CAPSULE | Freq: Two times a day (BID) | ORAL | Status: DC
  Administered 2018-04-09 – 2018-04-12 (×6): 400 mg via ORAL
  Filled 2018-04-09 (×6): qty 1

## 2018-04-09 MED ORDER — ENOXAPARIN SODIUM 40 MG/0.4ML ~~LOC~~ SOLN
40.0000 mg | SUBCUTANEOUS | Status: DC
  Administered 2018-04-09 – 2018-04-11 (×3): 40 mg via SUBCUTANEOUS
  Filled 2018-04-09 (×3): qty 0.4

## 2018-04-09 MED ORDER — VITAMIN E 180 MG (400 UNIT) PO CAPS
400.0000 [IU] | ORAL_CAPSULE | Freq: Every day | ORAL | Status: DC
  Administered 2018-04-10 – 2018-04-12 (×3): 400 [IU] via ORAL
  Filled 2018-04-09 (×4): qty 1

## 2018-04-09 MED ORDER — FLUTICASONE FUROATE-VILANTEROL 200-25 MCG/INH IN AEPB
1.0000 | INHALATION_SPRAY | Freq: Every day | RESPIRATORY_TRACT | Status: DC
  Administered 2018-04-11 – 2018-04-12 (×2): 1 via RESPIRATORY_TRACT
  Filled 2018-04-09 (×2): qty 28

## 2018-04-09 MED ORDER — DOCUSATE SODIUM 100 MG PO CAPS: 200.0000 mg | ORAL_CAPSULE | Freq: Every day | ORAL | Status: DC | PRN

## 2018-04-09 MED ORDER — METOPROLOL SUCCINATE ER 25 MG PO TB24
12.5000 mg | ORAL_TABLET | Freq: Every day | ORAL | Status: DC
  Administered 2018-04-09 – 2018-04-11 (×3): 12.5 mg via ORAL
  Filled 2018-04-09 (×3): qty 1

## 2018-04-09 MED ORDER — LUBIPROSTONE 8 MCG PO CAPS
16.0000 ug | ORAL_CAPSULE | Freq: Every day | ORAL | Status: DC | PRN
  Filled 2018-04-09: qty 2

## 2018-04-09 MED ORDER — LEVOFLOXACIN IN D5W 500 MG/100ML IV SOLN
500.0000 mg | Freq: Once | INTRAVENOUS | Status: AC
  Administered 2018-04-09: 500 mg via INTRAVENOUS
  Filled 2018-04-09: qty 100

## 2018-04-09 MED ORDER — ACETAMINOPHEN 500 MG PO TABS: 500.0000 mg | ORAL_TABLET | Freq: Four times a day (QID) | ORAL | Status: DC | PRN

## 2018-04-09 MED ORDER — PANTOPRAZOLE SODIUM 40 MG PO TBEC
40.0000 mg | DELAYED_RELEASE_TABLET | Freq: Every day | ORAL | Status: DC
  Administered 2018-04-09 – 2018-04-12 (×4): 40 mg via ORAL
  Filled 2018-04-09: qty 1
  Filled 2018-04-09: qty 2
  Filled 2018-04-09 (×2): qty 1

## 2018-04-09 MED ORDER — TRAMADOL HCL 50 MG PO TABS: 50.0000 mg | ORAL_TABLET | Freq: Four times a day (QID) | ORAL | Status: DC | PRN

## 2018-04-09 MED ORDER — VANCOMYCIN HCL IN DEXTROSE 750-5 MG/150ML-% IV SOLN
750.0000 mg | INTRAVENOUS | Status: DC
  Administered 2018-04-10: 750 mg via INTRAVENOUS
  Filled 2018-04-09: qty 150

## 2018-04-09 MED ORDER — BIOTIN 5000 MCG PO CAPS: 1.0000 | ORAL_CAPSULE | Freq: Every day | ORAL | Status: DC

## 2018-04-09 MED ORDER — SODIUM CHLORIDE 0.9 % IV SOLN
2.0000 g | INTRAVENOUS | Status: DC
  Administered 2018-04-09: 2 g via INTRAVENOUS
  Filled 2018-04-09: qty 2

## 2018-04-09 MED ORDER — VANCOMYCIN HCL IN DEXTROSE 1-5 GM/200ML-% IV SOLN
1000.0000 mg | Freq: Once | INTRAVENOUS | Status: AC
  Administered 2018-04-09: 1000 mg via INTRAVENOUS
  Filled 2018-04-09: qty 200

## 2018-04-09 MED ORDER — TRAZODONE HCL 100 MG PO TABS
100.0000 mg | ORAL_TABLET | Freq: Every day | ORAL | Status: DC
  Administered 2018-04-09 – 2018-04-11 (×3): 100 mg via ORAL
  Filled 2018-04-09 (×3): qty 1

## 2018-04-09 MED ORDER — HYDROCOD POLST-CPM POLST ER 10-8 MG/5ML PO SUER
5.0000 mL | Freq: Once | ORAL | Status: AC
  Administered 2018-04-09: 5 mL via ORAL
  Filled 2018-04-09: qty 5

## 2018-04-09 MED ORDER — INSULIN ASPART 100 UNIT/ML ~~LOC~~ SOLN: 0.0000 [IU] | Freq: Every day | SUBCUTANEOUS | Status: DC

## 2018-04-09 MED ORDER — VITAMIN D 25 MCG (1000 UNIT) PO TABS
1000.0000 [IU] | ORAL_TABLET | Freq: Two times a day (BID) | ORAL | Status: DC
  Administered 2018-04-09 – 2018-04-12 (×6): 1000 [IU] via ORAL
  Filled 2018-04-09 (×6): qty 1

## 2018-04-09 MED ORDER — DICYCLOMINE HCL 10 MG PO CAPS
10.0000 mg | ORAL_CAPSULE | Freq: Two times a day (BID) | ORAL | Status: DC | PRN
  Filled 2018-04-09: qty 1

## 2018-04-09 MED ORDER — LEVOTHYROXINE SODIUM 75 MCG PO TABS
37.5000 ug | ORAL_TABLET | Freq: Every day | ORAL | Status: DC
  Administered 2018-04-10 – 2018-04-12 (×3): 37.5 ug via ORAL
  Filled 2018-04-09 (×3): qty 1

## 2018-04-09 MED ORDER — ALBUTEROL SULFATE (2.5 MG/3ML) 0.083% IN NEBU: 2.5000 mg | INHALATION_SOLUTION | RESPIRATORY_TRACT | Status: DC | PRN

## 2018-04-09 MED ORDER — BUPROPION HCL ER (XL) 150 MG PO TB24
150.0000 mg | ORAL_TABLET | Freq: Every day | ORAL | Status: DC
  Administered 2018-04-09 – 2018-04-12 (×4): 150 mg via ORAL
  Filled 2018-04-09 (×4): qty 1

## 2018-04-09 MED ORDER — ONDANSETRON HCL 4 MG PO TABS: 4.0000 mg | ORAL_TABLET | Freq: Four times a day (QID) | ORAL | Status: DC | PRN

## 2018-04-09 MED ORDER — SIMVASTATIN 20 MG PO TABS
20.0000 mg | ORAL_TABLET | Freq: Every evening | ORAL | Status: DC
  Administered 2018-04-09 – 2018-04-11 (×3): 20 mg via ORAL
  Filled 2018-04-09 (×3): qty 1

## 2018-04-09 MED ORDER — INSULIN ASPART 100 UNIT/ML ~~LOC~~ SOLN
0.0000 [IU] | Freq: Three times a day (TID) | SUBCUTANEOUS | Status: DC
  Administered 2018-04-09: 1 [IU] via SUBCUTANEOUS

## 2018-04-09 MED ORDER — ASPIRIN EC 81 MG PO TBEC
81.0000 mg | DELAYED_RELEASE_TABLET | Freq: Every day | ORAL | Status: DC
  Administered 2018-04-09 – 2018-04-12 (×4): 81 mg via ORAL
  Filled 2018-04-09 (×4): qty 1

## 2018-04-09 MED ORDER — POTASSIUM CHLORIDE CRYS ER 20 MEQ PO TBCR
40.0000 meq | EXTENDED_RELEASE_TABLET | Freq: Two times a day (BID) | ORAL | Status: AC
  Administered 2018-04-09 – 2018-04-10 (×4): 40 meq via ORAL
  Filled 2018-04-09 (×4): qty 2

## 2018-04-09 MED ORDER — AMLODIPINE BESYLATE 5 MG PO TABS
5.0000 mg | ORAL_TABLET | Freq: Every day | ORAL | Status: DC
  Administered 2018-04-09 – 2018-04-12 (×4): 5 mg via ORAL
  Filled 2018-04-09 (×4): qty 1

## 2018-04-09 MED ORDER — PROPRANOLOL HCL 40 MG PO TABS
20.0000 mg | ORAL_TABLET | Freq: Two times a day (BID) | ORAL | Status: DC
  Administered 2018-04-09 – 2018-04-12 (×6): 20 mg via ORAL
  Filled 2018-04-09 (×6): qty 1

## 2018-04-09 MED ORDER — ONDANSETRON HCL 4 MG/2ML IJ SOLN: 4.0000 mg | Freq: Four times a day (QID) | INTRAMUSCULAR | Status: DC | PRN

## 2018-04-09 MED ORDER — GABAPENTIN 800 MG PO TABS
400.0000 mg | ORAL_TABLET | Freq: Two times a day (BID) | ORAL | Status: DC
  Filled 2018-04-09: qty 0.5

## 2018-04-09 MED ORDER — ONDANSETRON HCL 4 MG/2ML IJ SOLN
4.0000 mg | Freq: Once | INTRAMUSCULAR | Status: AC
  Administered 2018-04-09: 4 mg via INTRAVENOUS
  Filled 2018-04-09: qty 2

## 2018-04-09 NOTE — Progress Notes (Signed)
Pharmacy Antibiotic Note  Heather Espinoza is a 73 y.o. female admitted on 04/08/2018 with pneumonia. Pharmacy has been consulted for vancomycin dosing. Pt is afebrile and WBC is WNL. SCr is 1.2.   Plan: Vancomycin 1gm IV x 1 then 750mg  IV Q24H F/u renal fxn, C&S, clinical status and peak/trough at [facility] F/u continuation of levaquin or other gram negative coverage  Height: 5' 1.5" (156.2 cm) Weight: 138 lb 7.2 oz (62.8 kg) IBW/kg (Calculated) : 48.95  Temp (24hrs), Avg:98.1 F (36.7 C), Min:98.1 F (36.7 C), Max:98.1 F (36.7 C)  Recent Labs  Lab 04/08/18 1919  WBC 10.3  CREATININE 1.20*    Estimated Creatinine Clearance: 37.5 mL/min (A) (by C-G formula based on SCr of 1.2 mg/dL (H)).    Allergies  Allergen Reactions  . Flagyl [Metronidazole] Shortness Of Breath and Swelling  . Adhesive [Tape] Other (See Comments)    Takes off patient's skin  . Aspirin Other (See Comments)    Stomach bleed  . Lyrica [Pregabalin] Other (See Comments)    Breathing Problems  . Latex Itching and Rash  . Phenergan [Promethazine Hcl] Other (See Comments)    Caused grogginess, altered mental status  . Prednisone Anxiety  . Sulfa Antibiotics Nausea Only  . Tetanus Toxoids Swelling    Arm Area    Antimicrobials this admission: vanc 1/22>> Levaquin x 1 1/22  Dose adjustments this admission: N/A  Microbiology results: Pending  Thank you for allowing pharmacy to be a part of this patient's care.  Sharnice Bosler, Rande Lawman 04/09/2018 8:44 AM

## 2018-04-09 NOTE — Progress Notes (Signed)

## 2018-04-09 NOTE — Progress Notes (Signed)
PHARMACY NOTE:  ANTIMICROBIAL RENAL DOSAGE ADJUSTMENT  Current antimicrobial regimen includes a mismatch between antimicrobial dosage and estimated renal function.  As per policy approved by the Pharmacy & Therapeutics and Medical Executive Committees, the antimicrobial dosage will be adjusted accordingly.  Current antimicrobial dosage:  Cefepime 1g IV q8  Indication: PNA  Renal Function:  Estimated Creatinine Clearance: 37.5 mL/min (A) (by C-G formula based on SCr of 1.2 mg/dL (H)). []      On intermittent HD, scheduled: []      On CRRT    Antimicrobial dosage has been changed to:  Cefepime 2g IV q24  Additional comments:   Onnie Boer, PharmD, BCIDP, AAHIVP, CPP Infectious Disease Pharmacist 04/09/2018 5:23 PM

## 2018-04-09 NOTE — ED Notes (Signed)
Pt placed on pur wick

## 2018-04-09 NOTE — H&P (Signed)
History and Physical    Heather Espinoza YWV:[id number] DOB: [birth year] DOA: 04/08/2018  PCP: Monico Blitz, MD  Patient coming from: Home.  I have personally briefly reviewed patient's old medical records in [place] and Care everywhere.   Chief Complaint: Wheezing and shortness of breath and extreme weakness.  HPI: Heather Espinoza is a 73 y.o. female with medical history significant of COPD with alpha-1 antitrypsin deficiency, type 2 diabetes on metformin, hypertension, hyperlipidemia, hypothyroidism who was recently admitted to another hospital for pneumonia and sepsis and was discharged about 5 days ago comes back to the emergency room with persistent shortness of breath, wheezing and weakness.  According to the patient, she was admitted on January 12th at [facility] with severe sepsis due to pneumonia and was released on 17th.  Patient stated that her fever improved, however she has persistent weakness, she feels short of breath on walking, she has poor appetite and occasional nausea.  She also has some cough with minimal yellowish sputum.  Cough associated with chest wall pain.  She had some diarrhea last week while in the hospital, however now denies any diarrhea.  She is not on oxygen.  Because of persistent symptoms she came to our ER.  Patient denies any fever at home. ED Course: Patient is hemodynamically stable.  Blood pressures are stable.  She is afebrile.  WBC count is 10.3.  Her potassium is 2.5 and significantly low.  A CT scan of the chest shows multifocal pneumonia with her chronic COPD changes.  Patient is on room air with occasional desaturation on ambulation in the ER.  Patient was given a dose of Levaquin in the ER and admission was requested.  Review of Systems: As per HPI otherwise 10 point review of systems negative.    Past Medical History:  Diagnosis Date  . Arthritis   . COPD (chronic obstructive pulmonary disease) ([place])   . Diabetes ([place])    type 2 x 18 months.   . Diverticulitis   . Diverticulosis   . Fibromyalgia   . High cholesterol   . Hypertension    4-5 yrs  . Hypothyroid   . IBS (irritable bowel syndrome)   . Kidney stone    Left Kidney  . Osteopenia   . Plantar fasciitis   . Restless leg   . Sleep apnea   . Tachycardia    per pt/fim  . Tendonitis    Right Wrist  . Tremor, essential 01/07/2018  . UTI (lower urinary tract infection)     Past Surgical History:  Procedure Laterality Date  . APPENDECTOMY    . BIOPSY  12/25/2016   Procedure: BIOPSY;  Surgeon: Rogene Houston, MD;  [facility];  Service: Endoscopy;;  gastric   . BREAST LUMPECTOMY Right 2001  . CATARACT EXTRACTION Bilateral 2016  . CHOLECYSTECTOMY    . COLONOSCOPY N/A 12/17/2012   Procedure: COLONOSCOPY;  Surgeon: Rogene Houston, MD;  [facility];  Service: Endoscopy;  Laterality: N/A;  215  . complete hysterectomy    . ESOPHAGEAL DILATION N/A 12/25/2016   Procedure: ESOPHAGEAL DILATION;  Surgeon: Rogene Houston, MD;  [facility];  Service: Endoscopy;  Laterality: N/A;  . ESOPHAGOGASTRODUODENOSCOPY N/A 12/25/2016   Procedure: ESOPHAGOGASTRODUODENOSCOPY (EGD);  Surgeon: Rogene Houston, MD;  [facility];  Service: Endoscopy;  Laterality: N/A;  730  . Heel tumor removed    . rt elbow surgery    . TONSILLECTOMY  reports that she has never smoked. She has never used smokeless tobacco. She reports that she does not drink alcohol or use drugs.  Allergies  Allergen Reactions  . Flagyl [Metronidazole] Shortness Of Breath and Swelling  . Adhesive [Tape] Other (See Comments)    Takes off patient's skin  . Aspirin Other (See Comments)    Stomach bleed  . Lyrica [Pregabalin] Other (See Comments)    Breathing Problems  . Latex Itching and Rash  . Phenergan [Promethazine Hcl] Other (See Comments)    Caused grogginess, altered mental status  . Prednisone Anxiety  . Sulfa Antibiotics Nausea Only  . Tetanus  Toxoids Swelling    Arm Area    Family History  Problem Relation Age of Onset  . Uterine cancer Mother   . Parkinson's disease Father   . Stroke Father   . Colon cancer Brother   . Lung cancer Brother   . COPD Sister   . Diabetes Grandchild   . Asthma Son      Prior to Admission medications   Medication Sig Start Date End Date Taking? Authorizing Provider  acetaminophen (TYLENOL) 500 MG tablet Take 500 mg by mouth every 6 (six) hours as needed for pain.   Yes [provider]  albuterol (PROVENTIL HFA;VENTOLIN HFA) 108 (90 Base) MCG/ACT inhaler Inhale 2 puffs into the lungs every 6 (six) hours as needed for wheezing or shortness of breath.   Yes [provider]  amLODipine (NORVASC) 5 MG tablet Take 5 mg by mouth daily. 04/04/18  Yes [provider]  aspirin EC 81 MG tablet Take 1 tablet (81 mg total) by mouth daily. 12/28/16  Yes Rehman, Mechele Dawley, MD  Biotin 5000 MCG CAPS Take 1 capsule by mouth daily.    Yes [provider]  bisacodyl (DULCOLAX) 10 MG suppository Place 1 suppository (10 mg total) rectally as needed for moderate constipation. 01/04/15  Yes Rehman, Mechele Dawley, MD  buPROPion (WELLBUTRIN XL) 150 MG 24 hr tablet Take 1 tablet by mouth daily. 04/08/18  Yes [provider]  cholecalciferol (VITAMIN D) 1000 units tablet Take 1,000 Units by mouth 2 (two) times daily.   Yes [provider]  dicyclomine (BENTYL) 10 MG capsule Take 1 capsule (10 mg total) by mouth 2 (two) times daily as needed for spasms. 12/04/16  Yes Rehman, Mechele Dawley, MD  diphenhydrAMINE (BENADRYL) 25 mg capsule Take 25 mg by mouth every 6 (six) hours as needed (for itchy rash.).   Yes [provider]  docusate sodium (COLACE) 100 MG capsule Take 2 capsules (200 mg total) by mouth at bedtime. Patient taking differently: Take 200 mg by mouth daily as needed (for constipation.).  01/04/15  Yes Rehman, Mechele Dawley, MD  DULoxetine (CYMBALTA) 60 MG capsule Take  1 capsule (60 mg total) by mouth 2 (two) times daily. 05/17/17  Yes Kathrynn Ducking, MD  esomeprazole (NEXIUM) 20 MG capsule Take 20 mg by mouth 2 (two) times daily before a meal.   Yes [provider]  Fluticasone-Salmeterol (ADVAIR) 250-50 MCG/DOSE AEPB Inhale 1 puff into the lungs every 12 (twelve) hours.   Yes [provider]  gabapentin (NEURONTIN) 800 MG tablet Take 400 mg by mouth 2 (two) times daily.  10/06/15  Yes [provider]  hydrocortisone cream 0.5 % Apply 1 application topically 2 (two) times daily as needed for itching.   Yes [provider]  levothyroxine (SYNTHROID, LEVOTHROID) 50 MCG tablet Take 37.5 mcg by mouth daily before  breakfast.    Yes [provider]  lidocaine (LIDOCAINE PAK) 5 % ointment Apply 1 application topically 2 (two) times daily as needed for mild pain.    Yes [provider]  lisinopril (PRINIVIL,ZESTRIL) 10 MG tablet Take 20 mg by mouth daily.    Yes [provider]  lubiprostone (AMITIZA) 8 MCG capsule Take 2 capsules (16 mcg total) by mouth daily with breakfast. Patient taking differently: Take 16 mcg by mouth daily as needed (for constipation.).  12/04/16  Yes Rehman, Mechele Dawley, MD  metformin (FORTAMET) 500 MG (OSM) 24 hr tablet Take 500 mg by mouth daily with breakfast.   Yes [provider]  metoprolol succinate (TOPROL-XL) 25 MG 24 hr tablet Take 12.5 mg by mouth at bedtime. 04/08/18  Yes [provider]  ondansetron (ZOFRAN) 4 MG tablet TAKE 1 TABLET BY MOUTH TWICE DAILY AS NEEDED FOR NAUSEA AND VOMITING Patient taking differently: Take 4 mg by mouth every 8 (eight) hours as needed for nausea or vomiting.  01/01/18  Yes Setzer, Rona Ravens, NP  propranolol (INDERAL) 20 MG tablet Take 1 tablet (20 mg total) by mouth 2 (two) times daily. 01/07/18  Yes Kathrynn Ducking, MD  simvastatin (ZOCOR) 20 MG tablet Take 20 mg by mouth every evening.   Yes [provider]  traMADol  (ULTRAM) 50 MG tablet Take 50 mg by mouth every 6 (six) hours as needed (for pain.).  10/19/16  Yes [provider]  traZODone (DESYREL) 100 MG tablet Take 100 mg by mouth at bedtime.  09/27/15  Yes [provider]  vitamin E 400 UNIT capsule Take 400 Units by mouth daily.   Yes [provider]    Physical Exam: Vitals:   04/09/18 0230 04/09/18 0245 04/09/18 0300 04/09/18 0720  BP: (!) 169/109 139/78 (!) 142/73 (!) 152/70  Pulse:      Resp: 19 (!) 26 20   Temp:      TempSrc:      SpO2:   (!) 87% 98%    Constitutional: NAD, calm, comfortable Vitals:   04/09/18 0230 04/09/18 0245 04/09/18 0300 04/09/18 0720  BP: (!) 169/109 139/78 (!) 142/73 (!) 152/70  Pulse:      Resp: 19 (!) 26 20   Temp:      TempSrc:      SpO2:   (!) 87% 98%   Eyes: PERRL, lids and conjunctivae normal ENMT: Mucous membranes are moist. Posterior pharynx clear of any exudate or lesions.Normal dentition.  Neck: normal, supple, no masses, no thyromegaly Respiratory: Patient is currently on room air.  She does have expiratory crackles on bilateral posterior base.  Normal respiratory effort. No accessory muscle use.  Cardiovascular: Regular rate and rhythm, no murmurs / rubs / gallops. No extremity edema. 2+ pedal pulses. No carotid bruits.  Abdomen: no tenderness, no masses palpated. No hepatosplenomegaly. Bowel sounds positive.  Musculoskeletal: no clubbing / cyanosis. No joint deformity upper and lower extremities. Good ROM, no contractures. Normal muscle tone.  Skin: no rashes, lesions, ulcers. No induration Neurologic: CN 2-12 grossly intact. Sensation intact, DTR normal. Strength 5/5 in all 4.  Psychiatric: Normal judgment and insight. Alert and oriented x 3. Normal mood.     Labs on Admission: I have personally reviewed following labs and imaging studies  CBC: Recent Labs  Lab 04/08/18 1919  WBC 10.3  HGB 10.4*  HCT 31.8*  MCV 92.2  PLT 914*   Basic Metabolic  Panel: Recent Labs  Lab 04/08/18  1919  NA 141  K 2.5*  CL 106  CO2 25  GLUCOSE 130*  BUN 6*  CREATININE 1.20*  CALCIUM 8.4*   GFR: CrCl cannot be calculated (Unknown ideal weight.). Liver Function Tests: No results for input(s): AST, ALT, ALKPHOS, BILITOT, PROT, ALBUMIN in the last 168 hours. No results for input(s): LIPASE, AMYLASE in the last 168 hours. No results for input(s): AMMONIA in the last 168 hours. Coagulation Profile: No results for input(s): INR, PROTIME in the last 168 hours. Cardiac Enzymes: No results for input(s): CKTOTAL, CKMB, CKMBINDEX, TROPONINI in the last 168 hours. BNP (last 3 results) No results for input(s): PROBNP in the last 8760 hours. HbA1C: No results for input(s): HGBA1C in the last 72 hours. CBG: No results for input(s): GLUCAP in the last 168 hours. Lipid Profile: No results for input(s): CHOL, HDL, LDLCALC, TRIG, CHOLHDL, LDLDIRECT in the last 72 hours. Thyroid Function Tests: No results for input(s): TSH, T4TOTAL, FREET4, T3FREE, THYROIDAB in the last 72 hours. Anemia Panel: No results for input(s): VITAMINB12, FOLATE, FERRITIN, TIBC, IRON, RETICCTPCT in the last 72 hours. Urine analysis: No results found for: COLORURINE, APPEARANCEUR, [place], [place], [place], [place], [place], [place], [place], [place], [place], LEUKOCYTESUR  Radiological Exams on Admission: Dg Chest 2 View  Result Date: 04/08/2018 CLINICAL DATA:  Chest pain EXAM: CHEST - 2 VIEW COMPARISON:  03/30/2017, 08/03/2017 FINDINGS: Persistent left lower lobe opacity. No pleural effusion. Normal heart size. No pneumothorax. IMPRESSION: Persistent left lower lobe opacity, may reflect ongoing pneumonia. Slightly convex margins on lateral view raises possibility of mass lesion. Chest CT could be obtained to further evaluate. Electronically Signed   By: Donavan Foil M.D.   On: 04/08/2018 19:55   Ct Chest Wo Contrast  Result Date: 04/09/2018 CLINICAL DATA:   73 year old female with recent diagnosis of pneumonia. EXAM: CT CHEST WITHOUT CONTRAST TECHNIQUE: Multidetector CT imaging of the chest was performed following the standard protocol without IV contrast. COMPARISON:  Chest radiograph dated 04/08/2018 FINDINGS: Evaluation of this exam is limited in the absence of intravenous contrast. Cardiovascular: There is no cardiomegaly. Minimal pericardial effusion. There is mild atherosclerotic calcification of the thoracic aorta. The central pulmonary arteries are grossly unremarkable on this noncontrast CT. Mediastinum/Nodes: No hilar or mediastinal adenopathy. Esophagus is grossly unremarkable. No mediastinal fluid collection. Lungs/Pleura: There are bibasilar subsegmental consolidative changes, left greater right which may represent pneumonia. Scattered confluent hazy densities in the upper lobes as well as areas of nodularity in the right middle lobe and lingula most consistent with pneumonia. There are small bilateral pleural effusions. No pneumothorax. The central airways are patent. Upper Abdomen: No acute abnormality. Musculoskeletal: Degenerative changes. No acute osseous pathology. IMPRESSION: 1. Bibasilar subsegmental consolidative changes and scattered bilateral confluent hazy and nodular densities most consistent with pneumonia. Clinical correlation and follow-up to resolution recommended. 2. Small bilateral pleural effusions. Electronically Signed   By: Anner Crete M.D.   On: 04/09/2018 00:15    EKG: Independently reviewed.  Tachycardia.  Normal sinus rhythm.  No ST-T wave changes.  Assessment/Plan Principal Problem:   Recurrent pneumonia Active Problems:   Essential hypertension, benign   Diabetes (HCC)   IBS (irritable bowel syndrome)   GERD (gastroesophageal reflux disease)   Hypokalemia   Pneumonia   Recurrent pneumonia with hypoxia: With recent admission in the ICU for sepsis due to pneumonia.  Currently hemodynamically stable.  Given  persistent symptoms and CT scan evidence of persistent pneumonia, agree with admission. We will treat as healthcare associated pneumonia with  broad-spectrum antibiotics.  Consult pharmacy for vancomycin and cefepime.  Will check vancomycin levels and adjust medications accordingly.  Check a sputum cultures.  Will check a streptococcal antigen.  Aggressive bronchodilator therapy, incentive spirometry and deep breathing exercises. Speech therapy evaluation for any evidence of aspiration. PT and OT evaluation for ambulation.  Hypokalemia:Severe.  Replaced with IV and oral in the ER.  Will keep on a scheduled replacement.  Recheck levels in the morning.  Will check magnesium levels.  Hypertension: Fairly stable.  She will resume her home medications.  Type 2 diabetes: On oral hypoglycemics at home.  She will hold off on metformin and will keep on sliding scale insulin while in the hospital.  GERD: On PPI that she will continue.   DVT prophylaxis: Lovenox. Code Status: Full code. Family Communication: No family at bedside. Disposition Plan: Home. Consults called: None. Admission status: Inpatient.   Barb Merino MD [facility] Pager [phone]  If 7PM-7AM, please contact night-coverage [email or web address] Password [facility]  04/09/2018, 8:41 AM

## 2018-04-09 NOTE — ED Notes (Signed)
Potassium was burning pt's arm, so it has been slowed down for comfort.

## 2018-04-10 DIAGNOSIS — J13 Pneumonia due to Streptococcus pneumoniae: Principal | ICD-10-CM

## 2018-04-10 DIAGNOSIS — E119 Type 2 diabetes mellitus without complications: Secondary | ICD-10-CM

## 2018-04-10 DIAGNOSIS — J449 Chronic obstructive pulmonary disease, unspecified: Secondary | ICD-10-CM

## 2018-04-10 DIAGNOSIS — R9431 Abnormal electrocardiogram [ECG] [EKG]: Secondary | ICD-10-CM

## 2018-04-10 DIAGNOSIS — E039 Hypothyroidism, unspecified: Secondary | ICD-10-CM

## 2018-04-10 DIAGNOSIS — J181 Lobar pneumonia, unspecified organism: Secondary | ICD-10-CM

## 2018-04-10 LAB — COMPREHENSIVE METABOLIC PANEL
ALT: 13 U/L (ref 0–44)
AST: 22 U/L (ref 15–41)
Albumin: 2.6 g/dL — ABNORMAL LOW (ref 3.5–5.0)
Alkaline Phosphatase: 56 U/L (ref 38–126)
Anion gap: 6 (ref 5–15)
BUN: <5 mg/dL — ABNORMAL LOW (ref 8–23)
CO2: 27 mmol/L (ref 22–32)
Calcium: 7.8 mg/dL — ABNORMAL LOW (ref 8.9–10.3)
Chloride: 110 mmol/L (ref 98–111)
Creatinine, Ser: 1.03 mg/dL — ABNORMAL HIGH (ref 0.44–1.00)
GFR calc Af Amer: >60 mL/min (ref >60)
GFR calc non Af Amer: 55 mL/min — ABNORMAL LOW (ref >60)
Glucose, Bld: 112 mg/dL — ABNORMAL HIGH (ref 70–99)
Potassium: 4 mmol/L (ref 3.5–5.1)
Sodium: 143 mmol/L (ref 135–145)
Total Bilirubin: 0.7 mg/dL (ref 0.3–1.2)
Total Protein: 5.2 g/dL — ABNORMAL LOW (ref 6.5–8.1)

## 2018-04-10 LAB — EXPECTORATED SPUTUM ASSESSMENT W GRAM STAIN, RFLX TO RESP C

## 2018-04-10 LAB — CBC
HCT: 28.6 % — ABNORMAL LOW (ref 36.0–46.0)
Hemoglobin: 9.2 g/dL — ABNORMAL LOW (ref 12.0–15.0)
MCH: 31 pg (ref 26.0–34.0)
MCHC: 32.2 g/dL (ref 30.0–36.0)
MCV: 96.3 fL (ref 80.0–100.0)
Platelets: 343 10*3/uL (ref 150–400)
RBC: 2.97 MIL/uL — ABNORMAL LOW (ref 3.87–5.11)
RDW: 14.5 % (ref 11.5–15.5)
WBC: 5.8 10*3/uL (ref 4.0–10.5)
nRBC: 0 % (ref 0.0–0.2)

## 2018-04-10 LAB — HIV ANTIBODY (ROUTINE TESTING W REFLEX): HIV Screen 4th Generation wRfx: NONREACTIVE

## 2018-04-10 LAB — GLUCOSE, CAPILLARY
Glucose-Capillary: 100 mg/dL — ABNORMAL HIGH (ref 70–99)
Glucose-Capillary: 103 mg/dL — ABNORMAL HIGH (ref 70–99)
Glucose-Capillary: 105 mg/dL — ABNORMAL HIGH (ref 70–99)
Glucose-Capillary: 96 mg/dL (ref 70–99)

## 2018-04-10 MED ORDER — MAGNESIUM OXIDE 400 (241.3 MG) MG PO TABS
400.0000 mg | ORAL_TABLET | Freq: Two times a day (BID) | ORAL | Status: DC
  Administered 2018-04-10 – 2018-04-12 (×5): 400 mg via ORAL
  Filled 2018-04-10 (×5): qty 1

## 2018-04-10 MED ORDER — SODIUM CHLORIDE 0.9 % IV SOLN
1.0000 g | INTRAVENOUS | Status: DC
  Administered 2018-04-10 – 2018-04-12 (×3): 1 g via INTRAVENOUS
  Filled 2018-04-10 (×3): qty 10

## 2018-04-10 NOTE — Evaluation (Signed)
Clinical/Bedside Swallow Evaluation Patient Details  Name: Heather Espinoza MRN: [id number] Date of Birth: June 30, 1945  Today's Date: 04/10/2018 Time: SLP Start Time (ACUTE ONLY): 1140 SLP Stop Time (ACUTE ONLY): 1155 SLP Time Calculation (min) (ACUTE ONLY): 15 min  Past Medical History:  Past Medical History:  Diagnosis Date  . Arthritis   . COPD (chronic obstructive pulmonary disease) ([place])   . Diabetes ([place])    type 2 x 18 months.  . Diverticulitis   . Diverticulosis   . Fibromyalgia   . High cholesterol   . Hypertension    4-5 yrs  . Hypothyroid   . IBS (irritable bowel syndrome)   . Kidney stone    Left Kidney  . Osteopenia   . Plantar fasciitis   . Restless leg   . Sleep apnea   . Tachycardia    per pt/fim  . Tendonitis    Right Wrist  . Tremor, essential 01/07/2018  . UTI (lower urinary tract infection)    Past Surgical History:  Past Surgical History:  Procedure Laterality Date  . APPENDECTOMY    . BIOPSY  12/25/2016   Procedure: BIOPSY;  Surgeon: Rogene Houston, MD;  [facility];  Service: Endoscopy;;  gastric   . BREAST LUMPECTOMY Right 2001  . CATARACT EXTRACTION Bilateral 2016  . CHOLECYSTECTOMY    . COLONOSCOPY N/A 12/17/2012   Procedure: COLONOSCOPY;  Surgeon: Rogene Houston, MD;  [facility];  Service: Endoscopy;  Laterality: N/A;  215  . complete hysterectomy    . ESOPHAGEAL DILATION N/A 12/25/2016   Procedure: ESOPHAGEAL DILATION;  Surgeon: Rogene Houston, MD;  [facility];  Service: Endoscopy;  Laterality: N/A;  . ESOPHAGOGASTRODUODENOSCOPY N/A 12/25/2016   Procedure: ESOPHAGOGASTRODUODENOSCOPY (EGD);  Surgeon: Rogene Houston, MD;  [facility];  Service: Endoscopy;  Laterality: N/A;  730  . Heel tumor removed    . rt elbow surgery    . TONSILLECTOMY     HPI:  Heather Espinoza is a 73 y.o. female with medical history significant of COPD with alpha-1 antitrypsin deficiency, type 2 diabetes on  metformin, hypertension, hyperlipidemia, hypothyroidism who was recently admitted to [facility] for pneumonia and sepsis and was discharged around the 16th, comes back to the emergency room with persistent shortness of breath, wheezing and weakness. CT chest shows Bibasilar subsegmental consolidative changes and scattered bilateral confluent hazy and nodular densities most consistent with pneumonia. Pt had esophogram in 2018 that showed reflux while recumbent, tablet lodged at GE junction. F/u endoscopy found web in the proximal esophagus. Dilated and disrupted   Assessment / Plan / Recommendation Clinical Impression  Pt sitting up in chair and alert, as indicated by greeting SLP upon arrival and reporting recent difficulties with swallowing pills and some foods. 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 Pt tolerated several large, consecutive sips of thin liquid with no s/sx of aspiration or reports of discomfort. A feeling of globus sensation was present during PO trial of regular solid texture. Discomfort with texture evident by facial grimaces. Pt required several sips of thin liquid to wash bolus down. She indicated that she was not choking, but felt the bolus was hung in her throat, indicative of esophageal dysfunction and not oropharyngeal dysphagia. Pt educated regarding nature of swallowing problem and advised to consult with GI doctor. Recommend continuation of  current diet (regular, thin) and esophageal work-up if pt has further complaints of swallowing function.   SLP Visit Diagnosis: Dysphagia, unspecified (R13.10)    Aspiration Risk       Diet Recommendation Regular;Thin liquid   Supervision: Patient able to self feed    Other  Recommendations Oral Care Recommendations: Oral care BID   Follow up Recommendations        Frequency and Duration             Prognosis        Swallow Study   General HPI: Heather Espinoza is a 73 y.o. female with medical history significant of COPD with alpha-1 antitrypsin deficiency, type 2 diabetes on metformin, hypertension, hyperlipidemia, hypothyroidism who was recently admitted to [facility] for pneumonia and sepsis and was discharged around the 16th, comes back to the emergency room with persistent shortness of breath, wheezing and weakness. CT chest shows Bibasilar subsegmental consolidative changes and scattered bilateral confluent hazy and nodular densities most consistent with pneumonia. Pt had esophogram in 2018 that showed reflux while recumbent, tablet lodged at GE junction. F/u endoscopy found web in the proximal esophagus. Dilated and disrupted Type of Study: Bedside Swallow Evaluation Diet Prior to this Study: Regular;Thin liquids Temperature Spikes Noted: No Respiratory Status: Room air History of Recent Intubation: No Behavior/Cognition: Alert;Cooperative;Pleasant mood Oral Cavity Assessment: Within Functional Limits Oral Care Completed by SLP: No Oral Cavity - Dentition: Adequate natural dentition;Missing dentition Vision: Functional for self-feeding Self-Feeding Abilities: Able to feed self Patient Positioning: Upright in chair Baseline Vocal Quality: Normal Volitional Cough: Strong Volitional Swallow: Able to elicit    Oral/Motor/Sensory Function Overall Oral Motor/Sensory Function: Within functional limits   Ice Chips Ice chips: Not tested   Thin Liquid Thin Liquid: Within functional limits Presentation: Cup;Straw    Nectar Thick Nectar Thick Liquid: Not tested   Honey Thick Honey Thick Liquid: Not tested   Puree Puree: Not tested   Solid     Solid: Within functional limits Presentation: Self Fed      Izzy Guyla Bless 04/10/2018,1:26 PM

## 2018-04-10 NOTE — Care Management Note (Addendum)
Case Management Note  Patient Details  Name: Heather Espinoza MRN: [id number] Date of Birth: [birth year]  Subjective/Objective:  From home with spouse, recurrent pna strep pna positive, per pt eval rec HHPT, 3 n 1 and rollator.  NCM offered choice from Medicare.gov list, and left with her to choose.  She states she would like NCM to help her get another PCP here in [place] , [place] scheduled her an apt with [facility] to see Ulyses Southward on 1/29 at 11:15 .    1/24- Tomi Bamberger RN, BSN - NCM spoke with patient she decided she wanted NCM to try all 4 agencies on the Medicare.gov list, NCM contacted [facility] and they said her insurance was not in network with them,  NCM tried [facility] and waiting to hear back from [facility], fax [phone].  Vaughan Basta with [facility] called back stating patient 's insurance is not in network and they can not take referral.  NCM will try [facility], they are out of network also. NCM called [facility] spoke with Tasha at [phone] and she states they are in network with Bernadene Person, NCM faxed referral to them.  Awaiting to hear back, [facility] Boss states they do not service patient's area, it is too far for them.    NCM spoke with physical therapist Hilliard Clark, he states patient can do outpatient physical therapy.  NCM faxed pt note and insurance information to [facility] phone [phone] and fax [phone].  D                 Action/Plan: DC home when 3 n 1 and rollator has been delivered to patient room.    Expected Discharge Date:                  Expected Discharge Plan:  [place]  In-House Referral:     Discharge planning Services  CM Consult  Post Acute Care Choice:  Durable Medical Equipment, Home Health Choice offered to:  Patient  DME Arranged:  Bedside commode, Walker rolling with seat DME Agency:  [place]:    [place] Agency:     Status of Service:  In  process, will continue to follow  If discussed at Long Length of Stay Meetings, dates discussed:    Additional Comments:  Zenon Mayo, RN 04/10/2018, 4:45 PM

## 2018-04-10 NOTE — Progress Notes (Signed)
PROGRESS NOTE  JACKOLYN GERON QMV:[id number] DOB: 1945/04/04 DOA: 04/08/2018 PCP: Monico Blitz, MD  Brief History   73 year old woman PMH COPD recently hospitalized at [facility] for severe sepsis secondary to pneumonia January 12, discharged on the 17th, since that time she has had persistent fatigue despite completing course of antibiotics.  She has had poor oral intake and ongoing cough.  A & P  Strep pneumoniae pneumonia --Strep pneumoniae antigen positive and MRSA PCR negative.  Stop vancomycin.  Narrow antibiotic to ceftriaxone.  Hypokalemia --Resolved  Hypomagnesemia --Replete  Prolonged QT --Check EKG in a.m.  COPD, alpha-1 antitrypsin deficiency, not on oxygen --Appears stable.  No hypoxia.  No albuterol as needed.  Diabetes mellitus type 2 on metformin outpatient --CBG stable.  Continue sliding scale insulin.  Hypothyroidism --Continue levothyroxine  DVT prophylaxis: enoxaparin Code Status: Full Family Communication: none Disposition Plan: home    Murray Hodgkins, MD  [facility] Direct contact: see [email or web address]  7PM-7AM contact night coverage as above 04/10/2018, 2:45 PM  LOS: 1 day   Consultants  . None  Procedures  . None  Antibiotics  . Cefepime 1/22 . Ceftriaxone 1/23 >  Interval History/Subjective  Feels better today.  Breathing better.  Some cough.  Objective   Vitals:  Vitals:   04/10/18 0820 04/10/18 0822  BP: 118/63 118/63  Pulse: 60 60  Resp:  20  Temp: 97.7 F (36.5 C) 97.7 F (36.5 C)  SpO2: 94% 94%    Exam:  Constitutional:  . Appears calm and comfortable Eyes:  . pupils and irises appear normal ENMT:  . grossly normal hearing  . Tongue appears unremarkable Respiratory:  . Bilateral posterior crackles at the bases. Marland Kitchen Respiratory effort mildly increased. Cardiovascular:  . RRR, no m/r/g . No LE extremity edema   Abdomen:  . Soft, nontender, nondistended Psychiatric:  . Mental status o Mood, affect  appropriate  I have personally reviewed the following:   Today's Data  . 9.2, platelets 343, WBC 5.8 . Magnesium is 1.4 yesterday . CMP unremarkable  Lab Data  . Reviewed  Micro Data  . Strep pneumoniae urinary antigen positive  Imaging  . Chest x-ray showed left base pneumonia . CT chest showed bibasilar consolidative changes consistent with pneumonia  Cardiology Data  . EKG showed sinus tachycardia with prolonged QT, no old available for comparison  Other Data  .   Scheduled Meds: . amLODipine  5 mg Oral Daily  . aspirin EC  81 mg Oral Daily  . buPROPion  150 mg Oral Daily  . cholecalciferol  1,000 Units Oral BID  . DULoxetine  60 mg Oral BID  . enoxaparin (LOVENOX) injection  40 mg Subcutaneous Q24H  . fluticasone furoate-vilanterol  1 puff Inhalation Daily  . gabapentin  400 mg Oral BID  . insulin aspart  0-5 Units Subcutaneous QHS  . insulin aspart  0-9 Units Subcutaneous TID WC  . ipratropium-albuterol  3 mL Nebulization Once  . levothyroxine  37.5 mcg Oral QAC breakfast  . magnesium oxide  400 mg Oral BID  . metoprolol succinate  12.5 mg Oral QHS  . pantoprazole  40 mg Oral Daily  . potassium chloride  40 mEq Oral BID  . propranolol  20 mg Oral BID  . simvastatin  20 mg Oral QPM  . sodium chloride flush  3 mL Intravenous Once  . traZODone  100 mg Oral QHS  . vitamin E  400 Units Oral Daily   Continuous Infusions: . cefTRIAXone (  ROCEPHIN)  IV 1 g (04/10/18 1210)    Principal Problem:   Streptococcus pneumoniae pneumonia ([place]) Active Problems:   Essential hypertension, benign   Diabetes (HCC)   IBS (irritable bowel syndrome)   GERD (gastroesophageal reflux disease)   Lobar pneumonia (HCC)   Prolonged QT interval   LOS: 1 day

## 2018-04-10 NOTE — Social Work (Signed)
Spiritual Care aware of pt desire to complete AD. When pt able please contact Spiritual Care to assist with completion.  CSW signing off. Please consult if any additional needs arise.  Alexander Mt, [place] Work [phone]

## 2018-04-10 NOTE — Evaluation (Signed)
Physical Therapy Evaluation Patient Details Name: Heather Espinoza MRN: [id number] DOB: Mar 12, 1946 Today's Date: 04/10/2018   History of Present Illness  73 y.o. female with medical history significant of COPD with alpha-1 antitrypsin deficiency, type 2 diabetes on metformin, hypertension, hyperlipidemia, hypothyroidism who was recently admitted to another hospital for pneumonia and sepsis and was discharged about 5 days ago comes back to the emergency room with persistent shortness of breath, wheezing and weakness.    Clinical Impression  Pt admitted with above diagnosis. Pt currently with functional limitations due to the deficits listed below (see PT Problem List). PTA pt living at home with husband, independent at baseline no home O2 but reports one syncopal event early January. Today VSS on RA throughout session, pt with decreased activity tolerance becomes unsteady due to deconditioning and weakness.  Rec HHPT to safely progress activity. Pt will benefit from skilled PT to increase their independence and safety with mobility to allow discharge to the venue listed below.        Follow Up Recommendations Home health PT;Supervision for mobility/OOB    Equipment Recommendations  (Rollator)    Recommendations for Other Services       Precautions / Restrictions Restrictions Weight Bearing Restrictions: No      Mobility  Bed Mobility Overal bed mobility: Modified Independent                Transfers Overall transfer level: Needs assistance Equipment used: None Transfers: Sit to/from Stand Sit to Stand: Supervision            Ambulation/Gait Ambulation/Gait assistance: Min guard Gait Distance (Feet): 120 Feet Assistive device: None Gait Pattern/deviations: Step-to pattern;Step-through pattern Gait velocity: decrease   General Gait Details: Pt ambulating well for short distance 30-40' with increasing instability and LOB x3 requriing min A to stabilize. woudl benefit  from [place]or due to decondionting. VSS on Ra  Stairs            Wheelchair Mobility    Modified Rankin (Stroke Patients Only)       Balance Overall balance assessment: Mild deficits observed, not formally tested                                           Pertinent Vitals/Pain Pain Assessment: Faces Faces Pain Scale: Hurts a little bit Pain Location: throat Pain Descriptors / Indicators: Discomfort Pain Intervention(s): Limited activity within patient's tolerance    Home Living Family/patient expects to be discharged to:: Private residence Living Arrangements: Spouse/significant other Available Help at Discharge: Family Type of Home: House Home Access: Level entry     Home Layout: One level Home Equipment: None      Prior Function Level of Independence: Independent         Comments: drives, grocery shops, most days she is at home per her report     Hand Dominance        Extremity/Trunk Assessment   Upper Extremity Assessment Upper Extremity Assessment: Generalized weakness    Lower Extremity Assessment Lower Extremity Assessment: Overall WFL for tasks assessed       Communication   Communication: No difficulties  Cognition Arousal/Alertness: Awake/alert Behavior During Therapy: WFL for tasks assessed/performed Overall Cognitive Status: Within Functional Limits for tasks assessed  General Comments      Exercises     Assessment/Plan    PT Assessment Patient needs continued PT services  PT Problem List Decreased strength       PT Treatment Interventions DME instruction;Gait training;Stair training;Functional mobility training;Therapeutic activities;Therapeutic exercise    PT Goals (Current goals can be found in the Care Plan section)  Acute Rehab PT Goals Patient Stated Goal: to go home PT Goal Formulation: With patient Potential to Achieve Goals: Good     Frequency Min 3X/week   Barriers to discharge        Co-evaluation               AM-PAC PT "6 Clicks" Mobility  Outcome Measure Help needed turning from your back to your side while in a flat bed without using bedrails?: None Help needed moving from lying on your back to sitting on the side of a flat bed without using bedrails?: None Help needed moving to and from a bed to a chair (including a wheelchair)?: None Help needed standing up from a chair using your arms (e.g., wheelchair or bedside chair)?: None Help needed to walk in hospital room?: A Little Help needed climbing 3-5 steps with a railing? : A Little 6 Click Score: 22    End of Session Equipment Utilized During Treatment: Gait belt Activity Tolerance: Patient tolerated treatment well Patient left: in chair;with call bell/phone within reach Nurse Communication: Mobility status PT Visit Diagnosis: Unsteadiness on feet (R26.81)    Time: 1435-1500 PT Time Calculation (min) (ACUTE ONLY): 25 min   Charges:   PT Evaluation $PT Eval Low Complexity: 1 Low PT Treatments $Gait Training: 8-22 mins        Reinaldo Berber, PT, DPT Acute Rehabilitation Services Pager: [phone] Office: [place] 04/10/2018, 3:10 PM

## 2018-04-10 NOTE — Evaluation (Signed)
Occupational Therapy Evaluation Patient Details Name: Heather Espinoza MRN: [id number] DOB: 1945-05-12 Today's Date: 04/10/2018    History of Present Illness 73 y.o. female with medical history significant of COPD with alpha-1 antitrypsin deficiency, type 2 diabetes on metformin, hypertension, hyperlipidemia, hypothyroidism who was recently admitted to another hospital for pneumonia and sepsis and was discharged about 5 days ago comes back to the emergency room with persistent shortness of breath, wheezing and weakness.   Clinical Impression   Pt admitted with the above diagnoses and presents with below problem list. Pt will benefit from continued acute OT to address the below listed deficits and maximize independence with basic ADLs prior to d/c home. PTA pt was independent with ADLs. Pt is mostly setup to supervision with UB ADLs, min guard for LB ADLs and shower transfers. Functional mobility in the room and toilet transfer at supervision level.      Follow Up Recommendations  No OT follow up    Equipment Recommendations  3 in 1 bedside commode    Recommendations for Other Services       Precautions / Restrictions Restrictions Weight Bearing Restrictions: No      Mobility Bed Mobility Overal bed mobility: Modified Independent                Transfers Overall transfer level: Needs assistance Equipment used: None Transfers: Sit to/from Stand Sit to Stand: Supervision         General transfer comment: from EOB and toilet. Pt reported some dizziness on initial stand    Balance Overall balance assessment: Mild deficits observed, not formally tested                                         ADL either performed or assessed with clinical judgement   ADL Overall ADL's : Needs assistance/impaired Eating/Feeding: Set up;Sitting   Grooming: Supervision/safety;Standing   Upper Body Bathing: Set up;Sitting   Lower Body Bathing: Min guard;Sit to/from  stand   Upper Body Dressing : Set up;Sitting   Lower Body Dressing: Min guard;Sit to/from stand   Toilet Transfer: Supervision/safety;Ambulation   Toileting- Clothing Manipulation and Hygiene: Supervision/safety;Sit to/from stand   Tub/ Shower Transfer: Walk-in shower;Min guard   Functional mobility during ADLs: Supervision/safety General ADL Comments: Pt completed toilet transfer, pericare, grooming task standing at sink, and in-room functional mobility. Discussed fall prevention tips and ways to build activity tolerance.     Vision         Perception     Praxis      Pertinent Vitals/Pain Pain Assessment: Faces Faces Pain Scale: Hurts a little bit Pain Location: chronic back pain Pain Descriptors / Indicators: Aching Pain Intervention(s): Limited activity within patient's tolerance;Monitored during session;Repositioned     Hand Dominance     Extremity/Trunk Assessment Upper Extremity Assessment Upper Extremity Assessment: Generalized weakness   Lower Extremity Assessment Lower Extremity Assessment: Defer to PT evaluation       Communication Communication Communication: No difficulties   Cognition Arousal/Alertness: Awake/alert Behavior During Therapy: WFL for tasks assessed/performed Overall Cognitive Status: Within Functional Limits for tasks assessed                                     General Comments       Exercises     Shoulder Instructions  Home Living Family/patient expects to be discharged to:: Private residence Living Arrangements: Spouse/significant other Available Help at Discharge: Family Type of Home: House Home Access: Level entry     [place]: One level     Bathroom Shower/Tub: Walk-in shower(separate garden tub)   Biochemist, clinical: [place]: None          Prior Functioning/Environment Level of Independence: Independent        Comments: drives, grocery shops, most days she is  at home per her report        OT Problem List: Decreased activity tolerance;Impaired balance (sitting and/or standing);Decreased strength;Decreased knowledge of use of DME or AE;Decreased knowledge of precautions;Cardiopulmonary status limiting activity;Pain      OT Treatment/Interventions: Self-care/ADL training;Therapeutic exercise;Energy conservation;DME and/or AE instruction;Therapeutic activities;Patient/family education;Balance training    OT Goals(Current goals can be found in the care plan section) Acute Rehab OT Goals Patient Stated Goal: not specifically stated but mentioned wanting to eat better and increase activity tolerance OT Goal Formulation: With patient Time For Goal Achievement: 04/24/18 Potential to Achieve Goals: Good ADL Goals Pt Will Perform Upper Body Bathing: (P) with modified independence;sitting Pt Will Perform Lower Body Bathing: (P) with modified independence;sit to/from stand Pt Will Perform Tub/Shower Transfer: (P) Shower transfer;with modified independence;ambulating Additional ADL Goal #1: (P) Pt will independently state 2 energy conservation strategies for use to manage ADLs.  OT Frequency: Min 2X/week   Barriers to D/C:            Co-evaluation              AM-PAC OT "6 Clicks" Daily Activity     Outcome Measure Help from another person eating meals?: None Help from another person taking care of personal grooming?: None Help from another person toileting, which includes using toliet, bedpan, or urinal?: None Help from another person bathing (including washing, rinsing, drying)?: A Little Help from another person to put on and taking off regular upper body clothing?: None Help from another person to put on and taking off regular lower body clothing?: None 6 Click Score: 23   End of Session    Activity Tolerance: Patient tolerated treatment well Patient left: in chair;with call bell/phone within reach  OT Visit Diagnosis: Unsteadiness  on feet (R26.81);Muscle weakness (generalized) (M62.81)                Time: 1000-1025 OT Time Calculation (min): 25 min Charges:  OT General Charges $OT Visit: 1 Visit OT Evaluation $OT Eval Low Complexity: 1 Low OT Treatments $Self Care/Home Management : 8-22 mins  Tyrone Schimke, OT Acute Rehabilitation Services Pager: [phone] Office: [phone]   Hortencia Pilar 04/10/2018, 10:42 AM

## 2018-04-10 NOTE — Progress Notes (Signed)
Patient not available. Gave nurse AD material and told her to page chaplain when patient is ready to complete the forms. Conard Novak, Chaplain   04/10/18 1400  Clinical Encounter Type  Visited With Patient not available;Health care provider  Visit Type Other (Comment)  Referral From Patient  Consult/Referral To Chaplain  Spiritual Encounters  Spiritual Needs Literature

## 2018-04-11 LAB — GLUCOSE, CAPILLARY
Glucose-Capillary: 107 mg/dL — ABNORMAL HIGH (ref 70–99)
Glucose-Capillary: 83 mg/dL (ref 70–99)
Glucose-Capillary: 99 mg/dL (ref 70–99)
Glucose-Capillary: 100 mg/dL — ABNORMAL HIGH (ref 70–99)

## 2018-04-11 NOTE — Progress Notes (Addendum)
Physical Therapy Treatment Patient Details Name: Heather Espinoza MRN: [id number] DOB: 12-30-1945 Today's Date: 04/11/2018    History of Present Illness 73 y.o. female with medical history significant of COPD with alpha-1 antitrypsin deficiency, type 2 diabetes on metformin, hypertension, hyperlipidemia, hypothyroidism who was recently admitted to another hospital for pneumonia and sepsis and was discharged about 5 days ago comes back to the emergency room with persistent shortness of breath, wheezing and weakness.    PT Comments    Patient progressing this session ambulating further distances. VSS on RA. Patient cont to buckle and LOB due to weakness. Session focused on stair training pt demonstrating good safe technique.    Follow Up Recommendations  Outpatient Physical Therapy, Supervision OOB mobility.      Equipment Recommendations  None recommended by PT    Recommendations for Other Services       Precautions / Restrictions Restrictions Weight Bearing Restrictions: No    Mobility  Bed Mobility Overal bed mobility: Modified Independent                Transfers Overall transfer level: Needs assistance Equipment used: None Transfers: Sit to/from Stand Sit to Stand: Supervision            Ambulation/Gait Ambulation/Gait assistance: Supervision;Min guard Gait Distance (Feet): 120 Feet Assistive device: None Gait Pattern/deviations: Step-to pattern;Step-through pattern Gait velocity: decrease   General Gait Details: VSS on RA, no overt LOB but towards end of stair training legs buckling from weakness, with rest able to return to room without issue.    Stairs             Wheelchair Mobility    Modified Rankin (Stroke Patients Only)       Balance Overall balance assessment: Mild deficits observed, not formally tested                                          Cognition Arousal/Alertness: Awake/alert Behavior During Therapy:  WFL for tasks assessed/performed Overall Cognitive Status: Within Functional Limits for tasks assessed                                        Exercises      General Comments        Pertinent Vitals/Pain Pain Assessment: Faces Faces Pain Scale: Hurts a little bit Pain Location: throat Pain Descriptors / Indicators: Discomfort Pain Intervention(s): Limited activity within patient's tolerance;Monitored during session    Home Living                      Prior Function            PT Goals (current goals can now be found in the care plan section) Acute Rehab PT Goals Patient Stated Goal: to go home PT Goal Formulation: With patient Potential to Achieve Goals: Good Progress towards PT goals: Progressing toward goals    Frequency    Min 3X/week      PT Plan Current plan remains appropriate    Co-evaluation              AM-PAC PT "6 Clicks" Mobility   Outcome Measure  Help needed turning from your back to your side while in a flat bed without using bedrails?: None Help needed moving from lying  on your back to sitting on the side of a flat bed without using bedrails?: None Help needed moving to and from a bed to a chair (including a wheelchair)?: None Help needed standing up from a chair using your arms (e.g., wheelchair or bedside chair)?: None Help needed to walk in hospital room?: A Little Help needed climbing 3-5 steps with a railing? : A Little 6 Click Score: 22    End of Session Equipment Utilized During Treatment: Gait belt Activity Tolerance: Patient tolerated treatment well Patient left: in chair;with call bell/phone within reach Nurse Communication: Mobility status PT Visit Diagnosis: Unsteadiness on feet (R26.81)     Time: 8333-8329 PT Time Calculation (min) (ACUTE ONLY): 33 min  Charges:  $Gait Training: 23-37 mins                     Reinaldo Berber, PT, DPT Acute Rehabilitation Services Pager:  [phone] Office: [place] 04/11/2018, 11:28 AM

## 2018-04-11 NOTE — Progress Notes (Signed)
Nutrition Brief Note  Patient identified on the Malnutrition Screening Tool (MST) Report.  Wt Readings from Last 15 Encounters:  04/09/18 62.8 kg  01/07/18 62.8 kg  06/04/17 63.6 kg  05/17/17 65.1 kg  12/04/16 66.5 kg  11/07/16 65.5 kg  05/10/16 67.6 kg  10/28/15 64.9 kg  10/25/15 65.3 kg  10/07/15 64.6 kg  01/04/15 64.3 kg  06/14/14 65.2 kg  12/08/13 64.5 kg  08/18/13 65.6 kg  02/17/13 68.4 kg   Body mass index is 25.74 kg/m. Patient meets criteria for Overweight based on current BMI. Per above encounters, pt's weight has been stable.  Current diet order is Carbohydrate Modified, pt is consuming approximately 100% of meals at this time. Labs and medications reviewed.   No nutrition interventions warranted at this time. If nutrition issues arise, please consult RD.   Arthur Holms, RD, LDN Pager #: [phone] After-Hours Pager #: [phone]

## 2018-04-11 NOTE — Care Management Important Message (Signed)
Important Message  Patient Details  Name: Heather Espinoza MRN: [id number] Date of Birth: 06-Jun-1945   Medicare Important Message Given:  Yes    Izaya Netherton P [place] 04/11/2018, 11:06 AM

## 2018-04-11 NOTE — Progress Notes (Addendum)
PROGRESS NOTE  Heather Espinoza WJX:[id number] DOB: 11-30-1945 DOA: 04/08/2018 PCP: Monico Blitz, MD  Brief History   73 year old woman PMH COPD recently hospitalized at [facility] for severe sepsis secondary to pneumonia January 12, discharged on the 17th, since that time she has had persistent fatigue despite completing course of antibiotics.  She has had poor oral intake and ongoing cough.  A & P  Strep pneumoniae pneumonia --Improving but still short of breath with ambulation.  Continue ceftriaxone today.  Hypokalemia --Resolved  Hypomagnesemia --Repleted  Prolonged QT --Resolved.  EKG shows normal QTC.  COPD, alpha-1 antitrypsin deficiency, not on oxygen --Remains stable.  No hypoxia.  No albuterol as needed.  Diabetes mellitus type 2 on metformin outpatient --CBG appears stable.  Continue sliding scale insulin.  Hypothyroidism --Continue levothyroxine  Seems to be improving.  Anticipate discharge 1/25 if continues to improve.  DVT prophylaxis: enoxaparin Code Status: Full Family Communication: none Disposition Plan: home with HHPT   Murray Hodgkins, MD  [facility] Direct contact: see [email or web address]  7PM-7AM contact night coverage as above 04/11/2018, 2:49 PM  LOS: 2 days   Consultants  . None  Procedures  . None  Antibiotics  . Cefepime 1/22 . Ceftriaxone 1/23 >  Interval History/Subjective  Feels better but still short of breath especially when ambulating.  Objective   Vitals:  Vitals:   04/11/18 0904 04/11/18 0936  BP:    Pulse:  65  Resp: 18   Temp:    SpO2:      Exam:  Constitutional:   . Appears calm and comfortable Respiratory:  . Posterior crackles bilaterally . Respiratory effort normal.  Cardiovascular:  . RRR, no m/r/g . No LE extremity edema   Psychiatric:  . Mental status o Mood, affect appropriate  I have personally reviewed the following:   Today's Data  . CBG stable . EKG showed sinus rhythm, normal  QTC  Lab Data  .   Micro Data  . Strep pneumoniae urinary antigen positive  Imaging  . Chest x-ray showed left base pneumonia . CT chest showed bibasilar consolidative changes consistent with pneumonia  Cardiology Data  . EKG showed sinus tachycardia with prolonged QT, no old available for comparison  Other Data  .   Scheduled Meds: . amLODipine  5 mg Oral Daily  . aspirin EC  81 mg Oral Daily  . buPROPion  150 mg Oral Daily  . cholecalciferol  1,000 Units Oral BID  . DULoxetine  60 mg Oral BID  . enoxaparin (LOVENOX) injection  40 mg Subcutaneous Q24H  . fluticasone furoate-vilanterol  1 puff Inhalation Daily  . gabapentin  400 mg Oral BID  . insulin aspart  0-5 Units Subcutaneous QHS  . insulin aspart  0-9 Units Subcutaneous TID WC  . ipratropium-albuterol  3 mL Nebulization Once  . levothyroxine  37.5 mcg Oral QAC breakfast  . magnesium oxide  400 mg Oral BID  . metoprolol succinate  12.5 mg Oral QHS  . pantoprazole  40 mg Oral Daily  . propranolol  20 mg Oral BID  . simvastatin  20 mg Oral QPM  . sodium chloride flush  3 mL Intravenous Once  . traZODone  100 mg Oral QHS  . vitamin E  400 Units Oral Daily   Continuous Infusions: . cefTRIAXone (ROCEPHIN)  IV 1 g (04/11/18 1032)    Principal Problem:   Streptococcus pneumoniae pneumonia ([place]) Active Problems:   Essential hypertension, benign   Diabetes ([place])   IBS (  irritable bowel syndrome)   GERD (gastroesophageal reflux disease)   Lobar pneumonia (H[facility])   Prolonged QT interval   LOS: 2 days

## 2018-04-11 NOTE — Plan of Care (Signed)
  Problem: Spiritual Needs Goal: Ability to function at adequate level Outcome: Progressing   Problem: Elimination: Goal: Will not experience complications related to bowel motility Outcome: Adequate for Discharge Goal: Will not experience complications related to urinary retention Outcome: Adequate for Discharge   Problem: Safety: Goal: Ability to remain free from injury will improve Outcome: Adequate for Discharge   Problem: Skin Integrity: Goal: Risk for impaired skin integrity will decrease Outcome: Adequate for Discharge

## 2018-04-12 DIAGNOSIS — J9601 Acute respiratory failure with hypoxia: Secondary | ICD-10-CM

## 2018-04-12 LAB — GLUCOSE, CAPILLARY
Glucose-Capillary: 101 mg/dL — ABNORMAL HIGH (ref 70–99)
Glucose-Capillary: 103 mg/dL — ABNORMAL HIGH (ref 70–99)

## 2018-04-12 LAB — CULTURE, RESPIRATORY W GRAM STAIN: Culture: NORMAL

## 2018-04-12 MED ORDER — CEFDINIR 300 MG PO CAPS: 300.0000 mg | ORAL_CAPSULE | Freq: Two times a day (BID) | ORAL | 0 refills | Status: DC

## 2018-04-12 NOTE — Progress Notes (Signed)
Occupational Therapy Treatment Patient Details Name: Heather Espinoza MRN: [id number] DOB: [birth year] Today's Date: 04/12/2018    History of present illness 73 y.o. female with medical history significant of COPD with alpha-1 antitrypsin deficiency, type 2 diabetes on metformin, hypertension, hyperlipidemia, hypothyroidism who was recently admitted to another hospital for pneumonia and sepsis and was discharged about 5 days ago comes back to the emergency room with persistent shortness of breath, wheezing and weakness.   OT comments  Provided energy conservation handouts for pt. And reviewed examples of how to implement into her daily routine.  Pt. Motivated and eager return home and begin exercising.   Follow Up Recommendations  No OT follow up    Equipment Recommendations  3 in 1 bedside commode    Recommendations for Other Services      Precautions / Restrictions         Mobility Bed Mobility                  Transfers                      Balance                                           ADL either performed or assessed with clinical judgement   ADL                                         General ADL Comments: provided energy conservation handouts and reviewed examples with pt. also had pt. provide examples of ways she currently utilizes energy conservation during her adls. she reports she "tries to but her family always says you need to walk more".  reviewed with her that she has to set boundaries and take rest breaks as needed. families are not always aware of how we are feeling and our illnesses.  pt. motivated and had several questions about setting up a treadmill at home.  advised her to get inst. and clearance from her doctor before beginning any exercise program.       Vision       Perception     Praxis      Cognition Arousal/Alertness: Awake/alert Behavior During Therapy: WFL for tasks  assessed/performed Overall Cognitive Status: Within Functional Limits for tasks assessed                                          Exercises     Shoulder Instructions       General Comments      Pertinent Vitals/ Pain       Pain Assessment: No/denies pain  Home Living                                          Prior Functioning/Environment              Frequency  Min 2X/week        Progress Toward Goals  OT Goals(current goals can now be found in the care plan section)  Progress towards OT goals: Progressing toward  goals     Plan      Co-evaluation                 AM-PAC OT "6 Clicks" Daily Activity     Outcome Measure   Help from another person eating meals?: None Help from another person taking care of personal grooming?: None Help from another person toileting, which includes using toliet, bedpan, or urinal?: None Help from another person bathing (including washing, rinsing, drying)?: A Little Help from another person to put on and taking off regular upper body clothing?: None Help from another person to put on and taking off regular lower body clothing?: None 6 Click Score: 23    End of Session    OT Visit Diagnosis: Unsteadiness on feet (R26.81);Muscle weakness (generalized) (M62.81)   Activity Tolerance Patient tolerated treatment well   Patient Left in bed;with call bell/phone within reach   Nurse Communication          Time: 4503-8882 OT Time Calculation (min): 20 min  Charges: OT General Charges $OT Visit: 1 Visit OT Treatments $Self Care/Home Management : 8-22 mins   Janice Coffin, COTA/L 04/12/2018, 11:04 AM

## 2018-04-12 NOTE — Progress Notes (Signed)
Requested RW and 3/1 to be delivered to [facility] prior to DC.

## 2018-04-12 NOTE — Discharge Summary (Signed)
Physician Discharge Summary  Heather Espinoza OEU:[id number] DOB: Jan 23, 1946 DOA: 04/08/2018  PCP: Monico Blitz, MD  Admit date: 04/08/2018 Discharge date: 04/12/2018  Recommendations for Outpatient Follow-up:   Resolution of pneumonia.  Follow-up Information    London Pepper, MD Follow up on 04/16/2018.   Specialty:  Family Medicine Why:  11;15 am for hospital follow and new patient, bring insurance card and ID.   Contact information: [address] [place] [address] [phone]        [facility] Follow up.   Why:  for outpatient physical therpay, please take the prescription with you also when you go. If you do not hear from them by Tuesday please call them at phone number listed. Contact information: [address] [place] [phone] [facility] Follow up.   Why:  RW and 3/1 to be delivered to room prior to DC Contact information: [address] [place] [address] [phone]            Discharge Diagnoses: Principal diagnosis is #1 1. Strep pneumoniae pneumonia 2. Hypokalemia 3. Hypomagnesemia 4. Prolonged QT 5. COPD, alpha-1 antitrypsin deficiency, not on oxygen 6. Diabetes mellitus type 2 on metformin outpatient 7. Hypothyroidism  Discharge Condition: improved Disposition: home  Diet recommendation: heart healthy  Filed Weights   04/09/18 0800  Weight: 62.8 kg    History of present illness:  73 year old woman PMH COPD recently hospitalized at [facility] for severe sepsis secondary to pneumonia January 12, discharged on the 17th, since that time she has had persistent fatigue despite completing course of antibiotics.  She has had poor oral intake and ongoing cough.  Hospital Course:  Patient was treated with empiric antibiotics, subsequently narrowed to ceftriaxone based on positive strep pneumoniae urinary antigen.  She did well, hospitalization  was uncomplicated and she was discharged on oral antibiotics.  Individual issues as below.  Strep pneumoniae pneumonia  Hypokalemia --Resolved  Hypomagnesemia --Repleted  Prolonged QT --Resolved.  EKG shows normal QTC.  COPD, alpha-1 antitrypsin deficiency, not on oxygen --Remained stable.  No hypoxia.  No albuterol as needed.  Diabetes mellitus type 2 on metformin outpatient --CBG stable.    Hypothyroidism --Continue levothyroxine  Consultants   None  Procedures   None  Antibiotics   Cefepime 1/22  Ceftriaxone 1/23 > 1/25  Cefdinir 1/25 >  Today's assessment: S: Patient feels well, breathing better, ambulating without difficulty. O: Vitals:  Vitals:   04/12/18 0726 04/12/18 0857  BP: 117/60   Pulse: 62   Resp:    Temp: 98.2 F (36.8 C)   SpO2: 97% 97%    Constitutional:  . Appears calm and comfortable Respiratory:  . CTA bilaterally, no w/r/r.  . Respiratory effort normal. Cardiovascular:  . RRR, no m/r/g Psychiatric:  . Mental status o Mood, affect appropriate  Discharge Instructions  Discharge Instructions    Diet - low sodium heart healthy   Complete by:  As directed    Discharge instructions   Complete by:  As directed    Call your physician or seek immediate medical attention for fever, shortness of breath or worsening of condition.   Increase activity slowly   Complete by:  As directed      Allergies as of 04/12/2018      Reactions   Flagyl [metronidazole] Shortness Of Breath, Swelling   Adhesive [tape] Other (See Comments)  Takes off patient's skin   Aspirin Other (See Comments)   Stomach bleed   Lyrica [pregabalin] Other (See Comments)   Breathing Problems   Latex Itching, Rash   Phenergan [promethazine Hcl] Other (See Comments)   Caused grogginess, altered mental status   Prednisone Anxiety   Sulfa Antibiotics Nausea Only   Tetanus Toxoids Swelling   Arm Area      Medication List    TAKE these medications    acetaminophen 500 MG tablet Commonly known as:  TYLENOL Take 500 mg by mouth every 6 (six) hours as needed for pain.   albuterol 108 (90 Base) MCG/ACT inhaler Commonly known as:  PROVENTIL HFA;VENTOLIN HFA Inhale 2 puffs into the lungs every 6 (six) hours as needed for wheezing or shortness of breath.   amLODipine 5 MG tablet Commonly known as:  NORVASC Take 5 mg by mouth daily.   aspirin EC 81 MG tablet Take 1 tablet (81 mg total) by mouth daily.   Biotin 5000 MCG Caps Take 1 capsule by mouth daily.   bisacodyl 10 MG suppository Commonly known as:  DULCOLAX Place 1 suppository (10 mg total) rectally as needed for moderate constipation.   buPROPion 150 MG 24 hr tablet Commonly known as:  WELLBUTRIN XL Take 1 tablet by mouth daily.   cefdinir 300 MG capsule Commonly known as:  OMNICEF Take 1 capsule (300 mg total) by mouth 2 (two) times daily. Start taking on:  April 13, 2018   cholecalciferol 1000 units tablet Commonly known as:  VITAMIN D Take 1,000 Units by mouth 2 (two) times daily.   dicyclomine 10 MG capsule Commonly known as:  BENTYL Take 1 capsule (10 mg total) by mouth 2 (two) times daily as needed for spasms.   diphenhydrAMINE 25 mg capsule Commonly known as:  BENADRYL Take 25 mg by mouth every 6 (six) hours as needed (for itchy rash.).   docusate sodium 100 MG capsule Commonly known as:  COLACE Take 2 capsules (200 mg total) by mouth at bedtime. What changed:    when to take this  reasons to take this   DULoxetine 60 MG capsule Commonly known as:  CYMBALTA Take 1 capsule (60 mg total) by mouth 2 (two) times daily.   esomeprazole 20 MG capsule Commonly known as:  NEXIUM Take 20 mg by mouth 2 (two) times daily before a meal.   Fluticasone-Salmeterol 250-50 MCG/DOSE Aepb Commonly known as:  ADVAIR Inhale 1 puff into the lungs every 12 (twelve) hours.   gabapentin 800 MG tablet Commonly known as:  NEURONTIN Take 400 mg by mouth 2 (two)  times daily.   hydrocortisone cream 0.5 % Apply 1 application topically 2 (two) times daily as needed for itching.   levothyroxine 50 MCG tablet Commonly known as:  SYNTHROID, LEVOTHROID Take 37.5 mcg by mouth daily before breakfast.   LIDOCAINE PAK 5 % ointment Generic drug:  lidocaine Apply 1 application topically 2 (two) times daily as needed for mild pain.   lisinopril 10 MG tablet Commonly known as:  PRINIVIL,ZESTRIL Take 20 mg by mouth daily.   lubiprostone 8 MCG capsule Commonly known as:  AMITIZA Take 2 capsules (16 mcg total) by mouth daily with breakfast. What changed:    when to take this  reasons to take this   metformin 500 MG (OSM) 24 hr tablet Commonly known as:  FORTAMET Take 500 mg by mouth daily with breakfast.   metoprolol succinate 25 MG 24 hr tablet Commonly known as:  TOPROL-XL Take 12.5 mg by mouth at bedtime.   ondansetron 4 MG tablet Commonly known as:  ZOFRAN TAKE 1 TABLET BY MOUTH TWICE DAILY AS NEEDED FOR NAUSEA AND VOMITING What changed:  See the new instructions.   propranolol 20 MG tablet Commonly known as:  INDERAL Take 1 tablet (20 mg total) by mouth 2 (two) times daily.   simvastatin 20 MG tablet Commonly known as:  ZOCOR Take 20 mg by mouth every evening.   traMADol 50 MG tablet Commonly known as:  ULTRAM Take 50 mg by mouth every 6 (six) hours as needed (for pain.).   traZODone 100 MG tablet Commonly known as:  DESYREL Take 100 mg by mouth at bedtime.   vitamin E 400 UNIT capsule Take 400 Units by mouth daily.      Allergies  Allergen Reactions  . Flagyl [Metronidazole] Shortness Of Breath and Swelling  . Adhesive [Tape] Other (See Comments)    Takes off patient's skin  . Aspirin Other (See Comments)    Stomach bleed  . Lyrica [Pregabalin] Other (See Comments)    Breathing Problems  . Latex Itching and Rash  . Phenergan [Promethazine Hcl] Other (See Comments)    Caused grogginess, altered mental status  .  Prednisone Anxiety  . Sulfa Antibiotics Nausea Only  . Tetanus Toxoids Swelling    Arm Area    The results of significant diagnostics from this hospitalization (including imaging, microbiology, ancillary and laboratory) are listed below for reference.    Significant Diagnostic Studies: Dg Chest 2 View  Result Date: 04/08/2018 CLINICAL DATA:  Chest pain EXAM: CHEST - 2 VIEW COMPARISON:  03/30/2017, 08/03/2017 FINDINGS: Persistent left lower lobe opacity. No pleural effusion. Normal heart size. No pneumothorax. IMPRESSION: Persistent left lower lobe opacity, may reflect ongoing pneumonia. Slightly convex margins on lateral view raises possibility of mass lesion. Chest CT could be obtained to further evaluate. Electronically Signed   By: Donavan Foil M.D.   On: 04/08/2018 19:55   Ct Chest Wo Contrast  Result Date: 04/09/2018 CLINICAL DATA:  73 year old female with recent diagnosis of pneumonia. EXAM: CT CHEST WITHOUT CONTRAST TECHNIQUE: Multidetector CT imaging of the chest was performed following the standard protocol without IV contrast. COMPARISON:  Chest radiograph dated 04/08/2018 FINDINGS: Evaluation of this exam is limited in the absence of intravenous contrast. Cardiovascular: There is no cardiomegaly. Minimal pericardial effusion. There is mild atherosclerotic calcification of the thoracic aorta. The central pulmonary arteries are grossly unremarkable on this noncontrast CT. Mediastinum/Nodes: No hilar or mediastinal adenopathy. Esophagus is grossly unremarkable. No mediastinal fluid collection. Lungs/Pleura: There are bibasilar subsegmental consolidative changes, left greater right which may represent pneumonia. Scattered confluent hazy densities in the upper lobes as well as areas of nodularity in the right middle lobe and lingula most consistent with pneumonia. There are small bilateral pleural effusions. No pneumothorax. The central airways are patent. Upper Abdomen: No acute abnormality.  Musculoskeletal: Degenerative changes. No acute osseous pathology. IMPRESSION: 1. Bibasilar subsegmental consolidative changes and scattered bilateral confluent hazy and nodular densities most consistent with pneumonia. Clinical correlation and follow-up to resolution recommended. 2. Small bilateral pleural effusions. Electronically Signed   By: Anner Crete M.D.   On: 04/09/2018 00:15    Microbiology: Recent Results (from the past 240 hour(s))  MRSA PCR Screening     Status: None   Collection Time: 04/09/18  6:50 PM  Result Value Ref Range Status   MRSA by PCR NEGATIVE NEGATIVE Final    Comment:  The GeneXpert MRSA Assay (FDA approved for NASAL specimens only), is one component of a comprehensive MRSA colonization surveillance program. It is not intended to diagnose MRSA infection nor to guide or monitor treatment for MRSA infections. Performed at [facility], [place] [address], [place], [place] [address]   Culture, sputum-assessment     Status: None   Collection Time: 04/09/18 11:15 PM  Result Value Ref Range Status   Specimen Description EXPECTORATED SPUTUM  Final   Special Requests NONE  Final   Sputum evaluation   Final    THIS SPECIMEN IS ACCEPTABLE FOR SPUTUM CULTURE Performed at [facility], [address], [place], [place] [address]    Report Status 04/10/2018 FINAL  Final  Culture, respiratory     Status: None   Collection Time: 04/09/18 11:15 PM  Result Value Ref Range Status   Specimen Description EXPECTORATED SPUTUM  Final   Special Requests NONE Reflexed from [id number]  Final   Gram Stain   Final    RARE WBC PRESENT,BOTH PMN AND MONONUCLEAR RARE SQUAMOUS EPITHELIAL CELLS PRESENT RARE GRAM POSITIVE COCCI RARE YEAST Performed at [facility], [place] [address] [place],  [address]    Culture FEW Consistent with normal respiratory flora.  Final   Report Status 04/12/2018 FINAL  Final     Labs: Basic Metabolic Panel: Recent Labs    Lab 04/08/18 1919 04/09/18 0935 04/10/18 0939  NA 141  --  143  K 2.5*  --  4.0  CL 106  --  110  CO2 25  --  27  GLUCOSE 130*  --  112*  BUN 6*  --  <5*  CREATININE 1.20*  --  1.03*  CALCIUM 8.4*  --  7.8*  MG  --  1.4*  --    Liver Function Tests: Recent Labs  Lab 04/10/18 0939  AST 22  ALT 13  ALKPHOS 56  BILITOT 0.7  PROT 5.2*  ALBUMIN 2.6*   CBC: Recent Labs  Lab 04/08/18 1919 04/10/18 0939  WBC 10.3 5.8  HGB 10.4* 9.2*  HCT 31.8* 28.6*  MCV 92.2 96.3  PLT 442* 343   CBG: Recent Labs  Lab 04/11/18 1150 04/11/18 1617 04/11/18 2129 04/12/18 0725 04/12/18 1128  GLUCAP 83 99 100* 101* 103*    Principal Problem:   Streptococcus pneumoniae pneumonia ([facility]) Active Problems:   Essential hypertension, benign   Diabetes ([facility])   IBS (irritable bowel syndrome)   GERD (gastroesophageal reflux disease)   Lobar pneumonia ([facility])   Prolonged QT interval   Time coordinating discharge: 35 minutes  Signed:  Murray Hodgkins, MD  [facility]  04/12/2018, 6:04 PM

## 2018-05-07 ENCOUNTER — Encounter: Payer: Self-pay | Admitting: Cardiology

## 2018-05-07 NOTE — Progress Notes (Signed)
Cardiology Office Note   Date:  05/09/2018   ID:  Espinoza, Heather February 04, 1946, MRN [id number]  PCP:  London Pepper, MD  Cardiologist:   No primary care provider on file. Referring:  London Pepper, MD  Chief Complaint  Patient presents with  . Tachycardia      History of Present Illness: Heather Espinoza is a 73 y.o. female who is referred by London Pepper, MD for evaluation of tachycardia and prolonged QT.  The patient has had mention of a prolonged QT in the past.  I see one EKG with this but for the most part her QT is within normal limits in the records I review.  It is normal today.  She has been told that she has some skipped heartbeats every fourth beat.  However, she says she does not feel any tachypalpitations.  She does not have any chest pressure, neck or arm discomfort.  She does not have any PND or orthopnea.  She is short of breath with any activities.  She says she has a history of COPD.  She chronically sleeps on 5 pillows.  She is here with her husband who says she is very sedentary.  She is not doing much in the way of activity.  She reports having had a syncopal episode about 6 months ago.  Her husband says she had not been eating.  She had another syncopal episode in January.  She was hospitalized in [place].  She was apparently septic with pneumonia.  I do not have these records.  I do have a follow-up hospital report which demonstrated that she had previously been hospitalized for the sepsis.  She was later and, on with weakness and shortness of breath.  She reports that during her first hospitalization there was an echo but I do not have a report of this.  In addition there was mention of an event monitor but I do not have this.  She does report that her lisinopril was increased sometime around January because her blood pressure was elevated.  She denies any cough fevers or chills.  She has had no new edema.  She is actually not been eating and has lost 21 pounds.  She  does not recall any past cardiac history although she saw a cardiologist several years ago and I do not have any records of this.  Past Medical History:  Diagnosis Date  . Arthritis   . COPD (chronic obstructive pulmonary disease) ([place])   . Diabetes ([place])   . Diverticulitis   . Diverticulosis   . Fibromyalgia   . High cholesterol   . Hypertension   . Hypothyroid   . IBS (irritable bowel syndrome)   . Kidney stone    Left Kidney  . Osteopenia   . Restless leg   . Sleep apnea    Doesn't use CPAP.    Marland Kitchen Tachycardia    per pt/fim  . Tremor, essential 01/07/2018    Past Surgical History:  Procedure Laterality Date  . APPENDECTOMY    . BIOPSY  12/25/2016   Procedure: BIOPSY;  Surgeon: Rogene Houston, MD;  [facility];  Service: Endoscopy;;  gastric   . BREAST LUMPECTOMY Right 2001  . CATARACT EXTRACTION Bilateral 2016  . CHOLECYSTECTOMY    . COLONOSCOPY N/A 12/17/2012   Procedure: COLONOSCOPY;  Surgeon: Rogene Houston, MD;  [facility];  Service: Endoscopy;  Laterality: N/A;  215  . complete hysterectomy    .  ESOPHAGEAL DILATION N/A 12/25/2016   Procedure: ESOPHAGEAL DILATION;  Surgeon: Rogene Houston, MD;  [facility];  Service: Endoscopy;  Laterality: N/A;  . ESOPHAGOGASTRODUODENOSCOPY N/A 12/25/2016   Procedure: ESOPHAGOGASTRODUODENOSCOPY (EGD);  Surgeon: Rogene Houston, MD;  [facility];  Service: Endoscopy;  Laterality: N/A;  730  . Heel tumor removed    . rt elbow surgery    . TONSILLECTOMY       Current Outpatient Medications  Medication Sig Dispense Refill  . acetaminophen (TYLENOL) 500 MG tablet Take 500 mg by mouth every 6 (six) hours as needed for pain.    Marland Kitchen albuterol (PROVENTIL HFA;VENTOLIN HFA) 108 (90 Base) MCG/ACT inhaler Inhale 2 puffs into the lungs every 6 (six) hours as needed for wheezing or shortness of breath.    Marland Kitchen amLODipine (NORVASC) 5 MG tablet Take 5 mg by mouth daily.    Marland Kitchen aspirin EC 81 MG  tablet Take 1 tablet (81 mg total) by mouth daily.    . Biotin 5000 MCG CAPS Take 1 capsule by mouth daily.     . bisacodyl (DULCOLAX) 10 MG suppository Place 1 suppository (10 mg total) rectally as needed for moderate constipation. 12 suppository 0  . buPROPion (WELLBUTRIN XL) 150 MG 24 hr tablet Take 1 tablet by mouth daily.    . cholecalciferol (VITAMIN D) 1000 units tablet Take 1,000 Units by mouth 2 (two) times daily.    Marland Kitchen dicyclomine (BENTYL) 10 MG capsule Take 1 capsule (10 mg total) by mouth 2 (two) times daily as needed for spasms. 60 capsule 2  . diphenhydrAMINE (BENADRYL) 25 mg capsule Take 25 mg by mouth every 6 (six) hours as needed (for itchy rash.).    Marland Kitchen docusate sodium (COLACE) 100 MG capsule Take 2 capsules (200 mg total) by mouth at bedtime. (Patient taking differently: Take 200 mg by mouth daily as needed (for constipation.). ) 10 capsule 0  . DULoxetine (CYMBALTA) 60 MG capsule Take 1 capsule (60 mg total) by mouth 2 (two) times daily. 180 capsule 3  . esomeprazole (NEXIUM) 20 MG capsule Take 20 mg by mouth 2 (two) times daily before a meal.    . Fluticasone-Salmeterol (ADVAIR) 250-50 MCG/DOSE AEPB Inhale 1 puff into the lungs every 12 (twelve) hours.    . gabapentin (NEURONTIN) 800 MG tablet Take 400 mg by mouth 2 (two) times daily.     . hydrocortisone cream 0.5 % Apply 1 application topically 2 (two) times daily as needed for itching.    . levothyroxine (SYNTHROID, LEVOTHROID) 75 MCG tablet Take 75 mcg by mouth as directed. Take 1/2 tablet daily    . lidocaine (LIDOCAINE PAK) 5 % ointment Apply 1 application topically 2 (two) times daily as needed for mild pain.     Marland Kitchen lisinopril (PRINIVIL,ZESTRIL) 10 MG tablet Take 10 mg by mouth daily.     Marland Kitchen lubiprostone (AMITIZA) 8 MCG capsule Take 2 capsules (16 mcg total) by mouth daily with breakfast. (Patient taking differently: Take 16 mcg by mouth daily as needed (for constipation.). ) 48 capsule 0  . metformin (FORTAMET) 500 MG (OSM)  24 hr tablet Take 500 mg by mouth daily with breakfast.    . metoprolol succinate (TOPROL-XL) 25 MG 24 hr tablet Take 12.5 mg by mouth at bedtime.    . ondansetron (ZOFRAN) 4 MG tablet TAKE 1 TABLET BY MOUTH TWICE DAILY AS NEEDED FOR NAUSEA AND VOMITING (Patient taking differently: Take 4 mg by mouth every 8 (eight) hours  as needed for nausea or vomiting. ) 30 tablet 0  . propranolol (INDERAL) 20 MG tablet Take 1 tablet (20 mg total) by mouth 2 (two) times daily. 60 tablet 3  . simvastatin (ZOCOR) 20 MG tablet Take 20 mg by mouth every evening.    . traMADol (ULTRAM) 50 MG tablet Take 50 mg by mouth every 6 (six) hours as needed (for pain.).     Marland Kitchen traZODone (DESYREL) 100 MG tablet Take 100 mg by mouth at bedtime.     . vitamin E 400 UNIT capsule Take 400 Units by mouth daily.     No current facility-administered medications for this visit.     Allergies:   Flagyl [metronidazole]; Adhesive [tape]; Aspirin; Lyrica [pregabalin]; Latex; Phenergan [promethazine hcl]; Prednisone; Sulfa antibiotics; and Tetanus toxoids    Social History:  The patient  reports that she has never smoked. She has never used smokeless tobacco. She reports that she does not drink alcohol or use drugs.   Family History:  The patient's family history includes Asthma in her son; COPD in her sister; Colon cancer in her brother; Diabetes in her grandchild; Lung cancer in her brother; Parkinson's disease in her father; Stroke in her father; Uterine cancer in her mother.    ROS:  Please see the history of present illness.   Otherwise, review of systems are positive for none.   All other systems are reviewed and negative.    PHYSICAL EXAM: VS:  BP 100/62   Pulse 83   Ht 5' 1.5" (1.562 m)   Wt 125 lb (56.7 kg)   SpO2 99%   BMI 23.24 kg/m  , BMI Body mass index is 23.24 kg/m. GENERAL:  Well appearing HEENT:  Pupils equal round and reactive, fundi not visualized, oral mucosa unremarkable NECK:  No jugular venous  distention, waveform within normal limits, carotid upstroke brisk and symmetric, no bruits, no thyromegaly LYMPHATICS:  No cervical, inguinal adenopathy LUNGS:  Clear to auscultation bilaterally BACK:  No CVA tenderness CHEST:  Unremarkable HEART:  PMI not displaced or sustained,S1 and S2 within normal limits, no S3, no S4, no clicks, no rubs, no murmurs ABD:  Flat, positive bowel sounds normal in frequency in pitch, no bruits, no rebound, no guarding, no midline pulsatile mass, no hepatomegaly, no splenomegaly EXT:  2 plus pulses throughout, no edema, no cyanosis no clubbing SKIN:  No rashes no nodules NEURO:  Cranial nerves II through XII grossly intact, motor grossly intact throughout PSYCH:  Cognitively intact, oriented to person place and time    EKG:  EKG is ordered today. The ekg ordered today demonstrates sinus rhythm, rate 75, axis within normal limits, intervals within normal limits, no acute ST-T wave changes.   Recent Labs: 04/09/2018: Magnesium 1.4 04/10/2018: ALT 13; BUN <5; Creatinine, Ser 1.03; Hemoglobin 9.2; Platelets 343; Potassium 4.0; Sodium 143    Lipid Panel No results found for: CHOL, TRIG, HDL, CHOLHDL, VLDL, LDLCALC, LDLDIRECT    Wt Readings from Last 3 Encounters:  05/09/18 125 lb (56.7 kg)  04/09/18 138 lb 7.2 oz (62.8 kg)  01/07/18 138 lb 8 oz (62.8 kg)      Other studies Reviewed: Additional studies/ records that were reviewed today include: Extensive office and hospital records as described above.   (Greater than 60 minutes reviewing all data with greater than 50% face to face with the patient). Review of the above records demonstrates:  Please see elsewhere in the note.    ASSESSMENT AND PLAN:  TACHYCARDIA:  Her tachycardia may be related to her low blood pressures.  At this point I am not suspecting that she needs an arrhythmia monitor although I will reassess this.  I will check a TSH.  Further management will be based on response to the therapy  below.  PROLONGED QT: This is been transient.  No further work-up is indicated.  SYNCOPE: She has had 2 episodes.  One was possibly related to the pneumonia which was subsequently diagnosed.  The other after she had not been eating for several days.  Now she has low blood pressures.  And cut back on her lisinopril to 10 mg daily.  She was not orthostatic in the office but she did have a low blood pressure.  Her husband start to keep a diary.  Further evaluation will be based on any future symptoms.  RISK REDUCTION: I will check a lipid profile and liver enzymes as this has not been checked in a while and she is on a statin.   Current medicines are reviewed at length with the patient today.  The patient does not have concerns regarding medicines.  The following changes have been made:  As above  Labs/ tests ordered today include:   Orders Placed This Encounter  Procedures  . TSH  . Lipid panel  . Hepatic function panel  . EKG 12-Lead     Disposition:   FU with me in one month.     Signed, Minus Breeding, MD  05/09/2018 6:04 PM    [place]

## 2018-05-09 ENCOUNTER — Other Ambulatory Visit: Payer: Self-pay

## 2018-05-09 ENCOUNTER — Encounter: Payer: Self-pay | Admitting: Cardiology

## 2018-05-09 ENCOUNTER — Ambulatory Visit (INDEPENDENT_AMBULATORY_CARE_PROVIDER_SITE_OTHER): Payer: Medicare Other | Admitting: Cardiology

## 2018-05-09 VITALS — BP 100/62 | HR 83 | Ht 61.5 in | Wt 125.0 lb

## 2018-05-09 DIAGNOSIS — E7800 Pure hypercholesterolemia, unspecified: Secondary | ICD-10-CM | POA: Diagnosis not present

## 2018-05-09 DIAGNOSIS — Z79899 Other long term (current) drug therapy: Secondary | ICD-10-CM

## 2018-05-09 DIAGNOSIS — E46 Unspecified protein-calorie malnutrition: Secondary | ICD-10-CM | POA: Diagnosis not present

## 2018-05-09 DIAGNOSIS — R9431 Abnormal electrocardiogram [ECG] [EKG]: Secondary | ICD-10-CM

## 2018-05-09 DIAGNOSIS — G4700 Insomnia, unspecified: Secondary | ICD-10-CM | POA: Diagnosis not present

## 2018-05-09 DIAGNOSIS — I10 Essential (primary) hypertension: Secondary | ICD-10-CM

## 2018-05-09 NOTE — Patient Instructions (Signed)
Medication Instructions:  DECREASE- Lisinopril 10 mg daily  If you need a refill on your cardiac medications before your next appointment, please call your pharmacy.  Labwork: TSH, Fasting Lipid and Liver HERE IN OUR OFFICE AT [facility]  You will need to fast. DO NOT EAT OR DRINK PAST MIDNIGHT.       Take the provided lab slips with you to the lab for your blood draw.   When you have your labs (blood work) drawn today and your tests are completely normal, you will receive your results only by MyChart Message (if you have MyChart) -OR-  A paper copy in the mail.  If you have any lab test that is abnormal or we need to change your treatment, we will call you to review these results.  Testing/Procedures: None Ordered   Follow-Up: . Your physician recommends that you schedule a follow-up appointment in: 1 Month with Dr Percival Spanish   At [facility], you and your health needs are our priority.  As part of our continuing mission to provide you with exceptional heart care, we have created designated Provider Care Teams.  These Care Teams include your primary Cardiologist (physician) and Advanced Practice Providers (APPs -  Physician Assistants and Nurse Practitioners) who all work together to provide you with the care you need, when you need it.  Thank you for choosing [facility] at [facility]!!

## 2018-05-10 LAB — LIPID PANEL
Chol/HDL Ratio: 2.4 ratio (ref 0.0–4.4)
Cholesterol, Total: 129 mg/dL (ref 100–199)
HDL: 53 mg/dL (ref >39)
LDL Calculated: 49 mg/dL (ref 0–99)
Triglycerides: 134 mg/dL (ref 0–149)
VLDL Cholesterol Cal: 27 mg/dL (ref 5–40)

## 2018-05-10 LAB — TSH: TSH: 1.34 u[IU]/mL (ref 0.450–4.500)

## 2018-05-10 LAB — HEPATIC FUNCTION PANEL
ALT: 7 IU/L (ref 0–32)
AST: 14 IU/L (ref 0–40)
Albumin: 4.2 g/dL (ref 3.8–4.8)
Alkaline Phosphatase: 54 IU/L (ref 39–117)
Bilirubin Total: 0.3 mg/dL (ref 0.0–1.2)
Bilirubin, Direct: 0.09 mg/dL (ref 0.00–0.40)
Total Protein: 6.6 g/dL (ref 6.0–8.5)

## 2018-05-16 ENCOUNTER — Telehealth: Payer: Self-pay

## 2018-05-16 MED ORDER — AMLODIPINE BESYLATE 5 MG PO TABS: 5.0000 mg | ORAL_TABLET | Freq: Every day | ORAL | 6 refills | Status: DC

## 2018-05-16 NOTE — Telephone Encounter (Signed)
Spoke to patient she wanted to ask Dr.Hochrein if she needs to continue taking Amlodipine 5 mg daily.Stated her B/P has been normal since Lisinopril decreased to 10 mg daily. Advised I will send message to him.

## 2018-05-18 NOTE — Telephone Encounter (Signed)
Reduce the Norvasc to 5 mg daily and continue to check the BPs

## 2018-05-20 NOTE — Telephone Encounter (Signed)
Sorry.  Reduce to 2.5 mg po daily

## 2018-05-21 NOTE — Telephone Encounter (Signed)
Call and give pt Dr Percival Spanish recommendation, advised pt to keep check of her BP and bring it at next [place] 03/26.

## 2018-05-26 DIAGNOSIS — D649 Anemia, unspecified: Secondary | ICD-10-CM | POA: Diagnosis not present

## 2018-06-04 ENCOUNTER — Encounter: Payer: Self-pay | Admitting: Cardiology

## 2018-06-04 ENCOUNTER — Other Ambulatory Visit: Payer: Self-pay | Admitting: Neurology

## 2018-06-11 ENCOUNTER — Telehealth: Payer: Self-pay | Admitting: Nurse Practitioner

## 2018-06-11 NOTE — Telephone Encounter (Signed)
   Primary Cardiologist:   Hochrein  Patient contacted.  History reviewed.  No symptoms to suggest any unstable cardiac conditions.  Based on discussion, with current pandemic situation, we will be postponing this appointment for Heather Espinoza with a plan for f/u in 6 wks or sooner if feasible/necessary.  If symptoms change, she has been instructed to contact our office.   Priority 2.   Routing to [employer] to send to pool.     Truitt Merle, NP  06/11/2018 8:08 AM         .

## 2018-06-12 ENCOUNTER — Ambulatory Visit: Payer: Medicare Other | Admitting: Cardiology

## 2018-06-25 ENCOUNTER — Other Ambulatory Visit: Payer: Self-pay | Admitting: Neurology

## 2018-06-30 NOTE — Progress Notes (Signed)
Virtual Visit via Video Note   This visit type was conducted due to national recommendations for restrictions regarding the COVID-19 Pandemic (e.g. social distancing) in an effort to limit this patient's exposure and mitigate transmission in our community.  Due to her co-morbid illnesses, this patient is at least at moderate risk for complications without adequate follow up.  This format is felt to be most appropriate for this patient at this time.  All issues noted in this document were discussed and addressed.  A limited physical exam was performed with this format.  Please refer to the patient's chart for her consent to telehealth for [facility].   Evaluation Performed:  Follow-up visit  Date:  07/01/2018   ID:  Heather Espinoza, Heather Espinoza [birth year], MRN [id number]  Patient Location: Home  Provider Location: Home  PCP:  London Pepper, MD  Cardiologist:  Minus Breeding, MD  Electrophysiologist:  None    Chief Complaint:  Dizziness  History of Present Illness:    Heather Espinoza is a 73 y.o. female who presents via audio/video conferencing for a telehealth visit today.    She was referred  by London Pepper, MD for evaluation of tachycardia and prolonged QT in Feb.  The patient has had mention of a prolonged QT in the past.  At the last visit I did see one EKG with this but for the most part her QT is within normal limits in the records I reviewed.  At the last visit I checked a TSH because of tachycardia.  This was normal.  She also had had two episodes of syncope and at the last visit I reduced her lisinopril.      Since I last saw her she is done a little bit better.  She still gets a little dizzy but she has had no syncope.  Her blood pressure actually has been running much higher than it was typically in the 149F systolic.  Heart rates have been in the 60s.  She has been eating more.  Her weight which was dropping has stabilized.  It sounds like she is getting over recent sepsis and  pneumonia.  She does not notice any palpitations.  Some sharp shooting substernal chest pain but this is been a chronic problem and not changed from previous.  She is not having any neck or arm discomfort.  She gets some shortness of breath with activity such as carrying a laundry but this is chronic.  She is not having any PND or orthopnea.  She is had no edema.  The patient does not have symptoms concerning for COVID-19 infection (fever, chills, cough, or new shortness of breath).    Past Medical History:  Diagnosis Date  . Arthritis   . COPD (chronic obstructive pulmonary disease) ([place])   . Diabetes ([place])   . Diverticulitis   . Diverticulosis   . Fibromyalgia   . High cholesterol   . Hypertension   . Hypothyroid   . IBS (irritable bowel syndrome)   . Kidney stone    Left Kidney  . Osteopenia   . Restless leg   . Sleep apnea    Doesn't use CPAP.    Marland Kitchen Tachycardia    per pt/fim  . Tremor, essential 01/07/2018   Past Surgical History:  Procedure Laterality Date  . APPENDECTOMY    . BIOPSY  12/25/2016   Procedure: BIOPSY;  Surgeon: Rogene Houston, MD;  [facility];  Service: Endoscopy;;  gastric   .  BREAST LUMPECTOMY Right 2001  . CATARACT EXTRACTION Bilateral 2016  . CHOLECYSTECTOMY    . COLONOSCOPY N/A 12/17/2012   Procedure: COLONOSCOPY;  Surgeon: Rogene Houston, MD;  [facility];  Service: Endoscopy;  Laterality: N/A;  215  . complete hysterectomy    . ESOPHAGEAL DILATION N/A 12/25/2016   Procedure: ESOPHAGEAL DILATION;  Surgeon: Rogene Houston, MD;  [facility];  Service: Endoscopy;  Laterality: N/A;  . ESOPHAGOGASTRODUODENOSCOPY N/A 12/25/2016   Procedure: ESOPHAGOGASTRODUODENOSCOPY (EGD);  Surgeon: Rogene Houston, MD;  [facility];  Service: Endoscopy;  Laterality: N/A;  730  . Heel tumor removed    . rt elbow surgery    . TONSILLECTOMY       Current Meds  Medication Sig  . acetaminophen (TYLENOL) 500 MG tablet  Take 500 mg by mouth every 6 (six) hours as needed for pain.  Marland Kitchen albuterol (PROVENTIL HFA;VENTOLIN HFA) 108 (90 Base) MCG/ACT inhaler Inhale 2 puffs into the lungs every 6 (six) hours as needed for wheezing or shortness of breath.  Marland Kitchen amLODipine (NORVASC) 5 MG tablet Take 1 tablet (5 mg total) by mouth daily.  Marland Kitchen aspirin EC 81 MG tablet Take 1 tablet (81 mg total) by mouth daily.  . Biotin 5000 MCG CAPS Take 1 capsule by mouth daily.   . bisacodyl (DULCOLAX) 10 MG suppository Place 1 suppository (10 mg total) rectally as needed for moderate constipation.  Marland Kitchen buPROPion (WELLBUTRIN XL) 150 MG 24 hr tablet Take 1 tablet by mouth daily.  . cholecalciferol (VITAMIN D) 1000 units tablet Take 1,000 Units by mouth 2 (two) times daily.  Marland Kitchen dicyclomine (BENTYL) 10 MG capsule Take 1 capsule (10 mg total) by mouth 2 (two) times daily as needed for spasms.  . diphenhydrAMINE (BENADRYL) 25 mg capsule Take 25 mg by mouth every 6 (six) hours as needed (for itchy rash.).  Marland Kitchen docusate sodium (COLACE) 100 MG capsule Take 2 capsules (200 mg total) by mouth at bedtime. (Patient taking differently: Take 200 mg by mouth daily as needed (for constipation.). )  . DULoxetine (CYMBALTA) 60 MG capsule Take 1 capsule by mouth twice daily  . esomeprazole (NEXIUM) 20 MG capsule Take 20 mg by mouth 2 (two) times daily before a meal.  . Fluticasone-Salmeterol (ADVAIR) 250-50 MCG/DOSE AEPB Inhale 1 puff into the lungs every 12 (twelve) hours.  . gabapentin (NEURONTIN) 400 MG capsule Take 1 capsule by mouth 2 (two) times daily.  . hydrocortisone cream 0.5 % Apply 1 application topically 2 (two) times daily as needed for itching.  . levothyroxine (SYNTHROID, LEVOTHROID) 75 MCG tablet Take 75 mcg by mouth as directed. Take 1/2 tablet daily  . lidocaine (LIDOCAINE PAK) 5 % ointment Apply 1 application topically 2 (two) times daily as needed for mild pain.   Marland Kitchen lisinopril (PRINIVIL,ZESTRIL) 10 MG tablet Take 10 mg by mouth daily.   Marland Kitchen  lubiprostone (AMITIZA) 8 MCG capsule Take 2 capsules (16 mcg total) by mouth daily with breakfast. (Patient taking differently: Take 16 mcg by mouth daily as needed (for constipation.). )  . metformin (FORTAMET) 500 MG (OSM) 24 hr tablet Take 500 mg by mouth daily with breakfast.  . metoprolol succinate (TOPROL-XL) 25 MG 24 hr tablet Take 12.5 mg by mouth at bedtime.  . mirtazapine (REMERON) 30 MG tablet Take 1 tablet by mouth at bedtime.  . ondansetron (ZOFRAN) 4 MG tablet TAKE 1 TABLET BY MOUTH TWICE DAILY AS NEEDED FOR NAUSEA AND VOMITING (Patient taking  differently: Take 4 mg by mouth every 8 (eight) hours as needed for nausea or vomiting. )  . propranolol (INDERAL) 20 MG tablet Take 1 tablet by mouth twice daily  . simvastatin (ZOCOR) 20 MG tablet Take 20 mg by mouth every evening.  . traMADol (ULTRAM) 50 MG tablet Take 50 mg by mouth every 6 (six) hours as needed (for pain.).   Marland Kitchen traZODone (DESYREL) 100 MG tablet Take 100 mg by mouth at bedtime.   . vitamin E 400 UNIT capsule Take 400 Units by mouth daily.     Allergies:   Flagyl [metronidazole]; Adhesive [tape]; Aspirin; Lyrica [pregabalin]; Latex; Phenergan [promethazine hcl]; Prednisone; Sulfa antibiotics; and Tetanus toxoids   Social History   Tobacco Use  . Smoking status: Never Smoker  . Smokeless tobacco: Never Used  Substance Use Topics  . Alcohol use: No    Alcohol/week: 0.0 standard drinks  . Drug use: No     Family Hx: The patient's family history includes Asthma in her son; COPD in her sister; Colon cancer in her brother; Diabetes in her grandchild; Lung cancer in her brother; Parkinson's disease in her father; Stroke in her father; Uterine cancer in her mother.  ROS:   Please see the history of present illness.    Positive for night sweats.  All other systems reviewed and are negative.   Prior CV studies:   The following studies were reviewed today:  Labs  Labs/Other Tests and Data Reviewed:    EKG:  No ECG  reviewed.  Recent Labs: 04/09/2018: Magnesium 1.4 04/10/2018: BUN <5; Creatinine, Ser 1.03; Hemoglobin 9.2; Platelets 343; Potassium 4.0; Sodium 143 05/09/2018: ALT 7; TSH 1.340   Recent Lipid Panel Lab Results  Component Value Date/Time   CHOL 129 05/09/2018 03:11 PM   TRIG 134 05/09/2018 03:11 PM   HDL 53 05/09/2018 03:11 PM   CHOLHDL 2.4 05/09/2018 03:11 PM   LDLCALC 49 05/09/2018 03:11 PM    Wt Readings from Last 3 Encounters:  07/01/18 124 lb (56.2 kg)  05/09/18 125 lb (56.7 kg)  04/09/18 138 lb 7.2 oz (62.8 kg)     Objective:    Vital Signs:  BP (!) 160/87   Pulse 68   Ht 5' 1.5" (1.562 m)   Wt 124 lb (56.2 kg)   BMI 23.05 kg/m    Well nourished, well developed female in no acute distress.   ASSESSMENT & PLAN:     TACHYCARDIA:    Her heart rate has come down.  Her TSH was normal.  No change in therapy since this is no longer an issue.   PROLONGED QT:  This have been transient.  No change in therapy.  SYNCOPE:  She has had no further episodes.  This may have been related to her acute illness.  No change in therapy.  RISK REDUCTION: Lipids were excellent at the last visit.  No change in therapy.   HTN:  BP has really gone up since decreasing the lisinopril.  I will increase Norvasc to 7.5 mg daily.  She will keep a BP diary.    COVID-19 Education: The signs and symptoms of COVID-19 were discussed with the patient and how to seek care for testing (follow up with PCP or arrange E-visit).  She is staying in for the most part.  The importance of social distancing was discussed today.  Time:   Today, I have spent 20  minutes with the patient with telehealth technology discussing the above problems.  Medication Adjustments/Labs and Tests Ordered: Current medicines are reviewed at length with the patient today.  Concerns regarding medicines are outlined above.   Tests Ordered: No orders of the defined types were placed in this encounter.   Medication  Changes: No orders of the defined types were placed in this encounter.   Disposition:  Follow up in 6 month(s)  Signed, Minus Breeding, MD  07/01/2018 10:15 AM    [place]

## 2018-07-01 ENCOUNTER — Encounter: Payer: Self-pay | Admitting: Cardiology

## 2018-07-01 ENCOUNTER — Telehealth (INDEPENDENT_AMBULATORY_CARE_PROVIDER_SITE_OTHER): Payer: Medicare Other | Admitting: Cardiology

## 2018-07-01 VITALS — BP 160/87 | HR 68 | Ht 61.5 in | Wt 124.0 lb

## 2018-07-01 DIAGNOSIS — R0602 Shortness of breath: Secondary | ICD-10-CM | POA: Diagnosis not present

## 2018-07-01 DIAGNOSIS — I10 Essential (primary) hypertension: Secondary | ICD-10-CM

## 2018-07-01 DIAGNOSIS — R9431 Abnormal electrocardiogram [ECG] [EKG]: Secondary | ICD-10-CM | POA: Diagnosis not present

## 2018-07-01 DIAGNOSIS — R002 Palpitations: Secondary | ICD-10-CM | POA: Diagnosis not present

## 2018-07-01 DIAGNOSIS — Z7189 Other specified counseling: Secondary | ICD-10-CM | POA: Insufficient documentation

## 2018-07-01 MED ORDER — AMLODIPINE BESYLATE 5 MG PO TABS: 7.5000 mg | ORAL_TABLET | Freq: Every day | ORAL | 3 refills | Status: DC

## 2018-07-01 NOTE — Patient Instructions (Signed)
Medication Instructions:  INCREASE- Amlodipine 7.5 mg(1 1/2 tablets) daily  If you need a refill on your cardiac medications before your next appointment, please call your pharmacy.  Labwork: None Ordered   Testing/Procedures: None Ordered   Follow-Up: You will need a follow up appointment in 6 months.  Please call our office 2 months in advance to schedule this appointment.  You may see Minus Breeding, MD or one of the following Advanced Practice Providers on your designated Care Team:   Rosaria Ferries, PA-C . Jory Sims, DNP, ANP     At [facility], you and your health needs are our priority.  As part of our continuing mission to provide you with exceptional heart care, we have created designated Provider Care Teams.  These Care Teams include your primary Cardiologist (physician) and Advanced Practice Providers (APPs -  Physician Assistants and Nurse Practitioners) who all work together to provide you with the care you need, when you need it.  Thank you for choosing [facility]!!

## 2018-07-15 ENCOUNTER — Other Ambulatory Visit: Payer: Self-pay

## 2018-07-15 ENCOUNTER — Ambulatory Visit (INDEPENDENT_AMBULATORY_CARE_PROVIDER_SITE_OTHER): Payer: Medicare Other | Admitting: Internal Medicine

## 2018-07-15 ENCOUNTER — Encounter (INDEPENDENT_AMBULATORY_CARE_PROVIDER_SITE_OTHER): Payer: Self-pay | Admitting: Internal Medicine

## 2018-07-15 VITALS — Ht 61.5 in | Wt 139.0 lb

## 2018-07-15 DIAGNOSIS — R110 Nausea: Secondary | ICD-10-CM | POA: Diagnosis not present

## 2018-07-15 DIAGNOSIS — K219 Gastro-esophageal reflux disease without esophagitis: Secondary | ICD-10-CM | POA: Diagnosis not present

## 2018-07-15 DIAGNOSIS — K582 Mixed irritable bowel syndrome: Secondary | ICD-10-CM | POA: Diagnosis not present

## 2018-07-15 MED ORDER — ESOMEPRAZOLE MAGNESIUM 40 MG PO CPDR: 40.0000 mg | DELAYED_RELEASE_CAPSULE | Freq: Every day | ORAL | 5 refills | Status: DC

## 2018-07-15 MED ORDER — FAMOTIDINE 40 MG PO TABS: 40.0000 mg | ORAL_TABLET | Freq: Every day | ORAL | 2 refills | Status: DC

## 2018-07-15 MED ORDER — ONDANSETRON HCL 4 MG PO TABS: 4.0000 mg | ORAL_TABLET | Freq: Two times a day (BID) | ORAL | 1 refills | Status: DC | PRN

## 2018-07-15 NOTE — Patient Instructions (Signed)
Patient will call with progress report in 2 weeks.

## 2018-07-15 NOTE — Progress Notes (Addendum)
Virtual Visit via Telephone Note Patient had scheduled visit for today.  Visit was canceled because of ongoing COVID-19 pandemic.  Patient requested to proceed with tele-visit and I agreed. I connected with Heather Espinoza on 07/15/18 at  2:45 PM EDT by telephone and verified that I am speaking with the correct person using two identifiers.   I discussed the limitations, risks, security and privacy concerns of performing an evaluation and management service by telephone and the availability of in person appointments. I also discussed with the patient that there may be a patient responsible charge related to this service. The patient expressed understanding and agreed to proceed.   History of Present Illness:  Patient is 73 year old Caucasian female who has chronic GERD IBS both diarrhea and constipation as well as history of nausea who was last seen in March 2019. Patient says she is doing well as her IBS is concerned.  Most days she has 1-2 formed stools per day.  She states she has not used suppository in over a month.  And similarly she has not needed dicyclomine lately.  And she is using Amitiza only on as-needed basis which is not often. However she is complaining of burning in her throat.  However she does not have heartburn often.  She says some days she takes Nexium OTC 3 times a day but it still does not help. She tells me that she was hospitalized for pneumonia in January 2020.  She was initially at [facility] and subsequently transferred to [facility].  She feels she has fully recovered. She denies dysphagia vomiting melena or rectal bleeding. She states she has lost few pounds during acute illness but she has bounced back.  Current medication risks reviewed and noted to be accurate.   Observations/Objective:  Weight 139 pounds.  Assessment and Plan:  #1.  IBS.  She is doing well with dietary measures and requiring dicyclomine and lubiprostone sparingly.  #2.  GERD.  While  heartburn is well controlled she is having throat symptoms most likely due to GERD.  Flareup may have been triggered due to recent illness with bilateral pneumonia.  #3.  Nausea without vomiting.  This may be GI related or centrally.  Follow Up Instructions:  Patient advised to take Esomeprazole 40 mg by mouth 30 minutes before breakfast daily. She will take famotidine 20 mg by mouth daily at bedtime. Ondansetron 4 mg twice daily PRN. Patient will call with progress report in 2 weeks. Office visit in 6 months.   I discussed the assessment and treatment plan with the patient. The patient was provided an opportunity to ask questions and all were answered. The patient agreed with the plan and demonstrated an understanding of the instructions.   The patient was advised to call back or seek an in-person evaluation if the symptoms worsen or if the condition fails to improve as anticipated.  I provided 11 minutes of non-face-to-face time during this encounter.   Hildred Laser, MD

## 2018-07-18 DIAGNOSIS — I10 Essential (primary) hypertension: Secondary | ICD-10-CM | POA: Diagnosis not present

## 2018-07-18 DIAGNOSIS — J449 Chronic obstructive pulmonary disease, unspecified: Secondary | ICD-10-CM | POA: Diagnosis not present

## 2018-07-18 DIAGNOSIS — M2550 Pain in unspecified joint: Secondary | ICD-10-CM | POA: Diagnosis not present

## 2018-07-18 DIAGNOSIS — E039 Hypothyroidism, unspecified: Secondary | ICD-10-CM | POA: Diagnosis not present

## 2018-07-24 ENCOUNTER — Telehealth: Payer: Self-pay

## 2018-07-24 NOTE — Telephone Encounter (Signed)
I contacted the pt and advised Due to current COVID 19 pandemic, our office is severely reducing in office visits until further notice, in order to minimize the risk to our patients and healthcare providers.   Pt was offered a video visit for 07/28/18 and accepted.   Pt understands that although there may be some limitations with this type of visit, we will take all precautions to reduce any security or privacy concerns.  Pt understands that this will be treated like an in office visit and we will file with pt's insurance, and there may be a patient responsible charge related to this service.  Meds, allergies and PMH have been updated

## 2018-07-28 ENCOUNTER — Ambulatory Visit (INDEPENDENT_AMBULATORY_CARE_PROVIDER_SITE_OTHER): Payer: Medicare Other | Admitting: Neurology

## 2018-07-28 ENCOUNTER — Encounter: Payer: Self-pay | Admitting: Neurology

## 2018-07-28 ENCOUNTER — Other Ambulatory Visit: Payer: Self-pay

## 2018-07-28 DIAGNOSIS — G250 Essential tremor: Secondary | ICD-10-CM | POA: Diagnosis not present

## 2018-07-28 DIAGNOSIS — G8929 Other chronic pain: Secondary | ICD-10-CM

## 2018-07-28 DIAGNOSIS — M545 Low back pain: Secondary | ICD-10-CM

## 2018-07-28 MED ORDER — PROPRANOLOL HCL 20 MG PO TABS: 20.0000 mg | ORAL_TABLET | Freq: Two times a day (BID) | ORAL | 2 refills | Status: DC

## 2018-07-28 NOTE — Progress Notes (Signed)
     Virtual Visit via Telephone Note  I connected with [name] on 07/28/18 at 10:30 AM EDT by telephone and verified that I am speaking with the correct person using two identifiers.  Location: Patient: The patient is at home. Provider: The physician is in office.   I discussed the limitations, risks, security and privacy concerns of performing an evaluation and management service by telephone and the availability of in person appointments. I also discussed with the patient that there may be a patient responsible charge related to this service. The patient expressed understanding and agreed to proceed.   History of Present Illness: Heather Espinoza is a 73 year old right-handed white female with a history of chronic low back pain associated with bilateral L4-5 facet joint arthropathy.  The patient has gained benefit previously with facet joint injections transiently, the pain however continues to return.  On her last injection, the radiologist brought up the possibility of a radiofrequency ablation procedure.  The patient was found to have an essential tremor, she was placed on low-dose propranolol, she has done well with this medication taking 20 mg twice daily.  She claims that her blood pressure generally runs around 120/85 and her pulse is around 60.  The patient tolerates the drug well.  She believes that the tremor is under good control.  She has been in the hospital on 08 April 2018 with pneumonia.  She has recovered from this.   Observations/Objective: Telephone evaluation reveals that the patient is alert and cooperative, she is answering questions appropriately.  No tremors in the voice.  No evidence of aphasia or dysarthria.  This visit was set up as a video visit, unable to connect with the patient, she has a satellite wireless system.  Assessment and Plan: 1.  Essential tremor  2.  Chronic low back pain  The patient will be set up for a radiofrequency neurectomy for the  chronic low back pain.  The patient will follow-up here in about 6 months.  A prescription was sent in for the propranolol.  Follow Up Instructions: 77-month follow-up, may see nurse practitioner.   I discussed the assessment and treatment plan with the patient. The patient was provided an opportunity to ask questions and all were answered. The patient agreed with the plan and demonstrated an understanding of the instructions.   The patient was advised to call back or seek an in-person evaluation if the symptoms worsen or if the condition fails to improve as anticipated.  I provided 15 minutes of non-face-to-face time during this encounter.   Kathrynn Ducking, MD

## 2018-08-12 ENCOUNTER — Other Ambulatory Visit: Payer: Self-pay | Admitting: Neurology

## 2018-08-12 DIAGNOSIS — M545 Low back pain, unspecified: Secondary | ICD-10-CM

## 2018-08-12 DIAGNOSIS — G8929 Other chronic pain: Secondary | ICD-10-CM

## 2018-09-04 ENCOUNTER — Other Ambulatory Visit: Payer: Self-pay | Admitting: Neurology

## 2018-09-04 ENCOUNTER — Ambulatory Visit
Admission: RE | Admit: 2018-09-04 | Discharge: 2018-09-04 | Disposition: A | Discharge status: Home / Self Care | Payer: Medicare Other | Source: Ambulatory Visit | Attending: Neurology | Admitting: Neurology

## 2018-09-04 DIAGNOSIS — M545 Low back pain, unspecified: Secondary | ICD-10-CM

## 2018-09-04 DIAGNOSIS — G8929 Other chronic pain: Secondary | ICD-10-CM

## 2018-09-04 MED ORDER — IOPAMIDOL (ISOVUE-M 200) INJECTION 41%: 1.0000 mL | Freq: Once | INTRAMUSCULAR | Status: DC

## 2018-09-08 ENCOUNTER — Other Ambulatory Visit: Payer: Self-pay | Admitting: Neurology

## 2018-09-08 DIAGNOSIS — G8929 Other chronic pain: Secondary | ICD-10-CM

## 2018-09-09 ENCOUNTER — Ambulatory Visit
Admission: RE | Admit: 2018-09-09 | Discharge: 2018-09-09 | Disposition: A | Discharge status: Home / Self Care | Payer: Medicare Other | Source: Ambulatory Visit | Attending: Neurology | Admitting: Neurology

## 2018-09-09 ENCOUNTER — Other Ambulatory Visit: Payer: Self-pay | Admitting: Neurology

## 2018-09-09 DIAGNOSIS — G8929 Other chronic pain: Secondary | ICD-10-CM

## 2018-09-09 DIAGNOSIS — M545 Low back pain, unspecified: Secondary | ICD-10-CM

## 2018-09-09 MED ORDER — SODIUM CHLORIDE 0.9 % IV SOLN
Freq: Once | INTRAVENOUS | Status: AC
  Administered 2018-09-09: 10:00:00 via INTRAVENOUS

## 2018-09-09 MED ORDER — FENTANYL CITRATE (PF) 100 MCG/2ML IJ SOLN
25.0000 ug | INTRAMUSCULAR | Status: DC | PRN
  Administered 2018-09-09 (×2): 25 ug via INTRAVENOUS

## 2018-09-09 MED ORDER — MIDAZOLAM HCL 2 MG/2ML IJ SOLN
1.0000 mg | INTRAMUSCULAR | Status: DC | PRN
  Administered 2018-09-09 (×3): 0.5 mg via INTRAVENOUS

## 2018-09-09 MED ORDER — METHYLPREDNISOLONE ACETATE 40 MG/ML INJ SUSP (RADIOLOG
120.0000 mg | Freq: Once | INTRAMUSCULAR | Status: AC
  Administered 2018-09-09: 120 mg via INTRA_ARTICULAR

## 2018-09-09 MED ORDER — KETOROLAC TROMETHAMINE 30 MG/ML IJ SOLN
30.0000 mg | Freq: Once | INTRAMUSCULAR | Status: AC
  Administered 2018-09-09: 30 mg via INTRAVENOUS

## 2018-09-09 NOTE — Discharge Instructions (Signed)
Radio Frequency Ablation Post Procedure Discharge Instructions ° °1. May resume a regular diet and any medications that you routinely take (including pain medications). °2. No driving day of procedure. °3. Upon discharge go home and rest for at least 4 hours.  May use an ice pack as needed to injection sites on back. °4. Remove bandades later, today. ° ° ° °Please contact our office at [phone] for the following symptoms: ° °· Fever greater than 100 degrees °· Increased swelling, pain, or redness at injection site. ° ° °Thank you for visiting [facility]. °

## 2018-09-09 NOTE — Progress Notes (Signed)
Sedation time for RFA was 46 minutes.

## 2018-09-09 NOTE — Progress Notes (Signed)
This set of vitals obtained at 1025

## 2018-09-22 ENCOUNTER — Telehealth: Payer: Self-pay | Admitting: Neurology

## 2018-09-22 DIAGNOSIS — M5442 Lumbago with sciatica, left side: Secondary | ICD-10-CM

## 2018-09-22 DIAGNOSIS — G8929 Other chronic pain: Secondary | ICD-10-CM

## 2018-09-22 NOTE — Telephone Encounter (Signed)
Pt states she is still having trouble with her back and since it has been a while she would like to know if Dr Jannifer Franklin will submit an order for another MRI of her back to see what is going on.  Please call

## 2018-09-23 NOTE — Telephone Encounter (Signed)
I contacted the pt and advised Dr. Jannifer Franklin is currently out of the office and will return on 09/24/18. Pt advised I would ask about MRI order then. Pt was agreeable.

## 2018-09-24 ENCOUNTER — Telehealth: Payer: Self-pay | Admitting: Neurology

## 2018-09-24 ENCOUNTER — Encounter: Payer: Self-pay | Admitting: Neurology

## 2018-09-24 NOTE — Addendum Note (Signed)
Addended by: Kathrynn Ducking on: 09/24/2018 11:01 AM   Modules accepted: Orders

## 2018-09-24 NOTE — Telephone Encounter (Signed)
Medicare/equitable order sent to GI. No auth they will reach out to the patient to schedule.

## 2018-09-24 NOTE — Telephone Encounter (Signed)
I called the patient.  Unfortunately, since the radiofrequency ablation of the facet joints, her back pain is increased and she has pain down the legs now on both sides with a sensation of weakness in the legs.  The patient has significant pain with weightbearing.  Due to the change in severity and quality of the pain and distribution, I will go ahead and recheck MRI of the lumbar spine.  The patient may require a pain center referral in the future.

## 2018-10-03 ENCOUNTER — Ambulatory Visit (HOSPITAL_COMMUNITY): Payer: Medicare Other

## 2018-10-03 DIAGNOSIS — E039 Hypothyroidism, unspecified: Secondary | ICD-10-CM | POA: Diagnosis not present

## 2018-10-03 DIAGNOSIS — L0292 Furuncle, unspecified: Secondary | ICD-10-CM | POA: Diagnosis not present

## 2018-10-03 DIAGNOSIS — D649 Anemia, unspecified: Secondary | ICD-10-CM | POA: Diagnosis not present

## 2018-10-03 DIAGNOSIS — R635 Abnormal weight gain: Secondary | ICD-10-CM | POA: Diagnosis not present

## 2018-10-03 DIAGNOSIS — Z6829 Body mass index (BMI) 29.0-29.9, adult: Secondary | ICD-10-CM | POA: Diagnosis not present

## 2018-10-05 ENCOUNTER — Other Ambulatory Visit: Payer: Self-pay | Admitting: Neurology

## 2018-10-10 ENCOUNTER — Ambulatory Visit (HOSPITAL_COMMUNITY): Payer: Medicare Other

## 2018-10-15 ENCOUNTER — Other Ambulatory Visit (INDEPENDENT_AMBULATORY_CARE_PROVIDER_SITE_OTHER): Payer: Self-pay | Admitting: Internal Medicine

## 2018-10-20 ENCOUNTER — Ambulatory Visit
Admission: RE | Admit: 2018-10-20 | Discharge: 2018-10-20 | Disposition: A | Discharge status: Home / Self Care | Payer: Medicare Other | Source: Ambulatory Visit | Attending: Neurology | Admitting: Neurology

## 2018-10-20 ENCOUNTER — Other Ambulatory Visit: Payer: Self-pay

## 2018-10-20 ENCOUNTER — Ambulatory Visit (HOSPITAL_COMMUNITY)
Admission: RE | Admit: 2018-10-20 | Discharge: 2018-10-20 | Disposition: A | Discharge status: Home / Self Care | Payer: Medicare Other | Source: Ambulatory Visit | Attending: Internal Medicine | Admitting: Internal Medicine

## 2018-10-20 ENCOUNTER — Telehealth: Payer: Self-pay | Admitting: Neurology

## 2018-10-20 DIAGNOSIS — M5442 Lumbago with sciatica, left side: Secondary | ICD-10-CM | POA: Diagnosis not present

## 2018-10-20 DIAGNOSIS — M5441 Lumbago with sciatica, right side: Secondary | ICD-10-CM

## 2018-10-20 DIAGNOSIS — G8929 Other chronic pain: Secondary | ICD-10-CM

## 2018-10-20 DIAGNOSIS — Z1231 Encounter for screening mammogram for malignant neoplasm of breast: Secondary | ICD-10-CM | POA: Insufficient documentation

## 2018-10-20 NOTE — Telephone Encounter (Signed)
I called the patient.  The MRI does show some potential for compression of the left L5 nerve root, but the patient has significant facet joint arthritis at the L5-S1 level which may be the source of her bilateral back and leg pain.  The ablation therapies have not been helpful, the pain is actually increased.  The patient wishes to have a surgical opinion to see whether fusion at that level may be of some benefit in controlling the pain.  Her husband had surgery through Dr. Earle Gell previously, she would like to see him for a surgical opinion.  I will try to get this set up.    MRI lumbar 10/20/18:  IMPRESSION: This MRI of the lumbar spine without contrast shows the following: 1.  At L3-L4 there is mild spinal stenosis causing mild foraminal narrowing and mild lateral recess stenosis but no nerve root compression 2.  At L4-L5, there is a left paramedian disc protrusion and facet hypertrophy causing mild spinal stenosis, moderate left foraminal narrowing and moderately severe left lateral recess stenosis.  There is potential for left L5 nerve root compression.  This level has progressed compared to the 2017 MRI. 3.  At L5-S1, there is 2 mm anterolisthesis associated with severe facet hypertrophy.  No nerve root compression.

## 2018-10-28 DIAGNOSIS — M4316 Spondylolisthesis, lumbar region: Secondary | ICD-10-CM | POA: Diagnosis not present

## 2018-10-28 DIAGNOSIS — M545 Low back pain: Secondary | ICD-10-CM | POA: Diagnosis not present

## 2018-11-10 ENCOUNTER — Other Ambulatory Visit (INDEPENDENT_AMBULATORY_CARE_PROVIDER_SITE_OTHER): Payer: Self-pay | Admitting: Internal Medicine

## 2018-11-18 DIAGNOSIS — M79672 Pain in left foot: Secondary | ICD-10-CM | POA: Diagnosis not present

## 2018-11-18 DIAGNOSIS — M79671 Pain in right foot: Secondary | ICD-10-CM | POA: Diagnosis not present

## 2018-11-18 DIAGNOSIS — M25579 Pain in unspecified ankle and joints of unspecified foot: Secondary | ICD-10-CM | POA: Diagnosis not present

## 2018-11-19 DIAGNOSIS — H26493 Other secondary cataract, bilateral: Secondary | ICD-10-CM | POA: Diagnosis not present

## 2018-11-26 DIAGNOSIS — M545 Low back pain: Secondary | ICD-10-CM | POA: Diagnosis not present

## 2018-12-02 ENCOUNTER — Other Ambulatory Visit (INDEPENDENT_AMBULATORY_CARE_PROVIDER_SITE_OTHER): Payer: Self-pay | Admitting: Internal Medicine

## 2018-12-02 DIAGNOSIS — K582 Mixed irritable bowel syndrome: Secondary | ICD-10-CM

## 2018-12-07 DIAGNOSIS — Z23 Encounter for immunization: Secondary | ICD-10-CM | POA: Diagnosis not present

## 2018-12-08 DIAGNOSIS — M545 Low back pain: Secondary | ICD-10-CM | POA: Diagnosis not present

## 2018-12-09 DIAGNOSIS — M79671 Pain in right foot: Secondary | ICD-10-CM | POA: Diagnosis not present

## 2018-12-09 DIAGNOSIS — M25579 Pain in unspecified ankle and joints of unspecified foot: Secondary | ICD-10-CM | POA: Diagnosis not present

## 2018-12-09 DIAGNOSIS — M79672 Pain in left foot: Secondary | ICD-10-CM | POA: Diagnosis not present

## 2018-12-11 DIAGNOSIS — M545 Low back pain: Secondary | ICD-10-CM | POA: Diagnosis not present

## 2018-12-15 DIAGNOSIS — M79672 Pain in left foot: Secondary | ICD-10-CM | POA: Diagnosis not present

## 2018-12-15 DIAGNOSIS — L600 Ingrowing nail: Secondary | ICD-10-CM | POA: Diagnosis not present

## 2018-12-15 DIAGNOSIS — M79671 Pain in right foot: Secondary | ICD-10-CM | POA: Diagnosis not present

## 2018-12-15 DIAGNOSIS — E1151 Type 2 diabetes mellitus with diabetic peripheral angiopathy without gangrene: Secondary | ICD-10-CM | POA: Diagnosis not present

## 2018-12-15 DIAGNOSIS — M25579 Pain in unspecified ankle and joints of unspecified foot: Secondary | ICD-10-CM | POA: Diagnosis not present

## 2018-12-15 DIAGNOSIS — E1140 Type 2 diabetes mellitus with diabetic neuropathy, unspecified: Secondary | ICD-10-CM | POA: Diagnosis not present

## 2018-12-16 DIAGNOSIS — M545 Low back pain: Secondary | ICD-10-CM | POA: Diagnosis not present

## 2018-12-19 ENCOUNTER — Other Ambulatory Visit (INDEPENDENT_AMBULATORY_CARE_PROVIDER_SITE_OTHER): Payer: Self-pay | Admitting: Internal Medicine

## 2018-12-23 ENCOUNTER — Other Ambulatory Visit: Payer: Self-pay | Admitting: Neurosurgery

## 2018-12-24 ENCOUNTER — Other Ambulatory Visit (INDEPENDENT_AMBULATORY_CARE_PROVIDER_SITE_OTHER): Payer: Self-pay | Admitting: Internal Medicine

## 2018-12-26 ENCOUNTER — Other Ambulatory Visit: Payer: Self-pay | Admitting: Neurosurgery

## 2018-12-26 DIAGNOSIS — D649 Anemia, unspecified: Secondary | ICD-10-CM | POA: Diagnosis not present

## 2018-12-26 DIAGNOSIS — E1169 Type 2 diabetes mellitus with other specified complication: Secondary | ICD-10-CM | POA: Diagnosis not present

## 2018-12-26 DIAGNOSIS — M549 Dorsalgia, unspecified: Secondary | ICD-10-CM | POA: Diagnosis not present

## 2018-12-26 DIAGNOSIS — Z23 Encounter for immunization: Secondary | ICD-10-CM | POA: Diagnosis not present

## 2018-12-26 DIAGNOSIS — E785 Hyperlipidemia, unspecified: Secondary | ICD-10-CM | POA: Diagnosis not present

## 2018-12-26 DIAGNOSIS — J449 Chronic obstructive pulmonary disease, unspecified: Secondary | ICD-10-CM | POA: Diagnosis not present

## 2018-12-26 DIAGNOSIS — I10 Essential (primary) hypertension: Secondary | ICD-10-CM | POA: Diagnosis not present

## 2018-12-26 DIAGNOSIS — E039 Hypothyroidism, unspecified: Secondary | ICD-10-CM | POA: Diagnosis not present

## 2018-12-31 NOTE — Telephone Encounter (Signed)
Talked with the pharmacy and they said that they filled this prescription 12/23/2018.

## 2019-01-06 DIAGNOSIS — M25579 Pain in unspecified ankle and joints of unspecified foot: Secondary | ICD-10-CM | POA: Diagnosis not present

## 2019-01-06 DIAGNOSIS — M79671 Pain in right foot: Secondary | ICD-10-CM | POA: Diagnosis not present

## 2019-01-06 DIAGNOSIS — M79672 Pain in left foot: Secondary | ICD-10-CM | POA: Diagnosis not present

## 2019-01-07 DIAGNOSIS — R55 Syncope and collapse: Secondary | ICD-10-CM | POA: Insufficient documentation

## 2019-01-07 DIAGNOSIS — R000 Tachycardia, unspecified: Secondary | ICD-10-CM | POA: Insufficient documentation

## 2019-01-07 NOTE — Progress Notes (Signed)
[place], [place] - [address] [address] [place] [address] Phone: [phone] Fax: [phone]  [facility] - [place], [place] [id number] [place] [id number] Phone: [phone] Fax: [phone]    Your procedure is scheduled on Monday, October 26th.  Report to [facility] Main Entrance "A" at 9:50 A.M., and check in at the Admitting office.  Call this number if you have problems the morning of surgery:  [phone]  Call [phone] if you have any questions prior to your surgery date Monday-Friday 8am-4pm   Remember:  Do not eat or drink after midnight the night before your surgery   Take these medicines the morning of surgery with A SIP OF WATER  amLODipine (NORVASC) buPROPion (WELLBUTRIN XL)  docusate sodium (COLACE)  DULoxetine (CYMBALTA)  esomeprazole (NEXIUM)  Fluticasone-Salmeterol (ADVAIR)/inhaler gabapentin (NEURONTIN) levothyroxine (SYNTHROID, LEVOTHROID) metoprolol succinate  propranolol (INDERAL)  simvastatin (ZOCOR)  If needed - albuterol (PROVENTIL HFA;VENTOLIN HFA)/inhaler- bring with you the day of surgery albuterol (PROVENTIL)/nebulizer,  dicyclomine (BENTYL), lubiprostone (AMITIZA), ondansetron (ZOFRAN), traMADol (ULTRAM)      Follow your surgeon's instructions on when to stop Aspirin.  If no instructions were given by your surgeon then you will need to call the office to get those instructions.    As of today, STOP taking any Aspirin (unless otherwise instructed by your surgeon), Aleve, Naproxen, Ibuprofen, Motrin, Advil, Goody's, BC's, all herbal medications, fish oil, and all vitamins.   How to Manage Your Diabetes  Before and After Surgery  Why is it important to control my blood sugar before and after surgery? . Improving blood sugar levels before and after surgery helps healing and can limit problems. . A way of improving blood sugar control is eating a healthy diet  by: o  Eating less sugar and carbohydrates o  Increasing activity/exercise o  Talking with your doctor about reaching your blood sugar goals . High blood sugars (greater than 180 mg/dL) can raise your risk of infections and slow your recovery, so you will need to focus on controlling your diabetes during the weeks before surgery. . Make sure that the doctor who takes care of your diabetes knows about your planned surgery including the date and location.  How do I manage my blood sugar before surgery? . Check your blood sugar at least 4 times a day, starting 2 days before surgery, to make sure that the level is not too high or low. o Check your blood sugar the morning of your surgery when you wake up and every 2 hours until you get to the Short Stay unit. . If your blood sugar is less than 70 mg/dL, you will need to treat for low blood sugar: o Do not take insulin. o Treat a low blood sugar (less than 70 mg/dL) with  cup of clear juice (cranberry or apple), 4 glucose tablets, OR glucose gel. Recheck blood sugar in 15 minutes after treatment (to make sure it is greater than 70 mg/dL). If your blood sugar is not greater than 70 mg/dL on recheck, call [phone] o  for further instructions. . Report your blood sugar to the short stay nurse when you get to Short Stay.  . If you are admitted to the hospital after surgery: o Your blood sugar will be checked by the staff and you will probably be given insulin after surgery (instead of oral diabetes medicines) to make sure you have good blood sugar levels.  o The goal for blood sugar control after surgery is 80-180 mg/dL.  Reviewed and Endorsed by [facility] Patient Education Committee, August 2015  The Morning of Surgery  Do not wear jewelry, make-up or nail polish.  Do not wear lotions, powders, or perfumes or deodorant  Do not shave 48 hours prior to surgery.   Do not bring valuables to the hospital.  [facility] is not responsible for any  belongings or valuables.  If you are a smoker, DO NOT Smoke 24 hours prior to surgery IF you wear a CPAP at night please bring your mask, tubing, and machine the morning of surgery   Remember that you must have someone to transport you home after your surgery, and remain with you for 24 hours if you are discharged the same day.  Contacts, glasses, hearing aids, dentures or bridgework may not be worn into surgery.   Leave your suitcase in the car.  After surgery it may be brought to your room.  For patients admitted to the hospital, discharge time will be determined by your treatment team.  Patients discharged the day of surgery will not be allowed to drive home.   Special instructions:   [place]- Preparing For Surgery  Before surgery, you can play an important role. Because skin is not sterile, your skin needs to be as free of germs as possible. You can reduce the number of germs on your skin by washing with CHG (chlorahexidine gluconate) Soap before surgery.  CHG is an antiseptic cleaner which kills germs and bonds with the skin to continue killing germs even after washing.    Oral Hygiene is also important to reduce your risk of infection.  Remember - BRUSH YOUR TEETH THE MORNING OF SURGERY WITH YOUR REGULAR TOOTHPASTE  Please do not use if you have an allergy to CHG or antibacterial soaps. If your skin becomes reddened/irritated stop using the CHG.  Do not shave (including legs and underarms) for at least 48 hours prior to first CHG shower. It is OK to shave your face.  Please follow these instructions carefully.   1. Shower the NIGHT BEFORE SURGERY and the MORNING OF SURGERY with CHG Soap.   2. If you chose to wash your hair, wash your hair first as usual with your normal shampoo.  3. After you shampoo, rinse your hair and body thoroughly to remove the shampoo.  4. Use CHG as you would any other liquid soap. You can apply CHG directly to the skin and wash gently with a  scrungie or a clean washcloth.   5. Apply the CHG Soap to your body ONLY FROM THE NECK DOWN.  Do not use on open wounds or open sores. Avoid contact with your eyes, ears, mouth and genitals (private parts). Wash Face and genitals (private parts)  with your normal soap.   6. Wash thoroughly, paying special attention to the area where your surgery will be performed.  7. Thoroughly rinse your body with warm water from the neck down.  8. DO NOT shower/wash with your normal soap after using and rinsing off the CHG Soap.  9. Pat yourself dry with a CLEAN TOWEL.  10. Wear CLEAN PAJAMAS to bed the night before surgery, wear comfortable clothes the morning of surgery  11. Place CLEAN SHEETS on your bed the night of your first shower and DO NOT SLEEP WITH PETS.  Day of Surgery: Do not apply any deodorants/lotions. Please shower the morning of surgery with the CHG  soap  Please wear clean clothes to the hospital/surgery center.   Remember to brush your teeth WITH YOUR REGULAR TOOTHPASTE.  Please read over the following fact sheets that you were given.

## 2019-01-07 NOTE — Progress Notes (Signed)
Cardiology Office Note   Date:  01/08/2019   ID:  Heather Espinoza [birth year], MRN [id number]  PCP:  London Pepper, MD  Cardiologist:   Minus Breeding, MD Referring:  London Pepper, MD  No chief complaint on file.     History of Present Illness: Heather Espinoza is a 73 y.o. female who is referred by London Pepper, MD for evaluation of tachycardia and prolonged QT.  The patient has had mention of a prolonged QT in the past.  Although it was not long when I saw her most recently.  She has had syncope that was thought to be related to meds and I previously reduced her lisinopril.    She is about to have back surgery next week.  She has had no new cardiovascular complaints.  I asked her about passing out and she says that she has had 2 episodes.  1 she was leaning over to get something and she kept going forward.  She said she passed out that she remembers going forward and hitting the ground.  It does not sound like she lost consciousness.  Another time she was getting out of a car.  She said I lost my balance and she remembers again hitting the ground.  I cannot document any frank syncope.  She is not describing any new palpitations.  She is not had any new shortness of breath, PND or orthopnea.  She has been fatigued.  She is anemic.    Past Medical History:  Diagnosis Date  . Arthritis   . COPD (chronic obstructive pulmonary disease) ([place])   . Diabetes ([place])   . Diverticulitis   . Diverticulosis   . Fibromyalgia   . High cholesterol   . Hypertension   . Hypothyroid   . IBS (irritable bowel syndrome)   . Kidney stone    Left Kidney  . Osteopenia   . Restless leg   . Sleep apnea    Doesn't use CPAP.    Marland Kitchen Tachycardia    per pt/fim  . Tremor, essential 01/07/2018    Past Surgical History:  Procedure Laterality Date  . APPENDECTOMY    . BIOPSY  12/25/2016   Procedure: BIOPSY;  Surgeon: Rogene Houston, MD;  [facility];  Service: Endoscopy;;  gastric   .  BREAST LUMPECTOMY Right 2001  . CATARACT EXTRACTION Bilateral 2016  . CHOLECYSTECTOMY    . COLONOSCOPY N/A 12/17/2012   Procedure: COLONOSCOPY;  Surgeon: Rogene Houston, MD;  [facility];  Service: Endoscopy;  Laterality: N/A;  215  . complete hysterectomy    . ESOPHAGEAL DILATION N/A 12/25/2016   Procedure: ESOPHAGEAL DILATION;  Surgeon: Rogene Houston, MD;  [facility];  Service: Endoscopy;  Laterality: N/A;  . ESOPHAGOGASTRODUODENOSCOPY N/A 12/25/2016   Procedure: ESOPHAGOGASTRODUODENOSCOPY (EGD);  Surgeon: Rogene Houston, MD;  [facility];  Service: Endoscopy;  Laterality: N/A;  730  . Heel tumor removed    . rt elbow surgery    . TONSILLECTOMY       Current Outpatient Medications  Medication Sig Dispense Refill  . albuterol (PROVENTIL HFA;VENTOLIN HFA) 108 (90 Base) MCG/ACT inhaler Inhale 2 puffs into the lungs every 6 (six) hours as needed for wheezing or shortness of breath.    Marland Kitchen albuterol (PROVENTIL) (2.5 MG/3ML) 0.083% nebulizer solution Take 2.5 mg by nebulization every 6 (six) hours as needed for wheezing or shortness of breath.    Marland Kitchen amLODipine (NORVASC)  5 MG tablet Take 1.5 tablets (7.5 mg total) by mouth daily. 135 tablet 3  . aspirin 81 MG chewable tablet Chew 81 mg by mouth daily.    . Biotin 10 MG TABS Take 10 mg by mouth daily.     . bisacodyl (DULCOLAX) 10 MG suppository Place 1 suppository (10 mg total) rectally as needed for moderate constipation. 12 suppository 0  . buPROPion (WELLBUTRIN XL) 150 MG 24 hr tablet Take 150 mg by mouth daily.     . cholecalciferol (VITAMIN D) 1000 units tablet Take 1,000 Units by mouth daily.     . Cyanocobalamin (B-12) 2500 MCG SUBL Place 2,500 mcg under the tongue daily.    Marland Kitchen dicyclomine (BENTYL) 10 MG capsule TAKE 1 CAPSULE BY MOUTH TWICE DAILY AS NEEDED FOR  SPASMS (Patient taking differently: Take 20 mg by mouth 3 (three) times daily as needed for spasms. ) 60 capsule 0  . docusate sodium  (COLACE) 100 MG capsule Take 2 capsules (200 mg total) by mouth at bedtime. (Patient taking differently: Take 200 mg by mouth daily. ) 10 capsule 0  . DULoxetine (CYMBALTA) 60 MG capsule Take 1 capsule by mouth twice daily (Patient taking differently: Take 60 mg by mouth 2 (two) times daily. ) 180 capsule 1  . esomeprazole (NEXIUM) 20 MG capsule Take 40 mg by mouth 2 (two) times daily before a meal.     . esomeprazole (NEXIUM) 40 MG capsule Take 1 capsule (40 mg total) by mouth daily before breakfast. 30 capsule 5  . famotidine (PEPCID) 40 MG tablet TAKE 1 TABLET BY MOUTH AT BEDTIME (Patient taking differently: Take 40 mg by mouth at bedtime. ) 30 tablet 1  . Fluticasone-Salmeterol (ADVAIR) 250-50 MCG/DOSE AEPB Inhale 1 puff into the lungs every 12 (twelve) hours.    . gabapentin (NEURONTIN) 400 MG capsule Take 400 mg by mouth 2 (two) times daily.     Marland Kitchen GELATIN PO Take 1 capsule by mouth daily. After a meal    . levothyroxine (SYNTHROID, LEVOTHROID) 75 MCG tablet Take 37.5 mcg by mouth daily before breakfast.     . lisinopril (PRINIVIL,ZESTRIL) 10 MG tablet Take 10 mg by mouth daily.     Marland Kitchen lubiprostone (AMITIZA) 8 MCG capsule Take 2 capsules (16 mcg total) by mouth daily with breakfast. (Patient taking differently: Take 8 mcg by mouth 2 (two) times daily as needed for constipation. ) 48 capsule 0  . metformin (FORTAMET) 500 MG (OSM) 24 hr tablet Take 500 mg by mouth every evening.     . metoprolol succinate (TOPROL-XL) 25 MG 24 hr tablet Take 12.5 mg by mouth daily.     . mirtazapine (REMERON) 30 MG tablet Take 30 mg by mouth at bedtime.     . ondansetron (ZOFRAN) 4 MG tablet TAKE 1 TABLET BY MOUTH TWICE DAILY AS NEEDED FOR NAUSEA FOR VOMITING (Patient taking differently: Take 4 mg by mouth every 6 (six) hours as needed for nausea. ) 30 tablet 0  . propranolol (INDERAL) 20 MG tablet Take 1 tablet (20 mg total) by mouth 2 (two) times daily. 180 tablet 2  . simvastatin (ZOCOR) 20 MG tablet Take 20 mg  by mouth every evening.    . traMADol (ULTRAM) 50 MG tablet Take 50 mg by mouth daily as needed for moderate pain.     . traZODone (DESYREL) 100 MG tablet Take 100 mg by mouth at bedtime as needed for sleep.     . vitamin E 400 UNIT  capsule Take 400 Units by mouth daily.     No current facility-administered medications for this visit.     Allergies:   Flagyl [metronidazole], Lyrica [pregabalin], Adhesive [tape], Aspirin, Latex, Phenergan [promethazine hcl], Prednisone, Sulfa antibiotics, and Tetanus toxoids    ROS:  Please see the history of present illness.   Otherwise, review of systems are positive for none.   All other systems are reviewed and negative.    PHYSICAL EXAM: VS:  BP 118/65   Pulse 60   Temp (!) 96.8 F (36 C)   Ht 5' 1.5" (1.562 m)   Wt 143 lb 3.2 oz (65 kg)   SpO2 98%   BMI 26.62 kg/m  , BMI Body mass index is 26.62 kg/m. GENERAL:  Well appearing NECK:  No jugular venous distention, waveform within normal limits, carotid upstroke brisk and symmetric, no bruits, no thyromegaly LUNGS:  Clear to auscultation bilaterally BACK:  No CVA tenderness CHEST:  Unremarkable HEART:  PMI not displaced or sustained,S1 and S2 within normal limits, no S3, no S4, no clicks, no rubs, no murmurs ABD:  Flat, positive bowel sounds normal in frequency in pitch, no bruits, no rebound, no guarding, no midline pulsatile mass, no hepatomegaly, no splenomegaly EXT:  2 plus pulses throughout, no edema, no cyanosis no clubbing   EKG:  EKG is not ordered today.   Recent Labs: 04/09/2018: Magnesium 1.4 04/10/2018: BUN <5; Creatinine, Ser 1.03; Hemoglobin 9.2; Platelets 343; Potassium 4.0; Sodium 143 05/09/2018: ALT 7; TSH 1.340    Lipid Panel    Component Value Date/Time   CHOL 129 05/09/2018 1511   TRIG 134 05/09/2018 1511   HDL 53 05/09/2018 1511   CHOLHDL 2.4 05/09/2018 1511   LDLCALC 49 05/09/2018 1511      Wt Readings from Last 3 Encounters:  01/08/19 143 lb 3.2 oz (65 kg)   07/15/18 139 lb (63 kg)  07/01/18 124 lb (56.2 kg)      Other studies Reviewed: Additional studies/ records that were reviewed today include: None Review of the above records demonstrates:  NA   ASSESSMENT AND PLAN:  TACHYCARDIA:     Her heart rate is improved.  No change in therapy.  No further work-up.  HTN: She has had some falls.  I do not think she had any syncope.  Her blood pressures running low.  Again reduce the Norvasc to 5 mg daily.  PROLONGED QT: This is been transient.  QT was not prolonged at the previous EKG.  No change in therapy.  No further work-up.   SYNCOPE: The 2 most recent episodes of going to the ground did not sound like syncope.  I do not note any suggestion of bradycardia arrhythmias or other arrhythmias.  No further work-up without documented syncope.   PREOP: The patient would be acceptable risk for the planned surgery next week.   Current medicines are reviewed at length with the patient today.  The patient does not have concerns regarding medicines.  The following changes have been made:  As above  Labs/ tests ordered today include: None  No orders of the defined types were placed in this encounter.    Disposition:   FU with me as needed.  Ronnell Guadalajara, MD  01/08/2019 12:22 PM    [facility]

## 2019-01-08 ENCOUNTER — Other Ambulatory Visit (HOSPITAL_COMMUNITY)
Admission: RE | Admit: 2019-01-08 | Discharge: 2019-01-08 | Disposition: A | Discharge status: Home / Self Care | Payer: Medicare Other | Source: Ambulatory Visit | Attending: Neurosurgery | Admitting: Neurosurgery

## 2019-01-08 ENCOUNTER — Encounter (HOSPITAL_COMMUNITY)
Admission: RE | Admit: 2019-01-08 | Discharge: 2019-01-08 | Disposition: A | Discharge status: Home / Self Care | Payer: Medicare Other | Source: Ambulatory Visit | Attending: Neurosurgery | Admitting: Neurosurgery

## 2019-01-08 ENCOUNTER — Encounter (HOSPITAL_COMMUNITY): Payer: Self-pay

## 2019-01-08 ENCOUNTER — Ambulatory Visit (INDEPENDENT_AMBULATORY_CARE_PROVIDER_SITE_OTHER): Payer: Medicare Other | Admitting: Cardiology

## 2019-01-08 ENCOUNTER — Encounter: Payer: Self-pay | Admitting: Cardiology

## 2019-01-08 ENCOUNTER — Other Ambulatory Visit: Payer: Self-pay

## 2019-01-08 VITALS — BP 118/65 | HR 60 | Temp 96.8°F | Ht 61.5 in | Wt 143.2 lb

## 2019-01-08 DIAGNOSIS — Z7982 Long term (current) use of aspirin: Secondary | ICD-10-CM | POA: Diagnosis not present

## 2019-01-08 DIAGNOSIS — I10 Essential (primary) hypertension: Secondary | ICD-10-CM | POA: Insufficient documentation

## 2019-01-08 DIAGNOSIS — Z9010 Acquired absence of unspecified breast and nipple: Secondary | ICD-10-CM | POA: Diagnosis not present

## 2019-01-08 DIAGNOSIS — Z01812 Encounter for preprocedural laboratory examination: Secondary | ICD-10-CM | POA: Diagnosis not present

## 2019-01-08 DIAGNOSIS — E039 Hypothyroidism, unspecified: Secondary | ICD-10-CM | POA: Diagnosis not present

## 2019-01-08 DIAGNOSIS — M797 Fibromyalgia: Secondary | ICD-10-CM | POA: Insufficient documentation

## 2019-01-08 DIAGNOSIS — Z79899 Other long term (current) drug therapy: Secondary | ICD-10-CM | POA: Diagnosis not present

## 2019-01-08 DIAGNOSIS — R55 Syncope and collapse: Secondary | ICD-10-CM

## 2019-01-08 DIAGNOSIS — Z79890 Hormone replacement therapy (postmenopausal): Secondary | ICD-10-CM | POA: Diagnosis not present

## 2019-01-08 DIAGNOSIS — J449 Chronic obstructive pulmonary disease, unspecified: Secondary | ICD-10-CM | POA: Insufficient documentation

## 2019-01-08 DIAGNOSIS — M4316 Spondylolisthesis, lumbar region: Secondary | ICD-10-CM | POA: Diagnosis not present

## 2019-01-08 DIAGNOSIS — E7800 Pure hypercholesterolemia, unspecified: Secondary | ICD-10-CM | POA: Diagnosis not present

## 2019-01-08 DIAGNOSIS — G4733 Obstructive sleep apnea (adult) (pediatric): Secondary | ICD-10-CM | POA: Insufficient documentation

## 2019-01-08 DIAGNOSIS — Z7984 Long term (current) use of oral hypoglycemic drugs: Secondary | ICD-10-CM | POA: Diagnosis not present

## 2019-01-08 DIAGNOSIS — R000 Tachycardia, unspecified: Secondary | ICD-10-CM

## 2019-01-08 DIAGNOSIS — K589 Irritable bowel syndrome without diarrhea: Secondary | ICD-10-CM | POA: Insufficient documentation

## 2019-01-08 DIAGNOSIS — Z9049 Acquired absence of other specified parts of digestive tract: Secondary | ICD-10-CM | POA: Insufficient documentation

## 2019-01-08 DIAGNOSIS — E119 Type 2 diabetes mellitus without complications: Secondary | ICD-10-CM | POA: Insufficient documentation

## 2019-01-08 HISTORY — DX: Dyspnea, unspecified: R06.00

## 2019-01-08 HISTORY — DX: Pneumonia, unspecified organism: J18.9

## 2019-01-08 LAB — BASIC METABOLIC PANEL
Anion gap: 11 (ref 5–15)
BUN: 10 mg/dL (ref 8–23)
CO2: 26 mmol/L (ref 22–32)
Calcium: 9.3 mg/dL (ref 8.9–10.3)
Chloride: 103 mmol/L (ref 98–111)
Creatinine, Ser: 1.48 mg/dL — ABNORMAL HIGH (ref 0.44–1.00)
GFR calc Af Amer: 41 mL/min — ABNORMAL LOW (ref >60)
GFR calc non Af Amer: 35 mL/min — ABNORMAL LOW (ref >60)
Glucose, Bld: 99 mg/dL (ref 70–99)
Potassium: 4.9 mmol/L (ref 3.5–5.1)
Sodium: 140 mmol/L (ref 135–145)

## 2019-01-08 LAB — CBC
HCT: 33.5 % — ABNORMAL LOW (ref 36.0–46.0)
Hemoglobin: 10.7 g/dL — ABNORMAL LOW (ref 12.0–15.0)
MCH: 30.7 pg (ref 26.0–34.0)
MCHC: 31.9 g/dL (ref 30.0–36.0)
MCV: 96.3 fL (ref 80.0–100.0)
Platelets: 333 10*3/uL (ref 150–400)
RBC: 3.48 MIL/uL — ABNORMAL LOW (ref 3.87–5.11)
RDW: 12.7 % (ref 11.5–15.5)
WBC: 7.5 10*3/uL (ref 4.0–10.5)
nRBC: 0 % (ref 0.0–0.2)

## 2019-01-08 LAB — HEMOGLOBIN A1C
Hgb A1c MFr Bld: 6.2 % — ABNORMAL HIGH (ref 4.8–5.6)
Mean Plasma Glucose: 131.24 mg/dL

## 2019-01-08 LAB — SURGICAL PCR SCREEN
MRSA, PCR: NEGATIVE
Staphylococcus aureus: POSITIVE — AB

## 2019-01-08 LAB — TYPE AND SCREEN
ABO/RH(D): O POS
Antibody Screen: NEGATIVE

## 2019-01-08 LAB — ABO/RH: ABO/RH(D): O POS

## 2019-01-08 LAB — GLUCOSE, CAPILLARY: Glucose-Capillary: 92 mg/dL (ref 70–99)

## 2019-01-08 MED ORDER — AMLODIPINE BESYLATE 5 MG PO TABS: 5.0000 mg | ORAL_TABLET | Freq: Every day | ORAL | 3 refills | Status: DC

## 2019-01-08 NOTE — Progress Notes (Signed)
Covid test done today. No Covid sx. No recent h/o travel. No h/o exposure to Covid positive patients.  PCP - Dr Orland Mustard, Marjory Lies  Cardiologist - Dr Minus Breeding  Chest x-ray - NA  EKG - 05-09-18  Stress Test - denies  ECHO - 08-19-17  Cardiac Cath - 2002  AICD-denies PM-denies LOOP-denies  Sleep Study - Y CPAP - Does not wear one, due to allergy to latex from the mask.  LABS-T/S, CBC,BMP, PCR,A1C  ASA-LD- 01/07/19  ERAS-NA  HA1C-Y Fasting Blood Sugar -92  Lo-70's    Hi-247  Checks Blood Sugar _2____ times a day  Anesthesia-Y. Cardiac history,  Pt denies having palpitations,chest pain, sob, or fever at this time. All instructions explained to the pt, with a verbal understanding of the material. Pt agrees to go over the instructions while at home for a better understanding. Pt also instructed to self quarantine after being tested for COVID-19. The opportunity to ask questions was provided.

## 2019-01-08 NOTE — Patient Instructions (Signed)
Medication Instructions:  Decrease Amlodipine to 5mg  daily.   If you need a refill on your cardiac medications before your next appointment, please call your pharmacy.   Lab work: NONE  Testing/Procedures: NONE  Follow-Up:  Make appointment as needed.

## 2019-01-09 NOTE — Anesthesia Preprocedure Evaluation (Addendum)
Anesthesia Evaluation  Patient identified by MRN, date of birth, ID band Patient awake    Reviewed: Allergy & Precautions, H&P , NPO status , Patient's Chart, lab work & pertinent test results  Airway Mallampati: II   Neck ROM: full    Dental  (+) Partial Upper, Dental Advisory Given   Pulmonary shortness of breath, sleep apnea , COPD,    breath sounds clear to auscultation       Cardiovascular hypertension,  Rhythm:regular Rate:Normal     Neuro/Psych PSYCHIATRIC DISORDERS Depression  Neuromuscular disease    GI/Hepatic GERD  ,  Endo/Other  diabetes, Type 2Hypothyroidism   Renal/GU Renal InsufficiencyRenal disease     Musculoskeletal  (+) Arthritis , Fibromyalgia -  Abdominal   Peds  Hematology   Anesthesia Other Findings   Reproductive/Obstetrics                           Anesthesia Physical Anesthesia Plan  ASA: III  Anesthesia Plan: General   Post-op Pain Management:    Induction: Intravenous  PONV Risk Score and Plan: 3 and Ondansetron, Dexamethasone and Treatment may vary due to age or medical condition  Airway Management Planned: Oral ETT  Additional Equipment:   Intra-op Plan:   Post-operative Plan: Extubation in OR  Informed Consent: I have reviewed the patients History and Physical, chart, labs and discussed the procedure including the risks, benefits and alternatives for the proposed anesthesia with the patient or authorized representative who has indicated his/her understanding and acceptance.       Plan Discussed with: CRNA, Anesthesiologist and Surgeon  Anesthesia Plan Comments: (PAT note written 01/09/2019 by Myra Gianotti, PA-C. )       Anesthesia Quick Evaluation

## 2019-01-09 NOTE — Progress Notes (Signed)
Anesthesia Chart Review:  Case: [id number] Date/Time: 01/12/19 1137   Procedure: POSTERIOR LUMBAR INTERBODY FUSION, POSTERIOR INSTRUMENTATION LUMBAR 4- LUMBAR 5 (N/A ) - POSTERIOR LUMBAR INTERBODY FUSION, POSTERIOR INSTRUMENTATION LUMBAR 4- LUMBAR 5   Anesthesia type: General   Pre-op diagnosis: SPONDYLOLISTHESIS, LUMBAR REGION   Location: [facility] OR ROOM 19 / [facility]   Surgeon: Newman Pies, MD      DISCUSSION: Patient is a 73 year old female scheduled for the above procedure.  History includes never smoker, HTN, DM2, COPD, fibromyalgia, IBS, OSA (no CPAP), RLS, hypothyroidism, hypercholesterolemia, essential tremor, dyspnea, tachycardia, prolonged QT.  Patient evaluated by cardiologist Dr. Percival Spanish on 01/09/2019 for tachycardia and prolonged QT follow-up.  Tachycardia had improved.  Prolonged QT appeared to be transient, as it was not noted on previous EKG.  Although she reported to episodes "going to the ground", it did not sound like she had had episodes of any recent frank syncope. (Lisinopril dose decreased after episodes.) No further cardiac work-up recommended at that time.  He felt she was "acceptable risk" for planned surgery.  As needed cardiology follow-up recommended.  01/08/2019 COVID-19 test is in process.  If negative and otherwise no acute changes and I would anticipate that she could proceed as planned.   VS: BP 111/64   Pulse 72   Temp 36.7 C   Resp 20   Ht 5' 1.5" (1.562 m)   Wt 65 kg   SpO2 97%   BMI 26.62 kg/m   PROVIDERS: London Pepper, MD his PCP Minus Breeding, MD is cardiologist.  As needed follow-up recommended at 01/08/2019 visit. Kathrynn Ducking, MD is neurologist   LABS: Labs reviewed: Acceptable for surgery. Cr 1.48, previously 1.03-1.20 03/2018. (all labs ordered are listed, but only abnormal results are displayed)  Labs Reviewed  SURGICAL PCR SCREEN - Abnormal; Notable for the following components:      Result Value   Staphylococcus aureus  POSITIVE (*)    All other components within normal limits  BASIC METABOLIC PANEL - Abnormal; Notable for the following components:   Creatinine, Ser 1.48 (*)    GFR calc non Af Amer 35 (*)    GFR calc Af Amer 41 (*)    All other components within normal limits  CBC - Abnormal; Notable for the following components:   RBC 3.48 (*)    Hemoglobin 10.7 (*)    HCT 33.5 (*)    All other components within normal limits  HEMOGLOBIN A1C - Abnormal; Notable for the following components:   Hgb A1c MFr Bld 6.2 (*)    All other components within normal limits  GLUCOSE, CAPILLARY  TYPE AND SCREEN  ABO/RH     IMAGES: MRI L-spine 10/20/2018: IMPRESSION: This MRI of the lumbar spine without contrast shows the following: 1.  At L3-L4 there is mild spinal stenosis causing mild foraminal narrowing and mild lateral recess stenosis but no nerve root compression 2.  At L4-L5, there is a left paramedian disc protrusion and facet hypertrophy causing mild spinal stenosis, moderate left foraminal narrowing and moderately severe left lateral recess stenosis.  There is potential for left L5 nerve root compression.  This level has progressed compared to the 2017 MRI. 3.  At L5-S1, there is 2 mm anterolisthesis associated with severe facet hypertrophy.  No nerve root compression.  CT Chest 04/09/2018: IMPRESSION: 1. Bibasilar subsegmental consolidative changes and scattered bilateral confluent hazy and nodular densities most consistent with pneumonia. Clinical correlation and follow-up to resolution recommended. 2. Small bilateral pleural  effusions.   EKG: 05/09/2018 ([facility]): Per Dr. Rosezella [place] interpretation: "The ekg ordered today demonstrates sinus rhythm, rate 75, axis within normal limits, intervals within normal limits, no acute ST-T wave changes."   CV: Echo 08/19/2017 ([facility]): Impressions: Based on echocardiographic findings at this study, the risk for bacterial endocarditis is low.   Antibiotic prophylaxis is not recommended.  Normal left ventricular size and function.  Estimated LVEF 65 to 70%.  There are no wall motion abnormalities observed.  Left ventricular diastolic function is normal.  Mild mitral regurgitation.  Mild tricuspid regurgitation.  Mild elevation of estimated RVSP, 30.06 mmHg.  Cardiac cath on 05/28/2000 showed normal coronaries and left ventricle.   Past Medical History:  Diagnosis Date  . Arthritis   . COPD (chronic obstructive pulmonary disease) ([place])   . Diabetes ([place])   . Diverticulitis   . Diverticulosis   . Dyspnea   . Fibromyalgia   . High cholesterol   . Hypertension   . Hypothyroid   . IBS (irritable bowel syndrome)   . Kidney stone    Left Kidney  . Osteopenia   . Pneumonia   . Restless leg   . Sleep apnea    Doesn't use CPAP.    Marland Kitchen Tachycardia    per pt/fim  . Tremor, essential 01/07/2018    Past Surgical History:  Procedure Laterality Date  . APPENDECTOMY    . BIOPSY  12/25/2016   Procedure: BIOPSY;  Surgeon: Rogene Houston, MD;  [facility];  Service: Endoscopy;;  gastric   . BREAST LUMPECTOMY Right 2001  . CATARACT EXTRACTION Bilateral 2016  . CHOLECYSTECTOMY    . COLONOSCOPY N/A 12/17/2012   Procedure: COLONOSCOPY;  Surgeon: Rogene Houston, MD;  [facility];  Service: Endoscopy;  Laterality: N/A;  215  . complete hysterectomy    . ESOPHAGEAL DILATION N/A 12/25/2016   Procedure: ESOPHAGEAL DILATION;  Surgeon: Rogene Houston, MD;  [facility];  Service: Endoscopy;  Laterality: N/A;  . ESOPHAGOGASTRODUODENOSCOPY N/A 12/25/2016   Procedure: ESOPHAGOGASTRODUODENOSCOPY (EGD);  Surgeon: Rogene Houston, MD;  [facility];  Service: Endoscopy;  Laterality: N/A;  730  . Heel tumor removed    . rt elbow surgery    . TONSILLECTOMY      MEDICATIONS: . albuterol (PROVENTIL HFA;VENTOLIN HFA) 108 (90 Base) MCG/ACT inhaler  . albuterol (PROVENTIL) (2.5 MG/3ML) 0.083% nebulizer  solution  . amLODipine (NORVASC) 5 MG tablet  . aspirin 81 MG chewable tablet  . Biotin 10 MG TABS  . bisacodyl (DULCOLAX) 10 MG suppository  . buPROPion (WELLBUTRIN XL) 150 MG 24 hr tablet  . cholecalciferol (VITAMIN D) 1000 units tablet  . Cyanocobalamin (B-12) 2500 MCG SUBL  . dicyclomine (BENTYL) 10 MG capsule  . docusate sodium (COLACE) 100 MG capsule  . DULoxetine (CYMBALTA) 60 MG capsule  . esomeprazole (NEXIUM) 20 MG capsule  . esomeprazole (NEXIUM) 40 MG capsule  . famotidine (PEPCID) 40 MG tablet  . Fluticasone-Salmeterol (ADVAIR) 250-50 MCG/DOSE AEPB  . gabapentin (NEURONTIN) 400 MG capsule  . GELATIN PO  . levothyroxine (SYNTHROID, LEVOTHROID) 75 MCG tablet  . lisinopril (PRINIVIL,ZESTRIL) 10 MG tablet  . lubiprostone (AMITIZA) 8 MCG capsule  . metformin (FORTAMET) 500 MG (OSM) 24 hr tablet  . metoprolol succinate (TOPROL-XL) 25 MG 24 hr tablet  . mirtazapine (REMERON) 30 MG tablet  . ondansetron (ZOFRAN) 4 MG tablet  . propranolol (INDERAL) 20 MG tablet  . simvastatin (ZOCOR) 20 MG tablet  .  traMADol (ULTRAM) 50 MG tablet  . traZODone (DESYREL) 100 MG tablet  . vitamin E 400 UNIT capsule   No current facility-administered medications for this encounter.    Last aspirin dose 01/07/2019.   Myra Gianotti, PA-C Surgical Short Stay/Anesthesiology [facility] Phone [phone] [facility] Phone [phone] 01/09/2019 9:46 AM

## 2019-01-10 LAB — NOVEL CORONAVIRUS, NAA (HOSP ORDER, SEND-OUT TO REF LAB; TAT 18-24 HRS): SARS-CoV-2, NAA: NOT DETECTED

## 2019-01-12 ENCOUNTER — Inpatient Hospital Stay (HOSPITAL_COMMUNITY): Payer: Medicare Other | Admitting: Vascular Surgery

## 2019-01-12 ENCOUNTER — Inpatient Hospital Stay (HOSPITAL_COMMUNITY): Payer: Medicare Other | Admitting: Certified Registered Nurse Anesthetist

## 2019-01-12 ENCOUNTER — Other Ambulatory Visit: Payer: Self-pay

## 2019-01-12 ENCOUNTER — Encounter (HOSPITAL_COMMUNITY): Payer: Self-pay

## 2019-01-12 ENCOUNTER — Inpatient Hospital Stay (HOSPITAL_COMMUNITY): Payer: Medicare Other

## 2019-01-12 ENCOUNTER — Inpatient Hospital Stay (HOSPITAL_COMMUNITY)
Admission: RE | Admit: 2019-01-12 | Discharge: 2019-01-14 | DRG: 455 | Disposition: A | Discharge status: Home Health | Payer: Medicare Other | Attending: Neurosurgery | Admitting: Neurosurgery

## 2019-01-12 ENCOUNTER — Encounter (HOSPITAL_COMMUNITY)
Admission: RE | Disposition: A | Discharge status: Home Health | Payer: Self-pay | Source: Home / Self Care | Attending: Neurosurgery

## 2019-01-12 DIAGNOSIS — E039 Hypothyroidism, unspecified: Secondary | ICD-10-CM | POA: Diagnosis present

## 2019-01-12 DIAGNOSIS — M797 Fibromyalgia: Secondary | ICD-10-CM | POA: Diagnosis present

## 2019-01-12 DIAGNOSIS — Z87442 Personal history of urinary calculi: Secondary | ICD-10-CM | POA: Diagnosis not present

## 2019-01-12 DIAGNOSIS — M4316 Spondylolisthesis, lumbar region: Secondary | ICD-10-CM | POA: Diagnosis present

## 2019-01-12 DIAGNOSIS — I10 Essential (primary) hypertension: Secondary | ICD-10-CM | POA: Diagnosis present

## 2019-01-12 DIAGNOSIS — Z882 Allergy status to sulfonamides status: Secondary | ICD-10-CM

## 2019-01-12 DIAGNOSIS — G4730 Sleep apnea, unspecified: Secondary | ICD-10-CM | POA: Diagnosis present

## 2019-01-12 DIAGNOSIS — G2581 Restless legs syndrome: Secondary | ICD-10-CM | POA: Diagnosis present

## 2019-01-12 DIAGNOSIS — Z886 Allergy status to analgesic agent status: Secondary | ICD-10-CM

## 2019-01-12 DIAGNOSIS — Z8701 Personal history of pneumonia (recurrent): Secondary | ICD-10-CM | POA: Diagnosis not present

## 2019-01-12 DIAGNOSIS — Z79890 Hormone replacement therapy (postmenopausal): Secondary | ICD-10-CM

## 2019-01-12 DIAGNOSIS — M48062 Spinal stenosis, lumbar region with neurogenic claudication: Principal | ICD-10-CM | POA: Diagnosis present

## 2019-01-12 DIAGNOSIS — Z981 Arthrodesis status: Secondary | ICD-10-CM | POA: Diagnosis not present

## 2019-01-12 DIAGNOSIS — Z972 Presence of dental prosthetic device (complete) (partial): Secondary | ICD-10-CM

## 2019-01-12 DIAGNOSIS — J449 Chronic obstructive pulmonary disease, unspecified: Secondary | ICD-10-CM | POA: Diagnosis present

## 2019-01-12 DIAGNOSIS — Z887 Allergy status to serum and vaccine status: Secondary | ICD-10-CM

## 2019-01-12 DIAGNOSIS — Z79899 Other long term (current) drug therapy: Secondary | ICD-10-CM | POA: Diagnosis not present

## 2019-01-12 DIAGNOSIS — M5116 Intervertebral disc disorders with radiculopathy, lumbar region: Secondary | ICD-10-CM | POA: Diagnosis present

## 2019-01-12 DIAGNOSIS — Z91040 Latex allergy status: Secondary | ICD-10-CM

## 2019-01-12 DIAGNOSIS — Z7982 Long term (current) use of aspirin: Secondary | ICD-10-CM

## 2019-01-12 DIAGNOSIS — Z91048 Other nonmedicinal substance allergy status: Secondary | ICD-10-CM

## 2019-01-12 DIAGNOSIS — M4726 Other spondylosis with radiculopathy, lumbar region: Secondary | ICD-10-CM | POA: Diagnosis not present

## 2019-01-12 DIAGNOSIS — M1990 Unspecified osteoarthritis, unspecified site: Secondary | ICD-10-CM | POA: Diagnosis present

## 2019-01-12 DIAGNOSIS — Z419 Encounter for procedure for purposes other than remedying health state, unspecified: Secondary | ICD-10-CM

## 2019-01-12 DIAGNOSIS — E7800 Pure hypercholesterolemia, unspecified: Secondary | ICD-10-CM | POA: Diagnosis present

## 2019-01-12 DIAGNOSIS — M8580 Other specified disorders of bone density and structure, unspecified site: Secondary | ICD-10-CM | POA: Diagnosis present

## 2019-01-12 DIAGNOSIS — Z833 Family history of diabetes mellitus: Secondary | ICD-10-CM

## 2019-01-12 DIAGNOSIS — Z881 Allergy status to other antibiotic agents status: Secondary | ICD-10-CM | POA: Diagnosis not present

## 2019-01-12 DIAGNOSIS — E119 Type 2 diabetes mellitus without complications: Secondary | ICD-10-CM | POA: Diagnosis present

## 2019-01-12 DIAGNOSIS — Z888 Allergy status to other drugs, medicaments and biological substances status: Secondary | ICD-10-CM

## 2019-01-12 DIAGNOSIS — M4326 Fusion of spine, lumbar region: Secondary | ICD-10-CM | POA: Diagnosis not present

## 2019-01-12 DIAGNOSIS — Z825 Family history of asthma and other chronic lower respiratory diseases: Secondary | ICD-10-CM

## 2019-01-12 LAB — GLUCOSE, CAPILLARY
Glucose-Capillary: 101 mg/dL — ABNORMAL HIGH (ref 70–99)
Glucose-Capillary: 124 mg/dL — ABNORMAL HIGH (ref 70–99)
Glucose-Capillary: 134 mg/dL — ABNORMAL HIGH (ref 70–99)
Glucose-Capillary: 169 mg/dL — ABNORMAL HIGH (ref 70–99)
Glucose-Capillary: 196 mg/dL — ABNORMAL HIGH (ref 70–99)

## 2019-01-12 SURGERY — POSTERIOR LUMBAR FUSION 1 LEVEL
Anesthesia: General

## 2019-01-12 MED ORDER — BISACODYL 10 MG RE SUPP: 10.0000 mg | Freq: Every day | RECTAL | Status: DC | PRN

## 2019-01-12 MED ORDER — BUPIVACAINE-EPINEPHRINE 0.5% -1:200000 IJ SOLN
INTRAMUSCULAR | Status: AC
  Filled 2019-01-12: qty 1

## 2019-01-12 MED ORDER — ONDANSETRON HCL 4 MG/2ML IJ SOLN
INTRAMUSCULAR | Status: DC | PRN
  Administered 2019-01-12: 4 mg via INTRAVENOUS

## 2019-01-12 MED ORDER — CHLORHEXIDINE GLUCONATE CLOTH 2 % EX PADS: 6.0000 | MEDICATED_PAD | Freq: Once | CUTANEOUS | Status: DC

## 2019-01-12 MED ORDER — CEFAZOLIN SODIUM-DEXTROSE 2-4 GM/100ML-% IV SOLN
2.0000 g | INTRAVENOUS | Status: AC
  Administered 2019-01-12: 2 g via INTRAVENOUS
  Filled 2019-01-12: qty 100

## 2019-01-12 MED ORDER — OXYCODONE HCL 5 MG PO TABS
10.0000 mg | ORAL_TABLET | ORAL | Status: DC | PRN
  Administered 2019-01-12 – 2019-01-13 (×3): 10 mg via ORAL
  Filled 2019-01-12 (×3): qty 2

## 2019-01-12 MED ORDER — PROPRANOLOL HCL 20 MG PO TABS
20.0000 mg | ORAL_TABLET | Freq: Two times a day (BID) | ORAL | Status: DC
  Filled 2019-01-12 (×4): qty 1

## 2019-01-12 MED ORDER — ALBUTEROL SULFATE HFA 108 (90 BASE) MCG/ACT IN AERS: 2.0000 | INHALATION_SPRAY | Freq: Four times a day (QID) | RESPIRATORY_TRACT | Status: DC | PRN

## 2019-01-12 MED ORDER — BACITRACIN ZINC 500 UNIT/GM EX OINT
TOPICAL_OINTMENT | CUTANEOUS | Status: DC | PRN
  Administered 2019-01-12: 1 via TOPICAL

## 2019-01-12 MED ORDER — FAMOTIDINE 20 MG PO TABS
40.0000 mg | ORAL_TABLET | Freq: Every day | ORAL | Status: DC
  Administered 2019-01-12 – 2019-01-13 (×2): 40 mg via ORAL
  Filled 2019-01-12 (×2): qty 2

## 2019-01-12 MED ORDER — INSULIN ASPART 100 UNIT/ML ~~LOC~~ SOLN: 0.0000 [IU] | Freq: Three times a day (TID) | SUBCUTANEOUS | Status: DC

## 2019-01-12 MED ORDER — EPHEDRINE SULFATE-NACL 50-0.9 MG/10ML-% IV SOSY
PREFILLED_SYRINGE | INTRAVENOUS | Status: DC | PRN
  Administered 2019-01-12 (×2): 10 mg via INTRAVENOUS

## 2019-01-12 MED ORDER — PROPOFOL 10 MG/ML IV BOLUS
INTRAVENOUS | Status: AC
  Filled 2019-01-12: qty 40

## 2019-01-12 MED ORDER — ONDANSETRON HCL 4 MG PO TABS: 4.0000 mg | ORAL_TABLET | Freq: Four times a day (QID) | ORAL | Status: DC | PRN

## 2019-01-12 MED ORDER — SODIUM CHLORIDE 0.9% FLUSH: 3.0000 mL | INTRAVENOUS | Status: DC | PRN

## 2019-01-12 MED ORDER — ROCURONIUM BROMIDE 10 MG/ML (PF) SYRINGE
PREFILLED_SYRINGE | INTRAVENOUS | Status: DC | PRN
  Administered 2019-01-12: 60 mg via INTRAVENOUS
  Administered 2019-01-12 (×2): 20 mg via INTRAVENOUS

## 2019-01-12 MED ORDER — METOPROLOL SUCCINATE ER 25 MG PO TB24
12.5000 mg | ORAL_TABLET | Freq: Every day | ORAL | Status: DC
  Filled 2019-01-12: qty 1

## 2019-01-12 MED ORDER — DICYCLOMINE HCL 10 MG PO CAPS: 20.0000 mg | ORAL_CAPSULE | Freq: Three times a day (TID) | ORAL | Status: DC | PRN

## 2019-01-12 MED ORDER — OXYCODONE HCL 5 MG PO TABS
5.0000 mg | ORAL_TABLET | ORAL | Status: DC | PRN
  Administered 2019-01-12: 5 mg via ORAL
  Filled 2019-01-12: qty 1

## 2019-01-12 MED ORDER — ZOLPIDEM TARTRATE 5 MG PO TABS: 5.0000 mg | ORAL_TABLET | Freq: Every evening | ORAL | Status: DC | PRN

## 2019-01-12 MED ORDER — MORPHINE SULFATE (PF) 4 MG/ML IV SOLN: 4.0000 mg | INTRAVENOUS | Status: DC | PRN

## 2019-01-12 MED ORDER — GABAPENTIN 400 MG PO CAPS
400.0000 mg | ORAL_CAPSULE | Freq: Two times a day (BID) | ORAL | Status: DC
  Administered 2019-01-12 – 2019-01-13 (×2): 400 mg via ORAL
  Filled 2019-01-12 (×4): qty 1

## 2019-01-12 MED ORDER — PHENOL 1.4 % MT LIQD: 1.0000 | OROMUCOSAL | Status: DC | PRN

## 2019-01-12 MED ORDER — LACTATED RINGERS IV SOLN
INTRAVENOUS | Status: DC | PRN
  Administered 2019-01-12: 07:00:00 via INTRAVENOUS

## 2019-01-12 MED ORDER — CEFAZOLIN SODIUM-DEXTROSE 2-4 GM/100ML-% IV SOLN
2.0000 g | Freq: Three times a day (TID) | INTRAVENOUS | Status: AC
  Administered 2019-01-12 (×2): 2 g via INTRAVENOUS
  Filled 2019-01-12 (×2): qty 100

## 2019-01-12 MED ORDER — METFORMIN HCL ER 500 MG PO TB24
500.0000 mg | ORAL_TABLET | Freq: Every evening | ORAL | Status: DC
  Administered 2019-01-12 – 2019-01-13 (×2): 500 mg via ORAL
  Filled 2019-01-12 (×2): qty 1

## 2019-01-12 MED ORDER — FENTANYL CITRATE (PF) 250 MCG/5ML IJ SOLN
INTRAMUSCULAR | Status: DC | PRN
  Administered 2019-01-12 (×3): 50 ug via INTRAVENOUS

## 2019-01-12 MED ORDER — LISINOPRIL 10 MG PO TABS: 10.0000 mg | ORAL_TABLET | Freq: Every day | ORAL | Status: DC

## 2019-01-12 MED ORDER — AMLODIPINE BESYLATE 5 MG PO TABS: 5.0000 mg | ORAL_TABLET | Freq: Every day | ORAL | Status: DC

## 2019-01-12 MED ORDER — ALBUTEROL SULFATE (2.5 MG/3ML) 0.083% IN NEBU: 2.5000 mg | INHALATION_SOLUTION | Freq: Four times a day (QID) | RESPIRATORY_TRACT | Status: DC | PRN

## 2019-01-12 MED ORDER — ONDANSETRON HCL 4 MG/2ML IJ SOLN: 4.0000 mg | Freq: Four times a day (QID) | INTRAMUSCULAR | Status: DC | PRN

## 2019-01-12 MED ORDER — ACETAMINOPHEN 325 MG PO TABS
650.0000 mg | ORAL_TABLET | ORAL | Status: DC | PRN
  Administered 2019-01-13 (×2): 650 mg via ORAL
  Filled 2019-01-12 (×3): qty 2

## 2019-01-12 MED ORDER — LIDOCAINE 2% (20 MG/ML) 5 ML SYRINGE
INTRAMUSCULAR | Status: AC
  Filled 2019-01-12: qty 5

## 2019-01-12 MED ORDER — PHENYLEPHRINE 40 MCG/ML (10ML) SYRINGE FOR IV PUSH (FOR BLOOD PRESSURE SUPPORT)
PREFILLED_SYRINGE | INTRAVENOUS | Status: DC | PRN
  Administered 2019-01-12: 80 ug via INTRAVENOUS

## 2019-01-12 MED ORDER — FENTANYL CITRATE (PF) 250 MCG/5ML IJ SOLN
INTRAMUSCULAR | Status: AC
  Filled 2019-01-12: qty 5

## 2019-01-12 MED ORDER — LEVOTHYROXINE SODIUM 75 MCG PO TABS
37.5000 ug | ORAL_TABLET | Freq: Every day | ORAL | Status: DC
  Administered 2019-01-13 – 2019-01-14 (×2): 37.5 ug via ORAL
  Filled 2019-01-12 (×2): qty 1

## 2019-01-12 MED ORDER — THROMBIN 5000 UNITS EX SOLR
CUTANEOUS | Status: AC
  Filled 2019-01-12: qty 5000

## 2019-01-12 MED ORDER — ONDANSETRON HCL 4 MG/2ML IJ SOLN
INTRAMUSCULAR | Status: AC
  Filled 2019-01-12: qty 2

## 2019-01-12 MED ORDER — DULOXETINE HCL 30 MG PO CPEP
60.0000 mg | ORAL_CAPSULE | Freq: Two times a day (BID) | ORAL | Status: DC
  Administered 2019-01-12 – 2019-01-13 (×2): 60 mg via ORAL
  Filled 2019-01-12 (×4): qty 2

## 2019-01-12 MED ORDER — SODIUM CHLORIDE 0.9% FLUSH
3.0000 mL | Freq: Two times a day (BID) | INTRAVENOUS | Status: DC
  Administered 2019-01-12: 3 mL via INTRAVENOUS

## 2019-01-12 MED ORDER — BACITRACIN ZINC 500 UNIT/GM EX OINT
TOPICAL_OINTMENT | CUTANEOUS | Status: AC
  Filled 2019-01-12: qty 28.35

## 2019-01-12 MED ORDER — ROCURONIUM BROMIDE 10 MG/ML (PF) SYRINGE
PREFILLED_SYRINGE | INTRAVENOUS | Status: AC
  Filled 2019-01-12: qty 10

## 2019-01-12 MED ORDER — ACETAMINOPHEN 500 MG PO TABS
1000.0000 mg | ORAL_TABLET | Freq: Four times a day (QID) | ORAL | Status: AC
  Administered 2019-01-12 – 2019-01-13 (×4): 1000 mg via ORAL
  Filled 2019-01-12 (×4): qty 2

## 2019-01-12 MED ORDER — BUPIVACAINE-EPINEPHRINE (PF) 0.5% -1:200000 IJ SOLN
INTRAMUSCULAR | Status: DC | PRN
  Administered 2019-01-12: 10 mL

## 2019-01-12 MED ORDER — THROMBIN 5000 UNITS EX SOLR
OROMUCOSAL | Status: DC | PRN
  Administered 2019-01-12: 5 mL via TOPICAL

## 2019-01-12 MED ORDER — BUPIVACAINE LIPOSOME 1.3 % IJ SUSP
20.0000 mL | Freq: Once | INTRAMUSCULAR | Status: DC
  Filled 2019-01-12: qty 20

## 2019-01-12 MED ORDER — OXYCODONE HCL 5 MG/5ML PO SOLN: 5.0000 mg | Freq: Once | ORAL | Status: DC | PRN

## 2019-01-12 MED ORDER — SODIUM CHLORIDE 0.9 % IV SOLN
250.0000 mL | INTRAVENOUS | Status: DC
  Administered 2019-01-12: 250 mL via INTRAVENOUS

## 2019-01-12 MED ORDER — BUPROPION HCL ER (XL) 150 MG PO TB24
150.0000 mg | ORAL_TABLET | Freq: Every day | ORAL | Status: DC
  Filled 2019-01-12 (×2): qty 1

## 2019-01-12 MED ORDER — FLUTICASONE FUROATE-VILANTEROL 200-25 MCG/INH IN AEPB
1.0000 | INHALATION_SPRAY | Freq: Every day | RESPIRATORY_TRACT | Status: DC
  Administered 2019-01-13 – 2019-01-14 (×2): 1 via RESPIRATORY_TRACT
  Filled 2019-01-12: qty 28

## 2019-01-12 MED ORDER — SIMVASTATIN 20 MG PO TABS
20.0000 mg | ORAL_TABLET | Freq: Every evening | ORAL | Status: DC
  Administered 2019-01-13: 20 mg via ORAL
  Filled 2019-01-12: qty 1

## 2019-01-12 MED ORDER — PANTOPRAZOLE SODIUM 40 MG PO TBEC
40.0000 mg | DELAYED_RELEASE_TABLET | Freq: Every day | ORAL | Status: DC
  Filled 2019-01-12 (×3): qty 1

## 2019-01-12 MED ORDER — MENTHOL 3 MG MT LOZG: 1.0000 | LOZENGE | OROMUCOSAL | Status: DC | PRN

## 2019-01-12 MED ORDER — DEXAMETHASONE SODIUM PHOSPHATE 10 MG/ML IJ SOLN
INTRAMUSCULAR | Status: DC | PRN
  Administered 2019-01-12: 10 mg via INTRAVENOUS

## 2019-01-12 MED ORDER — MIDAZOLAM HCL 2 MG/2ML IJ SOLN
INTRAMUSCULAR | Status: AC
  Filled 2019-01-12: qty 2

## 2019-01-12 MED ORDER — OXYCODONE HCL 5 MG PO TABS: 5.0000 mg | ORAL_TABLET | Freq: Once | ORAL | Status: DC | PRN

## 2019-01-12 MED ORDER — 0.9 % SODIUM CHLORIDE (POUR BTL) OPTIME
TOPICAL | Status: DC | PRN
  Administered 2019-01-12: 1000 mL

## 2019-01-12 MED ORDER — SODIUM CHLORIDE 0.9 % IV SOLN
INTRAVENOUS | Status: DC | PRN
  Administered 2019-01-12: 500 mL

## 2019-01-12 MED ORDER — LIDOCAINE 2% (20 MG/ML) 5 ML SYRINGE
INTRAMUSCULAR | Status: DC | PRN
  Administered 2019-01-12: 60 mg via INTRAVENOUS

## 2019-01-12 MED ORDER — BUPIVACAINE LIPOSOME 1.3 % IJ SUSP
INTRAMUSCULAR | Status: DC | PRN
  Administered 2019-01-12: 20 mL

## 2019-01-12 MED ORDER — MIDAZOLAM HCL 5 MG/5ML IJ SOLN
INTRAMUSCULAR | Status: DC | PRN
  Administered 2019-01-12: 1 mg via INTRAVENOUS

## 2019-01-12 MED ORDER — MIRTAZAPINE 30 MG PO TABS
30.0000 mg | ORAL_TABLET | Freq: Every day | ORAL | Status: DC
  Administered 2019-01-12: 30 mg via ORAL
  Filled 2019-01-12 (×2): qty 1

## 2019-01-12 MED ORDER — TRAMADOL HCL 50 MG PO TABS
50.0000 mg | ORAL_TABLET | Freq: Every day | ORAL | Status: DC | PRN
  Administered 2019-01-13: 50 mg via ORAL
  Filled 2019-01-12: qty 1

## 2019-01-12 MED ORDER — DEXAMETHASONE SODIUM PHOSPHATE 10 MG/ML IJ SOLN
INTRAMUSCULAR | Status: AC
  Filled 2019-01-12: qty 1

## 2019-01-12 MED ORDER — TRAZODONE HCL 100 MG PO TABS
100.0000 mg | ORAL_TABLET | Freq: Every evening | ORAL | Status: DC | PRN
  Filled 2019-01-12: qty 1

## 2019-01-12 MED ORDER — ACETAMINOPHEN 650 MG RE SUPP: 650.0000 mg | RECTAL | Status: DC | PRN

## 2019-01-12 MED ORDER — SODIUM CHLORIDE 0.9 % IV SOLN
INTRAVENOUS | Status: DC | PRN
  Administered 2019-01-12: 30 ug/min via INTRAVENOUS

## 2019-01-12 MED ORDER — CYCLOBENZAPRINE HCL 10 MG PO TABS
10.0000 mg | ORAL_TABLET | Freq: Three times a day (TID) | ORAL | Status: DC | PRN
  Administered 2019-01-12 – 2019-01-13 (×2): 10 mg via ORAL
  Filled 2019-01-12 (×2): qty 1

## 2019-01-12 MED ORDER — PROPOFOL 10 MG/ML IV BOLUS
INTRAVENOUS | Status: DC | PRN
  Administered 2019-01-12: 130 mg via INTRAVENOUS

## 2019-01-12 MED ORDER — DOCUSATE SODIUM 100 MG PO CAPS
100.0000 mg | ORAL_CAPSULE | Freq: Two times a day (BID) | ORAL | Status: DC
  Administered 2019-01-12 – 2019-01-13 (×3): 100 mg via ORAL
  Filled 2019-01-12 (×4): qty 1

## 2019-01-12 MED ORDER — FENTANYL CITRATE (PF) 100 MCG/2ML IJ SOLN: 25.0000 ug | INTRAMUSCULAR | Status: DC | PRN

## 2019-01-12 MED ORDER — SUGAMMADEX SODIUM 200 MG/2ML IV SOLN
INTRAVENOUS | Status: DC | PRN
  Administered 2019-01-12: 150 mg via INTRAVENOUS

## 2019-01-12 SURGICAL SUPPLY — 67 items
BAG DECANTER FOR FLEXI CONT (MISCELLANEOUS) ×2 IMPLANT
BASKET BONE COLLECTION (BASKET) ×2 IMPLANT
BENZOIN TINCTURE PRP APPL 2/3 (GAUZE/BANDAGES/DRESSINGS) ×2 IMPLANT
BLADE CLIPPER SURG (BLADE) IMPLANT
BUR MATCHSTICK NEURO 3.0 LAGG (BURR) ×2 IMPLANT
BUR PRECISION FLUTE 6.0 (BURR) ×2 IMPLANT
CAGE ALTERA 10X31X9-13 15D (Cage) ×2 IMPLANT
CANISTER SUCT 3000ML PPV (MISCELLANEOUS) ×2 IMPLANT
CAP REVERE LOCKING (Cap) ×8 IMPLANT
CARTRIDGE OIL MAESTRO DRILL (MISCELLANEOUS) ×1 IMPLANT
CONT SPEC 4OZ CLIKSEAL STRL BL (MISCELLANEOUS) ×2 IMPLANT
COVER BACK TABLE 60X90IN (DRAPES) ×2 IMPLANT
COVER WAND RF STERILE (DRAPES) ×2 IMPLANT
DECANTER SPIKE VIAL GLASS SM (MISCELLANEOUS) ×2 IMPLANT
DIFFUSER DRILL AIR PNEUMATIC (MISCELLANEOUS) ×2 IMPLANT
DRAPE C-ARM 42X72 X-RAY (DRAPES) ×4 IMPLANT
DRAPE HALF SHEET 40X57 (DRAPES) ×2 IMPLANT
DRAPE LAPAROTOMY 100X72X124 (DRAPES) ×2 IMPLANT
DRAPE SURG 17X23 STRL (DRAPES) ×8 IMPLANT
DRSG OPSITE POSTOP 4X8 (GAUZE/BANDAGES/DRESSINGS) ×2 IMPLANT
ELECT BLADE 4.0 EZ CLEAN MEGAD (MISCELLANEOUS) ×2
ELECT REM PT RETURN 9FT ADLT (ELECTROSURGICAL) ×2
ELECTRODE BLDE 4.0 EZ CLN MEGD (MISCELLANEOUS) ×1 IMPLANT
ELECTRODE REM PT RTRN 9FT ADLT (ELECTROSURGICAL) ×1 IMPLANT
EVACUATOR 1/8 PVC DRAIN (DRAIN) IMPLANT
GAUZE 4X4 16PLY RFD (DISPOSABLE) ×2 IMPLANT
GAUZE SPONGE 4X4 12PLY STRL (GAUZE/BANDAGES/DRESSINGS) ×2 IMPLANT
GLOVE BIO SURGEON STRL SZ8 (GLOVE) IMPLANT
GLOVE BIO SURGEON STRL SZ8.5 (GLOVE) IMPLANT
GLOVE EXAM NITRILE XL STR (GLOVE) IMPLANT
GLOVE SS N UNI LF 6.5 STRL (GLOVE) ×2 IMPLANT
GLOVE SS N UNI LF 8.0 STRL (GLOVE) ×4 IMPLANT
GLOVE SS N UNI LF 8.5 STRL (GLOVE) ×4 IMPLANT
GOWN STRL REUS W/ TWL LRG LVL3 (GOWN DISPOSABLE) IMPLANT
GOWN STRL REUS W/ TWL XL LVL3 (GOWN DISPOSABLE) ×3 IMPLANT
GOWN STRL REUS W/TWL 2XL LVL3 (GOWN DISPOSABLE) IMPLANT
GOWN STRL REUS W/TWL LRG LVL3 (GOWN DISPOSABLE)
GOWN STRL REUS W/TWL XL LVL3 (GOWN DISPOSABLE) ×3
HEMOSTAT POWDER KIT SURGIFOAM (HEMOSTASIS) ×2 IMPLANT
KIT BASIN OR (CUSTOM PROCEDURE TRAY) ×2 IMPLANT
KIT TURNOVER KIT B (KITS) ×2 IMPLANT
MILL MEDIUM DISP (BLADE) ×2 IMPLANT
NEEDLE HYPO 21X1.5 SAFETY (NEEDLE) ×2 IMPLANT
NEEDLE HYPO 22GX1.5 SAFETY (NEEDLE) ×2 IMPLANT
NS IRRIG 1000ML POUR BTL (IV SOLUTION) ×2 IMPLANT
OIL CARTRIDGE MAESTRO DRILL (MISCELLANEOUS) ×2
PACK LAMINECTOMY NEURO (CUSTOM PROCEDURE TRAY) ×2 IMPLANT
PAD ARMBOARD 7.5X6 YLW CONV (MISCELLANEOUS) ×6 IMPLANT
PATTIES SURGICAL .5 X1 (DISPOSABLE) IMPLANT
PUTTY DBM 10CC CALC GRAN (Putty) ×2 IMPLANT
ROD REVERE 6.35 40MM (Rod) ×4 IMPLANT
SCREW 7.5X45MM (Screw) ×6 IMPLANT
SCREW 7.5X50MM (Screw) ×2 IMPLANT
SPONGE LAP 4X18 RFD (DISPOSABLE) IMPLANT
SPONGE NEURO XRAY DETECT 1X3 (DISPOSABLE) IMPLANT
SPONGE SURGIFOAM ABS GEL 100 (HEMOSTASIS) IMPLANT
STRIP CLOSURE SKIN 1/2X4 (GAUZE/BANDAGES/DRESSINGS) ×2 IMPLANT
SUT VIC AB 1 CT1 18XBRD ANBCTR (SUTURE) ×2 IMPLANT
SUT VIC AB 1 CT1 8-18 (SUTURE) ×2
SUT VIC AB 2-0 CP2 18 (SUTURE) ×4 IMPLANT
SYR 20ML LL LF (SYRINGE) ×2 IMPLANT
TOWEL GREEN STERILE (TOWEL DISPOSABLE) ×2 IMPLANT
TOWEL GREEN STERILE FF (TOWEL DISPOSABLE) ×2 IMPLANT
TRAY FOL W/BAG SLVR 16FR STRL (SET/KITS/TRAYS/PACK) ×1 IMPLANT
TRAY FOLEY MTR SLVR 16FR STAT (SET/KITS/TRAYS/PACK) IMPLANT
TRAY FOLEY W/BAG SLVR 16FR LF (SET/KITS/TRAYS/PACK) ×1
WATER STERILE IRR 1000ML POUR (IV SOLUTION) ×2 IMPLANT

## 2019-01-12 NOTE — Evaluation (Signed)
Physical Therapy Evaluation Patient Details Name: Heather Espinoza MRN: [id number] DOB: 1946-02-16 Today's Date: 01/12/2019   History of Present Illness  73yo female s/p posterior lumbar fusion and instrumentation at L4-L5 done 01/12/19. PMH essential tremor, RLS, IBS, HTN, HLD, fibromyalgia, DM, COPD, hx R elbow surgery  Clinical Impression   Patient received in bed, very sleepy but able to follow commands and willing to participate in session today; per chart review, HR and BP both low- RN reports patient still OK to see as per patient/family these numbers are close to her normal. Able to complete bed mobility S-min guard to get to EOB, lumbar brace applied totalA, and able to perform functional transfers with RW and min guard. Able to maintain balance and precautions while adjusting briefs with no UE support momentarily. Due to recent low BP/HR, PT limited gait distance at eval to approximately 46f, able to perform with RW and min guard increasing to MinA for safe use of RW today; she then required MinA for return to sidelying in bed. Left in bed with all needs met and husband present. Currently recommending skilled HHPT services moving forward.     Follow Up Recommendations Home health PT;Supervision/Assistance - 24 hour    Equipment Recommendations  Rolling walker with 5" wheels;3in1 (PT)    Recommendations for Other Services       Precautions / Restrictions Precautions Precautions: Fall;Back Precaution Booklet Issued: Yes (comment) Precaution Comments: verbally reviewed Required Braces or Orthoses: Spinal Brace Spinal Brace: Lumbar corset;Applied in sitting position Restrictions Weight Bearing Restrictions: No      Mobility  Bed Mobility Overal bed mobility: Needs Assistance Bed Mobility: Rolling;Sidelying to Sit;Sit to Sidelying Rolling: Supervision Sidelying to sit: Min guard     Sit to sidelying: Min assist General bed mobility comments: MinA for LE management getting  back into bed  Transfers Overall transfer level: Needs assistance Equipment used: Rolling walker (2 wheeled) Transfers: Sit to/from Stand Sit to Stand: Min guard         General transfer comment: cues for safety and to maintain lumbar precautions  Ambulation/Gait Ambulation/Gait assistance: Min guard;Min assist Gait Distance (Feet): 40 Feet Assistive device: Rolling walker (2 wheeled) Gait Pattern/deviations: Step-through pattern;Decreased step length - right;Decreased step length - left;Decreased stride length;Decreased dorsiflexion - left;Decreased dorsiflexion - right Gait velocity: decreased   General Gait Details: cues and min guard-MinA for safe use of RW as she tends to push it too far ahead of herself, cues to maintain lumbar precautions especially no twisting during gait. Gait distance limited due to hx of low BP today and dizziness during session  Stairs            Wheelchair Mobility    Modified Rankin (Stroke Patients Only)       Balance Overall balance assessment: Needs assistance Sitting-balance support: Bilateral upper extremity supported;Feet supported Sitting balance-Leahy Scale: Good     Standing balance support: Bilateral upper extremity supported;During functional activity Standing balance-Leahy Scale: Fair Standing balance comment: reliant on B UE support                             Pertinent Vitals/Pain Pain Assessment: 0-10 Pain Score: 6  Pain Location: back Pain Descriptors / Indicators: Aching;Sore Pain Intervention(s): Limited activity within patient's tolerance;Monitored during session    [place] residence Living Arrangements: Spouse/significant other Available Help at Discharge: Family;Available 24 hours/day Type of Home: House  Home Access: Stairs to enter Entrance Stairs-Rails: Can reach both Entrance Stairs-Number of Steps: 8 Home Layout: Multi-level Home  Equipment: None      Prior Function Level of Independence: Independent         Comments: drives, grocery shops, most days she is at home per her report     Hand Dominance        Extremity/Trunk Assessment   Upper Extremity Assessment Upper Extremity Assessment: Defer to OT evaluation    Lower Extremity Assessment Lower Extremity Assessment: Generalized weakness    Cervical / Trunk Assessment Cervical / Trunk Assessment: Kyphotic  Communication   Communication: No difficulties  Cognition Arousal/Alertness: Awake/alert Behavior During Therapy: Flat affect Overall Cognitive Status: Within Functional Limits for tasks assessed                                 General Comments: very sleepy today, flat affect, difficulty remembering and applying lumbar precautions      General Comments General comments (skin integrity, edema, etc.): BP and HR low, RN aware and reports OK to see patient regardless- limited session due to vitals    Exercises     Assessment/Plan    PT Assessment Patient needs continued PT services  PT Problem List Decreased strength;Obesity;Decreased knowledge of use of DME;Decreased activity tolerance;Decreased safety awareness;Decreased balance;Decreased knowledge of precautions;Pain;Decreased mobility;Decreased coordination       PT Treatment Interventions DME instruction;Balance training;Gait training;Neuromuscular re-education;Stair training;Functional mobility training;Patient/family education;Therapeutic activities;Therapeutic exercise    PT Goals (Current goals can be found in the Care Plan section)  Acute Rehab PT Goals Patient Stated Goal: less pain PT Goal Formulation: With patient Time For Goal Achievement: 01/26/19 Potential to Achieve Goals: Good    Frequency Min 5X/week   Barriers to discharge        Co-evaluation               AM-PAC PT "6 Clicks" Mobility  Outcome Measure Help needed turning from your  back to your side while in a flat bed without using bedrails?: A Little Help needed moving from lying on your back to sitting on the side of a flat bed without using bedrails?: A Little Help needed moving to and from a bed to a chair (including a wheelchair)?: A Little Help needed standing up from a chair using your arms (e.g., wheelchair or bedside chair)?: A Little Help needed to walk in hospital room?: A Little Help needed climbing 3-5 steps with a railing? : A Lot 6 Click Score: 17    End of Session Equipment Utilized During Treatment: Back brace Activity Tolerance: Patient tolerated treatment well;Other (comment)(limited by dizziness) Patient left: in bed;with call bell/phone within reach;with family/visitor present   PT Visit Diagnosis: Unsteadiness on feet (R26.81);Difficulty in walking, not elsewhere classified (R26.2);Pain;Muscle weakness (generalized) (M62.81) Pain - Right/Left: (back pain) Pain - part of body: (back pain)    Time: 1405-1430 PT Time Calculation (min) (ACUTE ONLY): 25 min   Charges:   PT Evaluation $PT Eval Low Complexity: 1 Low PT Treatments $Gait Training: 8-22 mins        Deniece Ree PT, DPT, CBIS  Supplemental Physical Therapist [place]    Pager [phone] Acute Rehab Office [phone]

## 2019-01-12 NOTE — TOC Initial Note (Signed)
Transition of Care [facility]) - Initial/Assessment Note    Patient Details  Name: Heather Espinoza MRN: [id number] Date of Birth: [birth year]  [facility] CM/SW Contact:    Benard Halsted, LCSW Phone Number: 01/12/2019, 5:26 PM  Clinical Narrative:                 CSW received consult for possible home health services at time of discharge. CSW spoke with patient regarding PT recommendation of Home Health PT/OT at time of discharge if patient's MD is in agreement. Patient reported that she would like home health services.  Patient reported that she has never used home health before, but would like the agency with the best ratings. CSW will send referral for review at [facility] in [place] once [place] PT/OT and Face to Face orders are placed. CSW provided Medicare [facility] ratings list. CSW confirmed PCP and address with patient. Patient states her husband will come pick her up at discharge. No further questions reported at this time. CSW to continue to follow and assist with discharge planning needs.   Expected Discharge Plan: [place] Barriers to Discharge: Continued Medical Work up   Patient Goals and CMS Choice Patient states their goals for this hospitalization and ongoing recovery are:: Get stronger CMS Medicare.gov Compare Post Acute Care list provided to:: Patient Choice offered to / list presented to : Patient  Expected Discharge Plan and Services Expected Discharge Plan: [place] In-house Referral: NA Discharge Planning Services: CM Consult Post Acute Care Choice: [place] arrangements for the past 2 months: Single Family Home                           HH Arranged: PT, OT HH Agency: [facility] ([place], [place])        Prior Living Arrangements/Services Living arrangements for the past 2 months: Single Family Home Lives with:: Spouse Patient language and need for interpreter reviewed::  Yes Do you feel safe going back to the place where you live?: Yes      Need for Family Participation in Patient Care: No (Comment) Care giver support system in place?: Yes (comment)   Criminal Activity/Legal Involvement Pertinent to Current Situation/Hospitalization: No - Comment as needed  Activities of Daily Living      Permission Sought/Granted Permission sought to share information with : Facility Art therapist granted to share information with : Yes, Verbal Permission Granted     Permission granted to share info w AGENCY: Home Health        Emotional Assessment Appearance:: Appears stated age Attitude/Demeanor/Rapport: Engaged, Gracious Affect (typically observed): Accepting, Appropriate, Pleasant Orientation: : Oriented to Self, Oriented to Place, Oriented to  Time, Oriented to Situation Alcohol / Substance Use: Not Applicable Psych Involvement: No (comment)  Admission diagnosis:  SPONDYLOLISTHESIS, LUMBAR REGION Patient Active Problem List   Diagnosis Date Noted  . Spondylolisthesis, lumbar region 01/12/2019  . Tachycardia 01/07/2019  . Syncope and collapse 01/07/2019  . Palpitations 07/01/2018  . SOB (shortness of breath) 07/01/2018  . Educated about COVID-19 virus infection 07/01/2018  . Lobar pneumonia ([place]) 04/10/2018  . Streptococcus pneumoniae pneumonia ([place]) 04/10/2018  . Prolonged QT interval 04/10/2018  . Tremor, essential 01/07/2018  . Other dysphagia 12/21/2016  . Primary osteoarthritis of first carpometacarpal joint of left hand 12/11/2016  . Status post wrist surgery 12/11/2016  . Chronic low back pain 10/07/2015  .  IBS (irritable bowel syndrome) 02/17/2013  . GERD (gastroesophageal reflux disease) 02/17/2013  . Depression 02/17/2013  . Family hx of colon cancer 11/24/2012  . Essential hypertension, benign 11/24/2012  . High cholesterol 11/24/2012  . Diabetes ([place]) 11/24/2012  . Fibromyalgia 11/24/2012  . Unspecified  hypothyroidism 11/24/2012   PCP:  London Pepper, MD Pharmacy:   [place], [place] - [address] [address] [place] [address] Phone: [phone] Fax: [phone]  [facility] [address], [place] [id number] [place] [id number] Phone: [phone] Fax: [phone]     Social Determinants of Health (SDOH) Interventions    Readmission Risk Interventions No flowsheet data found.

## 2019-01-12 NOTE — Transfer of Care (Signed)
Immediate Anesthesia Transfer of Care Note  Patient: Heather Espinoza  Procedure(s) Performed: POSTERIOR LUMBAR INTERBODY FUSION, POSTERIOR INSTRUMENTATION LUMBAR FOUR- LUMBAR FIVE (N/A )  Patient Location: PACU  Anesthesia Type:General  Level of Consciousness: awake, alert , oriented and patient cooperative  Airway & Oxygen Therapy: Patient Spontanous Breathing and Patient connected to nasal cannula oxygen  Post-op Assessment: Report given to RN, Post -op Vital signs reviewed and stable and Patient moving all extremities X 4  Post vital signs: Reviewed and stable  Last Vitals:  Vitals Value Taken Time  BP    Temp    Pulse 59 01/12/19 1059  Resp 22 01/12/19 1059  SpO2 100 % 01/12/19 1059  Vitals shown include unvalidated device data.  Last Pain:  Vitals:   01/12/19 0701  TempSrc:   PainSc: 4       Patients Stated Pain Goal: 3 ([id number])  Complications: No apparent anesthesia complications

## 2019-01-12 NOTE — Progress Notes (Signed)
Subjective: The patient is alert and pleasant.  She looks well.  Her husband is at the bedside.  Objective: Vital signs in last 24 hours: Temp:  [97.3 F (36.3 C)-97.6 F (36.4 C)] 97.6 F (36.4 C) (10/26 1151) Pulse Rate:  [49-61] 49 (10/26 1151) Resp:  [13-20] 18 (10/26 1151) BP: (90-107)/(43-53) 107/50 (10/26 1151) SpO2:  [96 %-100 %] 97 % (10/26 1151) Weight:  [63.5 kg] 63.5 kg (10/26 0636) Estimated body mass index is 26.02 kg/m as calculated from the following:   Height as of this encounter: 5' 1.5" (1.562 m).   Weight as of this encounter: 63.5 kg.   Intake/Output from previous day: No intake/output data recorded. Intake/Output this shift: Total I/O In: 1190 [P.O.:340; I.V.:750; IV Piggyback:100] Out: 325 [Urine:225; Blood:100]  Physical exam the patient is alert and pleasant.  She is moving her lower extremities well.  Lab Results: No results for input(s): WBC, HGB, HCT, PLT in the last 72 hours. BMET No results for input(s): NA, K, CL, CO2, GLUCOSE, BUN, CREATININE, CALCIUM in the last 72 hours.  Studies/Results: Dg Lumbar Spine 2-3 Views  Result Date: 01/12/2019 CLINICAL DATA:  L4-L5 PLIF. EXAM: LUMBAR SPINE - 2-3 VIEW; DG C-ARM 1-60 MIN COMPARISON:  MRI lumbar spine dated October 20, 2018. FINDINGS: Frontal and lateral intraoperative fluoroscopic images demonstrate interval L4-L5 PLIF. Connecting rods have not yet been placed. Trace L4-L5 and L5-S1 anterolisthesis is unchanged. No acute osseous abnormality. IMPRESSION: Intraoperative fluoroscopic guidance for L4-L5 PLIF. Electronically Signed   By: Titus Dubin M.D.   On: 01/12/2019 10:27   Dg Spine Portable 1 View  Result Date: 01/12/2019 CLINICAL DATA:  L4-5 posterior fusion. EXAM: PORTABLE SPINE - 1 VIEW COMPARISON:  Radiographs of August 11th 2020. FINDINGS: Single intraoperative cross-table lateral projection of the lumbar spine demonstrates a surgical probe at approximately the L4-5 level. IMPRESSION:  Surgical localization as described above. Electronically Signed   By: Marijo Conception M.D.   On: 01/12/2019 08:36   Dg C-arm 1-60 Min  Result Date: 01/12/2019 CLINICAL DATA:  L4-L5 PLIF. EXAM: LUMBAR SPINE - 2-3 VIEW; DG C-ARM 1-60 MIN COMPARISON:  MRI lumbar spine dated October 20, 2018. FINDINGS: Frontal and lateral intraoperative fluoroscopic images demonstrate interval L4-L5 PLIF. Connecting rods have not yet been placed. Trace L4-L5 and L5-S1 anterolisthesis is unchanged. No acute osseous abnormality. IMPRESSION: Intraoperative fluoroscopic guidance for L4-L5 PLIF. Electronically Signed   By: Titus Dubin M.D.   On: 01/12/2019 10:27    Assessment/Plan: The patient is doing well.  LOS: 0 days     Ophelia Charter 01/12/2019, 3:34 PM

## 2019-01-12 NOTE — Anesthesia Procedure Notes (Signed)
Procedure Name: Intubation Date/Time: 01/12/2019 7:41 AM Performed by: Colin Benton, CRNA Pre-anesthesia Checklist: Patient identified, Emergency Drugs available, Suction available and Patient being monitored Patient Re-evaluated:Patient Re-evaluated prior to induction Oxygen Delivery Method: Circle system utilized Preoxygenation: Pre-oxygenation with 100% oxygen Induction Type: IV induction Ventilation: Mask ventilation without difficulty Laryngoscope Size: Miller and 2 Grade View: Grade I Tube type: Oral Tube size: 7.0 mm Number of attempts: 1 Airway Equipment and Method: Stylet Placement Confirmation: ETT inserted through vocal cords under direct vision,  positive ETCO2 and breath sounds checked- equal and bilateral Secured at: 21 cm Tube secured with: Tape Dental Injury: Teeth and Oropharynx as per pre-operative assessment

## 2019-01-12 NOTE — Op Note (Addendum)
Brief history: The patient is a 73 year old white female who has complained of back and leg pain consistent with neurogenic claudication.  She has failed medical management and was worked up with a lumbar MRI and lumbar x-rays.  This demonstrated an L4-5 spondylolisthesis and facet arthropathy with spinal stenosis.  I discussed the various treatment options with the patient including surgery.  She has weighed the risks, benefits and alternatives surgery and decided proceed with an L4-5 decompression, instrumentation and fusion.  Preoperative diagnosis: L4-5 spondylolisthesis, facet arthropathy, degenerative disc disease, spinal stenosis compressing both the L4 and the L5 nerve roots; lumbago; lumbar radiculopathy; neurogenic claudication  Postoperative diagnosis: The same  Procedure: Bilateral L4-5 laminotomy/foraminotomies/medial facetectomy to decompress the bilateral L4 and L5 nerve roots(the work required to do this was in addition to the work required to do the posterior lumbar interbody fusion because of the patient's spinal stenosis, facet arthropathy. Etc. requiring a wide decompression of the nerve roots.);  L4-5 transforaminal lumbar interbody fusion with local morselized autograft bone and Zimmer DBM; insertion of interbody prosthesis at L4-5 (globus peek expandable interbody prosthesis); posterior nonsegmental instrumentation from L4 to L5 with globus titanium pedicle screws and rods; posterior lateral arthrodesis at L4-5 with local morselized autograft bone and Zimmer DBM.  Surgeon: Dr. Earle Gell  Asst.: Arnetha Massy nurse practitioner  Anesthesia: Gen. endotracheal  Estimated blood loss: 200 cc  Drains: None  Complications: None  Description of procedure: The patient was brought to the operating room by the anesthesia team. General endotracheal anesthesia was induced. The patient was turned to the prone position on the Wilson frame. The patient's lumbosacral region was then  prepared with Betadine scrub and Betadine solution. Sterile drapes were applied.  I then injected the area to be incised with Marcaine with epinephrine solution. I then used the scalpel to make a linear midline incision over the L4-5 interspace. I then used electrocautery to perform a bilateral subperiosteal dissection exposing the spinous process and lamina of L4 and L5. We then obtained intraoperative radiograph to confirm our location. We then inserted the Verstrac retractor to provide exposure.  I began the decompression by using the high speed drill to perform laminotomies at L4-5 bilaterally. We then used the Kerrison punches to widen the laminotomy and removed the ligamentum flavum at L4-5 bilaterally. We used the Kerrison punches to remove the medial facets at L4-5 bilaterally, we performed a facetectomy on the left. We performed wide foraminotomies about the bilateral L4 and L5 nerve roots completing the decompression.  During the decompression the Assistant protected the thecal sac and nerve roots with a nerve root retractor.  We now turned our attention to the posterior lumbar interbody fusion. I used a scalpel to incise the intervertebral disc at L4-5 bilaterally. I then performed a partial intervertebral discectomy at L4-5 bilaterally using the pituitary forceps. We prepared the vertebral endplates at 075-GRM bilaterally for the fusion by removing the soft tissues with the curettes. We then used the trial spacers to pick the appropriate sized interbody prosthesis. We prefilled his prosthesis with a combination of local morselized autograft bone that we obtained during the decompression as well as Zimmer DBM. We inserted the prefilled prosthesis into the interspace at L4-5 from the left, we then turned and expanded the prosthesis. There was a good snug fit of the prosthesis in the interspace. We then filled and the remainder of the intervertebral disc space with local morselized autograft bone and  Zimmer DBM. This completed the posterior lumbar interbody  arthrodesis.  We now turned attention to the instrumentation. Under fluoroscopic guidance we cannulated the bilateral L4 and L5 pedicles with the bone probe. We then removed the bone probe. We then tapped the pedicle with a 6.5 millimeter tap. We then removed the tap. We probed inside the tapped pedicle with a ball probe to rule out cortical breaches. We then inserted a 7.5 x 45 and 50 millimeter pedicle screw into the L4 and L5 pedicles bilaterally under fluoroscopic guidance. We then palpated along the medial aspect of the pedicles to rule out cortical breaches. There were none. The nerve roots were not injured. We then connected the unilateral pedicle screws with a lordotic rod. We compressed the construct and secured the rod in place with the caps. We then tightened the caps appropriately. This completed the instrumentation from L4-5 bilaterally.  We now turned our attention to the posterior lateral arthrodesis at L4-5. We used the high-speed drill to decorticate the remainder of the facets, pars, transverse process at L4-5. We then applied a combination of local morselized autograft bone and Zimmer DBM over these decorticated posterior lateral structures. This completed the posterior lateral arthrodesis.  We then obtained hemostasis using bipolar electrocautery. We irrigated the wound out with bacitracin solution. We inspected the thecal sac and nerve roots and noted they were well decompressed. We then removed the retractor.  We injected Exparel . We reapproximated patient's thoracolumbar fascia with interrupted #1 Vicryl suture. We reapproximated patient's subcutaneous tissue with interrupted 2-0 Vicryl suture. The reapproximated patient's skin with Steri-Strips and benzoin. The wound was then coated with bacitracin ointment. A sterile dressing was applied. The drapes were removed. The patient was subsequently returned to the supine position  where they were extubated by the anesthesia team. He was then transported to the post anesthesia care unit in stable condition. All sponge instrument and needle counts were reportedly correct at the end of this case.

## 2019-01-12 NOTE — H&P (Signed)
Subjective: The patient is a 73 year old white female who has complained of back and leg pain consistent with neurogenic claudication.  She has failed medical management.  She was worked up with lumbar x-rays and lumbar MRI which demonstrated a L4-5 spinal listhesis, facet arthropathy and spinal stenosis.  We discussed the various treatment options.  She has decided to proceed with surgery.  Past Medical History:  Diagnosis Date  . Arthritis   . COPD (chronic obstructive pulmonary disease) ([place])   . Diabetes ([place])   . Diverticulitis   . Diverticulosis   . Dyspnea   . Fibromyalgia   . High cholesterol   . Hypertension   . Hypothyroid   . IBS (irritable bowel syndrome)   . Kidney stone    Left Kidney  . Osteopenia   . Pneumonia   . Restless leg   . Sleep apnea    Doesn't use CPAP.    Marland Kitchen Tachycardia    per pt/fim  . Tremor, essential 01/07/2018    Past Surgical History:  Procedure Laterality Date  . APPENDECTOMY    . BIOPSY  12/25/2016   Procedure: BIOPSY;  Surgeon: Rogene Houston, MD;  [facility]: [facility];  Service: Endoscopy;;  gastric   . BREAST LUMPECTOMY Right 2001  . CATARACT EXTRACTION Bilateral 2016  . CHOLECYSTECTOMY    . COLONOSCOPY N/A 12/17/2012   Procedure: COLONOSCOPY;  Surgeon: Rogene Houston, MD;  Location: [facility];  Service: Endoscopy;  Laterality: N/A;  215  . complete hysterectomy    . ESOPHAGEAL DILATION N/A 12/25/2016   Procedure: ESOPHAGEAL DILATION;  Surgeon: Rogene Houston, MD;  [facility]: [facility];  Service: Endoscopy;  Laterality: N/A;  . ESOPHAGOGASTRODUODENOSCOPY N/A 12/25/2016   Procedure: ESOPHAGOGASTRODUODENOSCOPY (EGD);  Surgeon: Rogene Houston, MD;  [facility]: [facility];  Service: Endoscopy;  Laterality: N/A;  730  . Heel tumor removed    . rt elbow surgery    . TONSILLECTOMY      Allergies  Allergen Reactions  . Flagyl [Metronidazole] Shortness Of Breath and Swelling  . Lyrica [Pregabalin] Shortness Of Breath  .  Adhesive [Tape] Other (See Comments)    Takes off patient's skin  . Aspirin Other (See Comments)    Stomach bleed  . Latex Itching and Rash  . Phenergan [Promethazine Hcl] Other (See Comments)    Caused grogginess, altered mental status  . Prednisone Anxiety  . Sulfa Antibiotics Nausea Only  . Tetanus Toxoids Swelling    Arm Area    Social History   Tobacco Use  . Smoking status: Never Smoker  . Smokeless tobacco: Never Used  Substance Use Topics  . Alcohol use: No    Alcohol/week: 0.0 standard drinks    Family History  Problem Relation Age of Onset  . Uterine cancer Mother   . Parkinson's disease Father   . Stroke Father   . Colon cancer Brother   . Lung cancer Brother   . COPD Sister   . Diabetes Grandchild   . Asthma Son    Prior to Admission medications   Medication Sig Start Date End Date Taking? Authorizing Provider  amLODipine (NORVASC) 5 MG tablet Take 1 tablet (5 mg total) by mouth daily. 01/08/19  Yes Minus Breeding, MD  aspirin 81 MG chewable tablet Chew 81 mg by mouth daily.   Yes [provider]  Biotin 10 MG TABS Take 10 mg by mouth daily.    Yes [provider]  bisacodyl (DULCOLAX) 10  MG suppository Place 1 suppository (10 mg total) rectally as needed for moderate constipation. 01/04/15  Yes Rehman, Mechele Dawley, MD  buPROPion (WELLBUTRIN XL) 150 MG 24 hr tablet Take 150 mg by mouth daily.  04/08/18  Yes [provider]  cholecalciferol (VITAMIN D) 1000 units tablet Take 1,000 Units by mouth daily.    Yes [provider]  Cyanocobalamin (B-12) 2500 MCG SUBL Place 2,500 mcg under the tongue daily.   Yes [provider]  dicyclomine (BENTYL) 10 MG capsule TAKE 1 CAPSULE BY MOUTH TWICE DAILY AS NEEDED FOR  SPASMS Patient taking differently: Take 20 mg by mouth 3 (three) times daily as needed for spasms.  12/04/18  Yes Noralyn Pick, NP  docusate sodium (COLACE) 100 MG capsule Take 2 capsules (200 mg total) by  mouth at bedtime. Patient taking differently: Take 200 mg by mouth daily.  01/04/15  Yes Rehman, Mechele Dawley, MD  DULoxetine (CYMBALTA) 60 MG capsule Take 1 capsule by mouth twice daily Patient taking differently: Take 60 mg by mouth 2 (two) times daily.  10/06/18  Yes Kathrynn Ducking, MD  esomeprazole (NEXIUM) 20 MG capsule Take 40 mg by mouth 2 (two) times daily before a meal.    Yes [provider]  esomeprazole (NEXIUM) 40 MG capsule Take 1 capsule (40 mg total) by mouth daily before breakfast. 07/15/18  Yes Rehman, Mechele Dawley, MD  famotidine (PEPCID) 40 MG tablet TAKE 1 TABLET BY MOUTH AT BEDTIME Patient taking differently: Take 40 mg by mouth at bedtime.  12/04/18  Yes Noralyn Pick, NP  Fluticasone-Salmeterol (ADVAIR) 250-50 MCG/DOSE AEPB Inhale 1 puff into the lungs every 12 (twelve) hours.   Yes [provider]  gabapentin (NEURONTIN) 400 MG capsule Take 400 mg by mouth 2 (two) times daily.  06/30/18  Yes [provider]  GELATIN PO Take 1 capsule by mouth daily. After a meal   Yes [provider]  levothyroxine (SYNTHROID, LEVOTHROID) 75 MCG tablet Take 37.5 mcg by mouth daily before breakfast.    Yes [provider]  lisinopril (PRINIVIL,ZESTRIL) 10 MG tablet Take 10 mg by mouth daily.    Yes [provider]  lubiprostone (AMITIZA) 8 MCG capsule Take 2 capsules (16 mcg total) by mouth daily with breakfast. Patient taking differently: Take 8 mcg by mouth 2 (two) times daily as needed for constipation.  12/04/16  Yes Rehman, Mechele Dawley, MD  metformin (FORTAMET) 500 MG (OSM) 24 hr tablet Take 500 mg by mouth every evening.    Yes [provider]  metoprolol succinate (TOPROL-XL) 25 MG 24 hr tablet Take 12.5 mg by mouth daily.  04/08/18  Yes [provider]  ondansetron (ZOFRAN) 4 MG tablet TAKE 1 TABLET BY MOUTH TWICE DAILY AS NEEDED FOR NAUSEA FOR VOMITING Patient taking differently: Take 4 mg by mouth every 6 (six)  hours as needed for nausea.  12/31/18  Yes Rehman, Mechele Dawley, MD  propranolol (INDERAL) 20 MG tablet Take 1 tablet (20 mg total) by mouth 2 (two) times daily. 07/28/18  Yes Kathrynn Ducking, MD  simvastatin (ZOCOR) 20 MG tablet Take 20 mg by mouth every evening.   Yes [provider]  traMADol (ULTRAM) 50 MG tablet Take 50 mg by mouth daily as needed for moderate pain.  10/19/16  Yes [provider]  traZODone (DESYREL) 100 MG tablet Take 100 mg by mouth at bedtime as needed for sleep.    Yes [provider]  vitamin E  400 UNIT capsule Take 400 Units by mouth daily.   Yes [provider]  albuterol (PROVENTIL HFA;VENTOLIN HFA) 108 (90 Base) MCG/ACT inhaler Inhale 2 puffs into the lungs every 6 (six) hours as needed for wheezing or shortness of breath.    [provider]  albuterol (PROVENTIL) (2.5 MG/3ML) 0.083% nebulizer solution Take 2.5 mg by nebulization every 6 (six) hours as needed for wheezing or shortness of breath.    [provider]  mirtazapine (REMERON) 30 MG tablet Take 30 mg by mouth at bedtime.  06/06/18   [provider]     Review of Systems  Positive ROS: As above  All other systems have been reviewed and were otherwise negative with the exception of those mentioned in the HPI and as above.  Objective: Vital signs in last 24 hours: Temp:  [97.5 F (36.4 C)] 97.5 F (36.4 C) (10/26 0636) Pulse Rate:  [52] 52 (10/26 0636) Resp:  [20] 20 (10/26 0636) BP: (95)/(43) 95/43 (10/26 0636) SpO2:  [98 %] 98 % (10/26 0636) Weight:  [63.5 kg] 63.5 kg (10/26 0636) Estimated body mass index is 26.02 kg/m as calculated from the following:   Height as of this encounter: 5' 1.5" (1.562 m).   Weight as of this encounter: 63.5 kg.   General Appearance: Alert Head: Normocephalic, without obvious abnormality, atraumatic Eyes: PERRL, conjunctiva/corneas clear, EOM's intact,    Ears: Normal  Throat: Normal  Neck:  Supple, Back: unremarkable Lungs: Clear to auscultation bilaterally, respirations unlabored Heart: Regular rate and rhythm, no murmur, rub or gallop Abdomen: Soft, non-tender Extremities: Extremities normal, atraumatic, no cyanosis or edema Skin: unremarkable  NEUROLOGIC:   Mental status: alert and oriented,Motor Exam - grossly normal Sensory Exam - grossly normal Reflexes:  Coordination - grossly normal Gait - grossly normal Balance - grossly normal Cranial Nerves: I: smell Not tested  II: visual acuity  OS: Normal  OD: Normal   II: visual fields Full to confrontation  II: pupils Equal, round, reactive to light  III,VII: ptosis None  III,IV,VI: extraocular muscles  Full ROM  V: mastication Normal  V: facial light touch sensation  Normal  V,VII: corneal reflex  Present  VII: facial muscle function - upper  Normal  VII: facial muscle function - lower Normal  VIII: hearing Not tested  IX: soft palate elevation  Normal  IX,X: gag reflex Present  XI: trapezius strength  5/5  XI: sternocleidomastoid strength 5/5  XI: neck flexion strength  5/5  XII: tongue strength  Normal    Data Review Lab Results  Component Value Date   WBC 7.5 01/08/2019   HGB 10.7 (L) 01/08/2019   HCT 33.5 (L) 01/08/2019   MCV 96.3 01/08/2019   PLT 333 01/08/2019   Lab Results  Component Value Date   NA 140 01/08/2019   K 4.9 01/08/2019   CL 103 01/08/2019   CO2 26 01/08/2019   BUN 10 01/08/2019   CREATININE 1.48 (H) 01/08/2019   GLUCOSE 99 01/08/2019   No results found for: INR, PROTIME  Assessment/Plan: L4-5 spondylolisthesis, spinal stenosis, facet arthropathy, lumbago, lumbar radiculopathy: I have discussed the situation with the patient.  I reviewed her imaging studies with her and pointed out that normalities.  We have discussed the various treatment options including surgery.  I have described the surgical treatment option L4-5 decompression, instrumentation and fusion.  I have shown  her surgical models.  I have given her a surgical pamphlet.  We have discussed the risks,  benefits, alternatives, expected postop course, and likelihood of achieving our goals with surgery.  I have answered all her questions.  She has decided to proceed with surgery.   Ophelia Charter 01/12/2019 7:27 AM

## 2019-01-12 NOTE — Progress Notes (Signed)
Occupational Therapy Evaluation Patient Details Name: Heather Espinoza MRN: [id number] DOB: 1945/07/21 Today's Date: 01/12/2019    History of Present Illness 73yo female s/p posterior lumbar fusion and instrumentation at L4-L5 done 01/12/19. PMH essential tremor, RLS, IBS, HTN, HLD, fibromyalgia, DM, COPD, hx R elbow surgery   Clinical Impression   PTA, pt lived in one story home with husband, and was independent for ADLs and IADLs. Pt currently presents decreased balance, strength, activity tolerance, and increased pain. Provided education regarding brace management and compensatory strategies for ADLs. Pt performed LB ADLs, UB ADLs, and functional transfers with supervision-min guard A and VCs for safety. Pt would benefit from continued OT services acutely to facilitate safe dc. Recommend dc with HHOT to increase safe performance of ADLs once medically stable per MD.     Follow Up Recommendations  Home health OT;Supervision/Assistance - 24 hour    Equipment Recommendations  None recommended by OT    Recommendations for Other Services PT consult     Precautions / Restrictions Precautions Precautions: Fall;Back Precaution Booklet Issued: Yes (comment) Precaution Comments: verbally reviewed Required Braces or Orthoses: Spinal Brace Spinal Brace: Lumbar corset;Applied in sitting position Restrictions Weight Bearing Restrictions: No      Mobility Bed Mobility Overal bed mobility: Needs Assistance Bed Mobility: Rolling;Sidelying to Sit;Sit to Sidelying Rolling: Min guard Sidelying to sit: Min guard     Sit to sidelying: Min guard General bed mobility comments: min guard A and increased time for safety  Transfers Overall transfer level: Needs assistance Equipment used: Rolling walker (2 wheeled) Transfers: Sit to/from Stand Sit to Stand: Min guard         General transfer comment: min guard A for safety, cues for hand placement    Balance Overall balance assessment:  Needs assistance Sitting-balance support: No upper extremity supported;Feet supported Sitting balance-Leahy Scale: Good     Standing balance support: Bilateral upper extremity supported;During functional activity;No upper extremity supported Standing balance-Leahy Scale: Fair Standing balance comment: Able to perform grooming at sink without UE support                           ADL either performed or assessed with clinical judgement   ADL Overall ADL's : Needs assistance/impaired Eating/Feeding: Independent;Sitting   Grooming: Wash/dry hands;Min guard;Standing Grooming Details (indicate cue type and reason): Min Guard A for safety at sink for hadn hygiene Upper Body Bathing: Min guard;Sitting   Lower Body Bathing: Min guard;Sit to/from stand   Upper Body Dressing : Min guard;Sitting Upper Body Dressing Details (indicate cue type and reason): Educating pt on brace management. Pt required VCs to don brace correctly Lower Body Dressing: Min guard;Sit to/from stand Lower Body Dressing Details (indicate cue type and reason): Pt demonstrated figure 4 method for LB ADLs. Pt required min guard A for safety Toilet Transfer: Min guard;Ambulation;Regular Toilet;RW Toilet Transfer Details (indicate cue type and reason): min guard A for safety Toileting- Clothing Manipulation and Hygiene: Min guard;Sit to/from stand       Functional mobility during ADLs: Min guard;Rolling walker General ADL Comments: Provided education for compensatory strategies and brace management. Pt required min guard A for most ADLs for safety     Vision Baseline Vision/History: Wears glasses Wears Glasses: At all times Patient Visual Report: No change from baseline Vision Assessment?: No apparent visual deficits     Perception     Praxis      Pertinent Vitals/Pain Pain Assessment: Faces Pain  Score: 6  Faces Pain Scale: Hurts little more Pain Location: back Pain Descriptors / Indicators:  Aching;Discomfort;Grimacing;Sore Pain Intervention(s): Limited activity within patient's tolerance;Monitored during session;Premedicated before session;Repositioned     Hand Dominance Right   Extremity/Trunk Assessment Upper Extremity Assessment Upper Extremity Assessment: Generalized weakness   Lower Extremity Assessment Lower Extremity Assessment: Defer to PT evaluation   Cervical / Trunk Assessment Cervical / Trunk Assessment: Normal;Other exceptions Cervical / Trunk Exceptions: s/p back sx   Communication Communication Communication: No difficulties   Cognition Arousal/Alertness: Awake/alert Behavior During Therapy: WFL for tasks assessed/performed Overall Cognitive Status: Within Functional Limits for tasks assessed                                 General Comments: Pt very sleepy, reports due to recent administration of pain meds   General Comments  Husband present throughout session    Exercises     Shoulder Instructions      Home Living Family/patient expects to be discharged to:: Private residence Living Arrangements: Spouse/significant other Available Help at Discharge: Family;Available 24 hours/day Type of Home: House Home Access: Stairs to enter [employer] of Steps: 8 Entrance Stairs-Rails: Can reach both Home Layout: One level Alternate Level Stairs-Number of Steps: 3 Alternate Level Stairs-Rails: Can reach both Bathroom Shower/Tub: Occupational psychologist: Standard     Home Equipment: Environmental consultant - 2 wheels;Bedside commode          Prior Functioning/Environment Level of Independence: Independent        Comments: drives, grocery shops, most days she is at home per her report        OT Problem List: Decreased strength;Decreased range of motion;Decreased activity tolerance;Impaired balance (sitting and/or standing);Decreased safety awareness;Decreased knowledge of use of DME or AE;Decreased knowledge of  precautions;Pain      OT Treatment/Interventions: Self-care/ADL training;Therapeutic exercise;Energy conservation;DME and/or AE instruction;Therapeutic activities;Patient/family education    OT Goals(Current goals can be found in the care plan section) Acute Rehab OT Goals Patient Stated Goal: less pain OT Goal Formulation: With patient Time For Goal Achievement: 01/26/19 Potential to Achieve Goals: Good  OT Frequency: Min 2X/week   Barriers to D/C:            Co-evaluation              AM-PAC OT "6 Clicks" Daily Activity     Outcome Measure Help from another person eating meals?: None Help from another person taking care of personal grooming?: A Little Help from another person toileting, which includes using toliet, bedpan, or urinal?: A Little Help from another person bathing (including washing, rinsing, drying)?: A Little Help from another person to put on and taking off regular upper body clothing?: A Little Help from another person to put on and taking off regular lower body clothing?: A Little 6 Click Score: 19   End of Session Equipment Utilized During Treatment: Gait belt;Rolling walker;Back brace Nurse Communication: Mobility status  Activity Tolerance: Patient tolerated treatment well Patient left: in bed;with call bell/phone within reach;with family/visitor present  OT Visit Diagnosis: Unsteadiness on feet (R26.81);Other abnormalities of gait and mobility (R26.89);Muscle weakness (generalized) (M62.81);Pain Pain - part of body: (Back)                Time: [id number] OT Time Calculation (min): 44 min Charges:  OT General Charges $OT Visit: 1 Visit OT Evaluation $OT Eval Moderate Complexity: 1 Mod OT Treatments $  Self Care/Home Management : 23-37 mins  Gus Rankin, OT Student  Gus Rankin 01/12/2019, 4:50 PM

## 2019-01-13 ENCOUNTER — Ambulatory Visit (INDEPENDENT_AMBULATORY_CARE_PROVIDER_SITE_OTHER): Payer: Medicare Other | Admitting: Internal Medicine

## 2019-01-13 LAB — CBC
HCT: 31.3 % — ABNORMAL LOW (ref 36.0–46.0)
Hemoglobin: 10.3 g/dL — ABNORMAL LOW (ref 12.0–15.0)
MCH: 31.2 pg (ref 26.0–34.0)
MCHC: 32.9 g/dL (ref 30.0–36.0)
MCV: 94.8 fL (ref 80.0–100.0)
Platelets: 317 10*3/uL (ref 150–400)
RBC: 3.3 MIL/uL — ABNORMAL LOW (ref 3.87–5.11)
RDW: 12.3 % (ref 11.5–15.5)
WBC: 14.7 10*3/uL — ABNORMAL HIGH (ref 4.0–10.5)
nRBC: 0 % (ref 0.0–0.2)

## 2019-01-13 LAB — BASIC METABOLIC PANEL
Anion gap: 8 (ref 5–15)
BUN: 18 mg/dL (ref 8–23)
CO2: 24 mmol/L (ref 22–32)
Calcium: 8.9 mg/dL (ref 8.9–10.3)
Chloride: 106 mmol/L (ref 98–111)
Creatinine, Ser: 1.62 mg/dL — ABNORMAL HIGH (ref 0.44–1.00)
GFR calc Af Amer: 37 mL/min — ABNORMAL LOW (ref >60)
GFR calc non Af Amer: 32 mL/min — ABNORMAL LOW (ref >60)
Glucose, Bld: 192 mg/dL — ABNORMAL HIGH (ref 70–99)
Potassium: 4.7 mmol/L (ref 3.5–5.1)
Sodium: 138 mmol/L (ref 135–145)

## 2019-01-13 LAB — GLUCOSE, CAPILLARY
Glucose-Capillary: 123 mg/dL — ABNORMAL HIGH (ref 70–99)
Glucose-Capillary: 115 mg/dL — ABNORMAL HIGH (ref 70–99)
Glucose-Capillary: 158 mg/dL — ABNORMAL HIGH (ref 70–99)
Glucose-Capillary: 132 mg/dL — ABNORMAL HIGH (ref 70–99)

## 2019-01-13 MED ORDER — TRAMADOL HCL 50 MG PO TABS
50.0000 mg | ORAL_TABLET | Freq: Four times a day (QID) | ORAL | Status: DC | PRN
  Administered 2019-01-14 (×2): 50 mg via ORAL
  Filled 2019-01-13 (×2): qty 1

## 2019-01-13 NOTE — Anesthesia Postprocedure Evaluation (Signed)
Anesthesia Post Note  Patient: SYNIYA CUI  Procedure(s) Performed: POSTERIOR LUMBAR INTERBODY FUSION, POSTERIOR INSTRUMENTATION LUMBAR FOUR- LUMBAR FIVE (N/A )     Patient location during evaluation: PACU Anesthesia Type: General Level of consciousness: awake and alert Pain management: pain level controlled Vital Signs Assessment: post-procedure vital signs reviewed and stable Respiratory status: spontaneous breathing, nonlabored ventilation, respiratory function stable and patient connected to nasal cannula oxygen Cardiovascular status: blood pressure returned to baseline and stable Postop Assessment: no apparent nausea or vomiting Anesthetic complications: no    Last Vitals:  Vitals:   01/13/19 0240 01/13/19 0748  BP: (!) 100/51 (!) 95/44  Pulse: (!) 56 64  Resp: 18 16  Temp: (!) 36.3 C 36.6 C  SpO2: 99% 94%    Last Pain:  Vitals:   01/13/19 0748  TempSrc: Oral  PainSc:                  [place] S

## 2019-01-13 NOTE — Progress Notes (Signed)
Subjective: The patient is alert and pleasant.  She is in no apparent distress.  She wants to go home tomorrow.  Objective: Vital signs in last 24 hours: Temp:  [97 F (36.1 C)-97.6 F (36.4 C)] 97.4 F (36.3 C) (10/27 0240) Pulse Rate:  [49-61] 56 (10/27 0240) Resp:  [13-20] 18 (10/27 0240) BP: (85-107)/(46-60) 100/51 (10/27 0240) SpO2:  [96 %-100 %] 99 % (10/27 0240) Estimated body mass index is 26.02 kg/m as calculated from the following:   Height as of this encounter: 5' 1.5" (1.562 m).   Weight as of this encounter: 63.5 kg.   Intake/Output from previous day: 10/26 0701 - 10/27 0700 In: 1293 [P.O.:340; I.V.:753; IV Piggyback:200] Out: 325 [Urine:225; Blood:100] Intake/Output this shift: No intake/output data recorded.  Physical exam the patient is alert and oriented.  She is moving her lower extremities well.  Lab Results: No results for input(s): WBC, HGB, HCT, PLT in the last 72 hours. BMET No results for input(s): NA, K, CL, CO2, GLUCOSE, BUN, CREATININE, CALCIUM in the last 72 hours.  Studies/Results: Dg Lumbar Spine 2-3 Views  Result Date: 01/12/2019 CLINICAL DATA:  L4-L5 PLIF. EXAM: LUMBAR SPINE - 2-3 VIEW; DG C-ARM 1-60 MIN COMPARISON:  MRI lumbar spine dated October 20, 2018. FINDINGS: Frontal and lateral intraoperative fluoroscopic images demonstrate interval L4-L5 PLIF. Connecting rods have not yet been placed. Trace L4-L5 and L5-S1 anterolisthesis is unchanged. No acute osseous abnormality. IMPRESSION: Intraoperative fluoroscopic guidance for L4-L5 PLIF. Electronically Signed   By: Titus Dubin M.D.   On: 01/12/2019 10:27   Dg Spine Portable 1 View  Result Date: 01/12/2019 CLINICAL DATA:  L4-5 posterior fusion. EXAM: PORTABLE SPINE - 1 VIEW COMPARISON:  Radiographs of August 11th 2020. FINDINGS: Single intraoperative cross-table lateral projection of the lumbar spine demonstrates a surgical probe at approximately the L4-5 level. IMPRESSION: Surgical  localization as described above. Electronically Signed   By: Marijo Conception M.D.   On: 01/12/2019 08:36   Dg C-arm 1-60 Min  Result Date: 01/12/2019 CLINICAL DATA:  L4-L5 PLIF. EXAM: LUMBAR SPINE - 2-3 VIEW; DG C-ARM 1-60 MIN COMPARISON:  MRI lumbar spine dated October 20, 2018. FINDINGS: Frontal and lateral intraoperative fluoroscopic images demonstrate interval L4-L5 PLIF. Connecting rods have not yet been placed. Trace L4-L5 and L5-S1 anterolisthesis is unchanged. No acute osseous abnormality. IMPRESSION: Intraoperative fluoroscopic guidance for L4-L5 PLIF. Electronically Signed   By: Titus Dubin M.D.   On: 01/12/2019 10:27    Assessment/Plan: Postop day #1: The patient is doing well.  We will mobilize her with PT and OT.  A CBC and BMP is pending.  She will likely go home tomorrow.  LOS: 1 day     Heather Espinoza 01/13/2019, 7:27 AM

## 2019-01-13 NOTE — Progress Notes (Signed)
Physical Therapy Treatment Patient Details Name: Heather Espinoza MRN: [id number] DOB: [birth year] Today's Date: 01/13/2019    History of Present Illness 73yo female s/p posterior lumbar fusion and instrumentation at L4-L5 done 01/12/19. PMH essential tremor, RLS, IBS, HTN, HLD, fibromyalgia, DM, COPD, hx R elbow surgery    PT Comments    Pt lethargic and falling asleep at times throughout session. Will need reinforcement of precautions and education next session. We did initiate stair training however pt not able to tolerate 8 stairs to simulate home environment - instead she tolerated 3. Will continue to follow and progress as able per POC.     Follow Up Recommendations  Home health PT;Supervision/Assistance - 24 hour     Equipment Recommendations  Rolling walker with 5" wheels;3in1 (PT)    Recommendations for Other Services       Precautions / Restrictions Precautions Precautions: Fall;Back Precaution Booklet Issued: Yes (comment) Precaution Comments: pt unable to recall precautions. reviewed all precautions verbally and during functional mobility.  Required Braces or Orthoses: Spinal Brace Spinal Brace: Lumbar corset;Applied in sitting position Restrictions Weight Bearing Restrictions: No    Mobility  Bed Mobility               General bed mobility comments: Pt was received sitting up in the recliner chair. Pt asking to sit back up in recliner at end of session.   Transfers Overall transfer level: Needs assistance Equipment used: Rolling walker (2 wheeled) Transfers: Sit to/from Stand Sit to Stand: Min guard         General transfer comment: Hands-on guarding for support - appeared slightly confused at times. VC's for hand placement on seated surface for safety and pt reaching out for tag hanging on walker instead. Pt able to power up to full stand without physical assistance.   Ambulation/Gait Ambulation/Gait assistance: Min guard;Min assist Gait Distance  (Feet): 175 Feet Assistive device: Rolling walker (2 wheeled) Gait Pattern/deviations: Step-through pattern;Decreased step length - right;Decreased step length - left;Decreased stride length;Trunk flexed Gait velocity: decreased Gait velocity interpretation: <1.31 ft/sec, indicative of household ambulator General Gait Details: VC's and assist for improved posture, closer walker proximity, and forward gaze. Pt frequently stopping with eyes closed and required cues to open eyes and continue walking. Grossly min guard assist with occasional min assist for walker management and steering.    Stairs Stairs: Yes Stairs assistance: Min assist Stair Management: One rail Left;Forwards;Step to pattern Number of Stairs: 1(x3 trials) General stair comments: HHA on the R with railing use on the left. Pt reports she does not have railings at home. VC's for sequencing and general safety.    Wheelchair Mobility    Modified Rankin (Stroke Patients Only)       Balance Overall balance assessment: Needs assistance Sitting-balance support: No upper extremity supported;Feet supported Sitting balance-Leahy Scale: Good     Standing balance support: During functional activity;Single extremity supported Standing balance-Leahy Scale: Poor Standing balance comment: Required UE support on RW.                             Cognition Arousal/Alertness: Lethargic;Suspect due to medications Behavior During Therapy: Flat affect Overall Cognitive Status: Difficult to assess                                 General Comments: Pt very sleepy, falling asleep while trying to take  a sip of her drink, while standing with RW, and at end of session with family member on the phone.      Exercises      General Comments        Pertinent Vitals/Pain Pain Assessment: Faces Faces Pain Scale: Hurts little more Pain Location: back Pain Descriptors / Indicators: Grimacing;Operative site  guarding Pain Intervention(s): Limited activity within patient's tolerance;Monitored during session;Repositioned    Home Living                      Prior Function            PT Goals (current goals can now be found in the care plan section) Acute Rehab PT Goals Patient Stated Goal: less pain PT Goal Formulation: With patient Time For Goal Achievement: 01/26/19 Potential to Achieve Goals: Good Progress towards PT goals: Progressing toward goals    Frequency    Min 5X/week      PT Plan Current plan remains appropriate    Co-evaluation              AM-PAC PT "6 Clicks" Mobility   Outcome Measure  Help needed turning from your back to your side while in a flat bed without using bedrails?: A Little Help needed moving from lying on your back to sitting on the side of a flat bed without using bedrails?: A Little Help needed moving to and from a bed to a chair (including a wheelchair)?: A Little Help needed standing up from a chair using your arms (e.g., wheelchair or bedside chair)?: A Little Help needed to walk in hospital room?: A Little Help needed climbing 3-5 steps with a railing? : A Lot 6 Click Score: 17    End of Session Equipment Utilized During Treatment: Back brace Activity Tolerance: Patient limited by lethargy Patient left: in chair;with call bell/phone within reach Nurse Communication: Mobility status(Pt level of lethargy) PT Visit Diagnosis: Unsteadiness on feet (R26.81);Difficulty in walking, not elsewhere classified (R26.2);Pain;Muscle weakness (generalized) (M62.81) Pain - Right/Left: (back pain) Pain - part of body: (back pain)     Time: [id number] PT Time Calculation (min) (ACUTE ONLY): 22 min  Charges:  $Gait Training: 8-22 mins                     [name], PT, DPT Acute Rehabilitation Services Pager: [phone] Office: [phone]    [name] 01/13/2019, 12:10 PM

## 2019-01-13 NOTE — Progress Notes (Signed)
Occupational Therapy Treatment Patient Details Name: Heather Espinoza MRN: [id number] DOB: [birth year] Today's Date: 01/13/2019    History of present illness 73yo female s/p posterior lumbar fusion and instrumentation at L4-L5 done 01/12/19. PMH essential tremor, RLS, IBS, HTN, HLD, fibromyalgia, DM, COPD, hx R elbow surgery   OT comments  Pt very sleepy today, limiting progress towards goals this session. Pt continues to present with decreased safety, balance, activity tolerance, knowledge of precautions and brace management, and increased pain. Pt appearing lethargic, possibly due to medication, and demonstrated decreased STM and awareness throughout session. Provided education regarding brace management. Pt performed UB and LB dressing with min guard A, and brace management with mod A and max VCs to don brace correctly and for safety. Continue to recommend dc with HHOT to increase safe performance of ADLs and facilitate safe dc. Will continue to follow acutely as admitted.    Follow Up Recommendations  Home health OT;Supervision/Assistance - 24 hour    Equipment Recommendations  None recommended by OT    Recommendations for Other Services PT consult    Precautions / Restrictions Precautions Precautions: Fall;Back Precaution Booklet Issued: Yes (comment) Precaution Comments: pt unable to recall precautions. reviewed all precautions Required Braces or Orthoses: Spinal Brace Spinal Brace: Lumbar corset;Applied in sitting position Restrictions Weight Bearing Restrictions: No       Mobility Bed Mobility Overal bed mobility: Needs Assistance Bed Mobility: Rolling;Sidelying to Sit Rolling: Min guard Sidelying to sit: Min guard;HOB elevated       General bed mobility comments: pt required min guard A and increased time for safety  Transfers Overall transfer level: Needs assistance Equipment used: Rolling walker (2 wheeled) Transfers: Sit to/from Stand Sit to Stand: Min guard          General transfer comment: min guard A for safety, requiring mod cues for hand placement    Balance Overall balance assessment: Needs assistance Sitting-balance support: No upper extremity supported;Feet supported Sitting balance-Leahy Scale: Good     Standing balance support: During functional activity;Single extremity supported Standing balance-Leahy Scale: Poor Standing balance comment: Pt required external support to maintain standing balance when donning pants                           ADL either performed or assessed with clinical judgement   ADL Overall ADL's : Needs assistance/impaired Eating/Feeding: Sitting;Set up Eating/Feeding Details (indicate cue type and reason): pt required mod VCs to continue eating chips due to lethargy             Upper Body Dressing : Moderate assistance;Min guard;Sitting Upper Body Dressing Details (indicate cue type and reason): Reviewed brace managment. Pt required min guard A to don shirt, and mod A with max VCs to don brace.  Lower Body Dressing: Min guard;Sit to/from stand Lower Body Dressing Details (indicate cue type and reason): Pt demonstrated figure 4 method to don pants with min guard A for safety Toilet Transfer: Min guard;Ambulation;RW(simulated to recliner) Toilet Transfer Details (indicate cue type and reason): min guard A for safety         Functional mobility during ADLs: Min guard;Rolling walker General ADL Comments: Pt performed dressing with min guard A, requiring mod A and max VCs for brace management. pt extremely sleepy, requring VCs for safety throughout     [place]: Lethargic;Suspect due to medications  Behavior During Therapy: Flat affect Overall Cognitive Status: Difficult to assess                                 General Comments: Pt extremely sleepy, suspect due to medications. When asked to don back brace,  pt attempted to [facility] the back portion of the brace, and then proceded to don brace backwards, requiring mod A and max VCs to correct. When asked questions, pt would stop to think and begin to fall asleep before responding; required cues to remain awake. Pt demonstrated decreased STM as seen by difficulty remembering back precautions and what was done during the session.         Exercises     Shoulder Instructions       General Comments per RN, pts BP is low. Pt was encouraged to eat chips at end of session to attempt to raise BP    Pertinent Vitals/ Pain       Pain Assessment: Faces Faces Pain Scale: Hurts little more Pain Location: back Pain Descriptors / Indicators: Aching;Discomfort;Grimacing;Sore Pain Intervention(s): Limited activity within patient's tolerance;Monitored during session;Premedicated before session;Repositioned  Home Living                                          Prior Functioning/Environment              Frequency  Min 3X/week        Progress Toward Goals  OT Goals(current goals can now be found in the care plan section)  Progress towards OT goals: Not progressing toward goals - comment(pt lethargy limiting progress towards goals this session)  Acute Rehab OT Goals Patient Stated Goal: less pain OT Goal Formulation: With patient Time For Goal Achievement: 01/26/19 Potential to Achieve Goals: Good ADL Goals Pt Will Perform Grooming: with modified independence;standing Pt Will Perform Upper Body Dressing: sitting;with modified independence Pt Will Perform Lower Body Dressing: with modified independence;sit to/from stand Pt Will Transfer to Toilet: with modified independence;ambulating;regular height toilet Pt Will Perform Toileting - Clothing Manipulation and hygiene: with modified independence;sit to/from stand Pt Will Perform Tub/Shower Transfer: with supervision;ambulating;shower seat;Shower transfer;3 in 1;rolling  walker Additional ADL Goal #1: Pt will verbalize 3/3 back precautions with 1 VC Additional ADL Goal #2: Pt will perform bed mobility using compensatory techniques in preparation for ADLs with 1 VC  Plan Discharge plan remains appropriate;Frequency needs to be updated    Co-evaluation                 AM-PAC OT "6 Clicks" Daily Activity     Outcome Measure   Help from another person eating meals?: None Help from another person taking care of personal grooming?: A Little Help from another person toileting, which includes using toliet, bedpan, or urinal?: A Little Help from another person bathing (including washing, rinsing, drying)?: A Little Help from another person to put on and taking off regular upper body clothing?: A Lot Help from another person to put on and taking off regular lower body clothing?: A Little 6 Click Score: 18    End of Session Equipment Utilized During Treatment: Rolling walker;Back brace  OT Visit Diagnosis: Unsteadiness on feet (R26.81);Other abnormalities of gait and mobility (R26.89);Muscle weakness (generalized) (M62.81);Pain Pain - part of body: (back)   Activity Tolerance Patient tolerated treatment well;Patient  limited by lethargy   Patient Left in chair;with call bell/phone within reach   Nurse Communication Mobility status;Other (comment)(Pt lethargic due to medications)        Time: [id number] OT Time Calculation (min): 22 min  Charges: OT General Charges $OT Visit: 1 Visit OT Treatments $Self Care/Home Management : 8-22 mins  Gus Rankin, OT Student  Gus Rankin 01/13/2019, 9:26 AM

## 2019-01-14 LAB — GLUCOSE, CAPILLARY: Glucose-Capillary: 96 mg/dL (ref 70–99)

## 2019-01-14 MED ORDER — CYCLOBENZAPRINE HCL 10 MG PO TABS: 10.0000 mg | ORAL_TABLET | Freq: Three times a day (TID) | ORAL | 0 refills | Status: DC | PRN

## 2019-01-14 MED ORDER — OXYCODONE HCL 10 MG PO TABS: 10.0000 mg | ORAL_TABLET | ORAL | 0 refills | Status: DC | PRN

## 2019-01-14 NOTE — Plan of Care (Signed)
Pt and husband given D/C instructions with verbal understanding. Pt's incision is clean and dry with no sign of infection. Pt's IV was removed prior to D/C. Pt D/C'd home via wheelchair per MD order. Pt is stable @ D/C and has no other needs at this time. Marwan Lipe, RN 

## 2019-01-14 NOTE — TOC Transition Note (Signed)
Transition of Care [facility]) - CM/SW Discharge Note   Patient Details  Name: Heather Espinoza MRN: [id number] Date of Birth: [birth year]  Transition of Care [facility]) CM/SW Contact:  Benard Halsted, LCSW Phone Number: 01/14/2019, 9:14 AM   Clinical Narrative:    CSW faxed referral to [facility]. Will need Face to Face order completed by MD as well.    Final next level of care: [place] Barriers to Discharge: No Barriers Identified   Patient Goals and CMS Choice Patient states their goals for this hospitalization and ongoing recovery are:: Get stronger CMS Medicare.gov Compare Post Acute Care list provided to:: Patient Choice offered to / list presented to : Patient  Discharge Placement                       Discharge Plan and Services In-house Referral: NA Discharge Planning Services: CM Consult Post Acute Care Choice: Home Health                    HH Arranged: PT, OT Post Acute [facility] Agency: [facility] [place], [place]) Date [facility] Agency Contacted: 01/12/19 Time [place]: 1600    Social Determinants of Health (SDOH) Interventions     Readmission Risk Interventions No flowsheet data found.

## 2019-01-14 NOTE — Discharge Summary (Signed)
Physician Discharge Summary  Patient ID: Heather Espinoza MRN: [id number] DOB/AGE: 1946/02/01 73 y.o.  Admit date: 01/12/2019 Discharge date: 01/14/2019  Admission Diagnoses: L4-5 spondylolisthesis, facet arthropathy, lumbago, lumbar radiculopathy, neurogenic location  Discharge Diagnoses: The same Active Problems:   Spondylolisthesis, lumbar region   Discharged Condition: good  Hospital Course: I performed an L4-5 decompression, instrumentation and fusion on the patient on 01/12/2019.  The surgery went well.  The patient's postoperative course was unremarkable.  On postoperative day #2 she requested discharge to home.  She was given written and oral discharge instructions.  All her questions were answered.  Consults: PT, OT, care management Significant Diagnostic Studies: None Treatments: L4-5 decompression, instrumentation and fusion. Discharge Exam: Blood pressure 115/67, pulse 92, temperature 98.2 F (36.8 C), temperature source Oral, resp. rate 16, height 5' 1.5" (1.562 m), weight 63.5 kg, SpO2 97 %. The patient is alert and pleasant.  Her dressing is clean and dry.  Her lower extremity strength is normal.  Disposition: Home  Discharge Instructions    Call MD for:  difficulty breathing, headache or visual disturbances   Complete by: As directed    Call MD for:  extreme fatigue   Complete by: As directed    Call MD for:  hives   Complete by: As directed    Call MD for:  persistant dizziness or light-headedness   Complete by: As directed    Call MD for:  persistant nausea and vomiting   Complete by: As directed    Call MD for:  redness, tenderness, or signs of infection (pain, swelling, redness, odor or green/yellow discharge around incision site)   Complete by: As directed    Call MD for:  severe uncontrolled pain   Complete by: As directed    Call MD for:  temperature >100.4   Complete by: As directed    Diet - low sodium heart healthy   Complete by: As directed    Discharge instructions   Complete by: As directed    Call [phone] for a followup appointment. Take a stool softener while you are using pain medications.   Driving Restrictions   Complete by: As directed    Do not drive for 2 weeks.   Increase activity slowly   Complete by: As directed    Lifting restrictions   Complete by: As directed    Do not lift more than 5 pounds. No excessive bending or twisting.   May shower / Bathe   Complete by: As directed    Remove the dressing for 3 days after surgery.  You may shower, but leave the incision alone.   Remove dressing in 24 hours   Complete by: As directed      Allergies as of 01/14/2019      Reactions   Flagyl [metronidazole] Shortness Of Breath, Swelling   Lyrica [pregabalin] Shortness Of Breath   Adhesive [tape] Other (See Comments)   Takes off patient's skin   Aspirin Other (See Comments)   Stomach bleed   Latex Itching, Rash   Phenergan [promethazine Hcl] Other (See Comments)   Caused grogginess, altered mental status   Prednisone Anxiety   Sulfa Antibiotics Nausea Only   Tetanus Toxoids Swelling   Arm Area      Medication List    TAKE these medications   albuterol 108 (90 Base) MCG/ACT inhaler Commonly known as: VENTOLIN HFA Inhale 2 puffs into the lungs every 6 (six) hours as needed for wheezing or shortness of breath.  albuterol (2.5 MG/3ML) 0.083% nebulizer solution Commonly known as: PROVENTIL Take 2.5 mg by nebulization every 6 (six) hours as needed for wheezing or shortness of breath.   amLODipine 5 MG tablet Commonly known as: NORVASC Take 1 tablet (5 mg total) by mouth daily.   aspirin 81 MG chewable tablet Chew 81 mg by mouth daily.   B-12 2500 MCG Subl Place 2,500 mcg under the tongue daily.   Biotin 10 MG Tabs Take 10 mg by mouth daily.   bisacodyl 10 MG suppository Commonly known as: DULCOLAX Place 1 suppository (10 mg total) rectally as needed for moderate constipation.   buPROPion 150  MG 24 hr tablet Commonly known as: WELLBUTRIN XL Take 150 mg by mouth daily.   cholecalciferol 1000 units tablet Commonly known as: VITAMIN D Take 1,000 Units by mouth daily.   cyclobenzaprine 10 MG tablet Commonly known as: FLEXERIL Take 1 tablet (10 mg total) by mouth 3 (three) times daily as needed for muscle spasms.   dicyclomine 10 MG capsule Commonly known as: BENTYL TAKE 1 CAPSULE BY MOUTH TWICE DAILY AS NEEDED FOR  SPASMS What changed: See the new instructions.   docusate sodium 100 MG capsule Commonly known as: Colace Take 2 capsules (200 mg total) by mouth at bedtime. What changed: when to take this   DULoxetine 60 MG capsule Commonly known as: CYMBALTA Take 1 capsule by mouth twice daily   esomeprazole 20 MG capsule Commonly known as: NEXIUM Take 40 mg by mouth 2 (two) times daily before a meal.   esomeprazole 40 MG capsule Commonly known as: NEXIUM Take 1 capsule (40 mg total) by mouth daily before breakfast.   famotidine 40 MG tablet Commonly known as: PEPCID TAKE 1 TABLET BY MOUTH AT BEDTIME   Fluticasone-Salmeterol 250-50 MCG/DOSE Aepb Commonly known as: ADVAIR Inhale 1 puff into the lungs every 12 (twelve) hours.   gabapentin 400 MG capsule Commonly known as: NEURONTIN Take 400 mg by mouth 2 (two) times daily.   GELATIN PO Take 1 capsule by mouth daily. After a meal   levothyroxine 75 MCG tablet Commonly known as: SYNTHROID Take 37.5 mcg by mouth daily before breakfast.   lisinopril 10 MG tablet Commonly known as: ZESTRIL Take 10 mg by mouth daily.   lubiprostone 8 MCG capsule Commonly known as: Amitiza Take 2 capsules (16 mcg total) by mouth daily with breakfast. What changed:   how much to take  when to take this  reasons to take this   metformin 500 MG (OSM) 24 hr tablet Commonly known as: FORTAMET Take 500 mg by mouth every evening.   metoprolol succinate 25 MG 24 hr tablet Commonly known as: TOPROL-XL Take 12.5 mg by mouth  daily.   mirtazapine 30 MG tablet Commonly known as: REMERON Take 30 mg by mouth at bedtime.   ondansetron 4 MG tablet Commonly known as: ZOFRAN TAKE 1 TABLET BY MOUTH TWICE DAILY AS NEEDED FOR NAUSEA FOR VOMITING What changed: See the new instructions.   Oxycodone HCl 10 MG Tabs Take 1 tablet (10 mg total) by mouth every 4 (four) hours as needed for severe pain ((score 7 to 10)).   propranolol 20 MG tablet Commonly known as: INDERAL Take 1 tablet (20 mg total) by mouth 2 (two) times daily.   simvastatin 20 MG tablet Commonly known as: ZOCOR Take 20 mg by mouth every evening.   traMADol 50 MG tablet Commonly known as: ULTRAM Take 50 mg by mouth daily as needed for moderate  pain.   traZODone 100 MG tablet Commonly known as: DESYREL Take 100 mg by mouth at bedtime as needed for sleep.   vitamin E 400 UNIT capsule Take 400 Units by mouth daily.        Signed: Ophelia Charter 01/14/2019, 7:40 AM

## 2019-01-14 NOTE — Progress Notes (Signed)
Physical Therapy Treatment Patient Details Name: Heather Espinoza MRN: [id number] DOB: [birth year] Today's Date: 01/14/2019    History of Present Illness 73yo female s/p posterior lumbar fusion and instrumentation at L4-L5 done 01/12/19. PMH essential tremor, RLS, IBS, HTN, HLD, fibromyalgia, DM, COPD, hx R elbow surgery    PT Comments    Pt progressing very slowly towards physical therapy goals. Continues to have difficulty recalling precautions, and lethargy continues to limit sessions. Pt was found laying across the bed with back twisted and was educated on proper positioning recommendations. Adamantly declined stair training despite max encouragement. Pt anticipates d/c home today, however strongly recommend 24 hour assistance at home with HHPT to follow up due to continued lethargy and need for assist with the RW.    Follow Up Recommendations  Home health PT;Supervision/Assistance - 24 hour     Equipment Recommendations  Rolling walker with 5" wheels;3in1 (PT)    Recommendations for Other Services       Precautions / Restrictions Precautions Precautions: Fall;Back Precaution Booklet Issued: Yes (comment) Precaution Comments: Pt able to recall 1/3 precautions. Reviewed 3/3 Required Braces or Orthoses: Spinal Brace Spinal Brace: Lumbar corset;Applied in sitting position Restrictions Weight Bearing Restrictions: No    Mobility  Bed Mobility Overal bed mobility: Needs Assistance Bed Mobility: Sit to Sidelying         Sit to sidelying: Min guard General bed mobility comments: Pt was received laying down in bed as if she twisted back and laid face down on bed from sitting EOB. Pt was woken up and educated on proper positioning according to back precautions.   Transfers Overall transfer level: Needs assistance Equipment used: Rolling walker (2 wheeled) Transfers: Sit to/from Stand Sit to Stand: Min assist         General transfer comment: Hands-on guarding for support.  VC's for hand placement on seated surface for safety without making corrective changes. Pt able to power up to full stand with min assist.   Ambulation/Gait Ambulation/Gait assistance: Min guard;Min assist Gait Distance (Feet): 200 Feet Assistive device: Rolling walker (2 wheeled) Gait Pattern/deviations: Step-through pattern;Decreased step length - right;Decreased step length - left;Decreased stride length;Trunk flexed Gait velocity: decreased Gait velocity interpretation: <1.31 ft/sec, indicative of household ambulator General Gait Details: VC's and assist for improved posture, closer walker proximity, and forward gaze. Grossly min guard assist with occasional min assist for walker management and steering. Several standing rest breaks due to pain.    Stairs         General stair comments: Pt adamantly declined stair training   Wheelchair Mobility    Modified Rankin (Stroke Patients Only)       Balance Overall balance assessment: Needs assistance Sitting-balance support: No upper extremity supported;Feet supported Sitting balance-Leahy Scale: Good     Standing balance support: During functional activity;Single extremity supported Standing balance-Leahy Scale: Poor Standing balance comment: Required UE support on RW.                             Cognition Arousal/Alertness: Lethargic Behavior During Therapy: Flat affect Overall Cognitive Status: Impaired/Different from baseline Area of Impairment: Memory;Safety/judgement                     Memory: Decreased recall of precautions;Decreased short-term memory   Safety/Judgement: Decreased awareness of safety;Decreased awareness of deficits     General Comments: Requires almost constant cueing to maintain back precautions.  Exercises      General Comments        Pertinent Vitals/Pain Pain Assessment: Faces Faces Pain Scale: Hurts even more Pain Location: back Pain Descriptors /  Indicators: Grimacing;Operative site guarding Pain Intervention(s): Limited activity within patient's tolerance;Monitored during session;Repositioned    Home Living                      Prior Function            PT Goals (current goals can now be found in the care plan section) Acute Rehab PT Goals Patient Stated Goal: less pain PT Goal Formulation: With patient Time For Goal Achievement: 01/26/19 Potential to Achieve Goals: Good Progress towards PT goals: Progressing toward goals    Frequency    Min 5X/week      PT Plan Current plan remains appropriate    Co-evaluation              AM-PAC PT "6 Clicks" Mobility   Outcome Measure  Help needed turning from your back to your side while in a flat bed without using bedrails?: A Little Help needed moving from lying on your back to sitting on the side of a flat bed without using bedrails?: A Little Help needed moving to and from a bed to a chair (including a wheelchair)?: A Little Help needed standing up from a chair using your arms (e.g., wheelchair or bedside chair)?: A Little Help needed to walk in hospital room?: A Little Help needed climbing 3-5 steps with a railing? : A Lot 6 Click Score: 17    End of Session Equipment Utilized During Treatment: Back brace Activity Tolerance: Patient limited by lethargy Patient left: in bed;with call bell/phone within reach Nurse Communication: Mobility status(Pt level of lethargy) PT Visit Diagnosis: Unsteadiness on feet (R26.81);Difficulty in walking, not elsewhere classified (R26.2);Pain;Muscle weakness (generalized) (M62.81) Pain - Right/Left: (back pain) Pain - part of body: (back pain)     Time: [id number] PT Time Calculation (min) (ACUTE ONLY): 25 min  Charges:  $Gait Training: 23-37 mins                     [name], PT, DPT Acute Rehabilitation Services Pager: [phone] Office: [phone]    [name] 01/14/2019, 10:27 AM

## 2019-01-15 DIAGNOSIS — Z7984 Long term (current) use of oral hypoglycemic drugs: Secondary | ICD-10-CM | POA: Diagnosis not present

## 2019-01-15 DIAGNOSIS — G250 Essential tremor: Secondary | ICD-10-CM | POA: Diagnosis not present

## 2019-01-15 DIAGNOSIS — J449 Chronic obstructive pulmonary disease, unspecified: Secondary | ICD-10-CM | POA: Diagnosis not present

## 2019-01-15 DIAGNOSIS — Z7982 Long term (current) use of aspirin: Secondary | ICD-10-CM | POA: Diagnosis not present

## 2019-01-15 DIAGNOSIS — K589 Irritable bowel syndrome without diarrhea: Secondary | ICD-10-CM | POA: Diagnosis not present

## 2019-01-15 DIAGNOSIS — K5790 Diverticulosis of intestine, part unspecified, without perforation or abscess without bleeding: Secondary | ICD-10-CM | POA: Diagnosis not present

## 2019-01-15 DIAGNOSIS — M48062 Spinal stenosis, lumbar region with neurogenic claudication: Secondary | ICD-10-CM | POA: Diagnosis not present

## 2019-01-15 DIAGNOSIS — M8580 Other specified disorders of bone density and structure, unspecified site: Secondary | ICD-10-CM | POA: Diagnosis not present

## 2019-01-15 DIAGNOSIS — I10 Essential (primary) hypertension: Secondary | ICD-10-CM | POA: Diagnosis not present

## 2019-01-15 DIAGNOSIS — M1990 Unspecified osteoarthritis, unspecified site: Secondary | ICD-10-CM | POA: Diagnosis not present

## 2019-01-15 DIAGNOSIS — Z4789 Encounter for other orthopedic aftercare: Secondary | ICD-10-CM | POA: Diagnosis not present

## 2019-01-15 DIAGNOSIS — N200 Calculus of kidney: Secondary | ICD-10-CM | POA: Diagnosis not present

## 2019-01-15 DIAGNOSIS — M797 Fibromyalgia: Secondary | ICD-10-CM | POA: Diagnosis not present

## 2019-01-15 DIAGNOSIS — G2581 Restless legs syndrome: Secondary | ICD-10-CM | POA: Diagnosis not present

## 2019-01-15 DIAGNOSIS — E119 Type 2 diabetes mellitus without complications: Secondary | ICD-10-CM | POA: Diagnosis not present

## 2019-01-15 DIAGNOSIS — M4316 Spondylolisthesis, lumbar region: Secondary | ICD-10-CM | POA: Diagnosis not present

## 2019-01-15 DIAGNOSIS — E039 Hypothyroidism, unspecified: Secondary | ICD-10-CM | POA: Diagnosis not present

## 2019-01-15 DIAGNOSIS — M5416 Radiculopathy, lumbar region: Secondary | ICD-10-CM | POA: Diagnosis not present

## 2019-01-15 DIAGNOSIS — M5136 Other intervertebral disc degeneration, lumbar region: Secondary | ICD-10-CM | POA: Diagnosis not present

## 2019-01-15 DIAGNOSIS — Z8701 Personal history of pneumonia (recurrent): Secondary | ICD-10-CM | POA: Diagnosis not present

## 2019-01-15 DIAGNOSIS — G4730 Sleep apnea, unspecified: Secondary | ICD-10-CM | POA: Diagnosis not present

## 2019-01-15 MED FILL — Sodium Chloride IV Soln 0.9%: INTRAVENOUS | Qty: 1000 | Status: AC

## 2019-01-15 MED FILL — Heparin Sodium (Porcine) Inj 1000 Unit/ML: INTRAMUSCULAR | Qty: 30 | Status: AC

## 2019-02-01 DIAGNOSIS — R591 Generalized enlarged lymph nodes: Secondary | ICD-10-CM | POA: Diagnosis not present

## 2019-02-01 DIAGNOSIS — K1120 Sialoadenitis, unspecified: Secondary | ICD-10-CM | POA: Diagnosis not present

## 2019-02-02 ENCOUNTER — Ambulatory Visit: Payer: Medicare Other | Admitting: Neurology

## 2019-02-02 DIAGNOSIS — I959 Hypotension, unspecified: Secondary | ICD-10-CM | POA: Diagnosis not present

## 2019-02-02 DIAGNOSIS — E039 Hypothyroidism, unspecified: Secondary | ICD-10-CM | POA: Diagnosis not present

## 2019-02-02 DIAGNOSIS — I10 Essential (primary) hypertension: Secondary | ICD-10-CM | POA: Diagnosis not present

## 2019-02-02 DIAGNOSIS — K219 Gastro-esophageal reflux disease without esophagitis: Secondary | ICD-10-CM | POA: Diagnosis not present

## 2019-02-02 DIAGNOSIS — E785 Hyperlipidemia, unspecified: Secondary | ICD-10-CM | POA: Diagnosis not present

## 2019-02-02 DIAGNOSIS — L03211 Cellulitis of face: Secondary | ICD-10-CM | POA: Diagnosis not present

## 2019-02-02 DIAGNOSIS — G9340 Encephalopathy, unspecified: Secondary | ICD-10-CM | POA: Diagnosis not present

## 2019-02-02 DIAGNOSIS — R4182 Altered mental status, unspecified: Secondary | ICD-10-CM | POA: Diagnosis not present

## 2019-02-02 DIAGNOSIS — A419 Sepsis, unspecified organism: Secondary | ICD-10-CM | POA: Diagnosis not present

## 2019-02-02 DIAGNOSIS — M4316 Spondylolisthesis, lumbar region: Secondary | ICD-10-CM | POA: Diagnosis not present

## 2019-02-02 DIAGNOSIS — J449 Chronic obstructive pulmonary disease, unspecified: Secondary | ICD-10-CM | POA: Diagnosis not present

## 2019-02-02 DIAGNOSIS — E119 Type 2 diabetes mellitus without complications: Secondary | ICD-10-CM | POA: Diagnosis not present

## 2019-02-02 DIAGNOSIS — Z79899 Other long term (current) drug therapy: Secondary | ICD-10-CM | POA: Diagnosis not present

## 2019-02-02 DIAGNOSIS — M5186 Other intervertebral disc disorders, lumbar region: Secondary | ICD-10-CM | POA: Diagnosis not present

## 2019-02-02 DIAGNOSIS — M47816 Spondylosis without myelopathy or radiculopathy, lumbar region: Secondary | ICD-10-CM | POA: Diagnosis not present

## 2019-02-02 DIAGNOSIS — R069 Unspecified abnormalities of breathing: Secondary | ICD-10-CM | POA: Diagnosis not present

## 2019-02-02 DIAGNOSIS — R221 Localized swelling, mass and lump, neck: Secondary | ICD-10-CM | POA: Diagnosis not present

## 2019-02-02 DIAGNOSIS — R6520 Severe sepsis without septic shock: Secondary | ICD-10-CM | POA: Diagnosis not present

## 2019-02-02 DIAGNOSIS — L0291 Cutaneous abscess, unspecified: Secondary | ICD-10-CM | POA: Diagnosis not present

## 2019-02-02 DIAGNOSIS — D72829 Elevated white blood cell count, unspecified: Secondary | ICD-10-CM | POA: Diagnosis not present

## 2019-02-02 DIAGNOSIS — K1120 Sialoadenitis, unspecified: Secondary | ICD-10-CM | POA: Diagnosis not present

## 2019-02-03 ENCOUNTER — Encounter (HOSPITAL_COMMUNITY): Payer: Self-pay | Admitting: Emergency Medicine

## 2019-02-03 ENCOUNTER — Inpatient Hospital Stay (HOSPITAL_COMMUNITY)
Admission: EM | Admit: 2019-02-03 | Discharge: 2019-02-06 | DRG: 154 | Disposition: A | Discharge status: Home Health | Payer: Medicare Other | Source: Other Acute Inpatient Hospital | Attending: Internal Medicine | Admitting: Internal Medicine

## 2019-02-03 ENCOUNTER — Other Ambulatory Visit: Payer: Self-pay

## 2019-02-03 DIAGNOSIS — E119 Type 2 diabetes mellitus without complications: Secondary | ICD-10-CM

## 2019-02-03 DIAGNOSIS — D649 Anemia, unspecified: Secondary | ICD-10-CM | POA: Diagnosis present

## 2019-02-03 DIAGNOSIS — Z9049 Acquired absence of other specified parts of digestive tract: Secondary | ICD-10-CM

## 2019-02-03 DIAGNOSIS — M797 Fibromyalgia: Secondary | ICD-10-CM | POA: Diagnosis present

## 2019-02-03 DIAGNOSIS — G9341 Metabolic encephalopathy: Secondary | ICD-10-CM | POA: Diagnosis present

## 2019-02-03 DIAGNOSIS — Z882 Allergy status to sulfonamides status: Secondary | ICD-10-CM

## 2019-02-03 DIAGNOSIS — G9340 Encephalopathy, unspecified: Secondary | ICD-10-CM | POA: Diagnosis not present

## 2019-02-03 DIAGNOSIS — L03211 Cellulitis of face: Secondary | ICD-10-CM | POA: Diagnosis present

## 2019-02-03 DIAGNOSIS — Z7984 Long term (current) use of oral hypoglycemic drugs: Secondary | ICD-10-CM

## 2019-02-03 DIAGNOSIS — D62 Acute posthemorrhagic anemia: Secondary | ICD-10-CM

## 2019-02-03 DIAGNOSIS — Z79891 Long term (current) use of opiate analgesic: Secondary | ICD-10-CM

## 2019-02-03 DIAGNOSIS — J449 Chronic obstructive pulmonary disease, unspecified: Secondary | ICD-10-CM

## 2019-02-03 DIAGNOSIS — Z20828 Contact with and (suspected) exposure to other viral communicable diseases: Secondary | ICD-10-CM | POA: Diagnosis present

## 2019-02-03 DIAGNOSIS — E039 Hypothyroidism, unspecified: Secondary | ICD-10-CM | POA: Diagnosis present

## 2019-02-03 DIAGNOSIS — R4182 Altered mental status, unspecified: Secondary | ICD-10-CM

## 2019-02-03 DIAGNOSIS — Z03818 Encounter for observation for suspected exposure to other biological agents ruled out: Secondary | ICD-10-CM | POA: Diagnosis not present

## 2019-02-03 DIAGNOSIS — I10 Essential (primary) hypertension: Secondary | ICD-10-CM

## 2019-02-03 DIAGNOSIS — Z79899 Other long term (current) drug therapy: Secondary | ICD-10-CM

## 2019-02-03 DIAGNOSIS — G250 Essential tremor: Secondary | ICD-10-CM | POA: Diagnosis present

## 2019-02-03 DIAGNOSIS — Z9841 Cataract extraction status, right eye: Secondary | ICD-10-CM

## 2019-02-03 DIAGNOSIS — E785 Hyperlipidemia, unspecified: Secondary | ICD-10-CM | POA: Diagnosis present

## 2019-02-03 DIAGNOSIS — Z91048 Other nonmedicinal substance allergy status: Secondary | ICD-10-CM

## 2019-02-03 DIAGNOSIS — I129 Hypertensive chronic kidney disease with stage 1 through stage 4 chronic kidney disease, or unspecified chronic kidney disease: Secondary | ICD-10-CM | POA: Diagnosis present

## 2019-02-03 DIAGNOSIS — Z9089 Acquired absence of other organs: Secondary | ICD-10-CM

## 2019-02-03 DIAGNOSIS — Z90710 Acquired absence of both cervix and uterus: Secondary | ICD-10-CM | POA: Diagnosis not present

## 2019-02-03 DIAGNOSIS — R221 Localized swelling, mass and lump, neck: Secondary | ICD-10-CM | POA: Diagnosis not present

## 2019-02-03 DIAGNOSIS — M8580 Other specified disorders of bone density and structure, unspecified site: Secondary | ICD-10-CM | POA: Diagnosis present

## 2019-02-03 DIAGNOSIS — Z8619 Personal history of other infectious and parasitic diseases: Secondary | ICD-10-CM

## 2019-02-03 DIAGNOSIS — E1122 Type 2 diabetes mellitus with diabetic chronic kidney disease: Secondary | ICD-10-CM | POA: Diagnosis present

## 2019-02-03 DIAGNOSIS — Z981 Arthrodesis status: Secondary | ICD-10-CM | POA: Diagnosis not present

## 2019-02-03 DIAGNOSIS — G4733 Obstructive sleep apnea (adult) (pediatric): Secondary | ICD-10-CM | POA: Diagnosis present

## 2019-02-03 DIAGNOSIS — K1120 Sialoadenitis, unspecified: Secondary | ICD-10-CM

## 2019-02-03 DIAGNOSIS — G2581 Restless legs syndrome: Secondary | ICD-10-CM | POA: Diagnosis present

## 2019-02-03 DIAGNOSIS — L7634 Postprocedural seroma of skin and subcutaneous tissue following other procedure: Secondary | ICD-10-CM | POA: Diagnosis present

## 2019-02-03 DIAGNOSIS — D72829 Elevated white blood cell count, unspecified: Secondary | ICD-10-CM | POA: Diagnosis not present

## 2019-02-03 DIAGNOSIS — Z881 Allergy status to other antibiotic agents status: Secondary | ICD-10-CM

## 2019-02-03 DIAGNOSIS — Z888 Allergy status to other drugs, medicaments and biological substances status: Secondary | ICD-10-CM

## 2019-02-03 DIAGNOSIS — A419 Sepsis, unspecified organism: Secondary | ICD-10-CM | POA: Diagnosis not present

## 2019-02-03 DIAGNOSIS — R404 Transient alteration of awareness: Secondary | ICD-10-CM | POA: Diagnosis not present

## 2019-02-03 DIAGNOSIS — Z91040 Latex allergy status: Secondary | ICD-10-CM

## 2019-02-03 DIAGNOSIS — Z886 Allergy status to analgesic agent status: Secondary | ICD-10-CM

## 2019-02-03 DIAGNOSIS — Z825 Family history of asthma and other chronic lower respiratory diseases: Secondary | ICD-10-CM

## 2019-02-03 DIAGNOSIS — K1121 Acute sialoadenitis: Secondary | ICD-10-CM | POA: Diagnosis present

## 2019-02-03 DIAGNOSIS — N183 Chronic kidney disease, stage 3 unspecified: Secondary | ICD-10-CM

## 2019-02-03 DIAGNOSIS — Z887 Allergy status to serum and vaccine status: Secondary | ICD-10-CM

## 2019-02-03 DIAGNOSIS — E8801 Alpha-1-antitrypsin deficiency: Secondary | ICD-10-CM | POA: Diagnosis present

## 2019-02-03 DIAGNOSIS — R0902 Hypoxemia: Secondary | ICD-10-CM | POA: Diagnosis not present

## 2019-02-03 DIAGNOSIS — Z820 Family history of epilepsy and other diseases of the nervous system: Secondary | ICD-10-CM

## 2019-02-03 DIAGNOSIS — Y831 Surgical operation with implant of artificial internal device as the cause of abnormal reaction of the patient, or of later complication, without mention of misadventure at the time of the procedure: Secondary | ICD-10-CM | POA: Diagnosis present

## 2019-02-03 DIAGNOSIS — R410 Disorientation, unspecified: Secondary | ICD-10-CM | POA: Diagnosis not present

## 2019-02-03 DIAGNOSIS — Z7951 Long term (current) use of inhaled steroids: Secondary | ICD-10-CM

## 2019-02-03 DIAGNOSIS — Z833 Family history of diabetes mellitus: Secondary | ICD-10-CM

## 2019-02-03 DIAGNOSIS — Z79890 Hormone replacement therapy (postmenopausal): Secondary | ICD-10-CM

## 2019-02-03 DIAGNOSIS — N1832 Chronic kidney disease, stage 3b: Secondary | ICD-10-CM

## 2019-02-03 DIAGNOSIS — Z7982 Long term (current) use of aspirin: Secondary | ICD-10-CM

## 2019-02-03 DIAGNOSIS — R52 Pain, unspecified: Secondary | ICD-10-CM | POA: Diagnosis not present

## 2019-02-03 DIAGNOSIS — M1812 Unilateral primary osteoarthritis of first carpometacarpal joint, left hand: Secondary | ICD-10-CM | POA: Diagnosis present

## 2019-02-03 DIAGNOSIS — Z9842 Cataract extraction status, left eye: Secondary | ICD-10-CM

## 2019-02-03 LAB — CBC WITH DIFFERENTIAL/PLATELET
Abs Immature Granulocytes: 0.26 10*3/uL — ABNORMAL HIGH (ref 0.00–0.07)
Basophils Absolute: 0.1 10*3/uL (ref 0.0–0.1)
Basophils Relative: 0 %
Eosinophils Absolute: 0.2 10*3/uL (ref 0.0–0.5)
Eosinophils Relative: 1 %
HCT: 26.8 % — ABNORMAL LOW (ref 36.0–46.0)
Hemoglobin: 8.6 g/dL — ABNORMAL LOW (ref 12.0–15.0)
Immature Granulocytes: 1 %
Lymphocytes Relative: 10 %
Lymphs Abs: 1.9 10*3/uL (ref 0.7–4.0)
MCH: 30.2 pg (ref 26.0–34.0)
MCHC: 32.1 g/dL (ref 30.0–36.0)
MCV: 94 fL (ref 80.0–100.0)
Monocytes Absolute: 1.9 10*3/uL — ABNORMAL HIGH (ref 0.1–1.0)
Monocytes Relative: 9 %
Neutro Abs: 15.6 10*3/uL — ABNORMAL HIGH (ref 1.7–7.7)
Neutrophils Relative %: 79 %
Platelets: 374 10*3/uL (ref 150–400)
RBC: 2.85 MIL/uL — ABNORMAL LOW (ref 3.87–5.11)
RDW: 13.7 % (ref 11.5–15.5)
WBC: 19.9 10*3/uL — ABNORMAL HIGH (ref 4.0–10.5)
nRBC: 0 % (ref 0.0–0.2)

## 2019-02-03 LAB — URINALYSIS, ROUTINE W REFLEX MICROSCOPIC
Bacteria, UA: NONE SEEN
Bilirubin Urine: NEGATIVE
Glucose, UA: NEGATIVE mg/dL
Ketones, ur: NEGATIVE mg/dL
Leukocytes,Ua: NEGATIVE
Nitrite: NEGATIVE
Protein, ur: NEGATIVE mg/dL
Specific Gravity, Urine: 1.016 (ref 1.005–1.030)
pH: 5 (ref 5.0–8.0)

## 2019-02-03 LAB — PROTIME-INR
INR: 1.3 — ABNORMAL HIGH (ref 0.8–1.2)
Prothrombin Time: 16.1 seconds — ABNORMAL HIGH (ref 11.4–15.2)

## 2019-02-03 LAB — COMPREHENSIVE METABOLIC PANEL
ALT: 9 U/L (ref 0–44)
AST: 12 U/L — ABNORMAL LOW (ref 15–41)
Albumin: 2.3 g/dL — ABNORMAL LOW (ref 3.5–5.0)
Alkaline Phosphatase: 79 U/L (ref 38–126)
Anion gap: 9 (ref 5–15)
BUN: 11 mg/dL (ref 8–23)
CO2: 22 mmol/L (ref 22–32)
Calcium: 8.1 mg/dL — ABNORMAL LOW (ref 8.9–10.3)
Chloride: 108 mmol/L (ref 98–111)
Creatinine, Ser: 1.29 mg/dL — ABNORMAL HIGH (ref 0.44–1.00)
GFR calc Af Amer: 48 mL/min — ABNORMAL LOW (ref >60)
GFR calc non Af Amer: 42 mL/min — ABNORMAL LOW (ref >60)
Glucose, Bld: 122 mg/dL — ABNORMAL HIGH (ref 70–99)
Potassium: 3.7 mmol/L (ref 3.5–5.1)
Sodium: 139 mmol/L (ref 135–145)
Total Bilirubin: 0.7 mg/dL (ref 0.3–1.2)
Total Protein: 5.5 g/dL — ABNORMAL LOW (ref 6.5–8.1)

## 2019-02-03 LAB — LACTIC ACID, PLASMA: Lactic Acid, Venous: 1.3 mmol/L (ref 0.5–1.9)

## 2019-02-03 LAB — SARS CORONAVIRUS 2 (TAT 6-24 HRS): SARS Coronavirus 2: NEGATIVE

## 2019-02-03 LAB — GLUCOSE, CAPILLARY: Glucose-Capillary: 99 mg/dL (ref 70–99)

## 2019-02-03 LAB — APTT: aPTT: 38 seconds — ABNORMAL HIGH (ref 24–36)

## 2019-02-03 MED ORDER — ACETAMINOPHEN 325 MG PO TABS
650.0000 mg | ORAL_TABLET | Freq: Four times a day (QID) | ORAL | Status: DC | PRN
  Administered 2019-02-04 – 2019-02-06 (×6): 650 mg via ORAL
  Filled 2019-02-03 (×6): qty 2

## 2019-02-03 MED ORDER — ASPIRIN 81 MG PO CHEW
81.0000 mg | CHEWABLE_TABLET | Freq: Every day | ORAL | Status: DC
  Administered 2019-02-03 – 2019-02-06 (×4): 81 mg via ORAL
  Filled 2019-02-03 (×4): qty 1

## 2019-02-03 MED ORDER — INSULIN ASPART 100 UNIT/ML ~~LOC~~ SOLN: 0.0000 [IU] | Freq: Three times a day (TID) | SUBCUTANEOUS | Status: DC

## 2019-02-03 MED ORDER — GABAPENTIN 400 MG PO CAPS
400.0000 mg | ORAL_CAPSULE | Freq: Two times a day (BID) | ORAL | Status: DC
  Administered 2019-02-03 – 2019-02-06 (×6): 400 mg via ORAL
  Filled 2019-02-03 (×6): qty 1

## 2019-02-03 MED ORDER — LEVOTHYROXINE SODIUM 75 MCG PO TABS
37.5000 ug | ORAL_TABLET | Freq: Every day | ORAL | Status: DC
  Administered 2019-02-04 – 2019-02-06 (×3): 37.5 ug via ORAL
  Filled 2019-02-03 (×3): qty 1

## 2019-02-03 MED ORDER — ALBUTEROL SULFATE (2.5 MG/3ML) 0.083% IN NEBU: 2.5000 mg | INHALATION_SOLUTION | Freq: Four times a day (QID) | RESPIRATORY_TRACT | Status: DC | PRN

## 2019-02-03 MED ORDER — PROPRANOLOL HCL 20 MG PO TABS
20.0000 mg | ORAL_TABLET | Freq: Two times a day (BID) | ORAL | Status: DC
  Administered 2019-02-03 – 2019-02-06 (×5): 20 mg via ORAL
  Filled 2019-02-03 (×8): qty 1

## 2019-02-03 MED ORDER — INSULIN ASPART 100 UNIT/ML ~~LOC~~ SOLN: 0.0000 [IU] | Freq: Every day | SUBCUTANEOUS | Status: DC

## 2019-02-03 MED ORDER — ENOXAPARIN SODIUM 40 MG/0.4ML ~~LOC~~ SOLN
40.0000 mg | SUBCUTANEOUS | Status: DC
  Administered 2019-02-03 – 2019-02-05 (×3): 40 mg via SUBCUTANEOUS
  Filled 2019-02-03 (×3): qty 0.4

## 2019-02-03 MED ORDER — BUPROPION HCL ER (XL) 150 MG PO TB24
150.0000 mg | ORAL_TABLET | Freq: Every day | ORAL | Status: DC
  Administered 2019-02-03 – 2019-02-06 (×4): 150 mg via ORAL
  Filled 2019-02-03 (×4): qty 1

## 2019-02-03 MED ORDER — PIPERACILLIN-TAZOBACTAM 3.375 G IVPB: 3.3750 g | Freq: Three times a day (TID) | INTRAVENOUS | Status: DC

## 2019-02-03 MED ORDER — SIMVASTATIN 20 MG PO TABS
20.0000 mg | ORAL_TABLET | Freq: Every evening | ORAL | Status: DC
  Administered 2019-02-03 – 2019-02-05 (×3): 20 mg via ORAL
  Filled 2019-02-03 (×3): qty 1

## 2019-02-03 MED ORDER — SODIUM CHLORIDE 0.9 % IV BOLUS
1000.0000 mL | Freq: Once | INTRAVENOUS | Status: AC
  Administered 2019-02-03: 1000 mL via INTRAVENOUS

## 2019-02-03 MED ORDER — MOMETASONE FURO-FORMOTEROL FUM 200-5 MCG/ACT IN AERO
2.0000 | INHALATION_SPRAY | Freq: Two times a day (BID) | RESPIRATORY_TRACT | Status: DC
  Administered 2019-02-04 – 2019-02-06 (×4): 2 via RESPIRATORY_TRACT
  Filled 2019-02-03 (×2): qty 8.8

## 2019-02-03 MED ORDER — ONDANSETRON HCL 4 MG/2ML IJ SOLN: 4.0000 mg | Freq: Four times a day (QID) | INTRAMUSCULAR | Status: DC | PRN

## 2019-02-03 MED ORDER — ACETAMINOPHEN 650 MG RE SUPP: 650.0000 mg | Freq: Four times a day (QID) | RECTAL | Status: DC | PRN

## 2019-02-03 MED ORDER — VANCOMYCIN HCL IN DEXTROSE 1-5 GM/200ML-% IV SOLN
1000.0000 mg | INTRAVENOUS | Status: DC
  Administered 2019-02-04: 1000 mg via INTRAVENOUS
  Filled 2019-02-03: qty 200

## 2019-02-03 MED ORDER — MIRTAZAPINE 15 MG PO TABS
30.0000 mg | ORAL_TABLET | Freq: Every day | ORAL | Status: DC
  Administered 2019-02-03 – 2019-02-05 (×3): 30 mg via ORAL
  Filled 2019-02-03 (×2): qty 2
  Filled 2019-02-03: qty 1
  Filled 2019-02-03: qty 2

## 2019-02-03 MED ORDER — SODIUM CHLORIDE 0.9 % IV SOLN
INTRAVENOUS | Status: AC
  Administered 2019-02-03: 19:00:00 via INTRAVENOUS

## 2019-02-03 MED ORDER — ONDANSETRON HCL 4 MG PO TABS: 4.0000 mg | ORAL_TABLET | Freq: Four times a day (QID) | ORAL | Status: DC | PRN

## 2019-02-03 MED ORDER — PIPERACILLIN-TAZOBACTAM 3.375 G IVPB
3.3750 g | Freq: Three times a day (TID) | INTRAVENOUS | Status: DC
  Administered 2019-02-03 – 2019-02-06 (×9): 3.375 g via INTRAVENOUS
  Filled 2019-02-03 (×12): qty 50

## 2019-02-03 MED ORDER — DULOXETINE HCL 60 MG PO CPEP
60.0000 mg | ORAL_CAPSULE | Freq: Two times a day (BID) | ORAL | Status: DC
  Administered 2019-02-03 – 2019-02-06 (×6): 60 mg via ORAL
  Filled 2019-02-03 (×7): qty 1

## 2019-02-03 MED ORDER — OXYCODONE HCL 5 MG PO TABS
5.0000 mg | ORAL_TABLET | ORAL | Status: DC | PRN
  Administered 2019-02-03 – 2019-02-06 (×11): 5 mg via ORAL
  Filled 2019-02-03 (×11): qty 1

## 2019-02-03 NOTE — Progress Notes (Signed)
Patient admitted to 5C-10. Alert and oriented x 4. C/o 10/10 L sided neck pain. PRN oxycodone administered. VSS. Denies any other complaints. Husband at bedside.

## 2019-02-03 NOTE — ED Provider Notes (Signed)
[facility] EMERGENCY DEPARTMENT Provider Note   CSN: [id number] Arrival date & time: 02/03/19  1042    LEVEL 5 CAVEAT - ALTERED MENTAL STATUS   History   Chief Complaint No chief complaint on file.   HPI Heather Espinoza is a 73 y.o. female.     HPI 73 year old female sent from an outside hospital for sepsis.  EMS is no longer present so the history is taken from the nurse and paperwork sent.  Apparently the patient had lumbar surgery last month.  Over the last couple days has been confused according to her facility and was sent to this outside ER.  She had work-up which included CT of the neck given erythema/cellulitis.  Found parotid otitis but no abscess.  There was also report of a possible MRI without contrast but could not tolerate with contrast.  This showed a collection near her surgery that was unable to determine if it was infectious or not.  Was trying to transfer patient to multiple different places.  They discussed with Mey[facility] of Neurosurgery, given concern for possible post op infection. Was transferred to ED.  She was treated with vancomycin and zosyn last night, most recent doses appear to be given prior to midnight (it is now 11 am).  Past Medical History:  Diagnosis Date   Arthritis    COPD (chronic obstructive pulmonary disease) (HCC)    Diabetes ([place])    Diverticulitis    Diverticulosis    Dyspnea    Fibromyalgia    High cholesterol    Hypertension    Hypothyroid    IBS (irritable bowel syndrome)    Kidney stone    Left Kidney   Osteopenia    Pneumonia    Restless leg    Sleep apnea    Doesn't use CPAP.     Tachycardia    per pt/fim   Tremor, essential 01/07/2018    Patient Active Problem List   Diagnosis Date Noted   Spondylolisthesis, lumbar region 01/12/2019   Tachycardia 01/07/2019   Syncope and collapse 01/07/2019   Palpitations 07/01/2018   SOB (shortness of breath) 07/01/2018   Educated about  COVID-19 virus infection 07/01/2018   Lobar pneumonia ([place]) 04/10/2018   Streptococcus pneumoniae pneumonia ([place]) 04/10/2018   Prolonged QT interval 04/10/2018   Tremor, essential 01/07/2018   Other dysphagia 12/21/2016   Primary osteoarthritis of first carpometacarpal joint of left hand 12/11/2016   Status post wrist surgery 12/11/2016   Chronic low back pain 10/07/2015   IBS (irritable bowel syndrome) 02/17/2013   GERD (gastroesophageal reflux disease) 02/17/2013   Depression 02/17/2013   Family hx of colon cancer 11/24/2012   Essential hypertension, benign 11/24/2012   High cholesterol 11/24/2012   Diabetes ([place]) 11/24/2012   Fibromyalgia 11/24/2012   Unspecified hypothyroidism 11/24/2012    Past Surgical History:  Procedure Laterality Date   APPENDECTOMY     BIOPSY  12/25/2016   Procedure: BIOPSY;  Surgeon: Rogene Houston, MD;  Location: [facility];  Service: Endoscopy;;  gastric    BREAST LUMPECTOMY Right 2001   CATARACT EXTRACTION Bilateral 2016   CHOLECYSTECTOMY     COLONOSCOPY N/A 12/17/2012   Procedure: COLONOSCOPY;  Surgeon: Rogene Houston, MD;  Location: [facility];  Service: Endoscopy;  Laterality: N/A;  215   complete hysterectomy     ESOPHAGEAL DILATION N/A 12/25/2016   Procedure: ESOPHAGEAL DILATION;  Surgeon: Rogene Houston, MD;  Location: [facility];  Service: Endoscopy;  Laterality: N/A;   ESOPHAGOGASTRODUODENOSCOPY N/A 12/25/2016   Procedure: ESOPHAGOGASTRODUODENOSCOPY (EGD);  Surgeon: Rogene Houston, MD;  Location: [facility];  Service: Endoscopy;  Laterality: N/A;  730   Heel tumor removed     rt elbow surgery     TONSILLECTOMY       OB History   No obstetric history on file.      Home Medications    Prior to Admission medications   Medication Sig Start Date End Date Taking? Authorizing Provider  albuterol (PROVENTIL HFA;VENTOLIN HFA) 108 (90 Base) MCG/ACT inhaler Inhale 2 puffs into the lungs every 6  (six) hours as needed for wheezing or shortness of breath.    [provider]  albuterol (PROVENTIL) (2.5 MG/3ML) 0.083% nebulizer solution Take 2.5 mg by nebulization every 6 (six) hours as needed for wheezing or shortness of breath.    [provider]  amLODipine (NORVASC) 5 MG tablet Take 1 tablet (5 mg total) by mouth daily. 01/08/19   Minus Breeding, MD  aspirin 81 MG chewable tablet Chew 81 mg by mouth daily.    [provider]  Biotin 10 MG TABS Take 10 mg by mouth daily.     [provider]  bisacodyl (DULCOLAX) 10 MG suppository Place 1 suppository (10 mg total) rectally as needed for moderate constipation. 01/04/15   Rehman, Mechele Dawley, MD  buPROPion (WELLBUTRIN XL) 150 MG 24 hr tablet Take 150 mg by mouth daily.  04/08/18   [provider]  cholecalciferol (VITAMIN D) 1000 units tablet Take 1,000 Units by mouth daily.     [provider]  Cyanocobalamin (B-12) 2500 MCG SUBL Place 2,500 mcg under the tongue daily.    [provider]  cyclobenzaprine (FLEXERIL) 10 MG tablet Take 1 tablet (10 mg total) by mouth 3 (three) times daily as needed for muscle spasms. 01/14/19   Newman Pies, MD  dicyclomine (BENTYL) 10 MG capsule TAKE 1 CAPSULE BY MOUTH TWICE DAILY AS NEEDED FOR  SPASMS Patient taking differently: Take 20 mg by mouth 3 (three) times daily as needed for spasms.  12/04/18   Noralyn Pick, NP  docusate sodium (COLACE) 100 MG capsule Take 2 capsules (200 mg total) by mouth at bedtime. Patient taking differently: Take 200 mg by mouth daily.  01/04/15   Rehman, Mechele Dawley, MD  DULoxetine (CYMBALTA) 60 MG capsule Take 1 capsule by mouth twice daily Patient taking differently: Take 60 mg by mouth 2 (two) times daily.  10/06/18   Kathrynn Ducking, MD  esomeprazole (NEXIUM) 20 MG capsule Take 40 mg by mouth 2 (two) times daily before a meal.     [provider]  esomeprazole (NEXIUM) 40 MG capsule Take 1  capsule (40 mg total) by mouth daily before breakfast. 07/15/18   Rehman, Mechele Dawley, MD  famotidine (PEPCID) 40 MG tablet TAKE 1 TABLET BY MOUTH AT BEDTIME Patient taking differently: Take 40 mg by mouth at bedtime.  12/04/18   Noralyn Pick, NP  Fluticasone-Salmeterol (ADVAIR) 250-50 MCG/DOSE AEPB Inhale 1 puff into the lungs every 12 (twelve) hours.    [provider]  gabapentin (NEURONTIN) 400 MG capsule Take 400 mg by mouth 2 (two) times daily.  06/30/18   [provider]  GELATIN PO Take 1 capsule by mouth daily. After a meal    [provider]  levothyroxine (SYNTHROID, LEVOTHROID) 75 MCG tablet Take 37.5 mcg by mouth daily before breakfast.     [provider]  lisinopril (PRINIVIL,ZESTRIL) 10 MG tablet Take 10 mg by mouth daily.     [provider]  lubiprostone (AMITIZA) 8 MCG capsule Take 2 capsules (16 mcg total) by mouth daily with breakfast. Patient taking differently: Take 8 mcg by mouth 2 (two) times daily as needed for constipation.  12/04/16   Rogene Houston, MD  metformin (FORTAMET) 500 MG (OSM) 24 hr tablet Take 500 mg by mouth every evening.     [provider]  metoprolol succinate (TOPROL-XL) 25 MG 24 hr tablet Take 12.5 mg by mouth daily.  04/08/18   [provider]  mirtazapine (REMERON) 30 MG tablet Take 30 mg by mouth at bedtime.  06/06/18   [provider]  ondansetron (ZOFRAN) 4 MG tablet TAKE 1 TABLET BY MOUTH TWICE DAILY AS NEEDED FOR NAUSEA FOR VOMITING Patient taking differently: Take 4 mg by mouth every 6 (six) hours as needed for nausea.  12/31/18   Rehman, Mechele Dawley, MD  oxyCODONE 10 MG TABS Take 1 tablet (10 mg total) by mouth every 4 (four) hours as needed for severe pain ((score 7 to 10)). 01/14/19   Newman Pies, MD  propranolol (INDERAL) 20 MG tablet Take 1 tablet (20 mg total) by mouth 2 (two) times daily. 07/28/18   Kathrynn Ducking, MD  simvastatin (ZOCOR) 20 MG tablet Take 20  mg by mouth every evening.    [provider]  traMADol (ULTRAM) 50 MG tablet Take 50 mg by mouth daily as needed for moderate pain.  10/19/16   [provider]  traZODone (DESYREL) 100 MG tablet Take 100 mg by mouth at bedtime as needed for sleep.     [provider]  vitamin E 400 UNIT capsule Take 400 Units by mouth daily.    [provider]    Family History Family History  Problem Relation Age of Onset   Uterine cancer Mother    Parkinson's disease Father    Stroke Father    Colon cancer Brother    Lung cancer Brother    COPD Sister    Diabetes Grandchild    Asthma Son     Social History Social History   Tobacco Use   Smoking status: Never Smoker   Smokeless tobacco: Never Used  Substance Use Topics   Alcohol use: No    Alcohol/week: 0.0 standard drinks   Drug use: No     Allergies   Flagyl [metronidazole], Lyrica [pregabalin], Adhesive [tape], Aspirin, Latex, Phenergan [promethazine hcl], Prednisone, Sulfa antibiotics, and Tetanus toxoids   Review of Systems Review of Systems  Unable to perform ROS: Mental status change     Physical Exam Updated Vital Signs BP (!) 120/52    Pulse (!) 58    Temp 98.6 F (37 C) (Oral)    Resp (!) 21    Ht 5' 1.5" (1.562 m)    Wt 63.5 kg    SpO2 95%    BMI 26.02 kg/m   Physical Exam Vitals signs and nursing note reviewed.  Constitutional:      Appearance: She is well-developed.     Comments: Lethargic, though does awaken to voice. Appears altered.  HENT:     Head: Normocephalic and atraumatic.     Right Ear: External ear normal.     Left Ear: External ear normal.     Nose: Nose normal.  Eyes:     General:        Right eye: No discharge.  Left eye: No discharge.  Neck:   Cardiovascular:     Rate and Rhythm: Normal rate and regular rhythm.     Heart sounds: Normal heart sounds.  Pulmonary:     Effort: Pulmonary effort is normal.     Breath sounds: Normal breath  sounds.  Abdominal:     Palpations: Abdomen is soft.     Tenderness: There is no abdominal tenderness.  Musculoskeletal:     Lumbar back: She exhibits no tenderness.       Back:  Skin:    General: Skin is warm and dry.  Neurological:     Mental Status: She is lethargic.     Comments: Awake, alert but confused to current situation. Knows it's November but is wrong on year (2002). 5/5 strength in all 4 extremities  Psychiatric:        Mood and Affect: Mood is not anxious.        Behavior: Behavior is uncooperative.      ED Treatments / Results  Labs (all labs ordered are listed, but only abnormal results are displayed) Labs Reviewed  COMPREHENSIVE METABOLIC PANEL - Abnormal; Notable for the following components:      Result Value   Glucose, Bld 122 (*)    Creatinine, Ser 1.29 (*)    Calcium 8.1 (*)    Total Protein 5.5 (*)    Albumin 2.3 (*)    AST 12 (*)    GFR calc non Af Amer 42 (*)    GFR calc Af Amer 48 (*)    All other components within normal limits  CBC WITH DIFFERENTIAL/PLATELET - Abnormal; Notable for the following components:   WBC 19.9 (*)    RBC 2.85 (*)    Hemoglobin 8.6 (*)    HCT 26.8 (*)    Neutro Abs 15.6 (*)    Monocytes Absolute 1.9 (*)    Abs Immature Granulocytes 0.26 (*)    All other components within normal limits  APTT - Abnormal; Notable for the following components:   aPTT 38 (*)    All other components within normal limits  PROTIME-INR - Abnormal; Notable for the following components:   Prothrombin Time 16.1 (*)    INR 1.3 (*)    All other components within normal limits  CULTURE, BLOOD (ROUTINE X 2)  CULTURE, BLOOD (ROUTINE X 2)  URINE CULTURE  SARS CORONAVIRUS 2 (TAT 6-24 HRS)  LACTIC ACID, PLASMA  URINALYSIS, ROUTINE W REFLEX MICROSCOPIC    EKG EKG Interpretation  Date/Time:  Tuesday February 03 2019 10:48:53 EST Ventricular Rate:  67 PR Interval:    QRS Duration: 84 QT Interval:  436 QTC Calculation: 461 R  Axis:   38 Text Interpretation: Sinus rhythm Low voltage, precordial leads Abnormal R-wave progression, early transition no significant change since Jan 2020 arm lead reversal from earlier is corrected Confirmed by Sherwood Gambler [phone]) on 02/03/2019 10:50:31 AM Also confirmed by Sherwood Gambler [phone]), editor Lynder Parents [phone])  on 02/03/2019 10:52:49 AM   Radiology No results found.  Procedures Procedures (including critical care time)  Medications Ordered in ED Medications  vancomycin (VANCOCIN) IVPB 1000 mg/200 mL premix (has no administration in time range)  piperacillin-tazobactam (ZOSYN) IVPB 3.375 g (3.375 g Intravenous New Bag/Given 02/03/19 1305)  sodium chloride 0.9 % bolus 1,000 mL (1,000 mLs Intravenous New Bag/Given 02/03/19 1146)     Initial Impression / Assessment and Plan / ED Course  I have reviewed the triage vital signs and the nursing notes.  Pertinent labs & imaging results that were available during my care of the patient were reviewed by me and considered in my medical decision making (see chart for details).        Patient's white count was over 20,000 at the outside hospital.  Lactate has been okay.  Other labs are fairly benign besides anemia.  She is altered and daughter at the bedside confirms this but is not having difficulty protecting airway.  She appears stable for medical admission.  Dr. Loleta Books will admit.  I have also consulted Dr. Arnoldo Morale of neurosurgery who is aware of patient and feels that the fluid collection on L-spine MRI is likely seroma but he will evaluate while she is in the hospital.  Final Clinical Impressions(s) / ED Diagnoses   Final diagnoses:  Acute parotitis  Altered mental status, unspecified altered mental status type    ED Discharge Orders    None       Sherwood Gambler, MD 02/03/19 1328

## 2019-02-03 NOTE — Progress Notes (Signed)
Pharmacy Antibiotic Note  Heather Espinoza is a 73 y.o. female admitted on 02/03/2019 with sepsis.  Pharmacy has been consulted for vancomycin and zosyn dosing.  S/p lumbar decompression/fusion two weeks ago, became altered 2d ago.   Transfer from [facility], received Vanc 1g IV x1 11/16 @1900 , and zosyn 2.25g IV last dose 11/17 @0600   Plan: Vancomycin 1000 mg IV Q 36 hrs. Goal AUC 400-550. Expected AUC: 475 SCr used: 1.26 Zosyn 3.375g IV every 8 hours (extended infusion) Monitor renal function, Cx and clinical progression to narrow Vancomycin levels as needed  Height: 5' 1.5" (156.2 cm) Weight: 140 lb (63.5 kg) IBW/kg (Calculated) : 48.95  Temp (24hrs), Avg:98.6 F (37 C), Min:98.6 F (37 C), Max:98.6 F (37 C)  No results for input(s): WBC, CREATININE, LATICACIDVEN, VANCOTROUGH, VANCOPEAK, VANCORANDOM, GENTTROUGH, GENTPEAK, GENTRANDOM, TOBRATROUGH, TOBRAPEAK, TOBRARND, AMIKACINPEAK, AMIKACINTROU, AMIKACIN in the last 168 hours.  CrCl cannot be calculated (Patient's most recent lab result is older than the maximum 21 days allowed.).    Allergies  Allergen Reactions  . Flagyl [Metronidazole] Shortness Of Breath and Swelling  . Lyrica [Pregabalin] Shortness Of Breath  . Adhesive [Tape] Other (See Comments)    Takes off patient's skin  . Aspirin Other (See Comments)    Stomach bleed  . Latex Itching and Rash  . Phenergan [Promethazine Hcl] Other (See Comments)    Caused grogginess, altered mental status  . Prednisone Anxiety  . Sulfa Antibiotics Nausea Only  . Tetanus Toxoids Swelling    Arm Area    Bertis Ruddy, PharmD Clinical Pharmacist Please check AMION for all [place] numbers 02/03/2019 11:07 AM

## 2019-02-03 NOTE — H&P (Signed)
History and Physical  Patient Name: Heather Espinoza     [id number]    DOB: June 19, 1945    DOA: 02/03/2019 PCP: London Pepper, MD  Patient coming from: Home ---[facility]  Chief Complaint: Face swelling, confusion      HPI: Heather Espinoza is a 73 y.o. F with hx OPD with alpha-1 antitrypsin deficiency, not on oxygen, DM, HTN, hypothyroidism, CKD 3, baseline creatinine 1.2, OSA not on CPAP, and recent L4-5 decompression and fusion who presents with face swelling and confusion.  The patient was in her usual state of health (walking without a walker, no dementia, lives at home with her husband, had recuperated well from her recent lumbar fusion) until Sunday, she woke with new left-sided pain in the face and swelling.  She was seen in the ER, prescribed Augmentin and discharged home.  Woke Monday very confused, progressively more confused over the course of the day, so called EMS in the evening.  [place] of the head showed left-sided parotitis, no fluid collection, surrounding cellulitis.  She was given vancomycin, Zosyn, and IV fluids.  Family requested [facility].       ROS: Review of Systems  Constitutional: Positive for chills and malaise/fatigue. Negative for fever.  HENT: Negative for congestion, sinus pain and sore throat.        Dry mouth, left-sided facial swelling and pain  Respiratory: Negative for cough, hemoptysis, sputum production, shortness of breath, wheezing and stridor.   All other systems reviewed and are negative.         Past Medical History:  Diagnosis Date   Arthritis    COPD (chronic obstructive pulmonary disease) (HCC)    Diabetes ([place])    Diverticulitis    Diverticulosis    Dyspnea    Fibromyalgia    High cholesterol    Hypertension    Hypothyroid    IBS (irritable bowel syndrome)    Kidney stone    Left Kidney   Osteopenia    Pneumonia    Restless leg      Sleep apnea    Doesn't use CPAP.     Tachycardia    per pt/fim   Tremor, essential 01/07/2018    Past Surgical History:  Procedure Laterality Date   APPENDECTOMY     BIOPSY  12/25/2016   Procedure: BIOPSY;  Surgeon: Rogene Houston, MD;  [facility];  Service: Endoscopy;;  gastric    BREAST LUMPECTOMY Right 2001   CATARACT EXTRACTION Bilateral 2016   CHOLECYSTECTOMY     COLONOSCOPY N/A 12/17/2012   Procedure: COLONOSCOPY;  Surgeon: Rogene Houston, MD;  [facility];  Service: Endoscopy;  Laterality: N/A;  215   complete hysterectomy     ESOPHAGEAL DILATION N/A 12/25/2016   Procedure: ESOPHAGEAL DILATION;  Surgeon: Rogene Houston, MD;  [facility];  Service: Endoscopy;  Laterality: N/A;   ESOPHAGOGASTRODUODENOSCOPY N/A 12/25/2016   Procedure: ESOPHAGOGASTRODUODENOSCOPY (EGD);  Surgeon: Rogene Houston, MD;  [facility];  Service: Endoscopy;  Laterality: N/A;  730   Heel tumor removed     rt elbow surgery     TONSILLECTOMY      Social History: Patient lives with her husband who is a Loss adjuster, chartered.  The patient walks unassisted.  Nonsmoker.  Allergies  Allergen Reactions   Flagyl [Metronidazole] Shortness Of Breath and Swelling   Lyrica [Pregabalin] Shortness  Of Breath   Adhesive [Tape] Other (See Comments)    Takes off patient's skin   Aspirin Other (See Comments)    Stomach bleed   Latex Itching and Rash   Phenergan [Promethazine Hcl] Other (See Comments)    Caused grogginess, altered mental status   Prednisone Anxiety   Sulfa Antibiotics Nausea Only   Tetanus Toxoids Swelling    Arm Area    Family history: family history includes Asthma in her son; COPD in her sister; Colon cancer in her brother; Diabetes in her grandchild; Lung cancer in her brother; Parkinson's disease in her father; Stroke in her father; Uterine cancer in her mother.  Prior to Admission medications   Medication Sig  Start Date End Date Taking? Authorizing Provider  albuterol (PROVENTIL HFA;VENTOLIN HFA) 108 (90 Base) MCG/ACT inhaler Inhale 2 puffs into the lungs every 6 (six) hours as needed for wheezing or shortness of breath.   Yes [provider]  albuterol (PROVENTIL) (2.5 MG/3ML) 0.083% nebulizer solution Take 2.5 mg by nebulization every 6 (six) hours as needed for wheezing or shortness of breath.   Yes [provider]  amLODipine (NORVASC) 5 MG tablet Take 1 tablet (5 mg total) by mouth daily. 01/08/19  Yes Minus Breeding, MD  aspirin 81 MG chewable tablet Chew 81 mg by mouth daily.   Yes [provider]  Biotin 10 MG TABS Take 10 mg by mouth daily.    Yes [provider]  bisacodyl (DULCOLAX) 10 MG suppository Place 1 suppository (10 mg total) rectally as needed for moderate constipation. 01/04/15  Yes Rehman, Mechele Dawley, MD  buPROPion (WELLBUTRIN XL) 150 MG 24 hr tablet Take 150 mg by mouth daily.  04/08/18  Yes [provider]  cholecalciferol (VITAMIN D) 1000 units tablet Take 1,000 Units by mouth daily.    Yes [provider]  Cyanocobalamin (B-12) 2500 MCG SUBL Place 2,500 mcg under the tongue daily.   Yes [provider]  cyclobenzaprine (FLEXERIL) 10 MG tablet Take 1 tablet (10 mg total) by mouth 3 (three) times daily as needed for muscle spasms. 01/14/19  Yes Newman Pies, MD  dicyclomine (BENTYL) 10 MG capsule TAKE 1 CAPSULE BY MOUTH TWICE DAILY AS NEEDED FOR  SPASMS Patient taking differently: Take 20 mg by mouth 3 (three) times daily as needed for spasms.  12/04/18  Yes Noralyn Pick, NP  docusate sodium (COLACE) 100 MG capsule Take 2 capsules (200 mg total) by mouth at bedtime. Patient taking differently: Take 200 mg by mouth daily.  01/04/15  Yes Rehman, Mechele Dawley, MD  DULoxetine (CYMBALTA) 60 MG capsule Take 1 capsule by mouth twice daily Patient taking differently: Take 60 mg by mouth 2 (two) times daily.  10/06/18   Yes Kathrynn Ducking, MD  esomeprazole (NEXIUM) 40 MG capsule Take 1 capsule (40 mg total) by mouth daily before breakfast. 07/15/18  Yes Rehman, Mechele Dawley, MD  famotidine (PEPCID) 40 MG tablet TAKE 1 TABLET BY MOUTH AT BEDTIME Patient taking differently: Take 40 mg by mouth at bedtime.  12/04/18  Yes Noralyn Pick, NP  Fluticasone-Salmeterol (ADVAIR) 250-50 MCG/DOSE AEPB Inhale 1 puff into the lungs every 12 (twelve) hours.   Yes [provider]  gabapentin (NEURONTIN) 400 MG capsule Take 400 mg by mouth 2 (two) times daily.  06/30/18  Yes [provider]  GELATIN PO Take 1 capsule by mouth daily. After a meal   Yes [provider]  levothyroxine (SYNTHROID, [place]) 75 MCG  tablet Take 37.5 mcg by mouth daily before breakfast.    Yes [provider]  lisinopril (PRINIVIL,ZESTRIL) 10 MG tablet Take 10 mg by mouth daily.    Yes [provider]  lubiprostone (AMITIZA) 8 MCG capsule Take 2 capsules (16 mcg total) by mouth daily with breakfast. Patient taking differently: Take 8 mcg by mouth 2 (two) times daily as needed for constipation.  12/04/16  Yes Rehman, Mechele Dawley, MD  metFORMIN (GLUCOPHAGE-XR) 500 MG 24 hr tablet Take 500 mg by mouth at bedtime. 01/12/19  Yes [provider]  mirtazapine (REMERON) 30 MG tablet Take 30 mg by mouth at bedtime.  06/06/18  Yes [provider]  ondansetron (ZOFRAN) 4 MG tablet TAKE 1 TABLET BY MOUTH TWICE DAILY AS NEEDED FOR NAUSEA FOR VOMITING Patient taking differently: Take 4 mg by mouth every 6 (six) hours as needed for nausea.  12/31/18  Yes Rehman, Mechele Dawley, MD  oxyCODONE (OXY IR/ROXICODONE) 5 MG immediate release tablet Take 5 mg by mouth every 4 (four) hours as needed for pain. 01/14/19  Yes [provider]  propranolol (INDERAL) 20 MG tablet Take 1 tablet (20 mg total) by mouth 2 (two) times daily. 07/28/18  Yes Kathrynn Ducking, MD  simvastatin (ZOCOR) 20 MG tablet Take 20 mg by  mouth every evening.   Yes [provider]  traMADol (ULTRAM) 50 MG tablet Take 50 mg by mouth daily as needed for moderate pain.  10/19/16  Yes [provider]  traZODone (DESYREL) 100 MG tablet Take 100 mg by mouth at bedtime as needed for sleep.    Yes [provider]  vitamin E 400 UNIT capsule Take 400 Units by mouth daily.   Yes [provider]       Physical Exam: BP 131/79    Pulse 63    Temp 98.6 F (37 C) (Oral)    Resp 17    Ht 5' 1.5" (1.562 m)    Wt 63.5 kg    SpO2 95%    BMI 26.02 kg/m  General appearance: Well-developed, elderly adult female, alert and in no acute distress.   Eyes: Anicteric, conjunctiva pink, lids and lashes normal. PERRL.    ENT: No nasal deformity, discharge, epistaxis.  Hearing normal. OP very dry with crusted thick exudate on the tongue without lesions.  No oozing from her left parotid Neck: She has a left-sided parotid mass, which is firm, has some overlying redness, and is exquisitely tender.    Trachea midline.  No thyromegaly/tenderness. Skin: Warm and dry.  No jaundice.  No suspicious rashes or lesions.  Redness overlying her left parotid. Cardiac: RRR, nl S1-S2, no murmurs appreciated.  Capillary refill is brisk.  JVPnot visible.  No LE edema.  Radial pulses 2+ and symmetric. Respiratory: Normal respiratory rate and rhythm.  CTAB without rales or wheezes. Abdomen: Abdomen soft.  No TTP or guarding. No ascites, distension, hepatosplenomegaly.   MSK: No deformities or effusions of the large joints of the upper or lower extremities bilaterally.  No cyanosis or clubbing. Neuro: Cranial nerves normal.  Sensation intact to light touch. Speech is fluent.  Muscle strength normal, 5/5, symmetric.    Psych: Sensorium intact and responding to questions, attention normal.  Behavior appropriate.  Affect normal.  Judgment and insight appear normal.     Labs on Admission:  I have personally reviewed following labs and imaging  studies: CBC: Recent Labs  Lab 02/03/19 1133  WBC 19.9*  NEUTROABS 15.6*  HGB 8.6*  HCT 26.8*  MCV 94.0  PLT [id number]   Basic Metabolic Panel: Recent Labs  Lab 02/03/19 1133  NA 139  K 3.7  CL 108  CO2 22  GLUCOSE 122*  BUN 11  CREATININE 1.29*  CALCIUM 8.1*   GFR: Estimated Creatinine Clearance: 34.6 mL/min (A) (by C-G formula based on SCr of 1.29 mg/dL (H)).  Liver Function Tests: Recent Labs  Lab 02/03/19 1133  AST 12*  ALT 9  ALKPHOS 79  BILITOT 0.7  PROT 5.5*  ALBUMIN 2.3*    Coagulation Profile: Recent Labs  Lab 02/03/19 1133  INR 1.3*     Sepsis Labs: Lactic acid 1.3  Recent Results (from the past 240 hour(s))  SARS CORONAVIRUS 2 (TAT 6-24 HRS) Nasopharyngeal Nasopharyngeal Swab     Status: None   Collection Time: 02/03/19 11:19 AM   Specimen: Nasopharyngeal Swab  Result Value Ref Range Status   SARS Coronavirus 2 NEGATIVE NEGATIVE Final    Comment: (NOTE) SARS-CoV-2 target nucleic acids are NOT DETECTED. The SARS-CoV-2 RNA is generally detectable in upper and lower respiratory specimens during the acute phase of infection. Negative results do not preclude SARS-CoV-2 infection, do not rule out co-infections with other pathogens, and should not be used as the sole basis for treatment or other patient management decisions. Negative results must be combined with clinical observations, patient history, and epidemiological information. The expected result is Negative. Fact Sheet for Patients: SugarRoll.be Fact Sheet for Healthcare Providers: https://www.woods-mathews.com/ This test is not yet approved or cleared by the [place] FDA and  has been authorized for detection and/or diagnosis of SARS-CoV-2 by FDA under an Emergency Use Authorization (EUA). This EUA will remain  in effect (meaning this test can be used) for the duration of the COVID-19 declaration under Section 56 4(b)(1) of the Act, 21  U.S.C. section 360bbb-3(b)(1), unless the authorization is terminated or revoked sooner. Performed at [facility], [place] [address], [place], [place] [address]            Radiological Exams on Admission: Personally reviewed outside CT neck report which showed left-sided parotid gland homogenous enhancement, no fluid collection, and diffuse cellulitis surrounding:   EKG: Independently reviewed.  Personally reviewed, showed decreased voltages, rate 67, sinus rhythm, no ST changes QTC 461  Echocardiogram from 2019, report reviewed EF normal, no evidence of vegetations on the valves     Assessment/Plan   Parotitis  Facial cellulitis There did not appear to be suppurative complications.  She is not septic at present.  This is improved, but her white blood cell count is still near 20,000, and she still exquisitely painful, with fairly extensive facial cellulitis. -Continue vancomycin, Zosyn -Soft diet -Trend CBC   Acute metabolic encephalopathy That appears resolved.  Diabetes -Hold home metformin -Start sliding scale correction insulin -Continue simvastatin  Hypertension Blood pressure normal here -Hold lisinopril and amlodipine until hemodynamics are clear -Continue propanolol for now  CKD IIIb Stable relative to baseline  Recent lumbar surgery There is a seroma on MRI L-spine that was obtained at the outside facility.  Neurosurgery were contacted as a courtesy, and appreciate their recommendations.  This is not related to her current presentation.  COPD Chart history of alpha-1 antitrypsin deficiency, no active bronchospasm on exam.  Does not use oxygen at baseline.  Anemia This appears to be a chronic normocytic anemia, with a superimposed acute blood loss after surgery. -Check ferritin and iron studies -Check reticulocytes -Check B12  Hypothyroidism  Sleep apnea Does not use CPAP  Fibromyalgia -Continue Wellbutrin, Cymbalta, mirtazapine  Normal  QT interval          DVT prophylaxis: Lovenox Code Status: Full code Family Communication: Daughter at the bedside Disposition Plan: Anticipate IV antibiotics, will need to trend her WBC, and ensure that she is able to take food by mouth. Consults called: Neurosurgery Admission status: Inpatient     Medical decision making: Patient seen at 6:02 PM on 02/03/2019.  The patient was discussed with Dr. Verner Chol.  What exists of the patient's chart was reviewed in depth and outside records from [facility] were reviewed and summarized above.  Clinical condition: Stable.        [facility] Please page though [place] or Epic secure chat:  For password, contact charge nurse      At the time of admission, it appears that the appropriate admission status for this patient is INPATIENT. This is judged to be reasonable and necessary in order to provide the required intensity of service to ensure the patient's safety given: -presenting signs and symptoms of facial pain, redness, and swelling as well as confusion -initial radiographic and laboratory data parotitis by CT, leukocytosis -in the context of their chronic comorbidities diabetes, COPD, hypertension, hypothyroidism, CKD    Together, these circumstances are felt to place her at high risk for further clinical deterioration threatening life, limb, or organ requiring a high intensity of service due to this acute illness that poses a threat to life, limb or bodily function.  I certify that at the point of admission it is my clinical judgment that the patient will require inpatient hospital care spanning beyond 2 midnights from the point of admission and that early discharge would result in unnecessary risk of decompensation and readmission or threat to life, limb or bodily function.

## 2019-02-03 NOTE — ED Triage Notes (Signed)
Pt transfer here from [facility] in [place]. [place]. Pt had back surgery two weeks ago and became AMS approx 2 days ago. Thought she might have abscess near surgical site. Pt reported to have had fever. Pt has a red rash a swollen on left side. Pt received Vanc and Zosyn at hospital and fluids to treat for sepsis.  Pt Alert to self and month.

## 2019-02-03 NOTE — Consult Note (Addendum)
Providing Compassionate, Quality Care - Together   Reason for Consult: Recent lumbar fusion Referring Physician: Dr. Doreen Beam Heather Espinoza is an 73 y.o. female.  HPI: Heather Espinoza past medical history is listed below. According to the patient's daughter, the patient was in her usual state of health until two days ago. At that time, she developed altered mental status. She was noted to have swelling and tenderness on the left side of her face and into her neck. She was in the expected amount of pain in her lower back following a lumbar fusion. She is three weeks status post L4-5 posterior lateral interbody fusion. Patient reports left-sided facial/neck pain and low back pain. Her daughter reports no increase in low back pain or incisional tenderness. There has been no drainage from the patient's incision. She is afebrile. She was started on Vancomycin and Zosyn at [facility]. Per report, imaging of her neck revealed parotid otitis, without abscess. MRI was done without contrast. This demonstrated a fluid collection near her surgical site. Neurosurgery was asked to consult for further evaluation and recommendation.  Past Medical History:  Diagnosis Date  . Arthritis   . COPD (chronic obstructive pulmonary disease) ([place])   . Diabetes ([place])   . Diverticulitis   . Diverticulosis   . Dyspnea   . Fibromyalgia   . High cholesterol   . Hypertension   . Hypothyroid   . IBS (irritable bowel syndrome)   . Kidney stone    Left Kidney  . Osteopenia   . Pneumonia   . Restless leg   . Sleep apnea    Doesn't use CPAP.    Marland Kitchen Tachycardia    per pt/fim  . Tremor, essential 01/07/2018    Past Surgical History:  Procedure Laterality Date  . APPENDECTOMY    . BIOPSY  12/25/2016   Procedure: BIOPSY;  Surgeon: Rogene Houston, MD;  [facility];  Service: Endoscopy;;  gastric   . BREAST LUMPECTOMY Right 2001  . CATARACT EXTRACTION Bilateral 2016  . CHOLECYSTECTOMY     . COLONOSCOPY N/A 12/17/2012   Procedure: COLONOSCOPY;  Surgeon: Rogene Houston, MD;  [facility];  Service: Endoscopy;  Laterality: N/A;  215  . complete hysterectomy    . ESOPHAGEAL DILATION N/A 12/25/2016   Procedure: ESOPHAGEAL DILATION;  Surgeon: Rogene Houston, MD;  [facility];  Service: Endoscopy;  Laterality: N/A;  . ESOPHAGOGASTRODUODENOSCOPY N/A 12/25/2016   Procedure: ESOPHAGOGASTRODUODENOSCOPY (EGD);  Surgeon: Rogene Houston, MD;  [facility];  Service: Endoscopy;  Laterality: N/A;  730  . Heel tumor removed    . rt elbow surgery    . TONSILLECTOMY      Family History  Problem Relation Age of Onset  . Uterine cancer Mother   . Parkinson's disease Father   . Stroke Father   . Colon cancer Brother   . Lung cancer Brother   . COPD Sister   . Diabetes Grandchild   . Asthma Son     Social History:  reports that she has never smoked. She has never used smokeless tobacco. She reports that she does not drink alcohol or use drugs.  Allergies:  Allergies  Allergen Reactions  . Flagyl [Metronidazole] Shortness Of Breath and Swelling  . Lyrica [Pregabalin] Shortness Of Breath  . Adhesive [Tape] Other (See Comments)    Takes off patient's skin  . Aspirin Other (See Comments)    Stomach bleed  .  Latex Itching and Rash  . Phenergan [Promethazine Hcl] Other (See Comments)    Caused grogginess, altered mental status  . Prednisone Anxiety  . Sulfa Antibiotics Nausea Only  . Tetanus Toxoids Swelling    Arm Area    Medications: I have reviewed the patient's current medications.  Results for orders placed or performed during the hospital encounter of 02/03/19 (from the past 48 hour(s))  Comprehensive metabolic panel     Status: Abnormal   Collection Time: 02/03/19 11:33 AM  Result Value Ref Range   Sodium 139 135 - 145 mmol/L   Potassium 3.7 3.5 - 5.1 mmol/L   Chloride 108 98 - 111 mmol/L   CO2 22 22 - 32 mmol/L   Glucose, Bld 122  (H) 70 - 99 mg/dL   BUN 11 8 - 23 mg/dL   Creatinine, Ser 1.29 (H) 0.44 - 1.00 mg/dL   Calcium 8.1 (L) 8.9 - 10.3 mg/dL   Total Protein 5.5 (L) 6.5 - 8.1 g/dL   Albumin 2.3 (L) 3.5 - 5.0 g/dL   AST 12 (L) 15 - 41 U/L   ALT 9 0 - 44 U/L   Alkaline Phosphatase 79 38 - 126 U/L   Total Bilirubin 0.7 0.3 - 1.2 mg/dL   GFR calc non Af Amer 42 (L) >60 mL/min   GFR calc Af Amer 48 (L) >60 mL/min   Anion gap 9 5 - 15    Comment: Performed at [facility], [address] [place], [place] [address]  CBC WITH DIFFERENTIAL     Status: Abnormal   Collection Time: 02/03/19 11:33 AM  Result Value Ref Range   WBC 19.9 (H) 4.0 - 10.5 K/uL   RBC 2.85 (L) 3.87 - 5.11 MIL/uL   Hemoglobin 8.6 (L) 12.0 - 15.0 g/dL   HCT 26.8 (L) 36.0 - 46.0 %   MCV 94.0 80.0 - 100.0 fL   MCH 30.2 26.0 - 34.0 pg   MCHC 32.1 30.0 - 36.0 g/dL   RDW 13.7 11.5 - 15.5 %   Platelets 374 150 - 400 K/uL   nRBC 0.0 0.0 - 0.2 %   Neutrophils Relative % 79 %   Neutro Abs 15.6 (H) 1.7 - 7.7 K/uL   Lymphocytes Relative 10 %   Lymphs Abs 1.9 0.7 - 4.0 K/uL   Monocytes Relative 9 %   Monocytes Absolute 1.9 (H) 0.1 - 1.0 K/uL   Eosinophils Relative 1 %   Eosinophils Absolute 0.2 0.0 - 0.5 K/uL   Basophils Relative 0 %   Basophils Absolute 0.1 0.0 - 0.1 K/uL   Immature Granulocytes 1 %   Abs Immature Granulocytes 0.26 (H) 0.00 - 0.07 K/uL    Comment: Performed at [facility], [address], [place], [place] [address]  APTT     Status: Abnormal   Collection Time: 02/03/19 11:33 AM  Result Value Ref Range   aPTT 38 (H) 24 - 36 seconds    Comment:        IF BASELINE aPTT IS ELEVATED, SUGGEST PATIENT RISK ASSESSMENT BE USED TO DETERMINE APPROPRIATE ANTICOAGULANT THERAPY. Performed at [facility], [place] [address], [place], [place] [address]   Protime-INR     Status: Abnormal   Collection Time: 02/03/19 11:33 AM  Result Value Ref Range   Prothrombin Time 16.1 (H) 11.4 - 15.2 seconds   INR 1.3 (H) 0.8 -  1.2    Comment: (NOTE) INR goal varies based on device and  disease states. Performed at [facility], [place] [address] [place], [place] [address]   Lactic acid, plasma     Status: None   Collection Time: 02/03/19 11:36 AM  Result Value Ref Range   Lactic Acid, Venous 1.3 0.5 - 1.9 mmol/L    Comment: Performed at [facility] [address], [place], [place] [address]  Urinalysis, Routine w reflex microscopic     Status: Abnormal   Collection Time: 02/03/19  1:00 PM  Result Value Ref Range   Color, Urine STRAW (A) YELLOW   APPearance CLEAR CLEAR   Specific Gravity, Urine 1.016 1.005 - 1.030   pH 5.0 5.0 - 8.0   Glucose, UA NEGATIVE NEGATIVE mg/dL   Hgb urine dipstick SMALL (A) NEGATIVE   Bilirubin Urine NEGATIVE NEGATIVE   Ketones, ur NEGATIVE NEGATIVE mg/dL   Protein, ur NEGATIVE NEGATIVE mg/dL   Nitrite NEGATIVE NEGATIVE   Leukocytes,Ua NEGATIVE NEGATIVE   RBC / HPF 0-5 0 - 5 RBC/hpf   WBC, UA 0-5 0 - 5 WBC/hpf   Bacteria, UA NONE SEEN NONE SEEN   Squamous Epithelial / LPF 0-5 0 - 5    Comment: Performed at [place] [address], [place], [place] [address]    No results found.  Review of Systems  Unable to perform ROS: Mental status change   Blood pressure (!) 124/55, pulse 63, temperature 98.6 F (37 C), temperature source Oral, resp. rate (!) 21, height 5' 1.5" (1.562 m), weight 63.5 kg, SpO2 95 %. Physical Exam  Constitutional: She appears well-developed and well-nourished.  HENT:  Head: Normocephalic and atraumatic.  Eyes: Pupils are equal, round, and reactive to light. Conjunctivae and EOM are normal.  Neck: Normal range of motion. Neck supple. Erythema present.  Cardiovascular: Regular rhythm. Bradycardia present.  Respiratory: Effort normal. No respiratory distress.  GI: Soft. She exhibits no distension.  Musculoskeletal: Normal range of motion.     Comments: Strength and sensation grossly intact  Neurological: She is alert. She has  normal strength. No cranial nerve deficit or sensory deficit. GCS eye subscore is 4. GCS verbal subscore is 4. GCS motor subscore is 6.  Skin:      Associated Documents Scan on 02/03/2019 3:22 PM by Viona Gilmore D, NP      Assessment/Plan: Patient is three weeks status post lumbar fusion. She developed swelling and tenderness on the left side of her face and neck two days ago. The fluid collection near her lumbar surgical site is likely a seroma, given the proximity of her procedure date. Her symptoms are likely related to her parotid otitis. Recommend continued antibiotic therapy per pharmacy and ENT. No neurosurgical interventions recommended at this time.  Patricia Nettle 02/03/2019, 3:52 PM

## 2019-02-04 LAB — CBC
HCT: 24.7 % — ABNORMAL LOW (ref 36.0–46.0)
Hemoglobin: 8.3 g/dL — ABNORMAL LOW (ref 12.0–15.0)
MCH: 31.2 pg (ref 26.0–34.0)
MCHC: 33.6 g/dL (ref 30.0–36.0)
MCV: 92.9 fL (ref 80.0–100.0)
Platelets: 337 10*3/uL (ref 150–400)
RBC: 2.66 MIL/uL — ABNORMAL LOW (ref 3.87–5.11)
RDW: 13.9 % (ref 11.5–15.5)
WBC: 14.7 10*3/uL — ABNORMAL HIGH (ref 4.0–10.5)
nRBC: 0 % (ref 0.0–0.2)

## 2019-02-04 LAB — BASIC METABOLIC PANEL
Anion gap: 11 (ref 5–15)
BUN: 10 mg/dL (ref 8–23)
CO2: 20 mmol/L — ABNORMAL LOW (ref 22–32)
Calcium: 8.1 mg/dL — ABNORMAL LOW (ref 8.9–10.3)
Chloride: 113 mmol/L — ABNORMAL HIGH (ref 98–111)
Creatinine, Ser: 1.17 mg/dL — ABNORMAL HIGH (ref 0.44–1.00)
GFR calc Af Amer: 54 mL/min — ABNORMAL LOW (ref >60)
GFR calc non Af Amer: 47 mL/min — ABNORMAL LOW (ref >60)
Glucose, Bld: 101 mg/dL — ABNORMAL HIGH (ref 70–99)
Potassium: 4.1 mmol/L (ref 3.5–5.1)
Sodium: 144 mmol/L (ref 135–145)

## 2019-02-04 LAB — RETICULOCYTES
Immature Retic Fract: 13.8 % (ref 2.3–15.9)
RBC.: 2.66 MIL/uL — ABNORMAL LOW (ref 3.87–5.11)
Retic Count, Absolute: 33.8 10*3/uL (ref 19.0–186.0)
Retic Ct Pct: 1.3 % (ref 0.4–3.1)

## 2019-02-04 LAB — GLUCOSE, CAPILLARY
Glucose-Capillary: 94 mg/dL (ref 70–99)
Glucose-Capillary: 105 mg/dL — ABNORMAL HIGH (ref 70–99)
Glucose-Capillary: 83 mg/dL (ref 70–99)
Glucose-Capillary: 86 mg/dL (ref 70–99)

## 2019-02-04 LAB — FERRITIN: Ferritin: 150 ng/mL (ref 11–307)

## 2019-02-04 LAB — IRON AND TIBC
Iron: 11 ug/dL — ABNORMAL LOW (ref 28–170)
Saturation Ratios: 6 % — ABNORMAL LOW (ref 10.4–31.8)
TIBC: 193 ug/dL — ABNORMAL LOW (ref 250–450)
UIBC: 182 ug/dL

## 2019-02-04 LAB — HEMOGLOBIN A1C
Hgb A1c MFr Bld: 6 % — ABNORMAL HIGH (ref 4.8–5.6)
Mean Plasma Glucose: 125.5 mg/dL

## 2019-02-04 LAB — URINE CULTURE: Culture: NO GROWTH

## 2019-02-04 LAB — VITAMIN B12: Vitamin B-12: 778 pg/mL (ref 180–914)

## 2019-02-04 NOTE — Progress Notes (Signed)
PROGRESS NOTE  Heather Espinoza  DOB: Apr 19, 1945  PCP: London Pepper, MD [id number]  DOA: 02/03/2019  LOS: 1 day   No chief complaint on file.  Brief narrative: Heather Espinoza is a 73 y.o. F with hx of COPD with alpha-1 antitrypsin deficiency, not on oxygen, DM, HTN, hypothyroidism, CKD 3, baseline creatinine 1.2, OSA not on CPAP, and recent L4-5 decompression and fusion who presents with face swelling and confusion.  The patient was in her usual state of health (walking without a walker, no dementia, lives at home with her husband, had recuperated well from her recent lumbar fusion) until Sunday, she woke with new left-sided pain in the face and swelling.  She was seen in the ER, prescribed Augmentin and discharged home.  Woke Monday very confused, progressively more confused over the course of the day, so called EMS in the evening.  [place] of the head showed left-sided parotitis, no fluid collection, surrounding cellulitis.  She was given vancomycin, Zosyn, and IV fluids. Family requested transfer to [facility].  Subjective: Patient was seen and examined this morning.  Pleasant elderly Caucasian female.  Propped up in bed.  Not in distress.  Still having significant pain in the left parotid gland area.  Assessment/Plan: Parotitis  Facial cellulitis -Not septic.  No evidence of fluid collection imaging.   -WBC count was elevated to 20,000.  Started on empiric antibiotic with IV vancomycin and IV Zosyn.  -WBC count improving.  However, patient is still has significant swelling and pain.   -We will continue to bring antibiotics for now.  Patient has been started on soft diet.  Advance as tolerated.  Trend CBC and temperature.    Diabetes -Hold home metformin -Start sliding scale correction insulin -Continue simvastatin  Hypertension -Blood pressure normal here.  Lisinopril and amlodipine remain on hold.  Continue propanolol.  CKD IIIb  Stable relative to baseline  Recent lumbar surgery There is a seroma on MRI L-spine that was obtained at the outside facility.  Neurosurgery were contacted as a courtesy, and appreciate their recommendations.  This is not related to her current presentation.  COPD Chart history of alpha-1 antitrypsin deficiency, no active bronchospasm on exam.  Does not use oxygen at baseline.  Anemia This appears to be a chronic normocytic anemia, with a superimposed acute blood loss after surgery. -Check ferritin and iron studies -Check reticulocytes -Check B12  Hypothyroidism  Sleep apnea Does not use CPAP  Fibromyalgia -Continue Wellbutrin, Cymbalta, mirtazapine -QT interval normal.  Mobility: Encourage ambulation DVT prophylaxis:  Lovenox Code Status:   Code Status: Full Code  Family Communication:  None at bedside Expected Discharge:  Expect discharge to home in 1 to 2 days  Consultants:  Neurosurgery  Procedures:  None  Antimicrobials: Anti-infectives (From admission, onward)   Start     Dose/Rate Route Frequency Ordered Stop   02/04/19 0700  vancomycin (VANCOCIN) IVPB 1000 mg/200 mL premix     1,000 mg 200 mL/hr over 60 Minutes Intravenous Every 36 hours 02/03/19 1250     11 /17/20 1400  piperacillin-tazobactam (ZOSYN) IVPB 3.375 g  Status:  Discontinued     3.375 g 12.5 mL/hr over 240 Minutes Intravenous Every 8 hours 02/03/19 1250 02/03/19 1254   02/03/19 1300  piperacillin-tazobactam (ZOSYN) IVPB 3.375 g     3.375 g 12.5 mL/hr over 240 Minutes Intravenous Every 8 hours 02/03/19 1254        Diet Order  DIET SOFT Room service appropriate? Yes; Fluid consistency: Thin  Diet effective now              Infusions:  . piperacillin-tazobactam (ZOSYN)  IV 3.375 g (02/04/19 [id number])  . vancomycin 1,000 mg (02/04/19 [id number])    Scheduled Meds: . aspirin  81 mg Oral Daily  . buPROPion  150 mg Oral Daily  . DULoxetine  60 mg Oral BID  . enoxaparin  (LOVENOX) injection  40 mg Subcutaneous Q24H  . gabapentin  400 mg Oral BID  . insulin aspart  0-15 Units Subcutaneous TID WC  . insulin aspart  0-5 Units Subcutaneous QHS  . levothyroxine  37.5 mcg Oral QAC breakfast  . mirtazapine  30 mg Oral QHS  . mometasone-formoterol  2 puff Inhalation BID  . propranolol  20 mg Oral BID  . simvastatin  20 mg Oral QPM    PRN meds: acetaminophen **OR** acetaminophen, albuterol, ondansetron **OR** ondansetron (ZOFRAN) IV, oxyCODONE   Objective: Vitals:   02/04/19 0547 02/04/19 1242  BP: 130/63 (!) 99/52  Pulse: 67 (!) 54  Resp: 18 16  Temp: 98.3 F (36.8 C) 97.6 F (36.4 C)  SpO2: 96% 93%    Intake/Output Summary (Last 24 hours) at 02/04/2019 1601 Last data filed at 02/04/2019 [id number] Gross per 24 hour  Intake 1100 ml  Output -  Net 1100 ml   Filed Weights   02/03/19 1057  Weight: 63.5 kg   Weight change:  Body mass index is 26.02 kg/m.   Physical Exam: General exam: Appears calm and comfortable.  Skin: No rashes, lesions or ulcers. HEENT: Left parotid gland area swollen, tender Lungs: Clear to auscultation bilaterally CVS: Regular rate and rhythm, no murmur GI/Abd soft, nontender, nondistended, bowel sound present CNS: Alert, awake, oriented x3 Psychiatry: Mood appropriate Extremities: No pedal edema, no calf tenderness  Data Review: I have personally reviewed the laboratory data and studies available.  Recent Labs  Lab 02/03/19 1133 02/04/19 0625  WBC 19.9* 14.7*  NEUTROABS 15.6*  --   HGB 8.6* 8.3*  HCT 26.8* 24.7*  MCV 94.0 92.9  PLT 374 337   Recent Labs  Lab 02/03/19 1133 02/04/19 0418  NA 139 144  K 3.7 4.1  CL 108 113*  CO2 22 20*  GLUCOSE 122* 101*  BUN 11 10  CREATININE 1.29* 1.17*  CALCIUM 8.1* 8.1*    Terrilee Croak, MD  [facility] 02/04/2019

## 2019-02-04 NOTE — Progress Notes (Signed)
Subjective: The patient is alert and pleasant.  Objective: Vital signs in last 24 hours: Temp:  [98.3 F (36.8 C)-98.7 F (37.1 C)] 98.3 F (36.8 C) (11/18 0547) Pulse Rate:  [58-74] 67 (11/18 0547) Resp:  [14-21] 18 (11/18 0547) BP: (100-138)/(47-79) 130/63 (11/18 0547) SpO2:  [93 %-100 %] 96 % (11/18 0547) Weight:  [63.5 kg] 63.5 kg (11/17 1057) Estimated body mass index is 26.02 kg/m as calculated from the following:   Height as of this encounter: 5' 1.5" (1.562 m).   Weight as of this encounter: 63.5 kg.   Intake/Output from previous day: 11/17 0701 - 11/18 0700 In: 1100 [I.V.:800; IV Piggyback:300] Out: -  Intake/Output this shift: No intake/output data recorded.  Physical exam the patient is alert and oriented.  Her lower extremity strength is normal.  Wound is healing well.  Lab Results: Recent Labs    02/03/19 1133 02/04/19 0625  WBC 19.9* 14.7*  HGB 8.6* 8.3*  HCT 26.8* 24.7*  PLT 374 337   BMET Recent Labs    02/03/19 1133 02/04/19 0418  NA 139 144  K 3.7 4.1  CL 108 113*  CO2 22 20*  GLUCOSE 122* 101*  BUN 11 10  CREATININE 1.29* 1.17*  CALCIUM 8.1* 8.1*    Studies/Results: No results found.  Assessment/Plan: Status post lumbar fusion: The patient's wound looks fine.  She is recovering well except as below.  Parotiditis and cellulitis: The patient is on antibiotics.  I appreciate the hospitalist care of this patient.  LOS: 1 day     Ophelia Charter 02/04/2019, 9:14 AM

## 2019-02-05 LAB — CBC WITH DIFFERENTIAL/PLATELET
Abs Immature Granulocytes: 0.07 10*3/uL (ref 0.00–0.07)
Basophils Absolute: 0.1 10*3/uL (ref 0.0–0.1)
Basophils Relative: 1 %
Eosinophils Absolute: 1 10*3/uL — ABNORMAL HIGH (ref 0.0–0.5)
Eosinophils Relative: 10 %
HCT: 27.1 % — ABNORMAL LOW (ref 36.0–46.0)
Hemoglobin: 8.6 g/dL — ABNORMAL LOW (ref 12.0–15.0)
Immature Granulocytes: 1 %
Lymphocytes Relative: 19 %
Lymphs Abs: 2 10*3/uL (ref 0.7–4.0)
MCH: 29.7 pg (ref 26.0–34.0)
MCHC: 31.7 g/dL (ref 30.0–36.0)
MCV: 93.4 fL (ref 80.0–100.0)
Monocytes Absolute: 1.3 10*3/uL — ABNORMAL HIGH (ref 0.1–1.0)
Monocytes Relative: 12 %
Neutro Abs: 6 10*3/uL (ref 1.7–7.7)
Neutrophils Relative %: 57 %
Platelets: 374 10*3/uL (ref 150–400)
RBC: 2.9 MIL/uL — ABNORMAL LOW (ref 3.87–5.11)
RDW: 13.7 % (ref 11.5–15.5)
WBC: 10.4 10*3/uL (ref 4.0–10.5)
nRBC: 0 % (ref 0.0–0.2)

## 2019-02-05 LAB — BASIC METABOLIC PANEL
Anion gap: 8 (ref 5–15)
BUN: 8 mg/dL (ref 8–23)
CO2: 22 mmol/L (ref 22–32)
Calcium: 8.3 mg/dL — ABNORMAL LOW (ref 8.9–10.3)
Chloride: 110 mmol/L (ref 98–111)
Creatinine, Ser: 1.36 mg/dL — ABNORMAL HIGH (ref 0.44–1.00)
GFR calc Af Amer: 45 mL/min — ABNORMAL LOW (ref >60)
GFR calc non Af Amer: 39 mL/min — ABNORMAL LOW (ref >60)
Glucose, Bld: 117 mg/dL — ABNORMAL HIGH (ref 70–99)
Potassium: 3.3 mmol/L — ABNORMAL LOW (ref 3.5–5.1)
Sodium: 140 mmol/L (ref 135–145)

## 2019-02-05 LAB — GLUCOSE, CAPILLARY
Glucose-Capillary: 88 mg/dL (ref 70–99)
Glucose-Capillary: 94 mg/dL (ref 70–99)
Glucose-Capillary: 103 mg/dL — ABNORMAL HIGH (ref 70–99)
Glucose-Capillary: 97 mg/dL (ref 70–99)

## 2019-02-05 MED ORDER — VANCOMYCIN HCL IN DEXTROSE 750-5 MG/150ML-% IV SOLN
750.0000 mg | INTRAVENOUS | Status: DC
  Administered 2019-02-05: 750 mg via INTRAVENOUS
  Filled 2019-02-05: qty 150

## 2019-02-05 NOTE — Progress Notes (Signed)
Paged TR[facility] to advise propanolol held d/t HR 54

## 2019-02-05 NOTE — Progress Notes (Signed)
PROGRESS NOTE  Heather Espinoza  DOB: October 22, 1945  PCP: London Pepper, MD [id number]  DOA: 02/03/2019  LOS: 2 days   Chief complaint: Face swelling.  Brief narrative: Heather Espinoza is a 73 y.o. F with hx of COPD with alpha-1 antitrypsin deficiency, not on oxygen, DM, HTN, hypothyroidism, CKD 3, baseline creatinine 1.2, OSA not on CPAP, and recent L4-5 decompression and fusion. Patient presented to the ED on 11/17 with face swelling and confusion.  The patient was in her usual state of health (walking without a walker, no dementia, lives at home with her husband, had recuperated well from her recent lumbar fusion) until Sunday, she woke with new left-sided pain in the face and swelling.  She was seen in the ER, prescribed Augmentin and discharged home.  Woke Monday very confused, progressively more confused over the course of the day, so called EMS in the evening.  [place] of the head showed left-sided parotitis, no fluid collection, surrounding cellulitis.  She was given vancomycin, Zosyn, and IV fluids. Family requested transfer to [facility].  Subjective: Patient was seen and examined this morning. Lying on bed. Not in distress. Left parotid gland swelling improving. Feels much better No fever, blood pressure stable. Daughter at bedside. Labs from this morning reviewed. WBC count improved to normal at 10.4.  Assessment/Plan: Left parotitis  Facial cellulitis -Not septic.  No evidence of fluid collection imaging.   -WBC count was elevated to 20,000.  Started on empiric antibiotic with IV vancomycin and IV Zosyn. WBC count improving, 10,000 today. Swelling and pain improving. Continue Zosyn. Stop vancomycin today. Currently on soft diet. Continue same. Trend WBC and temperature.    Diabetes -Continue to hold home metformin -Continue sliding scale insulin with Accu-Cheks. -Continue simvastatin  Hypertension -Blood pressure normal here.  Lisinopril and amlodipine remain on hold. Continue propanolol.  CKD IIIb -Stable relative to baseline  Recent lumbar surgery -There is a seroma on MRI L-spine that was obtained at an outside facility. -Neurosurgery evaluated the patient. No need surgical intervention at this time.  COPD Chart history of alpha-1 antitrypsin deficiency, no active bronchospasm on exam.  Does not use oxygen at baseline.  Anemia This appears to be a chronic normocytic anemia, with a superimposed acute blood loss after surgery. -Ferritin 150 -Vitamin B12 level 778.  Hypothyroidism -Continue Synthroid.  Obstructive sleep apnea Does not use CPAP  Fibromyalgia -Continue Wellbutrin, Cymbalta, mirtazapine -QT interval normal.  Mobility: Encourage ambulation DVT prophylaxis:  Lovenox Code Status:   Code Status: Full Code  Family Communication:  None at bedside Expected Discharge:  Pending PT evaluation. Anticipate discharge to home tomorrow  Consultants:  Neurosurgery  Procedures:  None  Antimicrobials: Anti-infectives (From admission, onward)   Start     Dose/Rate Route Frequency Ordered Stop   02/04/19 0700  vancomycin (VANCOCIN) IVPB 1000 mg/200 mL premix     1,000 mg 200 mL/hr over 60 Minutes Intravenous Every 36 hours 02/03/19 1250     11 /17/20 1400  piperacillin-tazobactam (ZOSYN) IVPB 3.375 g  Status:  Discontinued     3.375 g 12.5 mL/hr over 240 Minutes Intravenous Every 8 hours 02/03/19 1250 02/03/19 1254   02/03/19 1300  piperacillin-tazobactam (ZOSYN) IVPB 3.375 g     3.375 g 12.5 mL/hr over 240 Minutes Intravenous Every 8 hours 02/03/19 1254        Diet Order            DIET SOFT Room  service appropriate? Yes; Fluid consistency: Thin  Diet effective now              Infusions:  . piperacillin-tazobactam (ZOSYN)  IV 3.375 g (02/05/19 0503)  . vancomycin 1,000 mg (02/04/19 [id number])    Scheduled Meds: . aspirin  81 mg Oral Daily  . buPROPion  150 mg Oral Daily   . DULoxetine  60 mg Oral BID  . enoxaparin (LOVENOX) injection  40 mg Subcutaneous Q24H  . gabapentin  400 mg Oral BID  . insulin aspart  0-15 Units Subcutaneous TID WC  . insulin aspart  0-5 Units Subcutaneous QHS  . levothyroxine  37.5 mcg Oral QAC breakfast  . mirtazapine  30 mg Oral QHS  . mometasone-formoterol  2 puff Inhalation BID  . propranolol  20 mg Oral BID  . simvastatin  20 mg Oral QPM    PRN meds: acetaminophen **OR** acetaminophen, albuterol, ondansetron **OR** ondansetron (ZOFRAN) IV, oxyCODONE   Objective: Vitals:   02/05/19 0556 02/05/19 0838  BP: 132/61   Pulse: (!) 57   Resp: 18   Temp: 97.6 F (36.4 C)   SpO2: 98% 99%    Intake/Output Summary (Last 24 hours) at 02/05/2019 0857 Last data filed at 02/04/2019 2255 Gross per 24 hour  Intake 50 ml  Output -  Net 50 ml   Filed Weights   02/03/19 1057  Weight: 63.5 kg   Weight change:  Body mass index is 26.02 kg/m.   Physical Exam: General exam: Appears calm and comfortable.  Skin: No rashes, lesions or ulcers. HEENT: Left parotid gland area swelling and tenderness improving. Lungs: Clear to auscultation bilaterally CVS: Regular rate and rhythm, no murmur GI/Abd soft, nontender, nondistended, bowel sound present CNS: Alert, awake, oriented x3 Psychiatry: Mood appropriate Extremities: No pedal edema, no calf tenderness  Data Review: I have personally reviewed the laboratory data and studies available.  Recent Labs  Lab 02/03/19 1133 02/04/19 0625  WBC 19.9* 14.7*  NEUTROABS 15.6*  --   HGB 8.6* 8.3*  HCT 26.8* 24.7*  MCV 94.0 92.9  PLT 374 337   Recent Labs  Lab 02/03/19 1133 02/04/19 0418  NA 139 144  K 3.7 4.1  CL 108 113*  CO2 22 20*  GLUCOSE 122* 101*  BUN 11 10  CREATININE 1.29* 1.17*  CALCIUM 8.1* 8.1*    Terrilee Croak, MD  [facility] 02/05/2019

## 2019-02-05 NOTE — Progress Notes (Signed)
Pharmacy Antibiotic Note  Heather Espinoza is a 73 y.o. female admitted on 02/03/2019 with cellulitis.  Pharmacy has been consulted for Vanco/Zosyn dosing.  ID: Parotiditis with facial cellulitis. s/p lumbar decompression 2wks ago - possible post-op infection. Wound looks ok to neurosurgery. - Afebrile. WBC 19.9>14.7  Vanc 11/16>> Zosyn 11/16>>  11/17 BCx: NGTD 11/17 UCx: NGTD Outside hospital Cx unavailable  11/17: COVID negative  - 11/19: Vancomycin 750 mg IV Q 24 hrs. Goal AUC 400-550. Expected AUC: 512.3 SCr used: 1.17   Plan: Increase Vanco to 750mg  IV q 24h. Zosyn 3.375g IV every 8 hours (extended infusion) F/u cultures Many home meds not resumed: Norvasc, colace, PPI, famotidine, Vit D, B12, lisinopril, metformin, Trazodone, Vit E    Height: 5' 1.5" (156.2 cm) Weight: 140 lb (63.5 kg) IBW/kg (Calculated) : 48.95  Temp (24hrs), Avg:97.9 F (36.6 C), Min:97.6 F (36.4 C), Max:98.4 F (36.9 C)  Recent Labs  Lab 02/03/19 1133 02/03/19 1136 02/04/19 0418 02/04/19 0625  WBC 19.9*  --   --  14.7*  CREATININE 1.29*  --  1.17*  --   LATICACIDVEN  --  1.3  --   --     Estimated Creatinine Clearance: 38.2 mL/min (A) (by C-G formula based on SCr of 1.17 mg/dL (H)).    Allergies  Allergen Reactions  . Flagyl [Metronidazole] Shortness Of Breath and Swelling  . Lyrica [Pregabalin] Shortness Of Breath  . Adhesive [Tape] Other (See Comments)    Takes off patient's skin  . Aspirin Other (See Comments)    Stomach bleed  . Latex Itching and Rash  . Phenergan [Promethazine Hcl] Other (See Comments)    Caused grogginess, altered mental status  . Prednisone Anxiety  . Sulfa Antibiotics Nausea Only  . Tetanus Toxoids Swelling    Arm Area     Katherleen Folkes S. Alford Highland, PharmD, [place] Clinical Staff Pharmacist [place], [place] 02/05/2019 9:50 AM

## 2019-02-05 NOTE — Discharge Instructions (Signed)
[place]

## 2019-02-05 NOTE — Progress Notes (Signed)
Subjective: The patient is alert and pleasant.  Her daughter is at the bedside.  Objective: Vital signs in last 24 hours: Temp:  [97.6 F (36.4 C)-98.4 F (36.9 C)] 97.6 F (36.4 C) (11/19 0556) Pulse Rate:  [54-62] 57 (11/19 0556) Resp:  [16-18] 18 (11/19 0556) BP: (99-132)/(52-63) 132/61 (11/19 0556) SpO2:  [93 %-100 %] 99 % (11/19 0838) Estimated body mass index is 26.02 kg/m as calculated from the following:   Height as of this encounter: 5' 1.5" (1.562 m).   Weight as of this encounter: 63.5 kg.   Intake/Output from previous day: 11/18 0701 - 11/19 0700 In: 50 [IV Piggyback:50] Out: -  Intake/Output this shift: No intake/output data recorded.  Physical exam the patient is alert and oriented.  She is moving her lower extremities well.  Lab Results: Recent Labs    02/04/19 0625 02/05/19 1008  WBC 14.7* 10.4  HGB 8.3* 8.6*  HCT 24.7* 27.1*  PLT 337 374   BMET Recent Labs    02/04/19 0418 02/05/19 1008  NA 144 140  K 4.1 3.3*  CL 113* 110  CO2 20* 22  GLUCOSE 101* 117*  BUN 10 8  CREATININE 1.17* 1.36*  CALCIUM 8.1* 8.3*    Studies/Results: No results found.  Assessment/Plan: Parotiditis, cellulitis: I appreciate Dr. Judie Bonus excellent care of Mrs. Raden.  She is improving.  I have instructed the patient to call my office for a follow-up appointment in a few weeks.  Please call if I can help.  LOS: 2 days     Ophelia Charter 02/05/2019, 11:36 AM

## 2019-02-05 NOTE — TOC Initial Note (Signed)
Transition of Care [facility]) - Initial/Assessment Note    Patient Details  Name: Heather Espinoza MRN: [id number] Date of Birth: Sep 14, 1945  [facility]) CM/SW Contact:    [name], RN Phone Number: 02/05/2019, 3:43 PM  Clinical Narrative:                 Spoke to patient and daughter at bedside. Patient from home has walker and 3 in 1 . Patient active with [facility] for HHPT,OT,RN and would like to continue . Received orders called [name] at  [name] aware potential discharge for 02/06/19, orders faxed [phone].  Expected Discharge Plan: [place] Barriers to Discharge: Continued Medical Work up   Patient Goals and CMS Choice Patient states their goals for this hospitalization and ongoing recovery are:: to get stronger CMS Medicare.gov Compare Post Acute Care list provided to:: Patient Choice offered to / list presented to : Patient, Adult Children  Expected Discharge Plan and Services Expected Discharge Plan: [place]   Discharge Planning Services: CM Consult Post Acute Care Choice: [place] arrangements for the past 2 months: Single Family Home                 DME Arranged: N/A         HH Arranged: PT, OT, RN       Representative spoke with at [place]: [place] spoke with [name]  Prior Living Arrangements/Services Living arrangements for the past 2 months: Single Family Home Lives with:: Spouse Patient language and need for interpreter reviewed:: Yes Do you feel safe going back to the place where you live?: Yes      Need for Family Participation in Patient Care: Yes (Comment) Care giver support system in place?: Yes (comment) Current home services: DME Criminal Activity/Legal Involvement Pertinent to Current Situation/Hospitalization: No - Comment as needed  Activities of Daily Living      Permission Sought/Granted   Permission granted to share  information with : No              Emotional Assessment Appearance:: Appears stated age Attitude/Demeanor/Rapport: Engaged Affect (typically observed): Accepting Orientation: : Oriented to Self, Oriented to Place, Oriented to  Time, Oriented to Situation Alcohol / Substance Use: Not Applicable Psych Involvement: No (comment)  Admission diagnosis:  Acute parotitis [K11.21] Altered mental status, unspecified altered mental status type [R41.82] Patient Active Problem List   Diagnosis Date Noted  . COPD (chronic obstructive pulmonary disease) ([place]) 02/03/2019  . CKD (chronic kidney disease), stage III 02/03/2019  . Acute blood loss anemia 02/03/2019  . Parotitis 02/03/2019  . Cellulitis of face 02/03/2019  . Spondylolisthesis, lumbar region 01/12/2019  . Tachycardia 01/07/2019  . Syncope and collapse 01/07/2019  . Palpitations 07/01/2018  . SOB (shortness of breath) 07/01/2018  . Educated about COVID-19 virus infection 07/01/2018  . Lobar pneumonia ([place]) 04/10/2018  . Streptococcus pneumoniae pneumonia ([place]) 04/10/2018  . Prolonged QT interval 04/10/2018  . Tremor, essential 01/07/2018  . Other dysphagia 12/21/2016  . Primary osteoarthritis of first carpometacarpal joint of left hand 12/11/2016  . Status post wrist surgery 12/11/2016  . Chronic low back pain 10/07/2015  . IBS (irritable bowel syndrome) 02/17/2013  . GERD (gastroesophageal reflux disease) 02/17/2013  . Depression 02/17/2013  . Family hx of colon cancer 11/24/2012  . Essential hypertension, benign 11/24/2012  . High cholesterol 11/24/2012  . Diabetes (  [place]) 11/24/2012  . Fibromyalgia 11/24/2012  . Acquired hypothyroidism 11/24/2012   PCP:  London Pepper, MD Pharmacy:   [facility] [address], [place] - [address] [address] [place] [address] [place] [address] Phone: [phone] Fax: [phone]  [facility] [place], [place] [id number] [place]  [id number] Phone: [phone] Fax: [phone]     Social Determinants of Health (SDOH) Interventions    Readmission Risk Interventions No flowsheet data found.

## 2019-02-05 NOTE — Evaluation (Signed)
Physical Therapy Evaluation Patient Details Name: Heather Espinoza MRN: [id number] DOB: 30-Sep-1945 Today's Date: 02/05/2019   History of Present Illness  73yo female s/p L4-L5 decompression done 01/12/19, now with new facial swelling and confusion. [place], CT positive for parotitis and cellulitis, then transferred to [facility] per family request. PMH COPD, DM, fibromyalgia, HTN, HLD, IBS, tachycardia, R elbow surgery  Clinical Impression   Patient received in bed, willing to participate in PT session; daughter present and observed entirety of session. Required cues to maintain back precautions but able to perform bed mobility with min guard, functional transfers with RW/min guard, and gait approximately 161f with min guard and RW. She was left up in the chair with all needs met, chair alarm active and family present. Currently recommending skilled HHPT services moving forward.     Follow Up Recommendations Home health PT    Equipment Recommendations  Rolling walker with 5" wheels;3in1 (PT)    Recommendations for Other Services       Precautions / Restrictions Precautions Precautions: Fall;Back Precaution Comments: Pt able to recall 1/3 precautions. Reviewed 3/3 Restrictions Weight Bearing Restrictions: No      Mobility  Bed Mobility   Bed Mobility: Rolling;Sidelying to Sit Rolling: Min guard Sidelying to sit: Min guard;HOB elevated       General bed mobility comments: VC to continue back precatutions, attempted to get to EOB without rolling, had to be stopped by therapist  Transfers Overall transfer level: Needs assistance Equipment used: Rolling walker (2 wheeled) Transfers: Sit to/from Stand Sit to Stand: Min guard         General transfer comment: min guard, cues for hand placement; MinA for balance without B UE support on RW, min guard with B UE support  Ambulation/Gait Ambulation/Gait assistance: Min guard Gait Distance (Feet): 150  Feet Assistive device: Rolling walker (2 wheeled) Gait Pattern/deviations: Step-through pattern;Decreased step length - right;Decreased step length - left;Decreased stride length;Trunk flexed Gait velocity: decreased   General Gait Details: min guard for safety, no physical assist given  Stairs            Wheelchair Mobility    Modified Rankin (Stroke Patients Only)       Balance Overall balance assessment: Needs assistance Sitting-balance support: Bilateral upper extremity supported;Feet supported Sitting balance-Leahy Scale: Normal     Standing balance support: During functional activity;Bilateral upper extremity supported Standing balance-Leahy Scale: Fair Standing balance comment: Required UE support on RW.                              Pertinent Vitals/Pain Pain Assessment: No/denies pain Pain Score: 0-No pain Pain Intervention(s): Limited activity within patient's tolerance;Monitored during session    HGanttexpects to be discharged to:: Private residence Living Arrangements: Spouse/significant other Available Help at Discharge: Family;Available 24 hours/day Type of Home: House Home Access: Stairs to enter Entrance Stairs-Rails: Can reach both Entrance Stairs-Number of Steps: 8 Home Layout: One level Home Equipment: Walker - 2 wheels;Bedside commode      Prior Function Level of Independence: Independent         Comments: drives, grocery shops, most days she is at home per her report     Hand Dominance   Dominant Hand: Right    Extremity/Trunk Assessment   Upper Extremity Assessment Upper Extremity Assessment: Generalized weakness    Lower Extremity Assessment Lower Extremity Assessment: Generalized weakness  Cervical / Trunk Assessment Cervical / Trunk Assessment: Other exceptions Cervical / Trunk Exceptions: s/p back sx  Communication   Communication: No difficulties  Cognition Arousal/Alertness:  Awake/alert Behavior During Therapy: WFL for tasks assessed/performed Overall Cognitive Status: Within Functional Limits for tasks assessed                                 General Comments: occasional cues to maintain back precautions      General Comments      Exercises     Assessment/Plan    PT Assessment Patient needs continued PT services  PT Problem List Decreased strength;Obesity;Decreased knowledge of use of DME;Decreased activity tolerance;Decreased safety awareness;Decreased balance;Decreased knowledge of precautions;Pain;Decreased mobility;Decreased coordination       PT Treatment Interventions DME instruction;Balance training;Gait training;Neuromuscular re-education;Stair training;Functional mobility training;Patient/family education;Therapeutic activities;Therapeutic exercise    PT Goals (Current goals can be found in the Care Plan section)  Acute Rehab PT Goals Patient Stated Goal: feel better PT Goal Formulation: With patient Time For Goal Achievement: 02/19/19 Potential to Achieve Goals: Good    Frequency Min 3X/week   Barriers to discharge        Co-evaluation               AM-PAC PT "6 Clicks" Mobility  Outcome Measure Help needed turning from your back to your side while in a flat bed without using bedrails?: A Little Help needed moving from lying on your back to sitting on the side of a flat bed without using bedrails?: A Little Help needed moving to and from a bed to a chair (including a wheelchair)?: A Little Help needed standing up from a chair using your arms (e.g., wheelchair or bedside chair)?: A Little Help needed to walk in hospital room?: A Little Help needed climbing 3-5 steps with a railing? : A Little 6 Click Score: 18    End of Session   Activity Tolerance: Patient tolerated treatment well Patient left: in chair;with call bell/phone within reach;with chair alarm set;with family/visitor present Nurse Communication:  Mobility status PT Visit Diagnosis: Unsteadiness on feet (R26.81);Difficulty in walking, not elsewhere classified (R26.2);Muscle weakness (generalized) (M62.81)    Time: 1207-1226 PT Time Calculation (min) (ACUTE ONLY): 19 min   Charges:   PT Evaluation $PT Eval Moderate Complexity: 1 Mod          Windell Norfolk, DPT, PN1   Supplemental Physical Therapist [place]    Pager [phone] Acute Rehab Office [phone]

## 2019-02-06 LAB — GLUCOSE, CAPILLARY
Glucose-Capillary: 94 mg/dL (ref 70–99)
Glucose-Capillary: 118 mg/dL — ABNORMAL HIGH (ref 70–99)

## 2019-02-06 MED ORDER — AMLODIPINE BESYLATE 5 MG PO TABS
5.0000 mg | ORAL_TABLET | Freq: Every day | ORAL | Status: DC
  Administered 2019-02-06: 5 mg via ORAL
  Filled 2019-02-06: qty 1

## 2019-02-06 MED ORDER — OXYCODONE HCL 5 MG PO TABS: 5.0000 mg | ORAL_TABLET | Freq: Three times a day (TID) | ORAL | 0 refills | Status: AC | PRN

## 2019-02-06 MED ORDER — SACCHAROMYCES BOULARDII 250 MG PO CAPS: 250.0000 mg | ORAL_CAPSULE | Freq: Two times a day (BID) | ORAL | 0 refills | Status: AC

## 2019-02-06 MED ORDER — AMOXICILLIN-POT CLAVULANATE 875-125 MG PO TABS: 1.0000 | ORAL_TABLET | Freq: Two times a day (BID) | ORAL | 0 refills | Status: AC

## 2019-02-06 MED ORDER — LISINOPRIL 10 MG PO TABS
10.0000 mg | ORAL_TABLET | Freq: Every day | ORAL | Status: DC
  Administered 2019-02-06: 10 mg via ORAL
  Filled 2019-02-06: qty 1

## 2019-02-06 NOTE — Care Management Important Message (Signed)
Important Message  Patient Details  Name: Heather Espinoza MRN: [id number] Date of Birth: 1946-01-29   Medicare Important Message Given:  Yes     Orbie Pyo 02/06/2019, 2:07 PM

## 2019-02-06 NOTE — Progress Notes (Signed)
RN gave pt discharge instructions and her and her daughter stated understanding. IVs have been removed,pt belongings have been packed and [facility] at bedside to [employer]

## 2019-02-06 NOTE — Discharge Summary (Addendum)
Physician Discharge Summary  MARIJO BARTEN [id number] DOB: 03-Dec-1945 DOA: 02/03/2019  PCP: London Pepper, MD  Admit date: 02/03/2019 Discharge date: 02/06/2019  Admitted From: Home Discharge disposition: Home   Code Status: Full Code  Diet Recommendation: Cardiac/diabetic diet   Recommendations for Outpatient Follow-Up:   1. Follow-up with PCP as an outpatient 2. Follow-up with ENT as an outpatient as needed.  Discharge Diagnosis:   Principal Problem:   Parotitis Active Problems:   Essential hypertension, benign   Diabetes ([place])   Acquired hypothyroidism   COPD (chronic obstructive pulmonary disease) (HCC)   CKD (chronic kidney disease), stage III   Acute blood loss anemia   Cellulitis of face    History of Present Illness / Brief narrative:  Heather Espinoza a 73 y.o.Fwith hx of COPD with alpha-1 antitrypsin deficiency, not on oxygen, DM, HTN, hypothyroidism, CKD 3, baseline creatinine 1.2, OSA not on CPAP, and recent L4-5 decompression and fusion. Patient presented to the ED on 11/17 with face swelling and confusion.  The patient was in her usual state of health (walking without a walker, no dementia, lives at home with her husband, had recuperated well from her recent lumbar fusion) until Sunday, she woke with new left-sided pain in the face and swelling. She was seen in the ER, prescribed Augmentin and discharged home.  Woke Monday very confused, progressively more confused over the course of the day, so called EMS in the evening. [place] where CT of the head showed left-sided parotitis, no fluid collection, surrounding cellulitis. She was given vancomycin, Zosyn, and IV fluids. Family requested transfer to [facility].  Hospital Course:  Left parotitis  Facial cellulitis -Not septic.  No evidence of fluid collection imaging.   -WBC count was elevated to 20,000.  Started on empiric antibiotic with IV vancomycin and IV Zosyn.   -Patient had significant improvement of swelling, redness and tenderness of the left parotid gland.  WBC count improved to normal.  Currently on IV Zosyn.  Will switch to oral Augmentin for next 5 days at home with probiotics.    Diabetes/HLD -Takes metformin and simvastatin at home.  Resume the same.    Hypertension -Blood pressure was initially low because of infection.  Home meds include propanolol, lisinopril and amlodipine.   -All of them have been resumed by now.    CKDIIIb -Stable relative to baseline  Recent lumbar surgery -There is a seroma on MRI L-spine that was obtained at an outside facility.  -Neurosurgery evaluated the patient. No need surgical intervention at this time.  COPD -Chart history of alpha-1 antitrypsin deficiency, no active bronchospasm on exam. -Does not use oxygen at baseline.  Anemia This appears to be a chronic normocytic anemia, with a superimposed acute blood loss after surgery. -Ferritin 150 -Vitamin B12 level 778.  Hypothyroidism -Continue Synthroid.  Obstructive sleep apnea Does not use CPAP  Fibromyalgia -Continue Wellbutrin, Cymbalta, mirtazapine -QT interval normal.  Stable for discharge to home today.  Subjective:  Seen and examined this morning.  Pleasant elderly Caucasian female.  Lying on bed.  Not in distress.  No new symptoms.  Blood pressure seems elevated this morning.  Home meds resumed.  Discharge Exam:   Vitals:   02/05/19 1935 02/05/19 2203 02/06/19 0008 02/06/19 0617  BP:  136/61 (!) 137/50 (!) 176/86  Pulse:  (!) 54 (!) 59 (!) 55  Resp:   16 16  Temp:  98.4 F (36.9 C) 97.7 F (36.5  C) (!) 97.4 F (36.3 C)  TempSrc:  Oral Oral Oral  SpO2: 98%  99% 98%  Weight:      Height:        Body mass index is 26.02 kg/m.  General exam: Appears calm and comfortable.  Not in physical distress Skin: No rashes, lesions or ulcers. HEENT: Atraumatic, normocephalic, supple neck, no obvious bleeding.  Improving  swelling, redness and tenderness in the left parotid gland area Lungs: Clear to auscultation bilaterally CVS: Regular rate and rhythm, no murmur GI/Abd soft, nondistended, nontender, bowel sound present CNS: Alert, awake, oriented x3 Psychiatry: Mood appropriate Extremities: No pedal edema, no calf tenderness  Discharge Instructions:  Wound care: None Discharge Instructions    Increase activity slowly   Complete by: As directed      Follow-up Information    Melissa Montane, MD Follow up.   Specialty: Otolaryngology Contact information: [address] [place] [address] [phone]          Allergies as of 02/06/2019      Reactions   Flagyl [metronidazole] Shortness Of Breath, Swelling   Lyrica [pregabalin] Shortness Of Breath   Adhesive [tape] Other (See Comments)   Takes off patient's skin   Aspirin Other (See Comments)   Stomach bleed   Latex Itching, Rash   Phenergan [promethazine Hcl] Other (See Comments)   Caused grogginess, altered mental status   Prednisone Anxiety   Sulfa Antibiotics Nausea Only   Tetanus Toxoids Swelling   Arm Area      Medication List    STOP taking these medications   traMADol 50 MG tablet Commonly known as: ULTRAM     TAKE these medications   albuterol 108 (90 Base) MCG/ACT inhaler Commonly known as: VENTOLIN HFA Inhale 2 puffs into the lungs every 6 (six) hours as needed for wheezing or shortness of breath.   albuterol (2.5 MG/3ML) 0.083% nebulizer solution Commonly known as: PROVENTIL Take 2.5 mg by nebulization every 6 (six) hours as needed for wheezing or shortness of breath.   amLODipine 5 MG tablet Commonly known as: NORVASC Take 1 tablet (5 mg total) by mouth daily.   amoxicillin-clavulanate 875-125 MG tablet Commonly known as: Augmentin Take 1 tablet by mouth every 12 (twelve) hours for 5 days.   aspirin 81 MG chewable tablet Chew 81 mg by mouth daily.   B-12 2500 MCG Subl Place 2,500 mcg under  the tongue daily.   Biotin 10 MG Tabs Take 10 mg by mouth daily.   bisacodyl 10 MG suppository Commonly known as: DULCOLAX Place 1 suppository (10 mg total) rectally as needed for moderate constipation.   buPROPion 150 MG 24 hr tablet Commonly known as: WELLBUTRIN XL Take 150 mg by mouth daily.   cholecalciferol 1000 units tablet Commonly known as: VITAMIN D Take 1,000 Units by mouth daily.   cyclobenzaprine 10 MG tablet Commonly known as: FLEXERIL Take 1 tablet (10 mg total) by mouth 3 (three) times daily as needed for muscle spasms.   dicyclomine 10 MG capsule Commonly known as: BENTYL TAKE 1 CAPSULE BY MOUTH TWICE DAILY AS NEEDED FOR  SPASMS What changed: See the new instructions.   docusate sodium 100 MG capsule Commonly known as: Colace Take 2 capsules (200 mg total) by mouth at bedtime. What changed: when to take this   DULoxetine 60 MG capsule Commonly known as: CYMBALTA Take 1 capsule by mouth twice daily   esomeprazole 40 MG capsule Commonly known as: NEXIUM Take 1  capsule (40 mg total) by mouth daily before breakfast.   famotidine 40 MG tablet Commonly known as: PEPCID TAKE 1 TABLET BY MOUTH AT BEDTIME   Fluticasone-Salmeterol 250-50 MCG/DOSE Aepb Commonly known as: ADVAIR Inhale 1 puff into the lungs every 12 (twelve) hours.   gabapentin 400 MG capsule Commonly known as: NEURONTIN Take 400 mg by mouth 2 (two) times daily.   GELATIN PO Take 1 capsule by mouth daily. After a meal   levothyroxine 75 MCG tablet Commonly known as: SYNTHROID Take 37.5 mcg by mouth daily before breakfast.   lisinopril 10 MG tablet Commonly known as: ZESTRIL Take 10 mg by mouth daily.   lubiprostone 8 MCG capsule Commonly known as: Amitiza Take 2 capsules (16 mcg total) by mouth daily with breakfast. What changed:   how much to take  when to take this  reasons to take this   metFORMIN 500 MG 24 hr tablet Commonly known as: GLUCOPHAGE-XR Take 500 mg by  mouth at bedtime.   mirtazapine 30 MG tablet Commonly known as: REMERON Take 30 mg by mouth at bedtime.   ondansetron 4 MG tablet Commonly known as: ZOFRAN TAKE 1 TABLET BY MOUTH TWICE DAILY AS NEEDED FOR NAUSEA FOR VOMITING What changed: See the new instructions.   oxyCODONE 5 MG immediate release tablet Commonly known as: Roxicodone Take 1 tablet (5 mg total) by mouth every 8 (eight) hours as needed for up to 5 days. What changed:   when to take this  reasons to take this   propranolol 20 MG tablet Commonly known as: INDERAL Take 1 tablet (20 mg total) by mouth 2 (two) times daily.   saccharomyces boulardii 250 MG capsule Commonly known as: FLORASTOR Take 1 capsule (250 mg total) by mouth 2 (two) times daily for 5 days.   simvastatin 20 MG tablet Commonly known as: ZOCOR Take 20 mg by mouth every evening.   traZODone 100 MG tablet Commonly known as: DESYREL Take 100 mg by mouth at bedtime as needed for sleep.   vitamin E 400 UNIT capsule Take 400 Units by mouth daily.       Time coordinating discharge: 35 minutes  The results of significant diagnostics from this hospitalization (including imaging, microbiology, ancillary and laboratory) are listed below for reference.    Procedures and Diagnostic Studies:   No results found.   Labs:   Basic Metabolic Panel: Recent Labs  Lab 02/03/19 1133 02/04/19 0418 02/05/19 1008  NA 139 144 140  K 3.7 4.1 3.3*  CL 108 113* 110  CO2 22 20* 22  GLUCOSE 122* 101* 117*  BUN 11 10 8   CREATININE 1.29* 1.17* 1.36*  CALCIUM 8.1* 8.1* 8.3*   GFR Estimated Creatinine Clearance: 32.8 mL/min (A) (by C-G formula based on SCr of 1.36 mg/dL (H)). Liver Function Tests: Recent Labs  Lab 02/03/19 1133  AST 12*  ALT 9  ALKPHOS 79  BILITOT 0.7  PROT 5.5*  ALBUMIN 2.3*   No results for input(s): LIPASE, AMYLASE in the last 168 hours. No results for input(s): AMMONIA in the last 168 hours. Coagulation profile Recent  Labs  Lab 02/03/19 1133  INR 1.3*    CBC: Recent Labs  Lab 02/03/19 1133 02/04/19 0625 02/05/19 1008  WBC 19.9* 14.7* 10.4  NEUTROABS 15.6*  --  6.0  HGB 8.6* 8.3* 8.6*  HCT 26.8* 24.7* 27.1*  MCV 94.0 92.9 93.4  PLT 374 337 374   Cardiac Enzymes: No results for input(s): CKTOTAL, CKMB, CKMBINDEX, TROPONINI  in the last 168 hours. BNP: Invalid input(s): POCBNP CBG: Recent Labs  Lab 02/05/19 0554 02/05/19 1106 02/05/19 1619 02/05/19 2127 02/06/19 0539  GLUCAP 88 94 103* 97 94   D-Dimer No results for input(s): DDIMER in the last 72 hours. Hgb A1c Recent Labs    02/04/19 0418  HGBA1C 6.0*   Lipid Profile No results for input(s): CHOL, HDL, LDLCALC, TRIG, CHOLHDL, LDLDIRECT in the last 72 hours. Thyroid function studies No results for input(s): TSH, T4TOTAL, T3FREE, THYROIDAB in the last 72 hours.  Invalid input(s): FREET3 Anemia work up [employer]    02/04/19 0418 02/04/19 0625  VITAMINB12 778  --   FERRITIN 150  --   TIBC 193*  --   IRON 11*  --   RETICCTPCT  --  1.3   Microbiology Recent Results (from the past 240 hour(s))  Blood Culture (routine x 2)     Status: None (Preliminary result)   Collection Time: 02/03/19 11:01 AM   Specimen: BLOOD  Result Value Ref Range Status   Specimen Description BLOOD SITE NOT SPECIFIED  Final   Special Requests   Final    BOTTLES DRAWN AEROBIC AND ANAEROBIC Blood Culture results may not be optimal due to an inadequate volume of blood received in culture bottles   Culture   Final    NO GROWTH 2 DAYS Performed at [facility], [place] [address] [place], [place] [address]    Report Status PENDING  Incomplete  Urine culture     Status: None   Collection Time: 02/03/19 11:01 AM   Specimen: In/Out Cath Urine  Result Value Ref Range Status   Specimen Description IN/OUT CATH URINE  Final   Special Requests NONE  Final   Culture   Final    NO GROWTH Performed at [facility], [place] [address]  [place], [place] [address]    Report Status 02/04/2019 FINAL  Final  SARS CORONAVIRUS 2 (TAT 6-24 HRS) Nasopharyngeal Nasopharyngeal Swab     Status: None   Collection Time: 02/03/19 11:19 AM   Specimen: Nasopharyngeal Swab  Result Value Ref Range Status   SARS Coronavirus 2 NEGATIVE NEGATIVE Final    Comment: (NOTE) SARS-CoV-2 target nucleic acids are NOT DETECTED. The SARS-CoV-2 RNA is generally detectable in upper and lower respiratory specimens during the acute phase of infection. Negative results do not preclude SARS-CoV-2 infection, do not rule out co-infections with other pathogens, and should not be used as the sole basis for treatment or other patient management decisions. Negative results must be combined with clinical observations, patient history, and epidemiological information. The expected result is Negative. Fact Sheet for Patients: SugarRoll.be Fact Sheet for Healthcare Providers: https://www.woods-mathews.com/ This test is not yet approved or cleared by the [place] FDA and  has been authorized for detection and/or diagnosis of SARS-CoV-2 by FDA under an Emergency Use Authorization (EUA). This EUA will remain  in effect (meaning this test can be used) for the duration of the COVID-19 declaration under Section 56 4(b)(1) of the Act, 21 U.S.C. section 360bbb-3(b)(1), unless the authorization is terminated or revoked sooner. Performed at [facility], [place] [address], [place],  [address]   Blood Culture (routine x 2)     Status: None (Preliminary result)   Collection Time: 02/03/19 11:42 AM   Specimen: BLOOD RIGHT HAND  Result Value Ref Range Status   Specimen Description BLOOD RIGHT HAND  Final   Special Requests   Final  BOTTLES DRAWN AEROBIC AND ANAEROBIC Blood Culture adequate volume   Culture   Final    NO GROWTH 2 DAYS Performed at [facility], [place] [address] [place], [place] [address]     Report Status PENDING  Incomplete    Please note: You were cared for by a hospitalist during your hospital stay. Once you are discharged, your primary care physician will handle any further medical issues. Please note that NO REFILLS for any discharge medications will be authorized once you are discharged, as it is imperative that you return to your primary care physician (or establish a relationship with a primary care physician if you do not have one) for your post hospital discharge needs so that they can reassess your need for medications and monitor your lab values.  Signed: Terrilee Croak  [facility] 02/06/2019, 9:20 AM

## 2019-02-06 NOTE — Progress Notes (Signed)
Chaplain completed Advanced Directive paperwork.  Heather Espinoza and her daughter received three copies, and nurse received one to input in chart.  Chaplain will follow-up as needed.

## 2019-02-06 NOTE — Progress Notes (Signed)
Physical Therapy Treatment Patient Details Name: Heather Espinoza MRN: [id number] DOB: 02-19-1946 Today's Date: 02/06/2019    History of Present Illness 73yo female s/p L4-L5 decompression done 01/12/19, now with new facial swelling and confusion. [place] positive for parotitis and cellulitis, then transferred to [facility] per family request. PMH COPD, DM, fibromyalgia, HTN, HLD, IBS, tachycardia, R elbow surgery    PT Comments    Pt progressing towards physical therapy goals. Pt sleeping upon arrival but was easily roused and willing to participate in PT session. Overall pt functioning at a supervision level with RW for support. Will continue to follow and progress as able per POC.     Follow Up Recommendations  Home health PT     Equipment Recommendations  Rolling walker with 5" wheels;3in1 (PT)    Recommendations for Other Services       Precautions / Restrictions Precautions Precautions: Fall;Back Precaution Booklet Issued: Yes (comment) Precaution Comments: Pt able to recall 1/3 precautions. Reviewed 3/3 Required Braces or Orthoses: Spinal Brace Spinal Brace: Lumbar corset(Did not bring with her to hospital) Restrictions Weight Bearing Restrictions: No    Mobility  Bed Mobility Overal bed mobility: Needs Assistance Bed Mobility: Rolling;Sidelying to Sit Rolling: Modified independent (Device/Increase time) Sidelying to sit: HOB elevated;Modified independent (Device/Increase time)       General bed mobility comments: Mod I with railings for support  Transfers Overall transfer level: Needs assistance Equipment used: Rolling walker (2 wheeled) Transfers: Sit to/from Stand Sit to Stand: Supervision         General transfer comment: VC's for improved posture and hand placement on seated surface for safety instead of pulling up from RW  Ambulation/Gait Ambulation/Gait assistance: Min guard;Supervision Gait Distance (Feet): 200 Feet Assistive  device: Rolling walker (2 wheeled) Gait Pattern/deviations: Step-through pattern;Decreased step length - right;Decreased step length - left;Decreased stride length;Trunk flexed Gait velocity: decreased Gait velocity interpretation: <1.8 ft/sec, indicate of risk for recurrent falls General Gait Details: min guard progressing to supervision for safety, no physical assist given   Stairs             Wheelchair Mobility    Modified Rankin (Stroke Patients Only)       Balance Overall balance assessment: Needs assistance Sitting-balance support: Bilateral upper extremity supported;Feet supported Sitting balance-Leahy Scale: Normal     Standing balance support: During functional activity;Bilateral upper extremity supported Standing balance-Leahy Scale: Fair Standing balance comment: Required UE support on RW.                             Cognition Arousal/Alertness: Awake/alert Behavior During Therapy: WFL for tasks assessed/performed Overall Cognitive Status: Within Functional Limits for tasks assessed                                 General Comments: occasional cues to maintain back precautions      Exercises      General Comments        Pertinent Vitals/Pain Pain Assessment: Faces Faces Pain Scale: Hurts little more Pain Location: Jaw (L), mild pain in back during ambulation Pain Descriptors / Indicators: Grimacing;Aching Pain Intervention(s): Limited activity within patient's tolerance;Monitored during session;Repositioned    Home Living                      Prior Function  PT Goals (current goals can now be found in the care plan section) Acute Rehab PT Goals Patient Stated Goal: feel better PT Goal Formulation: With patient Time For Goal Achievement: 02/19/19 Potential to Achieve Goals: Good Progress towards PT goals: Progressing toward goals    Frequency    Min 3X/week      PT Plan Current plan  remains appropriate    Co-evaluation              AM-PAC PT "6 Clicks" Mobility   Outcome Measure  Help needed turning from your back to your side while in a flat bed without using bedrails?: A Little Help needed moving from lying on your back to sitting on the side of a flat bed without using bedrails?: A Little Help needed moving to and from a bed to a chair (including a wheelchair)?: A Little Help needed standing up from a chair using your arms (e.g., wheelchair or bedside chair)?: A Little Help needed to walk in hospital room?: A Little Help needed climbing 3-5 steps with a railing? : A Little 6 Click Score: 18    End of Session   Activity Tolerance: Patient tolerated treatment well Patient left: in chair;with call bell/phone within reach;with chair alarm set;with family/visitor present Nurse Communication: Mobility status PT Visit Diagnosis: Unsteadiness on feet (R26.81);Difficulty in walking, not elsewhere classified (R26.2);Muscle weakness (generalized) (M62.81) Pain - Right/Left: (back pain) Pain - part of body: (back pain)     Time: [id number] PT Time Calculation (min) (ACUTE ONLY): 18 min  Charges:  $Gait Training: 8-22 mins                     [name], PT, DPT Acute Rehabilitation Services Pager: [phone] Office: [phone]    [name] 02/06/2019, 12:47 PM

## 2019-02-06 NOTE — TOC Transition Note (Signed)
Transition of Care [name]) - CM/SW Discharge Note   Patient Details  Name: Heather Espinoza MRN: [id number] Date of Birth: 27-Feb-1946  [name] CM/SW Contact:  [name], RN Phone Number: 02/06/2019, 9:41 AM   Clinical Narrative:    [name] at  [name] aware  Discharge today , orders faxed [phone] and received 02/05/19. Discharge summary faxed this am. Start of care date 02/07/19  Final next level of care: [name] Barriers to Discharge: Continued Medical Work up   Patient Goals and CMS Choice Patient states their goals for this hospitalization and ongoing recovery are:: to get stronger CMS Medicare.gov Compare Post Acute Care list provided to:: Patient Choice offered to / list presented to : Patient, Adult Children  Discharge Placement                       Discharge Plan and Services   Discharge Planning Services: CM Consult Post Acute Care Choice: Home Health          DME Arranged: N/A         HH Arranged: PT, OT, RN       Representative spoke with at [name]: [name] spoke with [place] Determinants of Health ([place]) Interventions     Readmission Risk Interventions Readmission Risk Prevention Plan 02/05/2019  Transportation Screening Complete  PCP or Specialist Appt within 3-5 Days Complete  HRI or [place] Complete  Social Work Consult for [place] Planning/Counseling Complete  Palliative Care Screening Not Applicable  Medication Review Press photographer) Referral to Pharmacy  Some recent data might be hidden

## 2019-02-08 LAB — CULTURE, BLOOD (ROUTINE X 2)
Culture: NO GROWTH
Culture: NO GROWTH
Special Requests: ADEQUATE

## 2019-02-10 DIAGNOSIS — M549 Dorsalgia, unspecified: Secondary | ICD-10-CM | POA: Diagnosis not present

## 2019-02-10 DIAGNOSIS — E1169 Type 2 diabetes mellitus with other specified complication: Secondary | ICD-10-CM | POA: Diagnosis not present

## 2019-02-10 DIAGNOSIS — I10 Essential (primary) hypertension: Secondary | ICD-10-CM | POA: Diagnosis not present

## 2019-02-10 DIAGNOSIS — Z09 Encounter for follow-up examination after completed treatment for conditions other than malignant neoplasm: Secondary | ICD-10-CM | POA: Diagnosis not present

## 2019-02-10 DIAGNOSIS — K1120 Sialoadenitis, unspecified: Secondary | ICD-10-CM | POA: Diagnosis not present

## 2019-02-10 DIAGNOSIS — D649 Anemia, unspecified: Secondary | ICD-10-CM | POA: Diagnosis not present

## 2019-02-14 DIAGNOSIS — M5416 Radiculopathy, lumbar region: Secondary | ICD-10-CM | POA: Diagnosis not present

## 2019-02-14 DIAGNOSIS — G4730 Sleep apnea, unspecified: Secondary | ICD-10-CM | POA: Diagnosis not present

## 2019-02-14 DIAGNOSIS — E119 Type 2 diabetes mellitus without complications: Secondary | ICD-10-CM | POA: Diagnosis not present

## 2019-02-14 DIAGNOSIS — N200 Calculus of kidney: Secondary | ICD-10-CM | POA: Diagnosis not present

## 2019-02-14 DIAGNOSIS — M1990 Unspecified osteoarthritis, unspecified site: Secondary | ICD-10-CM | POA: Diagnosis not present

## 2019-02-14 DIAGNOSIS — M48062 Spinal stenosis, lumbar region with neurogenic claudication: Secondary | ICD-10-CM | POA: Diagnosis not present

## 2019-02-14 DIAGNOSIS — G250 Essential tremor: Secondary | ICD-10-CM | POA: Diagnosis not present

## 2019-02-14 DIAGNOSIS — M4316 Spondylolisthesis, lumbar region: Secondary | ICD-10-CM | POA: Diagnosis not present

## 2019-02-14 DIAGNOSIS — Z4789 Encounter for other orthopedic aftercare: Secondary | ICD-10-CM | POA: Diagnosis not present

## 2019-02-14 DIAGNOSIS — E215 Disorder of parathyroid gland, unspecified: Secondary | ICD-10-CM | POA: Diagnosis not present

## 2019-02-14 DIAGNOSIS — Z7984 Long term (current) use of oral hypoglycemic drugs: Secondary | ICD-10-CM | POA: Diagnosis not present

## 2019-02-14 DIAGNOSIS — K5790 Diverticulosis of intestine, part unspecified, without perforation or abscess without bleeding: Secondary | ICD-10-CM | POA: Diagnosis not present

## 2019-02-14 DIAGNOSIS — Z8701 Personal history of pneumonia (recurrent): Secondary | ICD-10-CM | POA: Diagnosis not present

## 2019-02-14 DIAGNOSIS — K589 Irritable bowel syndrome without diarrhea: Secondary | ICD-10-CM | POA: Diagnosis not present

## 2019-02-14 DIAGNOSIS — M5136 Other intervertebral disc degeneration, lumbar region: Secondary | ICD-10-CM | POA: Diagnosis not present

## 2019-02-14 DIAGNOSIS — Z7982 Long term (current) use of aspirin: Secondary | ICD-10-CM | POA: Diagnosis not present

## 2019-02-14 DIAGNOSIS — I10 Essential (primary) hypertension: Secondary | ICD-10-CM | POA: Diagnosis not present

## 2019-02-14 DIAGNOSIS — E039 Hypothyroidism, unspecified: Secondary | ICD-10-CM | POA: Diagnosis not present

## 2019-02-14 DIAGNOSIS — M8580 Other specified disorders of bone density and structure, unspecified site: Secondary | ICD-10-CM | POA: Diagnosis not present

## 2019-02-14 DIAGNOSIS — M797 Fibromyalgia: Secondary | ICD-10-CM | POA: Diagnosis not present

## 2019-02-14 DIAGNOSIS — G2581 Restless legs syndrome: Secondary | ICD-10-CM | POA: Diagnosis not present

## 2019-02-14 DIAGNOSIS — J449 Chronic obstructive pulmonary disease, unspecified: Secondary | ICD-10-CM | POA: Diagnosis not present

## 2019-02-19 DIAGNOSIS — M48062 Spinal stenosis, lumbar region with neurogenic claudication: Secondary | ICD-10-CM | POA: Diagnosis not present

## 2019-02-19 DIAGNOSIS — E215 Disorder of parathyroid gland, unspecified: Secondary | ICD-10-CM | POA: Diagnosis not present

## 2019-02-19 DIAGNOSIS — M5416 Radiculopathy, lumbar region: Secondary | ICD-10-CM | POA: Diagnosis not present

## 2019-02-19 DIAGNOSIS — Z4789 Encounter for other orthopedic aftercare: Secondary | ICD-10-CM | POA: Diagnosis not present

## 2019-02-19 DIAGNOSIS — L03115 Cellulitis of right lower limb: Secondary | ICD-10-CM | POA: Diagnosis not present

## 2019-02-19 DIAGNOSIS — M5136 Other intervertebral disc degeneration, lumbar region: Secondary | ICD-10-CM | POA: Diagnosis not present

## 2019-02-19 DIAGNOSIS — M4316 Spondylolisthesis, lumbar region: Secondary | ICD-10-CM | POA: Diagnosis not present

## 2019-02-19 DIAGNOSIS — M109 Gout, unspecified: Secondary | ICD-10-CM | POA: Diagnosis not present

## 2019-02-20 DIAGNOSIS — Z4789 Encounter for other orthopedic aftercare: Secondary | ICD-10-CM | POA: Diagnosis not present

## 2019-02-20 DIAGNOSIS — M5136 Other intervertebral disc degeneration, lumbar region: Secondary | ICD-10-CM | POA: Diagnosis not present

## 2019-02-20 DIAGNOSIS — M5416 Radiculopathy, lumbar region: Secondary | ICD-10-CM | POA: Diagnosis not present

## 2019-02-20 DIAGNOSIS — M4316 Spondylolisthesis, lumbar region: Secondary | ICD-10-CM | POA: Diagnosis not present

## 2019-02-20 DIAGNOSIS — M48062 Spinal stenosis, lumbar region with neurogenic claudication: Secondary | ICD-10-CM | POA: Diagnosis not present

## 2019-02-20 DIAGNOSIS — E215 Disorder of parathyroid gland, unspecified: Secondary | ICD-10-CM | POA: Diagnosis not present

## 2019-02-23 DIAGNOSIS — M25579 Pain in unspecified ankle and joints of unspecified foot: Secondary | ICD-10-CM | POA: Diagnosis not present

## 2019-02-23 DIAGNOSIS — M79671 Pain in right foot: Secondary | ICD-10-CM | POA: Diagnosis not present

## 2019-02-23 DIAGNOSIS — M79672 Pain in left foot: Secondary | ICD-10-CM | POA: Diagnosis not present

## 2019-02-25 DIAGNOSIS — Z4789 Encounter for other orthopedic aftercare: Secondary | ICD-10-CM | POA: Diagnosis not present

## 2019-02-25 DIAGNOSIS — M4316 Spondylolisthesis, lumbar region: Secondary | ICD-10-CM | POA: Diagnosis not present

## 2019-02-25 DIAGNOSIS — M48062 Spinal stenosis, lumbar region with neurogenic claudication: Secondary | ICD-10-CM | POA: Diagnosis not present

## 2019-02-25 DIAGNOSIS — M5416 Radiculopathy, lumbar region: Secondary | ICD-10-CM | POA: Diagnosis not present

## 2019-02-25 DIAGNOSIS — M5136 Other intervertebral disc degeneration, lumbar region: Secondary | ICD-10-CM | POA: Diagnosis not present

## 2019-02-25 DIAGNOSIS — E215 Disorder of parathyroid gland, unspecified: Secondary | ICD-10-CM | POA: Diagnosis not present

## 2019-02-26 DIAGNOSIS — M4316 Spondylolisthesis, lumbar region: Secondary | ICD-10-CM | POA: Diagnosis not present

## 2019-02-26 DIAGNOSIS — I10 Essential (primary) hypertension: Secondary | ICD-10-CM | POA: Diagnosis not present

## 2019-02-27 DIAGNOSIS — M5416 Radiculopathy, lumbar region: Secondary | ICD-10-CM | POA: Diagnosis not present

## 2019-02-27 DIAGNOSIS — M5136 Other intervertebral disc degeneration, lumbar region: Secondary | ICD-10-CM | POA: Diagnosis not present

## 2019-02-27 DIAGNOSIS — M48062 Spinal stenosis, lumbar region with neurogenic claudication: Secondary | ICD-10-CM | POA: Diagnosis not present

## 2019-02-27 DIAGNOSIS — E215 Disorder of parathyroid gland, unspecified: Secondary | ICD-10-CM | POA: Diagnosis not present

## 2019-02-27 DIAGNOSIS — M4316 Spondylolisthesis, lumbar region: Secondary | ICD-10-CM | POA: Diagnosis not present

## 2019-02-27 DIAGNOSIS — Z4789 Encounter for other orthopedic aftercare: Secondary | ICD-10-CM | POA: Diagnosis not present

## 2019-03-04 DIAGNOSIS — E215 Disorder of parathyroid gland, unspecified: Secondary | ICD-10-CM | POA: Diagnosis not present

## 2019-03-04 DIAGNOSIS — M4316 Spondylolisthesis, lumbar region: Secondary | ICD-10-CM | POA: Diagnosis not present

## 2019-03-04 DIAGNOSIS — M5416 Radiculopathy, lumbar region: Secondary | ICD-10-CM | POA: Diagnosis not present

## 2019-03-04 DIAGNOSIS — Z4789 Encounter for other orthopedic aftercare: Secondary | ICD-10-CM | POA: Diagnosis not present

## 2019-03-04 DIAGNOSIS — M48062 Spinal stenosis, lumbar region with neurogenic claudication: Secondary | ICD-10-CM | POA: Diagnosis not present

## 2019-03-04 DIAGNOSIS — M5136 Other intervertebral disc degeneration, lumbar region: Secondary | ICD-10-CM | POA: Diagnosis not present

## 2019-03-05 DIAGNOSIS — M5136 Other intervertebral disc degeneration, lumbar region: Secondary | ICD-10-CM | POA: Diagnosis not present

## 2019-03-05 DIAGNOSIS — M48062 Spinal stenosis, lumbar region with neurogenic claudication: Secondary | ICD-10-CM | POA: Diagnosis not present

## 2019-03-05 DIAGNOSIS — M4316 Spondylolisthesis, lumbar region: Secondary | ICD-10-CM | POA: Diagnosis not present

## 2019-03-05 DIAGNOSIS — E215 Disorder of parathyroid gland, unspecified: Secondary | ICD-10-CM | POA: Diagnosis not present

## 2019-03-05 DIAGNOSIS — Z4789 Encounter for other orthopedic aftercare: Secondary | ICD-10-CM | POA: Diagnosis not present

## 2019-03-05 DIAGNOSIS — M5416 Radiculopathy, lumbar region: Secondary | ICD-10-CM | POA: Diagnosis not present

## 2019-03-11 DIAGNOSIS — M5416 Radiculopathy, lumbar region: Secondary | ICD-10-CM | POA: Diagnosis not present

## 2019-03-11 DIAGNOSIS — M48062 Spinal stenosis, lumbar region with neurogenic claudication: Secondary | ICD-10-CM | POA: Diagnosis not present

## 2019-03-11 DIAGNOSIS — M5136 Other intervertebral disc degeneration, lumbar region: Secondary | ICD-10-CM | POA: Diagnosis not present

## 2019-03-11 DIAGNOSIS — Z4789 Encounter for other orthopedic aftercare: Secondary | ICD-10-CM | POA: Diagnosis not present

## 2019-03-11 DIAGNOSIS — E215 Disorder of parathyroid gland, unspecified: Secondary | ICD-10-CM | POA: Diagnosis not present

## 2019-03-11 DIAGNOSIS — M4316 Spondylolisthesis, lumbar region: Secondary | ICD-10-CM | POA: Diagnosis not present

## 2019-03-16 DIAGNOSIS — G250 Essential tremor: Secondary | ICD-10-CM | POA: Diagnosis not present

## 2019-03-16 DIAGNOSIS — Z8701 Personal history of pneumonia (recurrent): Secondary | ICD-10-CM | POA: Diagnosis not present

## 2019-03-16 DIAGNOSIS — N200 Calculus of kidney: Secondary | ICD-10-CM | POA: Diagnosis not present

## 2019-03-16 DIAGNOSIS — M8580 Other specified disorders of bone density and structure, unspecified site: Secondary | ICD-10-CM | POA: Diagnosis not present

## 2019-03-16 DIAGNOSIS — Z4789 Encounter for other orthopedic aftercare: Secondary | ICD-10-CM | POA: Diagnosis not present

## 2019-03-16 DIAGNOSIS — M48062 Spinal stenosis, lumbar region with neurogenic claudication: Secondary | ICD-10-CM | POA: Diagnosis not present

## 2019-03-16 DIAGNOSIS — E215 Disorder of parathyroid gland, unspecified: Secondary | ICD-10-CM | POA: Diagnosis not present

## 2019-03-16 DIAGNOSIS — E119 Type 2 diabetes mellitus without complications: Secondary | ICD-10-CM | POA: Diagnosis not present

## 2019-03-16 DIAGNOSIS — M1990 Unspecified osteoarthritis, unspecified site: Secondary | ICD-10-CM | POA: Diagnosis not present

## 2019-03-16 DIAGNOSIS — J449 Chronic obstructive pulmonary disease, unspecified: Secondary | ICD-10-CM | POA: Diagnosis not present

## 2019-03-16 DIAGNOSIS — M797 Fibromyalgia: Secondary | ICD-10-CM | POA: Diagnosis not present

## 2019-03-16 DIAGNOSIS — Z7982 Long term (current) use of aspirin: Secondary | ICD-10-CM | POA: Diagnosis not present

## 2019-03-16 DIAGNOSIS — Z7984 Long term (current) use of oral hypoglycemic drugs: Secondary | ICD-10-CM | POA: Diagnosis not present

## 2019-03-16 DIAGNOSIS — K589 Irritable bowel syndrome without diarrhea: Secondary | ICD-10-CM | POA: Diagnosis not present

## 2019-03-16 DIAGNOSIS — K5790 Diverticulosis of intestine, part unspecified, without perforation or abscess without bleeding: Secondary | ICD-10-CM | POA: Diagnosis not present

## 2019-03-16 DIAGNOSIS — M4316 Spondylolisthesis, lumbar region: Secondary | ICD-10-CM | POA: Diagnosis not present

## 2019-03-16 DIAGNOSIS — G2581 Restless legs syndrome: Secondary | ICD-10-CM | POA: Diagnosis not present

## 2019-03-16 DIAGNOSIS — E039 Hypothyroidism, unspecified: Secondary | ICD-10-CM | POA: Diagnosis not present

## 2019-03-16 DIAGNOSIS — G4730 Sleep apnea, unspecified: Secondary | ICD-10-CM | POA: Diagnosis not present

## 2019-03-16 DIAGNOSIS — M5116 Intervertebral disc disorders with radiculopathy, lumbar region: Secondary | ICD-10-CM | POA: Diagnosis not present

## 2019-03-16 DIAGNOSIS — I10 Essential (primary) hypertension: Secondary | ICD-10-CM | POA: Diagnosis not present

## 2019-03-17 ENCOUNTER — Encounter (INDEPENDENT_AMBULATORY_CARE_PROVIDER_SITE_OTHER): Payer: Self-pay | Admitting: Internal Medicine

## 2019-03-17 ENCOUNTER — Other Ambulatory Visit: Payer: Self-pay

## 2019-03-17 ENCOUNTER — Ambulatory Visit (INDEPENDENT_AMBULATORY_CARE_PROVIDER_SITE_OTHER): Payer: Medicare Other | Admitting: Internal Medicine

## 2019-03-17 VITALS — Wt 127.0 lb

## 2019-03-17 DIAGNOSIS — K582 Mixed irritable bowel syndrome: Secondary | ICD-10-CM

## 2019-03-17 DIAGNOSIS — R1319 Other dysphagia: Secondary | ICD-10-CM

## 2019-03-17 DIAGNOSIS — R110 Nausea: Secondary | ICD-10-CM

## 2019-03-17 DIAGNOSIS — K588 Other irritable bowel syndrome: Secondary | ICD-10-CM

## 2019-03-17 DIAGNOSIS — R112 Nausea with vomiting, unspecified: Secondary | ICD-10-CM | POA: Insufficient documentation

## 2019-03-17 DIAGNOSIS — K219 Gastro-esophageal reflux disease without esophagitis: Secondary | ICD-10-CM | POA: Diagnosis not present

## 2019-03-17 DIAGNOSIS — R197 Diarrhea, unspecified: Secondary | ICD-10-CM | POA: Insufficient documentation

## 2019-03-17 MED ORDER — DICYCLOMINE HCL 10 MG PO CAPS: 10.0000 mg | ORAL_CAPSULE | Freq: Three times a day (TID) | ORAL | 5 refills | Status: DC | PRN

## 2019-03-17 MED ORDER — ONDANSETRON HCL 4 MG PO TABS: ORAL_TABLET | ORAL | 0 refills | Status: DC

## 2019-03-17 NOTE — Progress Notes (Signed)
Virtual Visit via Telephone Note  Patient had scheduled face-to-face visit today.  Visit was changed to virtual/telephone visit because of ongoing Covid-19 pandemic and we both agree. I connected with [name] on 03/17/19 at 10:53 AM EST by telephone and verified that I am speaking with the correct person using two identifiers.  Location: Patient: home Provider: office   I discussed the limitations, risks, security and privacy concerns of performing an evaluation and management service by telephone and the availability of in person appointments. I also discussed with the patient that there may be a patient responsible charge related to this service. The patient expressed understanding and agreed to proceed.   History of Present Illness:  Patient is 73 year old Caucasian female who has chronic GERD IBS with both diarrhea and constipation as well as nausea who was last virtual visit was on 07/15/2018. Patient states she is not doing well.  She had back surgery on 01/12/2019.  Patient reports that she developed wound infection and had to be on an antibiotic for 3 weeks.  She has had more diarrhea since she has been on antibiotics but it is controlled with Imodium which she has been using Linzess every day.  She is presently on cephalexin 500 mg twice a day which was started when she was seen in the urgent care center and diagnosed with gout.  She denies melena or rectal bleeding.  She says she is having 2-3 bowel movements per day.  She has noticed some abdominal cramping and she has had few accidents.  However she is not having melena rectal bleeding or fever.  She says she has lost 12 pounds since her surgery.  She says her appetite was not good but it is getting better.  She does need a refill on ondansetron as she is experiencing nausea more often.  She has not had any vomiting.  She also needs a prescription for dicyclomine that she uses on as-needed basis for IBS/spasms.   Med list reviewed  and updated. Current Outpatient Medications  Medication Sig Dispense Refill  . albuterol (PROVENTIL HFA;VENTOLIN HFA) 108 (90 Base) MCG/ACT inhaler Inhale 2 puffs into the lungs every 6 (six) hours as needed for wheezing or shortness of breath.    Marland Kitchen albuterol (PROVENTIL) (2.5 MG/3ML) 0.083% nebulizer solution Take 2.5 mg by nebulization every 6 (six) hours as needed for wheezing or shortness of breath.    Marland Kitchen amLODipine (NORVASC) 5 MG tablet Take 1 tablet (5 mg total) by mouth daily. 90 tablet 3  . aspirin 81 MG chewable tablet Chew 81 mg by mouth daily.    . Biotin 10 MG TABS Take 10 mg by mouth daily.     Marland Kitchen buPROPion (WELLBUTRIN XL) 150 MG 24 hr tablet Take 150 mg by mouth daily.     . cephALEXin (KEFLEX) 500 MG capsule Take 500 mg by mouth 2 (two) times daily.     . cholecalciferol (VITAMIN D) 1000 units tablet Take 1,000 Units by mouth daily.     . Cyanocobalamin (B-12) 2500 MCG SUBL Place 2,500 mcg under the tongue daily.    . cyclobenzaprine (FLEXERIL) 10 MG tablet Take 1 tablet (10 mg total) by mouth 3 (three) times daily as needed for muscle spasms. 50 tablet 0  . dicyclomine (BENTYL) 10 MG capsule Take 1 capsule (10 mg total) by mouth 3 (three) times daily as needed for spasms. 90 capsule 5  . docusate sodium (COLACE) 100 MG capsule Take 2 capsules (200 mg total) by  mouth at bedtime. (Patient taking differently: Take 200 mg by mouth daily. ) 10 capsule 0  . DULoxetine (CYMBALTA) 60 MG capsule Take 1 capsule by mouth twice daily (Patient taking differently: Take 60 mg by mouth 2 (two) times daily. ) 180 capsule 1  . esomeprazole (NEXIUM) 40 MG capsule Take 1 capsule (40 mg total) by mouth daily before breakfast. 30 capsule 5  . famotidine (PEPCID) 40 MG tablet TAKE 1 TABLET BY MOUTH AT BEDTIME (Patient taking differently: Take 40 mg by mouth at bedtime. ) 30 tablet 1  . Fluticasone-Salmeterol (ADVAIR) 250-50 MCG/DOSE AEPB Inhale 1 puff into the lungs every 12 (twelve) hours.    . gabapentin  (NEURONTIN) 400 MG capsule Take 400 mg by mouth 2 (two) times daily.     Marland Kitchen GELATIN PO Take 1 capsule by mouth daily. After a meal    . levothyroxine (SYNTHROID, LEVOTHROID) 75 MCG tablet Take 37.5 mcg by mouth daily before breakfast.     . lisinopril (PRINIVIL,ZESTRIL) 10 MG tablet Take 10 mg by mouth daily.     . metFORMIN (GLUCOPHAGE-XR) 500 MG 24 hr tablet Take 500 mg by mouth at bedtime.    . mirtazapine (REMERON) 30 MG tablet Take 30 mg by mouth at bedtime.     . ondansetron (ZOFRAN) 4 MG tablet TAKE 1 TABLET BY MOUTH TWICE DAILY AS NEEDED FOR NAUSEA FOR VOMITING 30 tablet 0  . propranolol (INDERAL) 20 MG tablet Take 1 tablet (20 mg total) by mouth 2 (two) times daily. 180 tablet 2  . simvastatin (ZOCOR) 20 MG tablet Take 20 mg by mouth every evening.    . traZODone (DESYREL) 100 MG tablet Take 100 mg by mouth at bedtime as needed for sleep.     . vitamin E 400 UNIT capsule Take 400 Units by mouth daily.    . bisacodyl (DULCOLAX) 10 MG suppository Place 1 suppository (10 mg total) rectally as needed for moderate constipation. (Patient not taking: Reported on 03/17/2019) 12 suppository 0  . lubiprostone (AMITIZA) 8 MCG capsule Take 2 capsules (16 mcg total) by mouth daily with breakfast. (Patient not taking: Reported on 03/17/2019) 48 capsule 0   No current facility-administered medications for this visit.     Observations/Objective:  Patient reported her weight to be 127 pounds. Previous weight was 139 pounds on 07/15/2018.  Assessment and Plan:  #1.  Chronic GERD.  Heartburn is well controlled with PPI.  While she is having so many issues will not make any changes to PPI dose.  #2.  Irritable bowel syndrome.  She has IBS both with diarrhea and constipation but lately she has been having more diarrhea possibly related to antibiotic use for postop wound impaction.  I am not sure why she is on cephalexin now if she was diagnosed with gouty arthritis.  Patient advised to check with her  primary care physician whether not she should continue antibiotic.  #3.  Chronic/intermittent nausea.  Suspect this is central or not due to GI disorder.  Antibiotic therapy may have made it worse.  Need to rule out C. difficile colitis.  #4.  Esophageal dysphagia.  She has history of esophageal valve.  Would consider EGD with dilation once acute issues have resolved.  Follow Up Instructions:  Stool for C. difficile testing. Patient advised to call office should she develop fever. Patient will call if dysphagia worsens or she has an episode of food impaction. New prescription for ondansetron and dicyclomine sent to patient's pharmacy. Office visit  in 3 months.  I discussed the assessment and treatment plan with the patient. The patient was provided an opportunity to ask questions and all were answered. The patient agreed with the plan and demonstrated an understanding of the instructions.   The patient was advised to call back or seek an in-person evaluation if the symptoms worsen or if the condition fails to improve as anticipated.  I provided 13 minutes of non-face-to-face time during this encounter.   Hildred Laser, MD

## 2019-03-17 NOTE — Patient Instructions (Signed)
Patient to provide stool sample as soon as possible. Patient advised to call office should she develop fever.

## 2019-03-18 ENCOUNTER — Telehealth (INDEPENDENT_AMBULATORY_CARE_PROVIDER_SITE_OTHER): Payer: Self-pay | Admitting: Internal Medicine

## 2019-03-18 NOTE — Telephone Encounter (Signed)
Patient left voice mail message stating she was to come to [place] lab and pick up container for stool sample - patient stated she lives [place] hour [place] half away and would like to know if there are other options - ph# [phone]

## 2019-03-18 NOTE — Telephone Encounter (Signed)
There are no other options. Please advise patient.

## 2019-03-23 DIAGNOSIS — M79671 Pain in right foot: Secondary | ICD-10-CM | POA: Diagnosis not present

## 2019-03-23 DIAGNOSIS — M79672 Pain in left foot: Secondary | ICD-10-CM | POA: Diagnosis not present

## 2019-03-23 DIAGNOSIS — E1140 Type 2 diabetes mellitus with diabetic neuropathy, unspecified: Secondary | ICD-10-CM | POA: Diagnosis not present

## 2019-03-23 DIAGNOSIS — E1151 Type 2 diabetes mellitus with diabetic peripheral angiopathy without gangrene: Secondary | ICD-10-CM | POA: Diagnosis not present

## 2019-03-23 DIAGNOSIS — M25579 Pain in unspecified ankle and joints of unspecified foot: Secondary | ICD-10-CM | POA: Diagnosis not present

## 2019-03-23 DIAGNOSIS — L600 Ingrowing nail: Secondary | ICD-10-CM | POA: Diagnosis not present

## 2019-03-25 DIAGNOSIS — M48062 Spinal stenosis, lumbar region with neurogenic claudication: Secondary | ICD-10-CM | POA: Diagnosis not present

## 2019-03-25 DIAGNOSIS — I10 Essential (primary) hypertension: Secondary | ICD-10-CM | POA: Diagnosis not present

## 2019-03-25 DIAGNOSIS — E215 Disorder of parathyroid gland, unspecified: Secondary | ICD-10-CM | POA: Diagnosis not present

## 2019-03-25 DIAGNOSIS — Z4789 Encounter for other orthopedic aftercare: Secondary | ICD-10-CM | POA: Diagnosis not present

## 2019-03-25 DIAGNOSIS — M4316 Spondylolisthesis, lumbar region: Secondary | ICD-10-CM | POA: Diagnosis not present

## 2019-03-25 DIAGNOSIS — M5116 Intervertebral disc disorders with radiculopathy, lumbar region: Secondary | ICD-10-CM | POA: Diagnosis not present

## 2019-03-26 DIAGNOSIS — K1121 Acute sialoadenitis: Secondary | ICD-10-CM | POA: Diagnosis not present

## 2019-03-31 ENCOUNTER — Other Ambulatory Visit (INDEPENDENT_AMBULATORY_CARE_PROVIDER_SITE_OTHER): Payer: Self-pay | Admitting: Internal Medicine

## 2019-04-01 DIAGNOSIS — M48062 Spinal stenosis, lumbar region with neurogenic claudication: Secondary | ICD-10-CM | POA: Diagnosis not present

## 2019-04-01 DIAGNOSIS — I10 Essential (primary) hypertension: Secondary | ICD-10-CM | POA: Diagnosis not present

## 2019-04-01 DIAGNOSIS — M5116 Intervertebral disc disorders with radiculopathy, lumbar region: Secondary | ICD-10-CM | POA: Diagnosis not present

## 2019-04-01 DIAGNOSIS — E215 Disorder of parathyroid gland, unspecified: Secondary | ICD-10-CM | POA: Diagnosis not present

## 2019-04-01 DIAGNOSIS — Z4789 Encounter for other orthopedic aftercare: Secondary | ICD-10-CM | POA: Diagnosis not present

## 2019-04-01 DIAGNOSIS — M4316 Spondylolisthesis, lumbar region: Secondary | ICD-10-CM | POA: Diagnosis not present

## 2019-04-07 DIAGNOSIS — D649 Anemia, unspecified: Secondary | ICD-10-CM | POA: Diagnosis not present

## 2019-04-07 DIAGNOSIS — M5116 Intervertebral disc disorders with radiculopathy, lumbar region: Secondary | ICD-10-CM | POA: Diagnosis not present

## 2019-04-07 DIAGNOSIS — M48062 Spinal stenosis, lumbar region with neurogenic claudication: Secondary | ICD-10-CM | POA: Diagnosis not present

## 2019-04-07 DIAGNOSIS — I10 Essential (primary) hypertension: Secondary | ICD-10-CM | POA: Diagnosis not present

## 2019-04-07 DIAGNOSIS — Z4789 Encounter for other orthopedic aftercare: Secondary | ICD-10-CM | POA: Diagnosis not present

## 2019-04-07 DIAGNOSIS — M549 Dorsalgia, unspecified: Secondary | ICD-10-CM | POA: Diagnosis not present

## 2019-04-07 DIAGNOSIS — E785 Hyperlipidemia, unspecified: Secondary | ICD-10-CM | POA: Diagnosis not present

## 2019-04-07 DIAGNOSIS — Z0000 Encounter for general adult medical examination without abnormal findings: Secondary | ICD-10-CM | POA: Diagnosis not present

## 2019-04-07 DIAGNOSIS — M4316 Spondylolisthesis, lumbar region: Secondary | ICD-10-CM | POA: Diagnosis not present

## 2019-04-07 DIAGNOSIS — E215 Disorder of parathyroid gland, unspecified: Secondary | ICD-10-CM | POA: Diagnosis not present

## 2019-04-07 DIAGNOSIS — E1169 Type 2 diabetes mellitus with other specified complication: Secondary | ICD-10-CM | POA: Diagnosis not present

## 2019-04-09 ENCOUNTER — Other Ambulatory Visit (INDEPENDENT_AMBULATORY_CARE_PROVIDER_SITE_OTHER): Payer: Self-pay | Admitting: Nurse Practitioner

## 2019-04-11 NOTE — Telephone Encounter (Signed)
Thayer Headings, pls manage rx request thx

## 2019-04-14 ENCOUNTER — Other Ambulatory Visit (INDEPENDENT_AMBULATORY_CARE_PROVIDER_SITE_OTHER): Payer: Self-pay | Admitting: Internal Medicine

## 2019-04-15 DIAGNOSIS — Z4789 Encounter for other orthopedic aftercare: Secondary | ICD-10-CM | POA: Diagnosis not present

## 2019-04-15 DIAGNOSIS — G2581 Restless legs syndrome: Secondary | ICD-10-CM | POA: Diagnosis not present

## 2019-04-15 DIAGNOSIS — G4730 Sleep apnea, unspecified: Secondary | ICD-10-CM | POA: Diagnosis not present

## 2019-04-15 DIAGNOSIS — E039 Hypothyroidism, unspecified: Secondary | ICD-10-CM | POA: Diagnosis not present

## 2019-04-15 DIAGNOSIS — E215 Disorder of parathyroid gland, unspecified: Secondary | ICD-10-CM | POA: Diagnosis not present

## 2019-04-15 DIAGNOSIS — K5790 Diverticulosis of intestine, part unspecified, without perforation or abscess without bleeding: Secondary | ICD-10-CM | POA: Diagnosis not present

## 2019-04-15 DIAGNOSIS — Z8701 Personal history of pneumonia (recurrent): Secondary | ICD-10-CM | POA: Diagnosis not present

## 2019-04-15 DIAGNOSIS — G250 Essential tremor: Secondary | ICD-10-CM | POA: Diagnosis not present

## 2019-04-15 DIAGNOSIS — M1990 Unspecified osteoarthritis, unspecified site: Secondary | ICD-10-CM | POA: Diagnosis not present

## 2019-04-15 DIAGNOSIS — M48062 Spinal stenosis, lumbar region with neurogenic claudication: Secondary | ICD-10-CM | POA: Diagnosis not present

## 2019-04-15 DIAGNOSIS — M4316 Spondylolisthesis, lumbar region: Secondary | ICD-10-CM | POA: Diagnosis not present

## 2019-04-15 DIAGNOSIS — Z7984 Long term (current) use of oral hypoglycemic drugs: Secondary | ICD-10-CM | POA: Diagnosis not present

## 2019-04-15 DIAGNOSIS — M5116 Intervertebral disc disorders with radiculopathy, lumbar region: Secondary | ICD-10-CM | POA: Diagnosis not present

## 2019-04-15 DIAGNOSIS — E119 Type 2 diabetes mellitus without complications: Secondary | ICD-10-CM | POA: Diagnosis not present

## 2019-04-15 DIAGNOSIS — M8580 Other specified disorders of bone density and structure, unspecified site: Secondary | ICD-10-CM | POA: Diagnosis not present

## 2019-04-15 DIAGNOSIS — K589 Irritable bowel syndrome without diarrhea: Secondary | ICD-10-CM | POA: Diagnosis not present

## 2019-04-15 DIAGNOSIS — I10 Essential (primary) hypertension: Secondary | ICD-10-CM | POA: Diagnosis not present

## 2019-04-15 DIAGNOSIS — J449 Chronic obstructive pulmonary disease, unspecified: Secondary | ICD-10-CM | POA: Diagnosis not present

## 2019-04-15 DIAGNOSIS — M797 Fibromyalgia: Secondary | ICD-10-CM | POA: Diagnosis not present

## 2019-04-15 DIAGNOSIS — N200 Calculus of kidney: Secondary | ICD-10-CM | POA: Diagnosis not present

## 2019-04-15 DIAGNOSIS — Z7982 Long term (current) use of aspirin: Secondary | ICD-10-CM | POA: Diagnosis not present

## 2019-04-16 DIAGNOSIS — M4316 Spondylolisthesis, lumbar region: Secondary | ICD-10-CM | POA: Diagnosis not present

## 2019-04-17 DIAGNOSIS — Z4789 Encounter for other orthopedic aftercare: Secondary | ICD-10-CM | POA: Diagnosis not present

## 2019-04-17 DIAGNOSIS — M4316 Spondylolisthesis, lumbar region: Secondary | ICD-10-CM | POA: Diagnosis not present

## 2019-04-17 DIAGNOSIS — M48062 Spinal stenosis, lumbar region with neurogenic claudication: Secondary | ICD-10-CM | POA: Diagnosis not present

## 2019-04-17 DIAGNOSIS — I10 Essential (primary) hypertension: Secondary | ICD-10-CM | POA: Diagnosis not present

## 2019-04-17 DIAGNOSIS — E215 Disorder of parathyroid gland, unspecified: Secondary | ICD-10-CM | POA: Diagnosis not present

## 2019-04-17 DIAGNOSIS — M5116 Intervertebral disc disorders with radiculopathy, lumbar region: Secondary | ICD-10-CM | POA: Diagnosis not present

## 2019-04-23 DIAGNOSIS — M4316 Spondylolisthesis, lumbar region: Secondary | ICD-10-CM | POA: Diagnosis not present

## 2019-04-23 DIAGNOSIS — E215 Disorder of parathyroid gland, unspecified: Secondary | ICD-10-CM | POA: Diagnosis not present

## 2019-04-23 DIAGNOSIS — I10 Essential (primary) hypertension: Secondary | ICD-10-CM | POA: Diagnosis not present

## 2019-04-23 DIAGNOSIS — M48062 Spinal stenosis, lumbar region with neurogenic claudication: Secondary | ICD-10-CM | POA: Diagnosis not present

## 2019-04-23 DIAGNOSIS — M5116 Intervertebral disc disorders with radiculopathy, lumbar region: Secondary | ICD-10-CM | POA: Diagnosis not present

## 2019-04-23 DIAGNOSIS — Z4789 Encounter for other orthopedic aftercare: Secondary | ICD-10-CM | POA: Diagnosis not present

## 2019-04-27 DIAGNOSIS — D649 Anemia, unspecified: Secondary | ICD-10-CM | POA: Diagnosis not present

## 2019-04-27 DIAGNOSIS — E1169 Type 2 diabetes mellitus with other specified complication: Secondary | ICD-10-CM | POA: Diagnosis not present

## 2019-04-27 DIAGNOSIS — Z13220 Encounter for screening for lipoid disorders: Secondary | ICD-10-CM | POA: Diagnosis not present

## 2019-04-27 DIAGNOSIS — E785 Hyperlipidemia, unspecified: Secondary | ICD-10-CM | POA: Diagnosis not present

## 2019-04-27 DIAGNOSIS — Z0000 Encounter for general adult medical examination without abnormal findings: Secondary | ICD-10-CM | POA: Diagnosis not present

## 2019-04-30 ENCOUNTER — Ambulatory Visit: Payer: Medicare Other | Admitting: Neurology

## 2019-04-30 NOTE — Progress Notes (Deleted)
PATIENT: Heather Espinoza DOB: 1945/05/29  REASON FOR VISIT: follow up HISTORY FROM: patient  HISTORY OF PRESENT ILLNESS: Today 04/30/19  Heather Espinoza is a 74 year old female with history of chronic low back pain associated with bilateral L4-5 facet joint arthropathy.  She has benefited from facet joint injections, and her last injection the radiologist mentioned the possibility of a radiofrequency ablation procedure.  She has an essential tremor, taking low-dose propanolol.  She was set up for a radiofrequency neurectomy for the chronic low back pain.  Following the procedure, she says she had worsening back pain.  MRI of the lumbar spine was done showing potential for compression of the L5 nerve root with significant facet joint arthritis at L5-S1, which could be the source of her bilateral back and leg pain.  She was sent for a neurosurgical evaluation.  In October she had a L4-5 decompression and fusion.  HISTORY 07/28/2018 Dr. Jannifer Franklin: Heather Espinoza is a 74 year old right-handed white female with a history of chronic low back pain associated with bilateral L4-5 facet joint arthropathy.  The patient has gained benefit previously with facet joint injections transiently, the pain however continues to return.  On her last injection, the radiologist brought up the possibility of a radiofrequency ablation procedure.  The patient was found to have an essential tremor, she was placed on low-dose propranolol, she has done well with this medication taking 20 mg twice daily.  She claims that her blood pressure generally runs around 120/85 and her pulse is around 60.  The patient tolerates the drug well.  She believes that the tremor is under good control.  She has been in the hospital on 08 April 2018 with pneumonia.  She has recovered from this.   REVIEW OF SYSTEMS: Out of a complete 14 system review of symptoms, the patient complains only of the following symptoms, and all other reviewed systems are negative.   ALLERGIES: Allergies  Allergen Reactions  . Flagyl [Metronidazole] Shortness Of Breath and Swelling  . Lyrica [Pregabalin] Shortness Of Breath  . Adhesive [Tape] Other (See Comments)    Takes off patient's skin  . Aspirin Other (See Comments)    Stomach bleed  . Latex Itching and Rash  . Phenergan [Promethazine Hcl] Other (See Comments)    Caused grogginess, altered mental status  . Prednisone Anxiety  . Sulfa Antibiotics Nausea Only  . Tetanus Toxoids Swelling    Arm Area    HOME MEDICATIONS: Outpatient Medications Prior to Visit  Medication Sig Dispense Refill  . ondansetron (ZOFRAN) 4 MG tablet TAKE 1 TABLET BY MOUTH TWICE DAILY AS NEEDED FOR NAUSEA FOR VOMITING 30 tablet 0  . albuterol (PROVENTIL HFA;VENTOLIN HFA) 108 (90 Base) MCG/ACT inhaler Inhale 2 puffs into the lungs every 6 (six) hours as needed for wheezing or shortness of breath.    Marland Kitchen albuterol (PROVENTIL) (2.5 MG/3ML) 0.083% nebulizer solution Take 2.5 mg by nebulization every 6 (six) hours as needed for wheezing or shortness of breath.    Marland Kitchen amLODipine (NORVASC) 5 MG tablet Take 1 tablet (5 mg total) by mouth daily. 90 tablet 3  . aspirin 81 MG chewable tablet Chew 81 mg by mouth daily.    . Biotin 10 MG TABS Take 10 mg by mouth daily.     . bisacodyl (DULCOLAX) 10 MG suppository Place 1 suppository (10 mg total) rectally as needed for moderate constipation. (Patient not taking: Reported on 03/17/2019) 12 suppository 0  . buPROPion (WELLBUTRIN XL) 150 MG 24  hr tablet Take 150 mg by mouth daily.     . cephALEXin (KEFLEX) 500 MG capsule Take 500 mg by mouth 2 (two) times daily.     . cholecalciferol (VITAMIN D) 1000 units tablet Take 1,000 Units by mouth daily.     . Cyanocobalamin (B-12) 2500 MCG SUBL Place 2,500 mcg under the tongue daily.    . cyclobenzaprine (FLEXERIL) 10 MG tablet Take 1 tablet (10 mg total) by mouth 3 (three) times daily as needed for muscle spasms. 50 tablet 0  . dicyclomine (BENTYL) 10 MG  capsule Take 1 capsule (10 mg total) by mouth 3 (three) times daily as needed for spasms. 90 capsule 5  . docusate sodium (COLACE) 100 MG capsule Take 2 capsules (200 mg total) by mouth at bedtime. (Patient taking differently: Take 200 mg by mouth daily. ) 10 capsule 0  . DULoxetine (CYMBALTA) 60 MG capsule Take 1 capsule by mouth twice daily (Patient taking differently: Take 60 mg by mouth 2 (two) times daily. ) 180 capsule 1  . esomeprazole (NEXIUM) 40 MG capsule TAKE 1 CAPSULE BY MOUTH ONCE DAILY BEFORE  BREAKFAST 30 capsule 0  . famotidine (PEPCID) 40 MG tablet TAKE 1 TABLET BY MOUTH AT BEDTIME 30 tablet 3  . Fluticasone-Salmeterol (ADVAIR) 250-50 MCG/DOSE AEPB Inhale 1 puff into the lungs every 12 (twelve) hours.    . gabapentin (NEURONTIN) 400 MG capsule Take 400 mg by mouth 2 (two) times daily.     Marland Kitchen GELATIN PO Take 1 capsule by mouth daily. After a meal    . levothyroxine (SYNTHROID, LEVOTHROID) 75 MCG tablet Take 37.5 mcg by mouth daily before breakfast.     . lisinopril (PRINIVIL,ZESTRIL) 10 MG tablet Take 10 mg by mouth daily.     Marland Kitchen lubiprostone (AMITIZA) 8 MCG capsule Take 2 capsules (16 mcg total) by mouth daily with breakfast. (Patient not taking: Reported on 03/17/2019) 48 capsule 0  . metFORMIN (GLUCOPHAGE-XR) 500 MG 24 hr tablet Take 500 mg by mouth at bedtime.    . mirtazapine (REMERON) 30 MG tablet Take 30 mg by mouth at bedtime.     . propranolol (INDERAL) 20 MG tablet Take 1 tablet (20 mg total) by mouth 2 (two) times daily. 180 tablet 2  . simvastatin (ZOCOR) 20 MG tablet Take 20 mg by mouth every evening.    . traZODone (DESYREL) 100 MG tablet Take 100 mg by mouth at bedtime as needed for sleep.     . vitamin E 400 UNIT capsule Take 400 Units by mouth daily.     No facility-administered medications prior to visit.    PAST MEDICAL HISTORY: Past Medical History:  Diagnosis Date  . Arthritis   . COPD (chronic obstructive pulmonary disease) ([place])   . Diabetes ([place])   .  Diverticulitis   . Diverticulosis   . Dyspnea   . Fibromyalgia   . High cholesterol   . Hypertension   . Hypothyroid   . IBS (irritable bowel syndrome)   . Kidney stone    Left Kidney  . Osteopenia   . Pneumonia   . Restless leg   . Sleep apnea    Doesn't use CPAP.    Marland Kitchen Tachycardia    per pt/fim  . Tremor, essential 01/07/2018    PAST SURGICAL HISTORY: Past Surgical History:  Procedure Laterality Date  . APPENDECTOMY    . BIOPSY  12/25/2016   Procedure: BIOPSY;  Surgeon: Rogene Houston, MD;  Location: [facility];  Service: Endoscopy;;  gastric   . BREAST LUMPECTOMY Right 2001  . CATARACT EXTRACTION Bilateral 2016  . CHOLECYSTECTOMY    . COLONOSCOPY N/A 12/17/2012   Procedure: COLONOSCOPY;  Surgeon: Rogene Houston, MD;  Location: [facility];  Service: Endoscopy;  Laterality: N/A;  215  . complete hysterectomy    . ESOPHAGEAL DILATION N/A 12/25/2016   Procedure: ESOPHAGEAL DILATION;  Surgeon: Rogene Houston, MD;  [facility]: [facility];  Service: Endoscopy;  Laterality: N/A;  . ESOPHAGOGASTRODUODENOSCOPY N/A 12/25/2016   Procedure: ESOPHAGOGASTRODUODENOSCOPY (EGD);  Surgeon: Rogene Houston, MD;  [facility];  Service: Endoscopy;  Laterality: N/A;  730  . Heel tumor removed    . rt elbow surgery    . TONSILLECTOMY      FAMILY HISTORY: Family History  Problem Relation Age of Onset  . Uterine cancer Mother   . Parkinson's disease Father   . Stroke Father   . Colon cancer Brother   . Lung cancer Brother   . COPD Sister   . Diabetes Grandchild   . Asthma Son     SOCIAL HISTORY: Social History   Socioeconomic History  . Marital status: Married    Spouse name: Not on file  . Number of children: 2  . Years of education: College  . Highest education level: Not on file  Occupational History  . Occupation: Retired  Tobacco Use  . Smoking status: Never Smoker  . Smokeless tobacco: Never Used  Substance and Sexual Activity  . Alcohol use:  No    Alcohol/week: 0.0 standard drinks  . Drug use: No  . Sexual activity: Not on file  Other Topics Concern  . Not on file  Social History Narrative   Lives at home w/ her husband   Right-handed   Occasional caffeine: decaf tea, diet sodas   Social Determinants of Health   Financial Resource Strain:   . Difficulty of Paying Living Expenses: Not on file  Food Insecurity:   . Worried About Charity fundraiser in the Last Year: Not on file  . Ran Out of Food in the Last Year: Not on file  Transportation Needs:   . Lack of Transportation (Medical): Not on file  . Lack of Transportation (Non-Medical): Not on file  Physical Activity:   . Days of Exercise per Week: Not on file  . Minutes of Exercise per Session: Not on file  Stress:   . Feeling of Stress : Not on file  Social Connections:   . Frequency of Communication with Friends and Family: Not on file  . Frequency of Social Gatherings with Friends and Family: Not on file  . Attends Religious Services: Not on file  . Active Member of Clubs or Organizations: Not on file  . Attends Archivist Meetings: Not on file  . Marital Status: Not on file  Intimate Partner Violence:   . Fear of Current or Ex-Partner: Not on file  . Emotionally Abused: Not on file  . Physically Abused: Not on file  . Sexually Abused: Not on file      PHYSICAL EXAM  There were no vitals filed for this visit. There is no height or weight on file to calculate BMI.  Generalized: Well developed, in no acute distress   Neurological examination  Mentation: Alert oriented to time, place, history taking. Follows all commands speech and language fluent Cranial nerve II-XII: Pupils were equal round reactive to light. Extraocular movements were full, visual  field were full on confrontational test. Facial sensation and strength were normal. Uvula tongue midline. Head turning and shoulder shrug  were normal and symmetric. Motor: The motor testing  reveals 5 over 5 strength of all 4 extremities. Good symmetric motor tone is noted throughout.  Sensory: Sensory testing is intact to soft touch on all 4 extremities. No evidence of extinction is noted.  Coordination: Cerebellar testing reveals good finger-nose-finger and heel-to-shin bilaterally.  Gait and station: Gait is normal. Tandem gait is normal. Romberg is negative. No drift is seen.  Reflexes: Deep tendon reflexes are symmetric and normal bilaterally.   DIAGNOSTIC DATA (LABS, IMAGING, TESTING) - I reviewed patient records, labs, notes, testing and imaging myself where available.  Lab Results  Component Value Date   WBC 10.4 02/05/2019   HGB 8.6 (L) 02/05/2019   HCT 27.1 (L) 02/05/2019   MCV 93.4 02/05/2019   PLT 374 02/05/2019      Component Value Date/Time   NA 140 02/05/2019 1008   K 3.3 (L) 02/05/2019 1008   CL 110 02/05/2019 1008   CO2 22 02/05/2019 1008   GLUCOSE 117 (H) 02/05/2019 1008   BUN 8 02/05/2019 1008   CREATININE 1.36 (H) 02/05/2019 1008   CALCIUM 8.3 (L) 02/05/2019 1008   PROT 5.5 (L) 02/03/2019 1133   PROT 6.6 05/09/2018 1511   ALBUMIN 2.3 (L) 02/03/2019 1133   ALBUMIN 4.2 05/09/2018 1511   AST 12 (L) 02/03/2019 1133   ALT 9 02/03/2019 1133   ALKPHOS 79 02/03/2019 1133   BILITOT 0.7 02/03/2019 1133   BILITOT 0.3 05/09/2018 1511   GFRNONAA 39 (L) 02/05/2019 1008   GFRAA 45 (L) 02/05/2019 1008   Lab Results  Component Value Date   CHOL 129 05/09/2018   HDL 53 05/09/2018   LDLCALC 49 05/09/2018   TRIG 134 05/09/2018   CHOLHDL 2.4 05/09/2018   Lab Results  Component Value Date   HGBA1C 6.0 (H) 02/04/2019   Lab Results  Component Value Date   [id number] 02/04/2019   Lab Results  Component Value Date   TSH 1.340 05/09/2018      ASSESSMENT AND PLAN 74 y.o. year old female  has a past medical history of Arthritis, COPD (chronic obstructive pulmonary disease) ([place]), Diabetes ([place]), Diverticulitis, Diverticulosis, Dyspnea,  Fibromyalgia, High cholesterol, Hypertension, Hypothyroid, IBS (irritable bowel syndrome), Kidney stone, Osteopenia, Pneumonia, Restless leg, Sleep apnea, Tachycardia, and Tremor, essential (01/07/2018). here with ***   I spent 15 minutes with the patient. 50% of this time was spent   Butler Denmark, Homer, DNP 04/30/2019, 5:45 AM [facility] [address] [place], [place] [address] [phone]

## 2019-05-02 DIAGNOSIS — Z4789 Encounter for other orthopedic aftercare: Secondary | ICD-10-CM | POA: Diagnosis not present

## 2019-05-02 DIAGNOSIS — E215 Disorder of parathyroid gland, unspecified: Secondary | ICD-10-CM | POA: Diagnosis not present

## 2019-05-02 DIAGNOSIS — I10 Essential (primary) hypertension: Secondary | ICD-10-CM | POA: Diagnosis not present

## 2019-05-02 DIAGNOSIS — M4316 Spondylolisthesis, lumbar region: Secondary | ICD-10-CM | POA: Diagnosis not present

## 2019-05-02 DIAGNOSIS — M5116 Intervertebral disc disorders with radiculopathy, lumbar region: Secondary | ICD-10-CM | POA: Diagnosis not present

## 2019-05-02 DIAGNOSIS — M48062 Spinal stenosis, lumbar region with neurogenic claudication: Secondary | ICD-10-CM | POA: Diagnosis not present

## 2019-05-05 ENCOUNTER — Other Ambulatory Visit (INDEPENDENT_AMBULATORY_CARE_PROVIDER_SITE_OTHER): Payer: Self-pay | Admitting: Gastroenterology

## 2019-05-12 DIAGNOSIS — Z23 Encounter for immunization: Secondary | ICD-10-CM | POA: Diagnosis not present

## 2019-05-13 ENCOUNTER — Other Ambulatory Visit (INDEPENDENT_AMBULATORY_CARE_PROVIDER_SITE_OTHER): Payer: Self-pay | Admitting: Gastroenterology

## 2019-05-13 DIAGNOSIS — M4316 Spondylolisthesis, lumbar region: Secondary | ICD-10-CM | POA: Diagnosis not present

## 2019-05-13 DIAGNOSIS — M5116 Intervertebral disc disorders with radiculopathy, lumbar region: Secondary | ICD-10-CM | POA: Diagnosis not present

## 2019-05-13 DIAGNOSIS — M48062 Spinal stenosis, lumbar region with neurogenic claudication: Secondary | ICD-10-CM | POA: Diagnosis not present

## 2019-05-13 DIAGNOSIS — I10 Essential (primary) hypertension: Secondary | ICD-10-CM | POA: Diagnosis not present

## 2019-05-13 DIAGNOSIS — E215 Disorder of parathyroid gland, unspecified: Secondary | ICD-10-CM | POA: Diagnosis not present

## 2019-05-13 DIAGNOSIS — Z4789 Encounter for other orthopedic aftercare: Secondary | ICD-10-CM | POA: Diagnosis not present

## 2019-05-20 ENCOUNTER — Ambulatory Visit (INDEPENDENT_AMBULATORY_CARE_PROVIDER_SITE_OTHER): Payer: Medicare Other | Admitting: Pulmonary Disease

## 2019-05-20 ENCOUNTER — Other Ambulatory Visit: Payer: Self-pay

## 2019-05-20 ENCOUNTER — Encounter: Payer: Self-pay | Admitting: Pulmonary Disease

## 2019-05-20 ENCOUNTER — Ambulatory Visit (INDEPENDENT_AMBULATORY_CARE_PROVIDER_SITE_OTHER): Payer: Medicare Other

## 2019-05-20 VITALS — BP 120/60 | HR 56 | Ht 61.0 in | Wt 124.0 lb

## 2019-05-20 DIAGNOSIS — R0602 Shortness of breath: Secondary | ICD-10-CM

## 2019-05-20 DIAGNOSIS — Z8349 Family history of other endocrine, nutritional and metabolic diseases: Secondary | ICD-10-CM | POA: Diagnosis not present

## 2019-05-20 DIAGNOSIS — Z8701 Personal history of pneumonia (recurrent): Secondary | ICD-10-CM

## 2019-05-20 LAB — COMPREHENSIVE METABOLIC PANEL
ALT: 18 U/L (ref 0–35)
AST: 20 U/L (ref 0–37)
Albumin: 3.9 g/dL (ref 3.5–5.2)
Alkaline Phosphatase: 50 U/L (ref 39–117)
BUN: 17 mg/dL (ref 6–23)
CO2: 27 mEq/L (ref 19–32)
Calcium: 9.7 mg/dL (ref 8.4–10.5)
Chloride: 104 mEq/L (ref 96–112)
Creatinine, Ser: 1.17 mg/dL (ref 0.40–1.20)
GFR: 45.53 mL/min — ABNORMAL LOW (ref >60.00)
Glucose, Bld: 103 mg/dL — ABNORMAL HIGH (ref 70–99)
Potassium: 4.1 mEq/L (ref 3.5–5.1)
Sodium: 138 mEq/L (ref 135–145)
Total Bilirubin: 0.3 mg/dL (ref 0.2–1.2)
Total Protein: 6.9 g/dL (ref 6.0–8.3)

## 2019-05-20 LAB — CBC WITH DIFFERENTIAL/PLATELET
Basophils Absolute: 0.1 10*3/uL (ref 0.0–0.1)
Basophils Relative: 1.3 % (ref 0.0–3.0)
Eosinophils Absolute: 0.5 10*3/uL (ref 0.0–0.7)
Eosinophils Relative: 6.3 % — ABNORMAL HIGH (ref 0.0–5.0)
HCT: 32.2 % — ABNORMAL LOW (ref 36.0–46.0)
Hemoglobin: 10.9 g/dL — ABNORMAL LOW (ref 12.0–15.0)
Lymphocytes Relative: 31.6 % (ref 12.0–46.0)
Lymphs Abs: 2.4 10*3/uL (ref 0.7–4.0)
MCHC: 33.7 g/dL (ref 30.0–36.0)
MCV: 90.1 fl (ref 78.0–100.0)
Monocytes Absolute: 0.8 10*3/uL (ref 0.1–1.0)
Monocytes Relative: 10.8 % (ref 3.0–12.0)
Neutro Abs: 3.8 10*3/uL (ref 1.4–7.7)
Neutrophils Relative %: 50 % (ref 43.0–77.0)
Platelets: 300 10*3/uL (ref 150.0–400.0)
RBC: 3.58 Mil/uL — ABNORMAL LOW (ref 3.87–5.11)
RDW: 14.4 % (ref 11.5–15.5)
WBC: 7.7 10*3/uL (ref 4.0–10.5)

## 2019-05-20 NOTE — Addendum Note (Signed)
Addended by: Desmond Dike C on: 05/20/2019 02:51 PM   Modules accepted: Orders

## 2019-05-20 NOTE — Addendum Note (Signed)
Addended by: Suzzanne Cloud E on: 05/20/2019 02:54 PM   Modules accepted: Orders

## 2019-05-20 NOTE — Addendum Note (Signed)
Addended by: Suzzanne Cloud E on: 05/20/2019 03:10 PM   Modules accepted: Orders

## 2019-05-20 NOTE — Addendum Note (Signed)
Addended by: Suzzanne Cloud E on: 05/20/2019 02:25 PM   Modules accepted: Orders

## 2019-05-20 NOTE — Progress Notes (Signed)
Synopsis: Referred in March 2021 for shortness of breath by London Pepper, MD  Subjective:   PATIENT ID: Heather Espinoza GENDER: female DOB: 1945-06-01, MRN: [id number]  Chief Complaint  Patient presents with  . Consult    Patient is here for shortness of breath with exertion and daily activites. Patient states she has COPD. Patient states she doesn't have a lot of energy to do things and when she does it takes her awhile to complete task.    This is a 74 year old female past medical history labeled with COPD no prior pulmonary function test on file for review, history of diabetes, hypertension sleep apnea and not use of CPAP.  Referred for evaluation of shortness of breath.  OV 05/20/2019: Today, complains of shortness of breath with exertion.  Per history she has a family history of alpha-1 antitrypsin.  She was told as a child that she may have this disease.  She was labeled with COPD and documentation but no prior pulmonary function test.  CT imaging with no real significant evidence of emphysema.  She is lifelong non-smoker.  But she states she was told this nearly 50 years ago.  Currently on no inhalers.  She does state that she has feeling more fatigued than usual.  She has had approximately 25 pound weight loss.  Patient denies hemoptysis or blood loss in stool.  She does have prior lab work with anemia hemoglobin of 8.6.  But these last labs were in November 2020.   Past Medical History:  Diagnosis Date  . Arthritis   . COPD (chronic obstructive pulmonary disease) ([place])   . Diabetes ([place])   . Diverticulitis   . Diverticulosis   . Dyspnea   . Fibromyalgia   . High cholesterol   . Hypertension   . Hypothyroid   . IBS (irritable bowel syndrome)   . Kidney stone    Left Kidney  . Osteopenia   . Pneumonia   . Restless leg   . Sleep apnea    Doesn't use CPAP.    Marland Kitchen Tachycardia    per pt/fim  . Tremor, essential 01/07/2018     Family History  Problem Relation Age of Onset    . Uterine cancer Mother   . Parkinson's disease Father   . Stroke Father   . Colon cancer Brother   . Lung cancer Brother   . COPD Sister   . Diabetes Grandchild   . Asthma Son      Past Surgical History:  Procedure Laterality Date  . APPENDECTOMY    . BIOPSY  12/25/2016   Procedure: BIOPSY;  Surgeon: Rogene Houston, MD;  [facility]: [facility];  Service: Endoscopy;;  gastric   . BREAST LUMPECTOMY Right 2001  . CATARACT EXTRACTION Bilateral 2016  . CHOLECYSTECTOMY    . COLONOSCOPY N/A 12/17/2012   Procedure: COLONOSCOPY;  Surgeon: Rogene Houston, MD;  Location: [facility];  Service: Endoscopy;  Laterality: N/A;  215  . complete hysterectomy    . ESOPHAGEAL DILATION N/A 12/25/2016   Procedure: ESOPHAGEAL DILATION;  Surgeon: Rogene Houston, MD;  [facility];  Service: Endoscopy;  Laterality: N/A;  . ESOPHAGOGASTRODUODENOSCOPY N/A 12/25/2016   Procedure: ESOPHAGOGASTRODUODENOSCOPY (EGD);  Surgeon: Rogene Houston, MD;  [facility];  Service: Endoscopy;  Laterality: N/A;  730  . Heel tumor removed    . rt elbow surgery    . TONSILLECTOMY      Social History   Socioeconomic  History  . Marital status: Married    Spouse name: Not on file  . Number of children: 2  . Years of education: College  . Highest education level: Not on file  Occupational History  . Occupation: Retired  Tobacco Use  . Smoking status: Never Smoker  . Smokeless tobacco: Never Used  Substance and Sexual Activity  . Alcohol use: No    Alcohol/week: 0.0 standard drinks  . Drug use: No  . Sexual activity: Not on file  Other Topics Concern  . Not on file  Social History Narrative   Lives at home w/ her husband   Right-handed   Occasional caffeine: decaf tea, diet sodas   Social Determinants of Health   Financial Resource Strain:   . Difficulty of Paying Living Expenses: Not on file  Food Insecurity:   . Worried About Charity fundraiser in the Last Year: Not on  file  . Ran Out of Food in the Last Year: Not on file  Transportation Needs:   . Lack of Transportation (Medical): Not on file  . Lack of Transportation (Non-Medical): Not on file  Physical Activity:   . Days of Exercise per Week: Not on file  . Minutes of Exercise per Session: Not on file  Stress:   . Feeling of Stress : Not on file  Social Connections:   . Frequency of Communication with Friends and Family: Not on file  . Frequency of Social Gatherings with Friends and Family: Not on file  . Attends Religious Services: Not on file  . Active Member of Clubs or Organizations: Not on file  . Attends Archivist Meetings: Not on file  . Marital Status: Not on file  Intimate Partner Violence:   . Fear of Current or Ex-Partner: Not on file  . Emotionally Abused: Not on file  . Physically Abused: Not on file  . Sexually Abused: Not on file     Allergies  Allergen Reactions  . Flagyl [Metronidazole] Shortness Of Breath and Swelling  . Lyrica [Pregabalin] Shortness Of Breath  . Adhesive [Tape] Other (See Comments)    Takes off patient's skin  . Aspirin Other (See Comments)    Stomach bleed  . Latex Itching and Rash  . Phenergan [Promethazine Hcl] Other (See Comments)    Caused grogginess, altered mental status  . Prednisone Anxiety  . Sulfa Antibiotics Nausea Only  . Tetanus Toxoids Swelling    Arm Area     Outpatient Medications Prior to Visit  Medication Sig Dispense Refill  . esomeprazole (NEXIUM) 40 MG capsule TAKE 1 CAPSULE BY MOUTH ONCE DAILY BEFORE BREAKFAST 30 capsule 0  . albuterol (PROVENTIL HFA;VENTOLIN HFA) 108 (90 Base) MCG/ACT inhaler Inhale 2 puffs into the lungs every 6 (six) hours as needed for wheezing or shortness of breath.    Marland Kitchen albuterol (PROVENTIL) (2.5 MG/3ML) 0.083% nebulizer solution Take 2.5 mg by nebulization every 6 (six) hours as needed for wheezing or shortness of breath.    Marland Kitchen amLODipine (NORVASC) 5 MG tablet Take 1 tablet (5 mg total) by  mouth daily. 90 tablet 3  . aspirin 81 MG chewable tablet Chew 81 mg by mouth daily.    . Biotin 10 MG TABS Take 10 mg by mouth daily.     . bisacodyl (DULCOLAX) 10 MG suppository Place 1 suppository (10 mg total) rectally as needed for moderate constipation. (Patient not taking: Reported on 03/17/2019) 12 suppository 0  . buPROPion (WELLBUTRIN XL) 150 MG 24  hr tablet Take 150 mg by mouth daily.     . cephALEXin (KEFLEX) 500 MG capsule Take 500 mg by mouth 2 (two) times daily.     . cholecalciferol (VITAMIN D) 1000 units tablet Take 1,000 Units by mouth daily.     . Cyanocobalamin (B-12) 2500 MCG SUBL Place 2,500 mcg under the tongue daily.    . cyclobenzaprine (FLEXERIL) 10 MG tablet Take 1 tablet (10 mg total) by mouth 3 (three) times daily as needed for muscle spasms. 50 tablet 0  . dicyclomine (BENTYL) 10 MG capsule Take 1 capsule (10 mg total) by mouth 3 (three) times daily as needed for spasms. 90 capsule 5  . docusate sodium (COLACE) 100 MG capsule Take 2 capsules (200 mg total) by mouth at bedtime. (Patient taking differently: Take 200 mg by mouth daily. ) 10 capsule 0  . DULoxetine (CYMBALTA) 60 MG capsule Take 1 capsule by mouth twice daily (Patient taking differently: Take 60 mg by mouth 2 (two) times daily. ) 180 capsule 1  . famotidine (PEPCID) 40 MG tablet TAKE 1 TABLET BY MOUTH AT BEDTIME 30 tablet 3  . Fluticasone-Salmeterol (ADVAIR) 250-50 MCG/DOSE AEPB Inhale 1 puff into the lungs every 12 (twelve) hours.    . gabapentin (NEURONTIN) 400 MG capsule Take 400 mg by mouth 2 (two) times daily.     Marland Kitchen GELATIN PO Take 1 capsule by mouth daily. After a meal    . levothyroxine (SYNTHROID, LEVOTHROID) 75 MCG tablet Take 37.5 mcg by mouth daily before breakfast.     . lisinopril (PRINIVIL,ZESTRIL) 10 MG tablet Take 10 mg by mouth daily.     Marland Kitchen lubiprostone (AMITIZA) 8 MCG capsule Take 2 capsules (16 mcg total) by mouth daily with breakfast. (Patient not taking: Reported on 03/17/2019) 48  capsule 0  . metFORMIN (GLUCOPHAGE-XR) 500 MG 24 hr tablet Take 500 mg by mouth at bedtime.    . mirtazapine (REMERON) 30 MG tablet Take 30 mg by mouth at bedtime.     . ondansetron (ZOFRAN) 4 MG tablet TAKE 1 TABLET BY MOUTH TWICE DAILY AS NEEDED FOR NAUSEA FOR VOMITING 30 tablet 0  . propranolol (INDERAL) 20 MG tablet Take 1 tablet (20 mg total) by mouth 2 (two) times daily. 180 tablet 2  . simvastatin (ZOCOR) 20 MG tablet Take 20 mg by mouth every evening.    . traZODone (DESYREL) 100 MG tablet Take 100 mg by mouth at bedtime as needed for sleep.     . vitamin E 400 UNIT capsule Take 400 Units by mouth daily.     No facility-administered medications prior to visit.    Review of Systems  Constitutional: Positive for malaise/fatigue and weight loss. Negative for chills and fever.       Loss of appetite  HENT: Negative for hearing loss, sore throat and tinnitus.   Eyes: Negative for blurred vision and double vision.  Respiratory: Positive for shortness of breath. Negative for cough, hemoptysis, sputum production, wheezing and stridor.   Cardiovascular: Negative for chest pain, palpitations, orthopnea, leg swelling and PND.  Gastrointestinal: Negative for abdominal pain, constipation, diarrhea, heartburn, nausea and vomiting.  Genitourinary: Negative for dysuria, hematuria and urgency.  Musculoskeletal: Negative for joint pain and myalgias.  Skin: Negative for itching and rash.  Neurological: Negative for dizziness, tingling, weakness and headaches.  Endo/Heme/Allergies: Negative for environmental allergies. Does not bruise/bleed easily.  Psychiatric/Behavioral: Negative for depression. The patient is not nervous/anxious and does not have insomnia.   All other  systems reviewed and are negative.    Objective:  Physical Exam Vitals reviewed.  Constitutional:      General: She is not in acute distress.    Appearance: She is well-developed.  HENT:     Head: Normocephalic and  atraumatic.  Eyes:     General: No scleral icterus.    Conjunctiva/sclera: Conjunctivae normal.     Pupils: Pupils are equal, round, and reactive to light.  Neck:     Vascular: No JVD.     Trachea: No tracheal deviation.  Cardiovascular:     Rate and Rhythm: Normal rate and regular rhythm.     Heart sounds: Normal heart sounds. No murmur.  Pulmonary:     Effort: Pulmonary effort is normal. No tachypnea, accessory muscle usage or respiratory distress.     Breath sounds: Normal breath sounds. No stridor. No wheezing, rhonchi or rales.  Musculoskeletal:        General: No tenderness.     Cervical back: Neck supple.  Lymphadenopathy:     Cervical: No cervical adenopathy.  Skin:    General: Skin is warm and dry.     Capillary Refill: Capillary refill takes less than 2 seconds.     Findings: No rash.  Neurological:     Mental Status: She is alert and oriented to person, place, and time.  Psychiatric:        Behavior: Behavior normal.      Vitals:   05/20/19 1335  BP: 120/60  Pulse: (!) 56  SpO2: 98%  Weight: 124 lb (56.2 kg)  Height: 5\' 1"  (1.549 m)   98% on RA BMI Readings from Last 3 Encounters:  05/20/19 23.43 kg/m  03/17/19 23.61 kg/m  02/03/19 26.02 kg/m   Wt Readings from Last 3 Encounters:  05/20/19 124 lb (56.2 kg)  03/17/19 127 lb (57.6 kg)  02/03/19 140 lb (63.5 kg)     CBC    Component Value Date/Time   WBC 10.4 02/05/2019 1008   RBC 2.90 (L) 02/05/2019 1008   HGB 8.6 (L) 02/05/2019 1008   HCT 27.1 (L) 02/05/2019 1008   PLT 374 02/05/2019 1008   MCV 93.4 02/05/2019 1008   MCH 29.7 02/05/2019 1008   MCHC 31.7 02/05/2019 1008   RDW 13.7 02/05/2019 1008   LYMPHSABS 2.0 02/05/2019 1008   MONOABS 1.3 (H) 02/05/2019 1008   EOSABS 1.0 (H) 02/05/2019 1008   BASOSABS 0.1 02/05/2019 1008    Chest Imaging: CT chest April 09, 2018: Evidence of bilateral consolidation concerning for pneumonia, small pleural effusions.  Nodular-like  infiltrates. The patient's images have been independently reviewed by me.    Pulmonary Functions Testing Results: No flowsheet data found.  FeNO: noen   Pathology: none   Echocardiogram: 2019 echocardiogram normal ejection fraction, mildly elevated RV systolic pressures.  Heart Catheterization: none     Assessment & Plan:     ICD-10-CM   1. SOB (shortness of breath)  R06.02 DG Chest 2 View    Pulmonary Function Test    Alpha-1 antitrypsin phenotype    Alpha-1-Antitrypsin Deficiency    COMPLETE METABOLIC PANEL WITH GFR    CBC w/Diff  2. History of pneumonia  Z87.01   3. Family history of alpha 1 antitrypsin deficiency  Z83.49     Discussion: This is a 74 year old female complaints of dyspnea on exertion shortness of breath.  Has a history of pneumonia in January 2020.  She was hospitalized at this time.  CT imaging reviewed.  Please  see above.  Family history of alpha-1 antitrypsin deficiency.  She was told that she may have this disease but this was nearly 50 years ago.  She is a lifelong non-smoker.  Plan Following Extensive Data Review & Interpretation:  . I reviewed prior external note(s) from 03/17/2019 gastroenterology office note, chronic GERD, esophageal dysphagia.  02/03/2019 emergency room visit and hospital admission, discharge summary Dr. Pietro Cassis reviewed. . I reviewed the result(s) of 2019 echocardiogram.  Most recent lab work November 2020, hemoglobin 8.6 . I have ordered full pulmonary function test, 2 view chest x-ray.  If chest x-ray shows any persistent abnormalities or changes would likely benefit from repeat noncontrasted CT of the chest.  Chest x-ray from today reviewed lower lobe infiltrate resolved in comparison to 2020 imaging. The patient's images have been independently reviewed by me.    Independent interpretation of tests . Review of patient's January 2020 CT chest images revealed bilateral infiltrates concerning for pneumonia, some nodular areas, also has  lower lobe consolidation/atelectasis. The patient's images have been independently reviewed by me.    Return to clinic in 6 weeks following pulmonary function tests and lab work completion.    Current Outpatient Medications:  .  esomeprazole (NEXIUM) 40 MG capsule, TAKE 1 CAPSULE BY MOUTH ONCE DAILY BEFORE BREAKFAST, Disp: 30 capsule, Rfl: 0 .  albuterol (PROVENTIL HFA;VENTOLIN HFA) 108 (90 Base) MCG/ACT inhaler, Inhale 2 puffs into the lungs every 6 (six) hours as needed for wheezing or shortness of breath., Disp: , Rfl:  .  albuterol (PROVENTIL) (2.5 MG/3ML) 0.083% nebulizer solution, Take 2.5 mg by nebulization every 6 (six) hours as needed for wheezing or shortness of breath., Disp: , Rfl:  .  amLODipine (NORVASC) 5 MG tablet, Take 1 tablet (5 mg total) by mouth daily., Disp: 90 tablet, Rfl: 3 .  aspirin 81 MG chewable tablet, Chew 81 mg by mouth daily., Disp: , Rfl:  .  Biotin 10 MG TABS, Take 10 mg by mouth daily. , Disp: , Rfl:  .  bisacodyl (DULCOLAX) 10 MG suppository, Place 1 suppository (10 mg total) rectally as needed for moderate constipation. (Patient not taking: Reported on 03/17/2019), Disp: 12 suppository, Rfl: 0 .  buPROPion (WELLBUTRIN XL) 150 MG 24 hr tablet, Take 150 mg by mouth daily. , Disp: , Rfl:  .  cephALEXin (KEFLEX) 500 MG capsule, Take 500 mg by mouth 2 (two) times daily. , Disp: , Rfl:  .  cholecalciferol (VITAMIN D) 1000 units tablet, Take 1,000 Units by mouth daily. , Disp: , Rfl:  .  Cyanocobalamin (B-12) 2500 MCG SUBL, Place 2,500 mcg under the tongue daily., Disp: , Rfl:  .  cyclobenzaprine (FLEXERIL) 10 MG tablet, Take 1 tablet (10 mg total) by mouth 3 (three) times daily as needed for muscle spasms., Disp: 50 tablet, Rfl: 0 .  dicyclomine (BENTYL) 10 MG capsule, Take 1 capsule (10 mg total) by mouth 3 (three) times daily as needed for spasms., Disp: 90 capsule, Rfl: 5 .  docusate sodium (COLACE) 100 MG capsule, Take 2 capsules (200 mg total) by mouth at  bedtime. (Patient taking differently: Take 200 mg by mouth daily. ), Disp: 10 capsule, Rfl: 0 .  DULoxetine (CYMBALTA) 60 MG capsule, Take 1 capsule by mouth twice daily (Patient taking differently: Take 60 mg by mouth 2 (two) times daily. ), Disp: 180 capsule, Rfl: 1 .  famotidine (PEPCID) 40 MG tablet, TAKE 1 TABLET BY MOUTH AT BEDTIME, Disp: 30 tablet, Rfl: 3 .  Fluticasone-Salmeterol (ADVAIR)  250-50 MCG/DOSE AEPB, Inhale 1 puff into the lungs every 12 (twelve) hours., Disp: , Rfl:  .  gabapentin (NEURONTIN) 400 MG capsule, Take 400 mg by mouth 2 (two) times daily. , Disp: , Rfl:  .  GELATIN PO, Take 1 capsule by mouth daily. After a meal, Disp: , Rfl:  .  levothyroxine (SYNTHROID, LEVOTHROID) 75 MCG tablet, Take 37.5 mcg by mouth daily before breakfast. , Disp: , Rfl:  .  lisinopril (PRINIVIL,ZESTRIL) 10 MG tablet, Take 10 mg by mouth daily. , Disp: , Rfl:  .  lubiprostone (AMITIZA) 8 MCG capsule, Take 2 capsules (16 mcg total) by mouth daily with breakfast. (Patient not taking: Reported on 03/17/2019), Disp: 48 capsule, Rfl: 0 .  metFORMIN (GLUCOPHAGE-XR) 500 MG 24 hr tablet, Take 500 mg by mouth at bedtime., Disp: , Rfl:  .  mirtazapine (REMERON) 30 MG tablet, Take 30 mg by mouth at bedtime. , Disp: , Rfl:  .  ondansetron (ZOFRAN) 4 MG tablet, TAKE 1 TABLET BY MOUTH TWICE DAILY AS NEEDED FOR NAUSEA FOR VOMITING, Disp: 30 tablet, Rfl: 0 .  propranolol (INDERAL) 20 MG tablet, Take 1 tablet (20 mg total) by mouth 2 (two) times daily., Disp: 180 tablet, Rfl: 2 .  simvastatin (ZOCOR) 20 MG tablet, Take 20 mg by mouth every evening., Disp: , Rfl:  .  traZODone (DESYREL) 100 MG tablet, Take 100 mg by mouth at bedtime as needed for sleep. , Disp: , Rfl:  .  vitamin E 400 UNIT capsule, Take 400 Units by mouth daily., Disp: , Rfl:    Garner Nash, DO [facility] 05/20/2019 1:53 PM

## 2019-05-20 NOTE — Addendum Note (Signed)
Addended by: Lia Foyer R on: 05/20/2019 02:36 PM   Modules accepted: Orders

## 2019-05-20 NOTE — Patient Instructions (Addendum)
Thank you for visiting Dr. Valeta Harms at [facility]. Today we recommend the following:  Orders Placed This Encounter  Procedures  . DG Chest 2 View  . Alpha-1 antitrypsin phenotype  . Alpha-1-Antitrypsin Deficiency  . COMPLETE METABOLIC PANEL WITH GFR  . CBC w/Diff  . Pulmonary Function Test    Return in about 6 weeks (around 07/01/2019) for with APP or Dr. Valeta Harms.    Please do your part to reduce the spread of COVID-19.

## 2019-05-20 NOTE — Addendum Note (Signed)
Addended by: Lia Foyer R on: 05/20/2019 03:01 PM   Modules accepted: Orders

## 2019-05-26 DIAGNOSIS — Z23 Encounter for immunization: Secondary | ICD-10-CM | POA: Diagnosis not present

## 2019-05-30 LAB — ALPHA-1 ANTITRYPSIN PHENOTYPE: A-1 Antitrypsin, Ser: 87 mg/dL (ref 83–199)

## 2019-06-03 ENCOUNTER — Other Ambulatory Visit: Payer: Self-pay | Admitting: Neurology

## 2019-06-12 ENCOUNTER — Other Ambulatory Visit (INDEPENDENT_AMBULATORY_CARE_PROVIDER_SITE_OTHER): Payer: Self-pay | Admitting: Gastroenterology

## 2019-06-17 ENCOUNTER — Ambulatory Visit: Payer: Medicare Other | Admitting: Neurology

## 2019-06-30 ENCOUNTER — Ambulatory Visit (INDEPENDENT_AMBULATORY_CARE_PROVIDER_SITE_OTHER): Payer: Medicare Other | Admitting: Internal Medicine

## 2019-06-30 ENCOUNTER — Other Ambulatory Visit: Payer: Self-pay

## 2019-06-30 ENCOUNTER — Encounter (INDEPENDENT_AMBULATORY_CARE_PROVIDER_SITE_OTHER): Payer: Self-pay | Admitting: Internal Medicine

## 2019-06-30 VITALS — BP 108/66 | HR 65 | Temp 97.3°F | Ht 61.5 in | Wt 124.9 lb

## 2019-06-30 DIAGNOSIS — K5900 Constipation, unspecified: Secondary | ICD-10-CM | POA: Insufficient documentation

## 2019-06-30 DIAGNOSIS — K219 Gastro-esophageal reflux disease without esophagitis: Secondary | ICD-10-CM | POA: Diagnosis not present

## 2019-06-30 MED ORDER — POLYETHYLENE GLYCOL 3350 17 GM/SCOOP PO POWD: 8.5000 g | Freq: Every day | ORAL | 5 refills | Status: DC

## 2019-06-30 NOTE — Patient Instructions (Signed)
Esophagogastroduodenoscopy with esophageal dilation and high risk screening colonoscopy to be scheduled in near future.  She will need to be off aspirin for 2 days

## 2019-06-30 NOTE — Progress Notes (Signed)
Presenting complaint;  Follow-up for chronic GERD IBS and nausea.  Database and subjective:  Patient is 74 year old Caucasian female who has history of GERD nausea as well as IBS and dysphagia who is here for scheduled visit.  Her last visit was virtual on 03/17/2019. Patient says she had lumbar spine surgery in October 2020 and few days later she developed acute parotitis and was hospitalized at [facility] and was on antibiotics for a week or so.  She feels she has fully recovered from this infection.  She says her back feels better but it has not completely healed she is still having pain. Patient says has first GERD symptoms are concerned Nexium is working and she rarely has heartburn.  Now she complains of dysphagia to solids.  She is having dysphagia multiple times a week but not daily.  She has difficulty with bread chips meat and pills.  She points to suprasternal area site of bolus obstruction.  She had one episode of food impaction in January this year relieved with spontaneous regurgitation.  She says her bowels move daily most of her stools are hard.  She is using Bentyl occasionally no more than couple of doses a month.  She denies melena or rectal bleeding.  Her appetite is fair.  She is increasing her physical activity.  She walks on treadmill for 10 minutes daily.  She is following her neurosurgeons recommendations.  Current Medications: Outpatient Encounter Medications as of 06/30/2019  Medication Sig  . albuterol (PROVENTIL HFA;VENTOLIN HFA) 108 (90 Base) MCG/ACT inhaler Inhale 2 puffs into the lungs every 6 (six) hours as needed for wheezing or shortness of breath.  Marland Kitchen amLODipine (NORVASC) 5 MG tablet Take 1 tablet (5 mg total) by mouth daily.  Marland Kitchen aspirin 81 MG chewable tablet Chew 81 mg by mouth daily.  . bisacodyl (DULCOLAX) 10 MG suppository Place 1 suppository (10 mg total) rectally as needed for moderate constipation.  . cholecalciferol (VITAMIN D) 1000 units tablet Take 1,000 Units by  mouth daily.   . Cyanocobalamin (B-12) 2500 MCG SUBL Place 2,500 mcg under the tongue daily.  Marland Kitchen dicyclomine (BENTYL) 10 MG capsule Take 1 capsule (10 mg total) by mouth 3 (three) times daily as needed for spasms.  Marland Kitchen docusate sodium (COLACE) 100 MG capsule Take 2 capsules (200 mg total) by mouth at bedtime.  . DULoxetine (CYMBALTA) 60 MG capsule Take 1 capsule by mouth twice daily  . esomeprazole (NEXIUM) 40 MG capsule TAKE 1 CAPSULE BY MOUTH ONCE DAILY BEFORE BREAKFAST  . famotidine (PEPCID) 40 MG tablet TAKE 1 TABLET BY MOUTH AT BEDTIME  . Fluticasone-Salmeterol (ADVAIR) 250-50 MCG/DOSE AEPB Inhale 1 puff into the lungs every 12 (twelve) hours.  Marland Kitchen levothyroxine (SYNTHROID, LEVOTHROID) 75 MCG tablet Take 37.5 mcg by mouth daily before breakfast.   . lisinopril (PRINIVIL,ZESTRIL) 10 MG tablet Take 10 mg by mouth daily.   . metFORMIN (GLUCOPHAGE-XR) 500 MG 24 hr tablet Take 500 mg by mouth at bedtime.  . ondansetron (ZOFRAN) 4 MG tablet TAKE 1 TABLET BY MOUTH TWICE DAILY AS NEEDED FOR NAUSEA FOR VOMITING  . propranolol (INDERAL) 20 MG tablet Take 1 tablet (20 mg total) by mouth 2 (two) times daily.  . simvastatin (ZOCOR) 20 MG tablet Take 20 mg by mouth every evening.  . traMADol (ULTRAM) 50 MG tablet Take 50-100 mg by mouth every 6 (six) hours as needed.  . traZODone (DESYREL) 100 MG tablet Take 100 mg by mouth at bedtime as needed for sleep.   Marland Kitchen  vitamin E 400 UNIT capsule Take 400 Units by mouth daily.  . [DISCONTINUED] albuterol (PROVENTIL) (2.5 MG/3ML) 0.083% nebulizer solution Take 2.5 mg by nebulization every 6 (six) hours as needed for wheezing or shortness of breath.  . [DISCONTINUED] Biotin 10 MG TABS Take 10 mg by mouth daily.   . [DISCONTINUED] cephALEXin (KEFLEX) 500 MG capsule Take 500 mg by mouth 2 (two) times daily.   . [DISCONTINUED] cyclobenzaprine (FLEXERIL) 10 MG tablet Take 1 tablet (10 mg total) by mouth 3 (three) times daily as needed for muscle spasms. (Patient not taking:  Reported on 05/20/2019)  . [DISCONTINUED] gabapentin (NEURONTIN) 400 MG capsule Take 400 mg by mouth 2 (two) times daily.   . [DISCONTINUED] GELATIN PO Take 1 capsule by mouth daily. After a meal  . [DISCONTINUED] lubiprostone (AMITIZA) 8 MCG capsule Take 2 capsules (16 mcg total) by mouth daily with breakfast. (Patient not taking: Reported on 03/17/2019)   No facility-administered encounter medications on file as of 06/30/2019.     Objective: Blood pressure 108/66, pulse 65, temperature (!) 97.3 F (36.3 C), temperature source Temporal, height 5' 1.5" (1.562 m), weight 124 lb 14.4 oz (56.7 kg). Patient is alert and in no acute distress. She is wearing a facial mask. Conjunctiva is pink. Sclera is nonicteric Oropharyngeal mucosa is normal. No neck masses or thyromegaly noted. Cardiac exam with regular rhythm normal S1 and S2. No murmur or gallop noted. Lungs are clear to auscultation. Abdomen is symmetrical soft and nontender with organomegaly or masses. No LE edema or clubbing noted.  Assessment:  #1.  Chronic GERD.  Heartburn is well controlled with therapy.  #2.  Esophageal dysphagia.  She has history of esophageal web which was last dilated disrupted in October 2018.  She is having significant difficulty again.  Therefore we will proceed with EGD with EGD.  #3.  Irritable bowel syndrome.  Patient is now having more constipation and diarrhea.  Will start on polyethylene glycol.  #4.  Patient is high risk for CRC.  Last colonoscopy was in October 2014.  Her brother was diagnosed with colon carcinoma in his 71s and died 60 years later with metastatic disease.  She is due for her screening colonoscopy.  Plan:  Polyethylene glycol half to 1 scoop daily or every other day.  She can titrate the dose and schedule. Esophagogastroduodenoscopy with esophageal dilation followed by high rescreening colonoscopy under monitored anesthesia care. Office visit in 6 months.

## 2019-07-06 ENCOUNTER — Ambulatory Visit: Payer: Medicare Other | Admitting: Neurology

## 2019-07-06 NOTE — Progress Notes (Deleted)
PATIENT: Heather Espinoza DOB: 12-16-1945  REASON FOR VISIT: follow up HISTORY FROM: patient  HISTORY OF PRESENT ILLNESS: Today 07/06/19  Heather Espinoza is a 74 year old female with history of chronic low back pain associated with bilateral L4-5 facet joint arthropathy.  She has been found to have an essential tremor, is placed on low-dose propanolol.  She underwent radiofrequency ablation of the facet joints, resulted in increased pain.  MRI showed potential for compression of the L5 nerve root, she was referred for neurosurgical opinion.  HISTORY  07/28/2018 Dr. Jannifer Franklin: Heather Espinoza is a 74 year old right-handed white female with a history of chronic low back pain associated with bilateral L4-5 facet joint arthropathy.  The patient has gained benefit previously with facet joint injections transiently, the pain however continues to return.  On her last injection, the radiologist brought up the possibility of a radiofrequency ablation procedure.  The patient was found to have an essential tremor, she was placed on low-dose propranolol, she has done well with this medication taking 20 mg twice daily.  She claims that her blood pressure generally runs around 120/85 and her pulse is around 60.  The patient tolerates the drug well.  She believes that the tremor is under good control.  She has been in the hospital on 08 April 2018 with pneumonia.  She has recovered from this.   REVIEW OF SYSTEMS: Out of a complete 14 system review of symptoms, the patient complains only of the following symptoms, and all other reviewed systems are negative.  ALLERGIES: Allergies  Allergen Reactions  . Flagyl [Metronidazole] Shortness Of Breath and Swelling  . Lyrica [Pregabalin] Shortness Of Breath  . Adhesive [Tape] Other (See Comments)    Takes off patient's skin  . Aspirin Other (See Comments)    Stomach bleed  . Latex Itching and Rash  . Phenergan [Promethazine Hcl] Other (See Comments)    Caused grogginess,  altered mental status  . Prednisone Anxiety  . Sulfa Antibiotics Nausea Only  . Tetanus Toxoids Swelling    Arm Area    HOME MEDICATIONS: Outpatient Medications Prior to Visit  Medication Sig Dispense Refill  . albuterol (PROVENTIL HFA;VENTOLIN HFA) 108 (90 Base) MCG/ACT inhaler Inhale 2 puffs into the lungs every 6 (six) hours as needed for wheezing or shortness of breath.    Marland Kitchen amLODipine (NORVASC) 5 MG tablet Take 1 tablet (5 mg total) by mouth daily. 90 tablet 3  . aspirin 81 MG chewable tablet Chew 81 mg by mouth daily.    . bisacodyl (DULCOLAX) 10 MG suppository Place 1 suppository (10 mg total) rectally as needed for moderate constipation. 12 suppository 0  . cholecalciferol (VITAMIN D) 1000 units tablet Take 1,000 Units by mouth daily.     . Cyanocobalamin (B-12) 2500 MCG SUBL Place 2,500 mcg under the tongue daily.    Marland Kitchen dicyclomine (BENTYL) 10 MG capsule Take 1 capsule (10 mg total) by mouth 3 (three) times daily as needed for spasms. 90 capsule 5  . docusate sodium (COLACE) 100 MG capsule Take 2 capsules (200 mg total) by mouth at bedtime. 10 capsule 0  . DULoxetine (CYMBALTA) 60 MG capsule Take 1 capsule by mouth twice daily 180 capsule 3  . esomeprazole (NEXIUM) 40 MG capsule TAKE 1 CAPSULE BY MOUTH ONCE DAILY BEFORE BREAKFAST 30 capsule 5  . famotidine (PEPCID) 40 MG tablet TAKE 1 TABLET BY MOUTH AT BEDTIME 30 tablet 3  . Fluticasone-Salmeterol (ADVAIR) 250-50 MCG/DOSE AEPB Inhale 1 puff into the  lungs every 12 (twelve) hours.    Marland Kitchen levothyroxine (SYNTHROID, LEVOTHROID) 75 MCG tablet Take 37.5 mcg by mouth daily before breakfast.     . lisinopril (PRINIVIL,ZESTRIL) 10 MG tablet Take 10 mg by mouth daily.     . metFORMIN (GLUCOPHAGE-XR) 500 MG 24 hr tablet Take 500 mg by mouth at bedtime.    . ondansetron (ZOFRAN) 4 MG tablet TAKE 1 TABLET BY MOUTH TWICE DAILY AS NEEDED FOR NAUSEA FOR VOMITING 30 tablet 1  . polyethylene glycol powder (GLYCOLAX/MIRALAX) 17 GM/SCOOP powder Take 8.5  g by mouth daily. 255 g 5  . propranolol (INDERAL) 20 MG tablet Take 1 tablet (20 mg total) by mouth 2 (two) times daily. 180 tablet 2  . simvastatin (ZOCOR) 20 MG tablet Take 20 mg by mouth every evening.    . traMADol (ULTRAM) 50 MG tablet Take 50-100 mg by mouth every 6 (six) hours as needed.    . traZODone (DESYREL) 100 MG tablet Take 100 mg by mouth at bedtime as needed for sleep.     . vitamin E 400 UNIT capsule Take 400 Units by mouth daily.     No facility-administered medications prior to visit.    PAST MEDICAL HISTORY: Past Medical History:  Diagnosis Date  . Arthritis   . COPD (chronic obstructive pulmonary disease) ([place])   . Diabetes ([place])   . Diverticulitis   . Diverticulosis   . Dyspnea   . Fibromyalgia   . High cholesterol   . Hypertension   . Hypothyroid   . IBS (irritable bowel syndrome)   . Kidney stone    Left Kidney  . Osteopenia   . Pneumonia   . Restless leg   . Sleep apnea    Doesn't use CPAP.    Marland Kitchen Tachycardia    per pt/fim  . Tremor, essential 01/07/2018    PAST SURGICAL HISTORY: Past Surgical History:  Procedure Laterality Date  . APPENDECTOMY    . BIOPSY  12/25/2016   Procedure: BIOPSY;  Surgeon: Rogene Houston, MD;  Location: [facility];  Service: Endoscopy;;  gastric   . BREAST LUMPECTOMY Right 2001  . CATARACT EXTRACTION Bilateral 2016  . CHOLECYSTECTOMY    . COLONOSCOPY N/A 12/17/2012   Procedure: COLONOSCOPY;  Surgeon: Rogene Houston, MD;  Location: [facility];  Service: Endoscopy;  Laterality: N/A;  215  . complete hysterectomy    . ESOPHAGEAL DILATION N/A 12/25/2016   Procedure: ESOPHAGEAL DILATION;  Surgeon: Rogene Houston, MD;  [facility]: [facility];  Service: Endoscopy;  Laterality: N/A;  . ESOPHAGOGASTRODUODENOSCOPY N/A 12/25/2016   Procedure: ESOPHAGOGASTRODUODENOSCOPY (EGD);  Surgeon: Rogene Houston, MD;  [facility]: [facility];  Service: Endoscopy;  Laterality: N/A;  730  . Heel tumor removed    . rt elbow  surgery    . TONSILLECTOMY      FAMILY HISTORY: Family History  Problem Relation Age of Onset  . Uterine cancer Mother   . Parkinson's disease Father   . Stroke Father   . Colon cancer Brother   . Lung cancer Brother   . COPD Sister   . Diabetes Grandchild   . Asthma Son     SOCIAL HISTORY: Social History   Socioeconomic History  . Marital status: Married    Spouse name: Not on file  . Number of children: 2  . Years of education: College  . Highest education level: Not on file  Occupational History  . Occupation: Retired  Tobacco Use  .  Smoking status: Never Smoker  . Smokeless tobacco: Never Used  Substance and Sexual Activity  . Alcohol use: No    Alcohol/week: 0.0 standard drinks  . Drug use: No  . Sexual activity: Not on file  Other Topics Concern  . Not on file  Social History Narrative   Lives at home w/ her husband   Right-handed   Occasional caffeine: decaf tea, diet sodas   Social Determinants of Health   Financial Resource Strain:   . Difficulty of Paying Living Expenses:   Food Insecurity:   . Worried About Charity fundraiser in the Last Year:   . Arboriculturist in the Last Year:   Transportation Needs:   . Film/video editor (Medical):   Marland Kitchen Lack of Transportation (Non-Medical):   Physical Activity:   . Days of Exercise per Week:   . Minutes of Exercise per Session:   Stress:   . Feeling of Stress :   Social Connections:   . Frequency of Communication with Friends and Family:   . Frequency of Social Gatherings with Friends and Family:   . Attends Religious Services:   . Active Member of Clubs or Organizations:   . Attends Archivist Meetings:   Marland Kitchen Marital Status:   Intimate Partner Violence:   . Fear of Current or Ex-Partner:   . Emotionally Abused:   Marland Kitchen Physically Abused:   . Sexually Abused:       PHYSICAL EXAM  There were no vitals filed for this visit. There is no height or weight on file to calculate  BMI.  Generalized: Well developed, in no acute distress   Neurological examination  Mentation: Alert oriented to time, place, history taking. Follows all commands speech and language fluent Cranial nerve II-XII: Pupils were equal round reactive to light. Extraocular movements were full, visual field were full on confrontational test. Facial sensation and strength were normal. Uvula tongue midline. Head turning and shoulder shrug  were normal and symmetric. Motor: The motor testing reveals 5 over 5 strength of all 4 extremities. Good symmetric motor tone is noted throughout.  Sensory: Sensory testing is intact to soft touch on all 4 extremities. No evidence of extinction is noted.  Coordination: Cerebellar testing reveals good finger-nose-finger and heel-to-shin bilaterally.  Gait and station: Gait is normal. Tandem gait is normal. Romberg is negative. No drift is seen.  Reflexes: Deep tendon reflexes are symmetric and normal bilaterally.   DIAGNOSTIC DATA (LABS, IMAGING, TESTING) - I reviewed patient records, labs, notes, testing and imaging myself where available.  Lab Results  Component Value Date   WBC 7.7 05/20/2019   HGB 10.9 Repeated and verified X2. (L) 05/20/2019   HCT 32.2 (L) 05/20/2019   MCV 90.1 05/20/2019   PLT 300.0 05/20/2019      Component Value Date/Time   NA 138 05/20/2019 1454   K 4.1 05/20/2019 1454   CL 104 05/20/2019 1454   CO2 27 05/20/2019 1454   GLUCOSE 103 (H) 05/20/2019 1454   BUN 17 05/20/2019 1454   CREATININE 1.17 05/20/2019 1454   CALCIUM 9.7 05/20/2019 1454   PROT 6.9 05/20/2019 1454   PROT 6.6 05/09/2018 1511   ALBUMIN 3.9 05/20/2019 1454   ALBUMIN 4.2 05/09/2018 1511   AST 20 05/20/2019 1454   ALT 18 05/20/2019 1454   ALKPHOS 50 05/20/2019 1454   BILITOT 0.3 05/20/2019 1454   BILITOT 0.3 05/09/2018 1511   GFRNONAA 39 (L) 02/05/2019 1008   GFRAA  45 (L) 02/05/2019 1008   Lab Results  Component Value Date   CHOL 129 05/09/2018   HDL 53  05/09/2018   LDLCALC 49 05/09/2018   TRIG 134 05/09/2018   CHOLHDL 2.4 05/09/2018   Lab Results  Component Value Date   HGBA1C 6.0 (H) 02/04/2019   Lab Results  Component Value Date   [id number] 02/04/2019   Lab Results  Component Value Date   TSH 1.340 05/09/2018      ASSESSMENT AND PLAN 74 y.o. year old female  has a past medical history of Arthritis, COPD (chronic obstructive pulmonary disease) ([place]), Diabetes ([place]), Diverticulitis, Diverticulosis, Dyspnea, Fibromyalgia, High cholesterol, Hypertension, Hypothyroid, IBS (irritable bowel syndrome), Kidney stone, Osteopenia, Pneumonia, Restless leg, Sleep apnea, Tachycardia, and Tremor, essential (01/07/2018). here with ***   I spent 15 minutes with the patient. 50% of this time was spent   Butler Denmark, [place], DNP 07/06/2019, 5:49 AM [facility] [address] [place], [place] [address] [phone]

## 2019-07-08 ENCOUNTER — Other Ambulatory Visit (INDEPENDENT_AMBULATORY_CARE_PROVIDER_SITE_OTHER): Payer: Self-pay | Admitting: *Deleted

## 2019-07-08 ENCOUNTER — Encounter (INDEPENDENT_AMBULATORY_CARE_PROVIDER_SITE_OTHER): Payer: Self-pay | Admitting: *Deleted

## 2019-07-10 ENCOUNTER — Other Ambulatory Visit (INDEPENDENT_AMBULATORY_CARE_PROVIDER_SITE_OTHER): Payer: Self-pay | Admitting: *Deleted

## 2019-07-10 ENCOUNTER — Telehealth (INDEPENDENT_AMBULATORY_CARE_PROVIDER_SITE_OTHER): Payer: Self-pay | Admitting: *Deleted

## 2019-07-10 DIAGNOSIS — Z1211 Encounter for screening for malignant neoplasm of colon: Secondary | ICD-10-CM

## 2019-07-10 DIAGNOSIS — K219 Gastro-esophageal reflux disease without esophagitis: Secondary | ICD-10-CM

## 2019-07-10 DIAGNOSIS — R1319 Other dysphagia: Secondary | ICD-10-CM

## 2019-07-10 MED ORDER — PLENVU 140 G PO SOLR: 1.0000 | Freq: Once | ORAL | 0 refills | Status: AC

## 2019-07-10 NOTE — Telephone Encounter (Signed)
Patient needs Plenvu (copay card) ° °

## 2019-07-21 NOTE — Patient Instructions (Signed)
Heather Espinoza  07/21/2019     @PREFPERIOPPHARMACY @   Your procedure is scheduled on 07/24/2019.  Report to [facility] at 6:15 A.M.  Call this number if you have problems the morning of surgery:  [phone]   Remember:  Do not eat or drink after midnight.  Please follow instructions given to you by Dr. Olevia Perches office.      Take these medicines the morning of surgery with A SIP OF WATER : Amlodipine, Cymbalta, Nexium, Pepcid, Levothyroxine, Lisinopril, Propranolol and Tramadol.  Please use your inhaler before coming to the hospital.    Do not wear jewelry, make-up or nail polish.  Do not wear lotions, powders, or perfumes, or deodorant.  Do not shave 48 hours prior to surgery.  Men may shave face and neck.  Do not bring valuables to the hospital.  [facility] is not responsible for any belongings or valuables.  Contacts, dentures or bridgework may not be worn into surgery.  Leave your suitcase in the car.  After surgery it may be brought to your room.  For patients admitted to the hospital, discharge time will be determined by your treatment team.  Patients discharged the day of surgery will not be allowed to drive home.   Name and phone number of your driver:   family Special instructions:  N/a  Please read over the following fact sheets that you were given. Care and Recovery After Surgery  Upper Endoscopy, Adult Upper endoscopy is a procedure to look inside the upper GI (gastrointestinal) tract. The upper GI tract is made up of:  The part of the body that moves food from your mouth to your stomach (esophagus).  The stomach.  The first part of your small intestine (duodenum). This procedure is also called esophagogastroduodenoscopy (EGD) or gastroscopy. In this procedure, your health care provider passes a thin, flexible tube (endoscope) through your mouth and down your esophagus into your stomach. A small camera is attached to the end of the tube. Images from the camera  appear on a monitor in the exam room. During this procedure, your health care provider may also remove a small piece of tissue to be sent to a lab and examined under a microscope (biopsy). Your health care provider may do an upper endoscopy to diagnose cancers of the upper GI tract. You may also have this procedure to find the cause of other conditions, such as:  Stomach pain.  Heartburn.  Pain or problems when swallowing.  Nausea and vomiting.  Stomach bleeding.  Stomach ulcers. Tell a health care provider about:  Any allergies you have.  All medicines you are taking, including vitamins, herbs, eye drops, creams, and over-the-counter medicines.  Any problems you or family members have had with anesthetic medicines.  Any blood disorders you have.  Any surgeries you have had.  Any medical conditions you have.  Whether you are pregnant or may be pregnant. What are the risks? Generally, this is a safe procedure. However, problems may occur, including:  Infection.  Bleeding.  Allergic reactions to medicines.  A tear or hole (perforation) in the esophagus, stomach, or duodenum. What happens before the procedure? Staying hydrated Follow instructions from your health care provider about hydration, which may include:  Up to 2 hours before the procedure - you may continue to drink clear liquids, such as water, clear fruit juice, black coffee, and plain tea.  Eating and drinking restrictions Follow instructions from your health care provider about eating and drinking,  which may include:  8 hours before the procedure - stop eating heavy meals or foods, such as meat, fried foods, or fatty foods.  6 hours before the procedure - stop eating light meals or foods, such as toast or cereal.  6 hours before the procedure - stop drinking milk or drinks that contain milk.  2 hours before the procedure - stop drinking clear liquids. Medicines Ask your health care provider  about:  Changing or stopping your regular medicines. This is especially important if you are taking diabetes medicines or blood thinners.  Taking medicines such as aspirin and ibuprofen. These medicines can thin your blood. Do not take these medicines unless your health care provider tells you to take them.  Taking over-the-counter medicines, vitamins, herbs, and supplements. General instructions  Plan to have someone take you home from the hospital or clinic.  If you will be going home right after the procedure, plan to have someone with you for 24 hours.  Ask your health care provider what steps will be taken to help prevent infection. What happens during the procedure?   An IV will be inserted into one of your veins.  You may be given one or more of the following: ? A medicine to help you relax (sedative). ? A medicine to numb the throat (local anesthetic).  You will lie on your left side on an exam table.  Your health care provider will pass the endoscope through your mouth and down your esophagus.  Your health care provider will use the scope to check the inside of your esophagus, stomach, and duodenum. Biopsies may be taken.  The endoscope will be removed. The procedure may vary among health care providers and hospitals. What happens after the procedure?  Your blood pressure, heart rate, breathing rate, and blood oxygen level will be monitored until you leave the hospital or clinic.  Do not drive for 24 hours if you were given a sedative during your procedure.  When your throat is no longer numb, you may be given some fluids to drink.  It is up to you to get the results of your procedure. Ask your health care provider, or the department that is doing the procedure, when your results will be ready. Summary  Upper endoscopy is a procedure to look inside the upper GI tract.  During the procedure, an IV will be inserted into one of your veins. You may be given a medicine  to help you relax.  A medicine will be used to numb your throat.  The endoscope will be passed through your mouth and down your esophagus. This information is not intended to replace advice given to you by your health care provider. Make sure you discuss any questions you have with your health care provider. Document Revised: 08/28/2017 Document Reviewed: 08/05/2017 Elsevier Patient Education  [place].

## 2019-07-22 ENCOUNTER — Encounter (HOSPITAL_COMMUNITY)
Admission: RE | Admit: 2019-07-22 | Discharge: 2019-07-22 | Disposition: A | Discharge status: Home / Self Care | Payer: Medicare Other | Source: Ambulatory Visit | Attending: Internal Medicine | Admitting: Internal Medicine

## 2019-07-22 ENCOUNTER — Other Ambulatory Visit: Payer: Self-pay

## 2019-07-22 ENCOUNTER — Encounter (HOSPITAL_COMMUNITY): Payer: Self-pay

## 2019-07-22 ENCOUNTER — Other Ambulatory Visit (HOSPITAL_COMMUNITY)
Admission: RE | Admit: 2019-07-22 | Discharge: 2019-07-22 | Disposition: A | Discharge status: Home / Self Care | Payer: Medicare Other | Source: Ambulatory Visit | Attending: Internal Medicine | Admitting: Internal Medicine

## 2019-07-22 DIAGNOSIS — Z01812 Encounter for preprocedural laboratory examination: Secondary | ICD-10-CM | POA: Diagnosis not present

## 2019-07-22 DIAGNOSIS — Z20822 Contact with and (suspected) exposure to covid-19: Secondary | ICD-10-CM | POA: Insufficient documentation

## 2019-07-22 HISTORY — DX: Personal history of urinary calculi: Z87.442

## 2019-07-23 LAB — SARS CORONAVIRUS 2 (TAT 6-24 HRS): SARS Coronavirus 2: NEGATIVE

## 2019-07-24 ENCOUNTER — Ambulatory Visit (HOSPITAL_COMMUNITY): Payer: Medicare Other | Admitting: Anesthesiology

## 2019-07-24 ENCOUNTER — Ambulatory Visit (HOSPITAL_COMMUNITY)
Admission: RE | Admit: 2019-07-24 | Discharge: 2019-07-24 | Disposition: A | Discharge status: Home / Self Care | Payer: Medicare Other | Attending: Internal Medicine | Admitting: Internal Medicine

## 2019-07-24 ENCOUNTER — Encounter (HOSPITAL_COMMUNITY): Payer: Self-pay | Admitting: Internal Medicine

## 2019-07-24 ENCOUNTER — Encounter (HOSPITAL_COMMUNITY)
Admission: RE | Disposition: A | Discharge status: Home / Self Care | Payer: Self-pay | Source: Home / Self Care | Attending: Internal Medicine

## 2019-07-24 DIAGNOSIS — K219 Gastro-esophageal reflux disease without esophagitis: Secondary | ICD-10-CM

## 2019-07-24 DIAGNOSIS — Z91048 Other nonmedicinal substance allergy status: Secondary | ICD-10-CM | POA: Insufficient documentation

## 2019-07-24 DIAGNOSIS — Z79899 Other long term (current) drug therapy: Secondary | ICD-10-CM | POA: Insufficient documentation

## 2019-07-24 DIAGNOSIS — K581 Irritable bowel syndrome with constipation: Secondary | ICD-10-CM | POA: Diagnosis not present

## 2019-07-24 DIAGNOSIS — R0602 Shortness of breath: Secondary | ICD-10-CM | POA: Diagnosis not present

## 2019-07-24 DIAGNOSIS — Z9049 Acquired absence of other specified parts of digestive tract: Secondary | ICD-10-CM | POA: Insufficient documentation

## 2019-07-24 DIAGNOSIS — Z87442 Personal history of urinary calculi: Secondary | ICD-10-CM | POA: Insufficient documentation

## 2019-07-24 DIAGNOSIS — I10 Essential (primary) hypertension: Secondary | ICD-10-CM | POA: Diagnosis not present

## 2019-07-24 DIAGNOSIS — G4730 Sleep apnea, unspecified: Secondary | ICD-10-CM | POA: Diagnosis not present

## 2019-07-24 DIAGNOSIS — K6289 Other specified diseases of anus and rectum: Secondary | ICD-10-CM | POA: Insufficient documentation

## 2019-07-24 DIAGNOSIS — E039 Hypothyroidism, unspecified: Secondary | ICD-10-CM | POA: Diagnosis not present

## 2019-07-24 DIAGNOSIS — M1990 Unspecified osteoarthritis, unspecified site: Secondary | ICD-10-CM | POA: Insufficient documentation

## 2019-07-24 DIAGNOSIS — K644 Residual hemorrhoidal skin tags: Secondary | ICD-10-CM | POA: Insufficient documentation

## 2019-07-24 DIAGNOSIS — Z823 Family history of stroke: Secondary | ICD-10-CM | POA: Insufficient documentation

## 2019-07-24 DIAGNOSIS — M8580 Other specified disorders of bone density and structure, unspecified site: Secondary | ICD-10-CM | POA: Insufficient documentation

## 2019-07-24 DIAGNOSIS — Z886 Allergy status to analgesic agent status: Secondary | ICD-10-CM | POA: Insufficient documentation

## 2019-07-24 DIAGNOSIS — Z90710 Acquired absence of both cervix and uterus: Secondary | ICD-10-CM | POA: Insufficient documentation

## 2019-07-24 DIAGNOSIS — R1314 Dysphagia, pharyngoesophageal phase: Secondary | ICD-10-CM | POA: Diagnosis not present

## 2019-07-24 DIAGNOSIS — Z801 Family history of malignant neoplasm of trachea, bronchus and lung: Secondary | ICD-10-CM | POA: Insufficient documentation

## 2019-07-24 DIAGNOSIS — K635 Polyp of colon: Secondary | ICD-10-CM | POA: Diagnosis not present

## 2019-07-24 DIAGNOSIS — Z7982 Long term (current) use of aspirin: Secondary | ICD-10-CM | POA: Insufficient documentation

## 2019-07-24 DIAGNOSIS — K449 Diaphragmatic hernia without obstruction or gangrene: Secondary | ICD-10-CM | POA: Insufficient documentation

## 2019-07-24 DIAGNOSIS — E7800 Pure hypercholesterolemia, unspecified: Secondary | ICD-10-CM | POA: Insufficient documentation

## 2019-07-24 DIAGNOSIS — J449 Chronic obstructive pulmonary disease, unspecified: Secondary | ICD-10-CM | POA: Insufficient documentation

## 2019-07-24 DIAGNOSIS — M797 Fibromyalgia: Secondary | ICD-10-CM | POA: Insufficient documentation

## 2019-07-24 DIAGNOSIS — Z888 Allergy status to other drugs, medicaments and biological substances status: Secondary | ICD-10-CM | POA: Insufficient documentation

## 2019-07-24 DIAGNOSIS — Z7984 Long term (current) use of oral hypoglycemic drugs: Secondary | ICD-10-CM | POA: Insufficient documentation

## 2019-07-24 DIAGNOSIS — K5730 Diverticulosis of large intestine without perforation or abscess without bleeding: Secondary | ICD-10-CM | POA: Diagnosis not present

## 2019-07-24 DIAGNOSIS — Q394 Esophageal web: Secondary | ICD-10-CM | POA: Insufficient documentation

## 2019-07-24 DIAGNOSIS — D649 Anemia, unspecified: Secondary | ICD-10-CM | POA: Insufficient documentation

## 2019-07-24 DIAGNOSIS — D175 Benign lipomatous neoplasm of intra-abdominal organs: Secondary | ICD-10-CM | POA: Diagnosis not present

## 2019-07-24 DIAGNOSIS — G2581 Restless legs syndrome: Secondary | ICD-10-CM | POA: Insufficient documentation

## 2019-07-24 DIAGNOSIS — Z800 Family history of malignant neoplasm of digestive organs: Secondary | ICD-10-CM | POA: Diagnosis not present

## 2019-07-24 DIAGNOSIS — R1319 Other dysphagia: Secondary | ICD-10-CM

## 2019-07-24 DIAGNOSIS — D123 Benign neoplasm of transverse colon: Secondary | ICD-10-CM | POA: Diagnosis not present

## 2019-07-24 DIAGNOSIS — Z1211 Encounter for screening for malignant neoplasm of colon: Secondary | ICD-10-CM | POA: Diagnosis not present

## 2019-07-24 DIAGNOSIS — Z820 Family history of epilepsy and other diseases of the nervous system: Secondary | ICD-10-CM | POA: Insufficient documentation

## 2019-07-24 DIAGNOSIS — E119 Type 2 diabetes mellitus without complications: Secondary | ICD-10-CM | POA: Insufficient documentation

## 2019-07-24 DIAGNOSIS — R0600 Dyspnea, unspecified: Secondary | ICD-10-CM | POA: Insufficient documentation

## 2019-07-24 DIAGNOSIS — Z825 Family history of asthma and other chronic lower respiratory diseases: Secondary | ICD-10-CM | POA: Insufficient documentation

## 2019-07-24 DIAGNOSIS — Z91040 Latex allergy status: Secondary | ICD-10-CM | POA: Insufficient documentation

## 2019-07-24 DIAGNOSIS — Z882 Allergy status to sulfonamides status: Secondary | ICD-10-CM | POA: Insufficient documentation

## 2019-07-24 DIAGNOSIS — Z887 Allergy status to serum and vaccine status: Secondary | ICD-10-CM | POA: Insufficient documentation

## 2019-07-24 DIAGNOSIS — Z881 Allergy status to other antibiotic agents status: Secondary | ICD-10-CM | POA: Insufficient documentation

## 2019-07-24 DIAGNOSIS — G709 Myoneural disorder, unspecified: Secondary | ICD-10-CM | POA: Insufficient documentation

## 2019-07-24 DIAGNOSIS — Z9849 Cataract extraction status, unspecified eye: Secondary | ICD-10-CM | POA: Insufficient documentation

## 2019-07-24 DIAGNOSIS — Z833 Family history of diabetes mellitus: Secondary | ICD-10-CM | POA: Insufficient documentation

## 2019-07-24 DIAGNOSIS — K317 Polyp of stomach and duodenum: Secondary | ICD-10-CM | POA: Diagnosis not present

## 2019-07-24 DIAGNOSIS — Z7951 Long term (current) use of inhaled steroids: Secondary | ICD-10-CM | POA: Insufficient documentation

## 2019-07-24 HISTORY — PX: ESOPHAGOGASTRODUODENOSCOPY (EGD) WITH PROPOFOL: SHX5813

## 2019-07-24 HISTORY — PX: POLYPECTOMY: SHX5525

## 2019-07-24 HISTORY — PX: COLONOSCOPY WITH PROPOFOL: SHX5780

## 2019-07-24 HISTORY — PX: ESOPHAGEAL DILATION: SHX303

## 2019-07-24 LAB — GLUCOSE, CAPILLARY: Glucose-Capillary: 88 mg/dL (ref 70–99)

## 2019-07-24 SURGERY — COLONOSCOPY WITH PROPOFOL
Anesthesia: General

## 2019-07-24 MED ORDER — LACTATED RINGERS IV SOLN: INTRAVENOUS | Status: DC | PRN

## 2019-07-24 MED ORDER — ORAL CARE MOUTH RINSE: 15.0000 mL | Freq: Once | OROMUCOSAL | Status: DC

## 2019-07-24 MED ORDER — CHLORHEXIDINE GLUCONATE CLOTH 2 % EX PADS: 6.0000 | MEDICATED_PAD | Freq: Once | CUTANEOUS | Status: DC

## 2019-07-24 MED ORDER — LACTATED RINGERS IV SOLN: INTRAVENOUS | Status: DC

## 2019-07-24 MED ORDER — PROPOFOL 10 MG/ML IV BOLUS
INTRAVENOUS | Status: AC
  Filled 2019-07-24: qty 40

## 2019-07-24 MED ORDER — CHLORHEXIDINE GLUCONATE 0.12 % MT SOLN: 15.0000 mL | Freq: Once | OROMUCOSAL | Status: DC

## 2019-07-24 MED ORDER — PROPOFOL 10 MG/ML IV BOLUS
INTRAVENOUS | Status: AC
  Filled 2019-07-24: qty 20

## 2019-07-24 MED ORDER — PROPOFOL 10 MG/ML IV BOLUS
INTRAVENOUS | Status: DC | PRN
  Administered 2019-07-24: 20 mg via INTRAVENOUS
  Administered 2019-07-24: 50 mg via INTRAVENOUS
  Administered 2019-07-24 (×4): 20 mg via INTRAVENOUS

## 2019-07-24 MED ORDER — PROPOFOL 500 MG/50ML IV EMUL
INTRAVENOUS | Status: DC | PRN
  Administered 2019-07-24: 150 ug/kg/min via INTRAVENOUS

## 2019-07-24 NOTE — Anesthesia Postprocedure Evaluation (Signed)
Anesthesia Post Note  Patient: MIKAIAH NEWCOMBE  Procedure(s) Performed: COLONOSCOPY WITH PROPOFOL (N/A ) ESOPHAGOGASTRODUODENOSCOPY (EGD) WITH PROPOFOL (N/A ) ESOPHAGEAL DILATION (N/A ) POLYPECTOMY  Patient location during evaluation: PACU Anesthesia Type: General Level of consciousness: awake and alert and oriented Pain management: pain level controlled Vital Signs Assessment: post-procedure vital signs reviewed and stable Respiratory status: spontaneous breathing Cardiovascular status: blood pressure returned to baseline and stable Postop Assessment: no apparent nausea or vomiting Anesthetic complications: no     Last Vitals:  Vitals:   07/24/19 0644  BP: (!) 150/61  Pulse: 63  Resp: 17  Temp: 36.6 C  SpO2: 100%    Last Pain:  Vitals:   07/24/19 0734  TempSrc:   PainSc: 3                  Nichollas Perusse

## 2019-07-24 NOTE — H&P (Signed)
Heather Espinoza is an 74 y.o. female.   Chief Complaint: Patient is here for esophagogastroduodenoscopy with esophageal dilation and colonoscopy. HPI: Patient is 74 year old Caucasian female who has chronic GERD as well as history of esophageal valve who presents with recurrent solid food with dysphagia.  Heartburn is well controlled with PPI.  Last EGD with dilation was in October 2018.  She is also undergoing high rescreening colonoscopy.  Last colonoscopy was in October 2014.  Her brother was diagnosed with colon carcinoma in his 74s and died of metastatic disease at age 9.  Patient has history of IBS both with diarrhea and constipation but lately she has been more constipated and using polyethylene glycol.  No history of melena or rectal bleeding. She has been off aspirin for 2 days.  Past Medical History:  Diagnosis Date  . Arthritis   . COPD (chronic obstructive pulmonary disease) ([place])   . Diabetes ([place])   . Diverticulitis   . Diverticulosis   . Dyspnea   . Fibromyalgia   . High cholesterol   . History of kidney stones   . Hypertension   . Hypothyroid   . IBS (irritable bowel syndrome)   . Osteopenia   . Pneumonia   . Restless leg   . Sleep apnea    Doesn't use CPAP.    .       . Tremor, essential 01/07/2018    Past Surgical History:  Procedure Laterality Date  . APPENDECTOMY    . BIOPSY  12/25/2016   Procedure: BIOPSY;  Surgeon: Rogene Houston, MD;  Location: [facility];  Service: Endoscopy;;  gastric   . BREAST LUMPECTOMY Right 2001  . CATARACT EXTRACTION Bilateral 2016  . CHOLECYSTECTOMY    . COLONOSCOPY N/A 12/17/2012   Procedure: COLONOSCOPY;  Surgeon: Rogene Houston, MD;  Location: [facility];  Service: Endoscopy;  Laterality: N/A;  215  . complete hysterectomy    . ESOPHAGEAL DILATION N/A 12/25/2016   Procedure: ESOPHAGEAL DILATION;  Surgeon: Rogene Houston, MD;  [facility]: [facility];  Service: Endoscopy;  Laterality: N/A;  .  ESOPHAGOGASTRODUODENOSCOPY N/A 12/25/2016   Procedure: ESOPHAGOGASTRODUODENOSCOPY (EGD);  Surgeon: Rogene Houston, MD;  Location: [facility];  Service: Endoscopy;  Laterality: N/A;  730  . Heel tumor removed    . rt elbow surgery    . TONSILLECTOMY      Family History  Problem Relation Age of Onset  . Uterine cancer Mother   . Parkinson's disease Father   . Stroke Father   . Colon cancer Brother   . Lung cancer Brother   . COPD Sister   . Diabetes Grandchild   . Asthma Son    Social History:  reports that she has never smoked. She has never used smokeless tobacco. She reports that she does not drink alcohol or use drugs.  Allergies:  Allergies  Allergen Reactions  . Flagyl [Metronidazole] Shortness Of Breath and Swelling  . Lyrica [Pregabalin] Shortness Of Breath  . Adhesive [Tape] Other (See Comments)    Takes off patient's skin  . Aspirin Other (See Comments)    Stomach bleed  . Latex Itching and Rash  . Phenergan [Promethazine Hcl] Other (See Comments)    Caused grogginess, altered mental status  . Prednisone Anxiety  . Sulfa Antibiotics Nausea Only  . Tetanus Toxoids Swelling    Arm Area    Medications Prior to Admission  Medication Sig Dispense Refill  . acetaminophen (TYLENOL) 500 MG  tablet Take 1,000 mg by mouth every 6 (six) hours as needed (for pain.).    Marland Kitchen albuterol (PROVENTIL HFA;VENTOLIN HFA) 108 (90 Base) MCG/ACT inhaler Inhale 2 puffs into the lungs every 6 (six) hours as needed for wheezing or shortness of breath.    Marland Kitchen amLODipine (NORVASC) 5 MG tablet Take 1 tablet (5 mg total) by mouth daily. 90 tablet 3  . aspirin 81 MG chewable tablet Chew 81 mg by mouth at bedtime.     . cholecalciferol (VITAMIN D) 1000 units tablet Take 1,000 Units by mouth daily.     . Cyanocobalamin (B-12) 2500 MCG SUBL Take 2,500 mcg by mouth daily.     Marland Kitchen dicyclomine (BENTYL) 10 MG capsule Take 1 capsule (10 mg total) by mouth 3 (three) times daily as needed for spasms.  (Patient taking differently: Take 10 mg by mouth 3 (three) times daily as needed for spasms (IBS/Diverticulitis symptoms.). ) 90 capsule 5  . docusate sodium (COLACE) 100 MG capsule Take 2 capsules (200 mg total) by mouth at bedtime. (Patient taking differently: Take 200 mg by mouth daily as needed (constipation.). ) 10 capsule 0  . DULoxetine (CYMBALTA) 60 MG capsule Take 1 capsule by mouth twice daily (Patient taking differently: Take 60 mg by mouth in the morning and at bedtime. ) 180 capsule 3  . esomeprazole (NEXIUM) 40 MG capsule TAKE 1 CAPSULE BY MOUTH ONCE DAILY BEFORE BREAKFAST (Patient taking differently: Take 40 mg by mouth daily before breakfast. ) 30 capsule 5  . famotidine (PEPCID) 40 MG tablet TAKE 1 TABLET BY MOUTH AT BEDTIME (Patient taking differently: Take 40 mg by mouth at bedtime. ) 30 tablet 3  . Fluticasone-Salmeterol (ADVAIR) 250-50 MCG/DOSE AEPB Inhale 1 puff into the lungs every 12 (twelve) hours.    Marland Kitchen levothyroxine (SYNTHROID, LEVOTHROID) 75 MCG tablet Take 37.5 mcg by mouth daily before breakfast.     . lisinopril (PRINIVIL,ZESTRIL) 10 MG tablet Take 10 mg by mouth daily.     . metFORMIN (GLUCOPHAGE-XR) 500 MG 24 hr tablet Take 500 mg by mouth at bedtime.    . ondansetron (ZOFRAN) 4 MG tablet TAKE 1 TABLET BY MOUTH TWICE DAILY AS NEEDED FOR NAUSEA FOR VOMITING (Patient taking differently: Take 4 mg by mouth 2 (two) times daily as needed for nausea or vomiting. G) 30 tablet 1  . polyethylene glycol powder (GLYCOLAX/MIRALAX) 17 GM/SCOOP powder Take 8.5 g by mouth daily. (Patient taking differently: Take 8.5 g by mouth daily as needed (constipation.). ) 255 g 5  . propranolol (INDERAL) 20 MG tablet Take 1 tablet (20 mg total) by mouth 2 (two) times daily. 180 tablet 2  . simvastatin (ZOCOR) 20 MG tablet Take 20 mg by mouth every evening.    . traMADol (ULTRAM) 50 MG tablet Take 50-100 mg by mouth every 6 (six) hours as needed (back pain.).     Marland Kitchen traZODone (DESYREL) 100 MG  tablet Take 100 mg by mouth at bedtime.     . vitamin E 400 UNIT capsule Take 400 Units by mouth daily.      Results for orders placed or performed during the hospital encounter of 07/24/19 (from the past 48 hour(s))  Glucose, capillary     Status: None   Collection Time: 07/24/19  6:42 AM  Result Value Ref Range   Glucose-Capillary 88 70 - 99 mg/dL    Comment: Glucose reference range applies only to samples taken after fasting for at least 8 hours.   No results found.  Review of Systems  Blood pressure (!) 150/61, pulse 63, temperature 97.9 F (36.6 C), temperature source Oral, resp. rate 17, height 5' 1.5" (1.562 m), weight 55.8 kg, SpO2 100 %. Physical Exam  Constitutional: She appears well-developed and well-nourished.  HENT:  Mouth/Throat: Oropharynx is clear and moist.  Eyes: Conjunctivae are normal. No scleral icterus.  Neck: No thyromegaly present.  Cardiovascular: Normal rate, regular rhythm and normal heart sounds.  No murmur heard. Respiratory: Effort normal and breath sounds normal.  GI:  Abdomen is symmetrical with right subcostal scar.  On palpation abdomen is soft.  She has mild generalized tenderness.  No organomegaly or masses  Musculoskeletal:        General: No edema.  Lymphadenopathy:    She has no cervical adenopathy.  Neurological: She is alert.  Skin: Skin is warm and dry.     Assessment/Plan  Esophageal dysphagia in patient with history of GERD and esophageal web. High risk screening colonoscopy.  First-degree relative with history of colon carcinoma younger than 58.  Hildred Laser, MD 07/24/2019, 7:19 AM

## 2019-07-24 NOTE — Transfer of Care (Signed)
Immediate Anesthesia Transfer of Care Note  Patient: Heather Espinoza  Procedure(s) Performed: COLONOSCOPY WITH PROPOFOL (N/A ) ESOPHAGOGASTRODUODENOSCOPY (EGD) WITH PROPOFOL (N/A ) ESOPHAGEAL DILATION (N/A ) POLYPECTOMY  Patient Location: PACU  Anesthesia Type:General  Level of Consciousness: awake  Airway & Oxygen Therapy: Patient Spontanous Breathing  Post-op Assessment: Report given to RN  Post vital signs: Reviewed and stable  Last Vitals:  Vitals Value Taken Time  BP    Temp    Pulse 82 07/24/19 0818  Resp 11 07/24/19 0818  SpO2 87 % 07/24/19 0818  Vitals shown include unvalidated device data.  Last Pain:  Vitals:   07/24/19 0734  TempSrc:   PainSc: 3          Complications: No apparent anesthesia complications

## 2019-07-24 NOTE — Anesthesia Preprocedure Evaluation (Signed)
Anesthesia Evaluation  Patient identified by MRN, date of birth, ID band Patient awake    Reviewed: Allergy & Precautions, H&P , NPO status , Patient's Chart, lab work & pertinent test results, reviewed documented beta blocker date and time   Airway Mallampati: II  TM Distance: >3 FB Neck ROM: full    Dental no notable dental hx.    Pulmonary shortness of breath, sleep apnea , pneumonia, resolved, COPD,    Pulmonary exam normal breath sounds clear to auscultation       Cardiovascular Exercise Tolerance: Good hypertension, negative cardio ROS   Rhythm:regular Rate:Normal     Neuro/Psych  Neuromuscular disease negative psych ROS   GI/Hepatic Neg liver ROS, GERD  Medicated,  Endo/Other  diabetesHypothyroidism   Renal/GU CRFRenal disease  negative genitourinary   Musculoskeletal   Abdominal   Peds  Hematology  (+) Blood dyscrasia, anemia ,   Anesthesia Other Findings   Reproductive/Obstetrics negative OB ROS                             Anesthesia Physical Anesthesia Plan  ASA: III  Anesthesia Plan: General   Post-op Pain Management:    Induction:   PONV Risk Score and Plan: 3 and Propofol infusion  Airway Management Planned:   Additional Equipment:   Intra-op Plan:   Post-operative Plan:   Informed Consent: I have reviewed the patients History and Physical, chart, labs and discussed the procedure including the risks, benefits and alternatives for the proposed anesthesia with the patient or authorized representative who has indicated his/her understanding and acceptance.     Dental Advisory Given  Plan Discussed with: CRNA  Anesthesia Plan Comments:         Anesthesia Quick Evaluation

## 2019-07-24 NOTE — Op Note (Signed)
[facility] Patient Name: Heather Espinoza Procedure Date: 07/24/2019 7:48 AM MRN: [id number] Date of Birth: [birth year] Attending MD: Hildred Laser , MD CSN: [id number] Age: 74 Admit Type: Outpatient Procedure:                Colonoscopy Indications:              Screening in patient at increased risk: Colorectal                            cancer in brother before age 72 Providers:                Hildred Laser, MD, Janeece Riggers, RN, Raphael Gibney,                            Technician Referring MD:             London Pepper, MD Medicines:                Propofol per Anesthesia Complications:            No immediate complications. Estimated Blood Loss:     Estimated blood loss was minimal. Procedure:                Pre-Anesthesia Assessment:                           - Prior to the procedure, a History and Physical                            was performed, and patient medications and                            allergies were reviewed. The patient's tolerance of                            previous anesthesia was also reviewed. The risks                            and benefits of the procedure and the sedation                            options and risks were discussed with the patient.                            All questions were answered, and informed consent                            was obtained. Prior Anticoagulants: The patient has                            taken no previous anticoagulant or antiplatelet                            agents except for aspirin. ASA Grade Assessment:  III - A patient with severe systemic disease. After                            reviewing the risks and benefits, the patient was                            deemed in satisfactory condition to undergo the                            procedure.                           After obtaining informed consent, the colonoscope                            was passed under direct vision.  Throughout the                            procedure, the patient's blood pressure, pulse, and                            oxygen saturations were monitored continuously. The                            [id number]-[id number] scope was introduced through                            the anus and advanced to the the cecum, identified                            by appendiceal orifice and ileocecal valve. The                            colonoscopy was performed without difficulty. The                            patient tolerated the procedure well. The quality                            of the bowel preparation was excellent. The                            ileocecal valve, appendiceal orifice, and rectum                            were photographed. Scope In: 7:51:44 AM Scope Out: 8:10:42 AM Scope Withdrawal Time: 0 hours 15 minutes 49 seconds  Total Procedure Duration: 0 hours 18 minutes 58 seconds  Findings:      The digital rectal exam findings include decreased sphincter tone.      Scattered diverticula were found in the entire colon.      A 10 mm polyp was found in the hepatic flexure. The polyp was flat. The       polyp was removed with a piecemeal technique using a cold snare.  Resection was complete, but the polyp tissue was only partially       retrieved. Two hemostatic clips were successfully placed (MR       conditional). For hemostasis, a clip was placed. There was no bleeding       at the end of the procedure. The pathology specimen was placed into       Bottle Number 1.      A small polyp was found in the splenic flexure. The polyp was sessile.       The polyp was removed with a cold snare. Resection and retrieval were       complete. The pathology specimen was placed into Bottle Number 2.      External hemorrhoids were found during retroflexion. The hemorrhoids       were small. Impression:               - Decreased sphincter tone found on digital rectal                             exam.                           - Diverticulosis in the entire examined colon.                           - One 10 mm polyp at the hepatic flexure, removed                            piecemeal using a cold snare. Complete resection.                            Partial retrieval. Clips (MR conditional) were                            placed. SKIP.                           - One small polyp at the splenic flexure, removed                            with a cold snare. Resected and retrieved.                           - External hemorrhoids. Moderate Sedation:      Per Anesthesia Care Recommendation:           - Patient has a contact number available for                            emergencies. The signs and symptoms of potential                            delayed complications were discussed with the                            patient. Return to normal activities tomorrow.  Written discharge instructions were provided to the                            patient.                           - {skip}other note today.                           - Continue present medications.                           - No aspirin, ibuprofen, naproxen, or other                            non-steroidal anti-inflammatory drugs for 3 days.                           - Await pathology results.                           - Repeat colonoscopy is recommended for                            surveillance. The colonoscopy date will be                            determined after pathology results from today's                            exam become available for review. Procedure Code(s):        --- Professional ---                           [phone], Colonoscopy, flexible; with removal of                            tumor(s), polyp(s), or other lesion(s) by snare                            technique Diagnosis Code(s):        --- Professional ---                           Z80.0, Family history of  malignant neoplasm of                            digestive organs                           K62.89, Other specified diseases of anus and rectum                           K64.4, Residual hemorrhoidal skin tags                           K63.5, Polyp of colon  K57.30, Diverticulosis of large intestine without                            perforation or abscess without bleeding CPT copyright 2019 American [employer] Association. All rights reserved. The codes documented in this report are preliminary and upon coder review may  be revised to meet current compliance requirements. Hildred Laser, MD Hildred Laser, MD 07/24/2019 8:26:50 AM This report has been signed electronically. Number of Addenda: 0

## 2019-07-24 NOTE — Op Note (Addendum)
[facility] Patient Name: Heather Espinoza Procedure Date: 07/24/2019 6:34 AM MRN: [id number] Date of Birth: September 24, 1945 Attending MD: Hildred Laser , MD CSN: [id number] Age: 74 Admit Type: Outpatient Procedure:                Upper GI endoscopy Indications:              Esophageal dysphagia Providers:                Hildred Laser, MD, Janeece Riggers, RN, Raphael Gibney,                            Technician Referring MD:             London Pepper, MD Medicines:                Propofol per Anesthesia Complications:            No immediate complications. Estimated Blood Loss:     Estimated blood loss was minimal. Procedure:                Pre-Anesthesia Assessment:                           - Prior to the procedure, a History and Physical                            was performed, and patient medications and                            allergies were reviewed. The patient's tolerance of                            previous anesthesia was also reviewed. The risks                            and benefits of the procedure and the sedation                            options and risks were discussed with the patient.                            All questions were answered, and informed consent                            was obtained. Prior Anticoagulants: The patient has                            taken no previous anticoagulant or antiplatelet                            agents except for aspirin. ASA Grade Assessment:                            III - A patient with severe systemic disease. After  reviewing the risks and benefits, the patient was                            deemed in satisfactory condition to undergo the                            procedure.                           After obtaining informed consent, the endoscope was                            passed under direct vision. Throughout the                            procedure, the patient's blood pressure, pulse,  and                            oxygen saturations were monitored continuously. The                            [id number]-[id number] scope was introduced through the                            mouth, and advanced to the second part of duodenum.                            The upper GI endoscopy was accomplished without                            difficulty. The patient tolerated the procedure                            well. Scope In: 7:38:10 AM Scope Out: 7:45:27 AM Total Procedure Duration: 0 hours 7 minutes 17 seconds  Findings:      The hypopharynx was normal.      A web was found in the proximal esophagus. The dilation site was       examined following endoscope reinsertion and showed mild mucosal       disruption and no perforation.      The Z-line was regular and was found 34 cm from the incisors.      A 3 cm hiatal hernia was present.      The entire examined stomach was normal.      There was a small lipoma in the duodenal bulb.      The second portion of the duodenum was normal. Impression:               - Normal hypopharynx.                           - Web in the proximal esophagus.                           - Z-line regular, 34 cm from the incisors.                           -  3 cm hiatal hernia.                           - Normal stomach.                           - Duodenal lipoma.                           - Normal second portion of the duodenum.                           - No specimens collected. Moderate Sedation:      Per Anesthesia Care Recommendation:           - Patient has a contact number available for                            emergencies. The signs and symptoms of potential                            delayed complications were discussed with the                            patient. Return to normal activities tomorrow.                            Written discharge instructions were provided to the                            patient.                           -  Mechanical soft diet today.                           - Continue present medications.                           - No aspirin, ibuprofen, naproxen, or other                            non-steroidal anti-inflammatory drugs for 3 days.                           - Repeat upper endoscopy PRN. Procedure Code(s):        --- Professional ---                           [phone], Esophagogastroduodenoscopy, flexible,                            transoral; diagnostic, including collection of                            specimen(s) by brushing or washing, when performed                            (  separate procedure) Diagnosis Code(s):        --- Professional ---                           Q39.4, Esophageal web                           K44.9, Diaphragmatic hernia without obstruction or                            gangrene                           D17.5, Benign lipomatous neoplasm of                            intra-abdominal organs                           R13.14, Dysphagia, pharyngoesophageal phase CPT copyright 2019 American [employer] Association. All rights reserved. The codes documented in this report are preliminary and upon coder review may  be revised to meet current compliance requirements. Hildred Laser, MD Hildred Laser, MD 07/24/2019 8:21:00 AM This report has been signed electronically. Number of Addenda: 1 Addendum Number: 1   Addendum Date: 07/31/2019 1:17:55 PM      Esophagus dilated by passing 66 Fr. Maloney dilator resulting in linear       mucosal disruption at proximal esophagus below UES. Hildred Laser, MD Hildred Laser, MD 07/31/2019 1:19:03 PM This report has been signed electronically.

## 2019-07-24 NOTE — Discharge Instructions (Signed)
No aspirin or NSAIDs for 3 days. Resume other medications as before. Modified carb high-fiber diet.  Soft foods for 24 hours. No driving for 24 hours. Physician will call with biopsy results. No MRI until clips have passes   Monitored Anesthesia Care, Care After These instructions provide you with information about caring for yourself after your procedure. Your health care provider may also give you more specific instructions. Your treatment has been planned according to current medical practices, but problems sometimes occur. Call your health care provider if you have any problems or questions after your procedure. What can I expect after the procedure? After your procedure, you may:  Feel sleepy for several hours.  Feel clumsy and have poor balance for several hours.  Feel forgetful about what happened after the procedure.  Have poor judgment for several hours.  Feel nauseous or vomit.  Have a sore throat if you had a breathing tube during the procedure. Follow these instructions at home: For at least 24 hours after the procedure:      Have a responsible adult stay with you. It is important to have someone help care for you until you are awake and alert.  Rest as needed.  Do not: ? Participate in activities in which you could fall or become injured. ? Drive. ? Use heavy machinery. ? Drink alcohol. ? Take sleeping pills or medicines that cause drowsiness. ? Make important decisions or sign legal documents. ? Take care of children on your own. Eating and drinking  Follow the diet that is recommended by your health care provider.  If you vomit, drink water, juice, or soup when you can drink without vomiting.  Make sure you have little or no nausea before eating solid foods. General instructions  Take over-the-counter and prescription medicines only as told by your health care provider.  If you have sleep apnea, surgery and certain medicines can increase your risk  for breathing problems. Follow instructions from your health care provider about wearing your sleep device: ? Anytime you are sleeping, including during daytime naps. ? While taking prescription pain medicines, sleeping medicines, or medicines that make you drowsy.  If you smoke, do not smoke without supervision.  Keep all follow-up visits as told by your health care provider. This is important. Contact a health care provider if:  You keep feeling nauseous or you keep vomiting.  You feel light-headed.  You develop a rash.  You have a fever. Get help right away if:  You have trouble breathing. Summary  For several hours after your procedure, you may feel sleepy and have poor judgment.  Have a responsible adult stay with you for at least 24 hours or until you are awake and alert. This information is not intended to replace advice given to you by your health care provider. Make sure you discuss any questions you have with your health care provider. Document Revised: 06/03/2017 Document Reviewed: 06/26/2015 Elsevier Patient Education  [place].    High-Fiber Diet Fiber, also called dietary fiber, is a type of carbohydrate that is found in fruits, vegetables, whole grains, and beans. A high-fiber diet can have many health benefits. Your health care provider may recommend a high-fiber diet to help:  Prevent constipation. Fiber can make your bowel movements more regular.  Lower your cholesterol.  Relieve the following conditions: ? Swelling of veins in the anus (hemorrhoids). ? Swelling and irritation (inflammation) of specific areas of the digestive tract (uncomplicated diverticulosis). ? A problem of the  large intestine (colon) that sometimes causes pain and diarrhea (irritable bowel syndrome, IBS).  Prevent overeating as part of a weight-loss plan.  Prevent heart disease, type 2 diabetes, and certain cancers. What is my plan? The recommended daily fiber intake in  grams (g) includes:  38 g for men age 42 or younger.  30 g for men over age 66.  45 g for women age 56 or younger.  21 g for women over age 23. You can get the recommended daily intake of dietary fiber by:  Eating a variety of fruits, vegetables, grains, and beans.  Taking a fiber supplement, if it is not possible to get enough fiber through your diet. What do I need to know about a high-fiber diet?  It is better to get fiber through food sources rather than from fiber supplements. There is not a lot of research about how effective supplements are.  Always check the fiber content on the nutrition facts label of any prepackaged food. Look for foods that contain 5 g of fiber or more per serving.  Talk with a diet and nutrition specialist (dietitian) if you have questions about specific foods that are recommended or not recommended for your medical condition, especially if those foods are not listed below.  Gradually increase how much fiber you consume. If you increase your intake of dietary fiber too quickly, you may have bloating, cramping, or gas.  Drink plenty of water. Water helps you to digest fiber. What are tips for following this plan?  Eat a wide variety of high-fiber foods.  Make sure that half of the grains that you eat each day are whole grains.  Eat breads and cereals that are made with whole-grain flour instead of refined flour or white flour.  Eat brown rice, bulgur wheat, or millet instead of white rice.  Start the day with a breakfast that is high in fiber, such as a cereal that contains 5 g of fiber or more per serving.  Use beans in place of meat in soups, salads, and pasta dishes.  Eat high-fiber snacks, such as berries, raw vegetables, nuts, and popcorn.  Choose whole fruits and vegetables instead of processed forms like juice or sauce. What foods can I eat?  Fruits Berries. Pears. Apples. Oranges. Avocado. Prunes and raisins. Dried  figs. Vegetables Sweet potatoes. Spinach. Kale. Artichokes. Cabbage. Broccoli. Cauliflower. Green peas. Carrots. Squash. Grains Whole-grain breads. Multigrain cereal. Oats and oatmeal. Brown rice. Barley. Bulgur wheat. [place]. Quinoa. Bran muffins. Popcorn. Rye wafer crackers. Meats and other proteins Navy, kidney, and pinto beans. Soybeans. Split peas. Lentils. Nuts and seeds. Dairy Fiber-fortified yogurt. Beverages Fiber-fortified soy milk. Fiber-fortified orange juice. Other foods Fiber bars. The items listed above may not be a complete list of recommended foods and beverages. Contact a dietitian for more options. What foods are not recommended? Fruits Fruit juice. Cooked, strained fruit. Vegetables Fried potatoes. Canned vegetables. Well-cooked vegetables. Grains White bread. Pasta made with refined flour. White rice. Meats and other proteins Fatty cuts of meat. Fried chicken or fried fish. Dairy Milk. Yogurt. Cream cheese. Sour cream. Fats and oils Butters. Beverages Soft drinks. Other foods Cakes and pastries. The items listed above may not be a complete list of foods and beverages to avoid. Contact a dietitian for more information. Summary  Fiber is a type of carbohydrate. It is found in fruits, vegetables, whole grains, and beans.  There are many health benefits of eating a high-fiber diet, such as preventing constipation, lowering blood cholesterol,  helping with weight loss, and reducing your risk of heart disease, diabetes, and certain cancers.  Gradually increase your intake of fiber. Increasing too fast can result in cramping, bloating, and gas. Drink plenty of water while you increase your fiber.  The best sources of fiber include whole fruits and vegetables, whole grains, nuts, seeds, and beans. This information is not intended to replace advice given to you by your health care provider. Make sure you discuss any questions you have with your health care  provider. Document Revised: 01/07/2017 Document Reviewed: 01/07/2017 Elsevier Patient Education  [place].    Colon Polyps  Polyps are tissue growths inside the body. Polyps can grow in many places, including the large intestine (colon). A polyp may be a round bump or a mushroom-shaped growth. You could have one polyp or several. Most colon polyps are noncancerous (benign). However, some colon polyps can become cancerous over time. Finding and removing the polyps early can help prevent this. What are the causes? The exact cause of colon polyps is not known. What increases the risk? You are more likely to develop this condition if you:  Have a family history of colon cancer or colon polyps.  Are older than 59 or older than 45 if you are African American.  Have inflammatory bowel disease, such as ulcerative colitis or Crohn's disease.  Have certain hereditary conditions, such as: ? Familial adenomatous polyposis. ? Lynch syndrome. ? Turcot syndrome. ? Peutz-Jeghers syndrome.  Are overweight.  Smoke cigarettes.  Do not get enough exercise.  Drink too much alcohol.  Eat a diet that is high in fat and red meat and low in fiber.  Had childhood cancer that was treated with abdominal radiation. What are the signs or symptoms? Most polyps do not cause symptoms. If you have symptoms, they may include:  Blood coming from your rectum when having a bowel movement.  Blood in your stool. The stool may look dark red or black.  Abdominal pain.  A change in bowel habits, such as constipation or diarrhea. How is this diagnosed? This condition is diagnosed with a colonoscopy. This is a procedure in which a lighted, flexible scope is inserted into the anus and then passed into the colon to examine the area. Polyps are sometimes found when a colonoscopy is done as part of routine cancer screening tests. How is this treated? Treatment for this condition involves removing any  polyps that are found. Most polyps can be removed during a colonoscopy. Those polyps will then be tested for cancer. Additional treatment may be needed depending on the results of testing. Follow these instructions at home: Lifestyle  Maintain a healthy weight, or lose weight if recommended by your health care provider.  Exercise every day or as told by your health care provider.  Do not use any products that contain nicotine or tobacco, such as cigarettes and e-cigarettes. If you need help quitting, ask your health care provider.  If you drink alcohol, limit how much you have: ? 0-1 drink a day for women. ? 0-2 drinks a day for men.  Be aware of how much alcohol is in your drink. In the [place] one drink equals one 12 oz bottle of beer (355 mL), one 5 oz glass of wine (148 mL), or one 1 oz shot of hard liquor (44 mL). Eating and drinking   Eat foods that are high in fiber, such as fruits, vegetables, and whole grains.  Eat foods that are high in calcium  and vitamin D, such as milk, cheese, yogurt, eggs, liver, fish, and broccoli.  Limit foods that are high in fat, such as fried foods and desserts.  Limit the amount of red meat and processed meat you eat, such as hot dogs, sausage, bacon, and lunch meats. General instructions  Keep all follow-up visits as told by your health care provider. This is important. ? This includes having regularly scheduled colonoscopies. ? Talk to your health care provider about when you need a colonoscopy. Contact a health care provider if:  You have new or worsening bleeding during a bowel movement.  You have new or increased blood in your stool.  You have a change in bowel habits.  You lose weight for no known reason. Summary  Polyps are tissue growths inside the body. Polyps can grow in many places, including the colon.  Most colon polyps are noncancerous (benign), but some can become cancerous over time.  This condition is diagnosed with a  colonoscopy.  Treatment for this condition involves removing any polyps that are found. Most polyps can be removed during a colonoscopy. This information is not intended to replace advice given to you by your health care provider. Make sure you discuss any questions you have with your health care provider. Document Revised: 06/20/2017 Document Reviewed: 06/20/2017 Elsevier Patient Education  [place].     Colonoscopy, Adult, Care After This sheet gives you information about how to care for yourself after your procedure. Your doctor may also give you more specific instructions. If you have problems or questions, call your doctor. What can I expect after the procedure? After the procedure, it is common to have:  A small amount of blood in your poop (stool) for 24 hours.  Some gas.  Mild cramping or bloating in your belly (abdomen). Follow these instructions at home: Eating and drinking   Drink enough fluid to keep your pee (urine) pale yellow.  Follow instructions from your doctor about what you cannot eat or drink.  Return to your normal diet as told by your doctor. Avoid heavy or fried foods that are hard to digest. Activity  Rest as told by your doctor.  Do not sit for a long time without moving. Get up to take short walks every 1-2 hours. This is important. Ask for help if you feel weak or unsteady.  Return to your normal activities as told by your doctor. Ask your doctor what activities are safe for you. To help cramping and bloating:   Try walking around.  Put heat on your belly as told by your doctor. Use the heat source that your doctor recommends, such as a moist heat pack or a heating pad. ? Put a towel between your skin and the heat source. ? Leave the heat on for 20-30 minutes. ? Remove the heat if your skin turns bright red. This is very important if you are unable to feel pain, heat, or cold. You may have a greater risk of getting burned. General  instructions  For the first 24 hours after the procedure: ? Do not drive or use machinery. ? Do not sign important documents. ? Do not drink alcohol. ? Do your daily activities more slowly than normal. ? Eat foods that are soft and easy to digest.  Take over-the-counter or prescription medicines only as told by your doctor.  Keep all follow-up visits as told by your doctor. This is important. Contact a doctor if:  You have blood in your poop  2-3 days after the procedure. Get help right away if:  You have more than a small amount of blood in your poop.  You see large clumps of tissue (blood clots) in your poop.  Your belly is swollen.  You feel like you may vomit (nauseous).  You vomit.  You have a fever.  You have belly pain that gets worse, and medicine does not help your pain. Summary  After the procedure, it is common to have a small amount of blood in your poop. You may also have mild cramping and bloating in your belly.  For the first 24 hours after the procedure, do not drive or use machinery, do not sign important documents, and do not drink alcohol.  Get help right away if you have a lot of blood in your poop, feel like you may vomit, have a fever, or have more belly pain. This information is not intended to replace advice given to you by your health care provider. Make sure you discuss any questions you have with your health care provider. Document Revised: 09/29/2018 Document Reviewed: 09/29/2018 Elsevier Patient Education  [place].    Esophageal Dilatation Esophageal dilatation, also called esophageal dilation, is a procedure to widen or open (dilate) a blocked or narrowed part of the esophagus. The esophagus is the part of the body that moves food and liquid from the mouth to the stomach. You may need this procedure if:  You have a buildup of scar tissue in your esophagus that makes it difficult, painful, or impossible to swallow. This can be  caused by gastroesophageal reflux disease (GERD).  You have cancer of the esophagus.  There is a problem with how food moves through your esophagus. In some cases, you may need this procedure repeated at a later time to dilate the esophagus gradually. Tell a health care provider about:  Any allergies you have.  All medicines you are taking, including vitamins, herbs, eye drops, creams, and over-the-counter medicines.  Any problems you or family members have had with anesthetic medicines.  Any blood disorders you have.  Any surgeries you have had.  Any medical conditions you have.  Any antibiotic medicines you are required to take before dental procedures.  Whether you are pregnant or may be pregnant. What are the risks? Generally, this is a safe procedure. However, problems may occur, including:  Bleeding due to a tear in the lining of the esophagus.  A hole (perforation) in the esophagus. What happens before the procedure?  Follow instructions from your health care provider about eating or drinking restrictions.  Ask your health care provider about changing or stopping your regular medicines. This is especially important if you are taking diabetes medicines or blood thinners.  Plan to have someone take you home from the hospital or clinic.  Plan to have a responsible adult care for you for at least 24 hours after you leave the hospital or clinic. This is important. What happens during the procedure?  You may be given a medicine to help you relax (sedative).  A numbing medicine may be sprayed into the back of your throat, or you may gargle the medicine.  Your health care provider may perform the dilatation using various surgical instruments, such as: ? Simple dilators. This instrument is carefully placed in the esophagus to stretch it. ? Guided wire bougies. This involves using an endoscope to insert a wire into the esophagus. A dilator is passed over this wire to  enlarge the esophagus.  Then the wire is removed. ? Balloon dilators. An endoscope with a small balloon at the end is inserted into the esophagus. The balloon is inflated to stretch the esophagus and open it up. The procedure may vary among health care providers and hospitals. What happens after the procedure?  Your blood pressure, heart rate, breathing rate, and blood oxygen level will be monitored until the medicines you were given have worn off.  Your throat may feel slightly sore and numb. This will improve slowly over time.  You will not be allowed to eat or drink until your throat is no longer numb.  When you are able to drink, urinate, and sit on the edge of the bed without nausea or dizziness, you may be able to return home. Follow these instructions at home:  Take over-the-counter and prescription medicines only as told by your health care provider.  Do not drive for 24 hours if you were given a sedative during your procedure.  You should have a responsible adult with you for 24 hours after the procedure.  Follow instructions from your health care provider about any eating or drinking restrictions.  Do not use any products that contain nicotine or tobacco, such as cigarettes and e-cigarettes. If you need help quitting, ask your health care provider.  Keep all follow-up visits as told by your health care provider. This is important. Get help right away if you:  Have a fever.  Have chest pain.  Have pain that is not relieved by medication.  Have trouble breathing.  Have trouble swallowing.  Vomit blood. Summary  Esophageal dilatation, also called esophageal dilation, is a procedure to widen or open (dilate) a blocked or narrowed part of the esophagus.  Plan to have someone take you home from the hospital or clinic.  For this procedure, a numbing medicine may be sprayed into the back of your throat, or you may gargle the medicine.  Do not drive for 24 hours if you  were given a sedative during your procedure. This information is not intended to replace advice given to you by your health care provider. Make sure you discuss any questions you have with your health care provider. Document Revised: 12/31/2018 Document Reviewed: 01/08/2017 Elsevier Patient Education  2020 [employer].

## 2019-07-27 LAB — SURGICAL PATHOLOGY

## 2019-07-31 ENCOUNTER — Other Ambulatory Visit (HOSPITAL_COMMUNITY)
Admission: RE | Admit: 2019-07-31 | Discharge: 2019-07-31 | Disposition: A | Discharge status: Home / Self Care | Payer: Medicare Other | Source: Ambulatory Visit | Attending: Pulmonary Disease | Admitting: Pulmonary Disease

## 2019-08-04 ENCOUNTER — Ambulatory Visit: Payer: PRIVATE HEALTH INSURANCE | Admitting: Adult Health

## 2019-08-05 ENCOUNTER — Other Ambulatory Visit (INDEPENDENT_AMBULATORY_CARE_PROVIDER_SITE_OTHER): Payer: Self-pay | Admitting: Internal Medicine

## 2019-08-14 ENCOUNTER — Other Ambulatory Visit (INDEPENDENT_AMBULATORY_CARE_PROVIDER_SITE_OTHER): Payer: Self-pay | Admitting: Gastroenterology

## 2019-08-14 DIAGNOSIS — E785 Hyperlipidemia, unspecified: Secondary | ICD-10-CM | POA: Diagnosis not present

## 2019-08-14 DIAGNOSIS — E1169 Type 2 diabetes mellitus with other specified complication: Secondary | ICD-10-CM | POA: Diagnosis not present

## 2019-08-14 DIAGNOSIS — I10 Essential (primary) hypertension: Secondary | ICD-10-CM | POA: Diagnosis not present

## 2019-08-14 DIAGNOSIS — R32 Unspecified urinary incontinence: Secondary | ICD-10-CM | POA: Diagnosis not present

## 2019-08-14 DIAGNOSIS — N183 Chronic kidney disease, stage 3 unspecified: Secondary | ICD-10-CM | POA: Diagnosis not present

## 2019-08-14 DIAGNOSIS — D649 Anemia, unspecified: Secondary | ICD-10-CM | POA: Diagnosis not present

## 2019-08-14 DIAGNOSIS — E039 Hypothyroidism, unspecified: Secondary | ICD-10-CM | POA: Diagnosis not present

## 2019-08-18 ENCOUNTER — Other Ambulatory Visit: Payer: Self-pay | Admitting: Neurology

## 2019-08-26 ENCOUNTER — Other Ambulatory Visit: Payer: Self-pay | Admitting: Neurology

## 2019-09-09 ENCOUNTER — Other Ambulatory Visit (INDEPENDENT_AMBULATORY_CARE_PROVIDER_SITE_OTHER): Payer: Self-pay | Admitting: Internal Medicine

## 2019-09-15 ENCOUNTER — Other Ambulatory Visit (HOSPITAL_COMMUNITY)
Admission: RE | Admit: 2019-09-15 | Discharge: 2019-09-15 | Disposition: A | Discharge status: Home / Self Care | Payer: Medicare Other | Source: Ambulatory Visit | Attending: Pulmonary Disease | Admitting: Pulmonary Disease

## 2019-09-22 ENCOUNTER — Ambulatory Visit: Payer: PRIVATE HEALTH INSURANCE | Admitting: Pulmonary Disease

## 2019-10-12 ENCOUNTER — Other Ambulatory Visit: Payer: Self-pay | Admitting: Cardiology

## 2019-10-12 ENCOUNTER — Other Ambulatory Visit (INDEPENDENT_AMBULATORY_CARE_PROVIDER_SITE_OTHER): Payer: Self-pay | Admitting: Gastroenterology

## 2019-10-22 ENCOUNTER — Encounter: Payer: Self-pay | Admitting: Pulmonary Disease

## 2019-10-22 ENCOUNTER — Other Ambulatory Visit: Payer: Self-pay

## 2019-10-22 ENCOUNTER — Ambulatory Visit (INDEPENDENT_AMBULATORY_CARE_PROVIDER_SITE_OTHER): Payer: Medicare Other | Admitting: Pulmonary Disease

## 2019-10-22 VITALS — BP 116/72 | HR 60 | Temp 97.9°F | Ht 60.0 in | Wt 125.0 lb

## 2019-10-22 DIAGNOSIS — R0602 Shortness of breath: Secondary | ICD-10-CM

## 2019-10-22 DIAGNOSIS — Z9189 Other specified personal risk factors, not elsewhere classified: Secondary | ICD-10-CM

## 2019-10-22 DIAGNOSIS — J309 Allergic rhinitis, unspecified: Secondary | ICD-10-CM | POA: Diagnosis not present

## 2019-10-22 LAB — PULMONARY FUNCTION TEST
DL/VA % pred: 71 %
DL/VA: 3.03 ml/min/mmHg/L
DLCO cor % pred: 68 %
DLCO cor: 11.62 ml/min/mmHg
DLCO unc % pred: 68 %
DLCO unc: 11.62 ml/min/mmHg
FEF 25-75 Post: 3.74 L/sec
FEF 25-75 Pre: 2.83 L/sec
FEF2575-%Change-Post: 32 %
FEF2575-%Pred-Post: 229 %
FEF2575-%Pred-Pre: 174 %
FEV1-%Change-Post: 1 %
FEV1-%Pred-Post: 111 %
FEV1-%Pred-Pre: 109 %
FEV1-Post: 2.09 L
FEV1-Pre: 2.05 L
FEV1FVC-%Change-Post: 0 %
FEV1FVC-%Pred-Pre: 115 %
FEV6-%Change-Post: 1 %
FEV6-%Pred-Post: 100 %
FEV6-%Pred-Pre: 98 %
FEV6-Post: 2.39 L
FEV6-Pre: 2.34 L
FEV6FVC-%Pred-Post: 105 %
FEV6FVC-%Pred-Pre: 105 %
FVC-%Change-Post: 1 %
FVC-%Pred-Post: 95 %
FVC-%Pred-Pre: 94 %
FVC-Post: 2.39 L
FVC-Pre: 2.34 L
Post FEV1/FVC ratio: 88 %
Post FEV6/FVC ratio: 100 %
Pre FEV1/FVC ratio: 88 %
Pre FEV6/FVC Ratio: 100 %
RV % pred: 72 %
RV: 1.47 L
TLC % pred: 89 %
TLC: 3.97 L

## 2019-10-22 NOTE — Assessment & Plan Note (Signed)
Patient reports previous history of severe obstructive sleep apnea She is unsure when she was tested  Plan: Home sleep study ordered today We will have patient set up 2 to 50-month sleep consult with a sleep MD

## 2019-10-22 NOTE — Progress Notes (Signed)
PFT done today. 

## 2019-10-22 NOTE — Assessment & Plan Note (Signed)
Increased sinus drainage   Plan: Patient could start daily antihistamine Can also start nasal saline rinses

## 2019-10-22 NOTE — Assessment & Plan Note (Signed)
Reviewed pulmonary function testing today with patient Most recent chest x-ray in March/2021 clear Benign exam Walk today in office, oxygen level stable on room air  Plan: We will further evaluate patient's shortness of breath with an echocardiogram We will also recommend retesting for obstructive sleep apnea Follow-up with our office in 3 months Discussed case with Dr. Valeta Harms, given patient stable chest x-ray as well as stable pulmonary function test and walk we will hold off on repeating CT of chest at this time.

## 2019-10-22 NOTE — Patient Instructions (Addendum)
You were seen today by Lauraine Rinne, NP  for:   1. At risk for obstructive sleep apnea  - Home sleep test; Future  2. SOB (shortness of breath)  - ECHOCARDIOGRAM COMPLETE; Future   We recommend today:  Orders Placed This Encounter  Procedures  . ECHOCARDIOGRAM COMPLETE    Standing Status:   Future    Standing Expiration Date:   04/23/2020    Order Specific Question:   Where should this test be performed    Answer:   [facility])    Order Specific Question:   Does the patient weigh less than or greater than 250 lbs?    Answer:   Patient weighs less than 250 lbs    Order Specific Question:   Perflutren DEFINITY (image enhancing agent) should be administered unless hypersensitivity or allergy exist    Answer:   Administer Perflutren    Order Specific Question:   Reason for exam-Echo    Answer:   Dyspnea  786.09 / R06.00    Order Specific Question:   Release to patient    Answer:   Immediate  . Home sleep test    Standing Status:   Future    Standing Expiration Date:   10/21/2020    Order Specific Question:   Where should this test be performed:    Answer:   [facility] - Pulmonary   Orders Placed This Encounter  Procedures  . ECHOCARDIOGRAM COMPLETE  . Home sleep test   No orders of the defined types were placed in this encounter.   Follow Up:    Return in about 3 months (around 01/22/2020), or if symptoms worsen or fail to improve, for Follow up with Dr. Valeta Harms.  Please schedule sleep consult with sleep MD and 30-minute time slot in 2 to 3 months   Please do your part to reduce the spread of COVID-19:      Reduce your risk of any infection  and COVID19 by using the similar precautions used for avoiding the common cold or flu:  Marland Kitchen Wash your hands often with soap and warm water for at least 20 seconds.  If soap and water are not readily available, use an alcohol-based hand sanitizer with at least 60% alcohol.  . If coughing or sneezing, cover your mouth and  nose by coughing or sneezing into the elbow areas of your shirt or coat, into a tissue or into your sleeve (not your hands). Langley Gauss A MASK when in public  . Avoid shaking hands with others and consider head nods or verbal greetings only. . Avoid touching your eyes, nose, or mouth with unwashed hands.  . Avoid close contact with people who are sick. . Avoid places or events with large numbers of people in one location, like concerts or sporting events. . If you have some symptoms but not all symptoms, continue to monitor at home and seek medical attention if your symptoms worsen. . If you are having a medical emergency, call 911.   [place] / e-Visit: [email or web address]         [facility]: [facility]: [phone]                   [facility]: [phone]     It is flu season:   >>> Best ways to protect herself from the flu: Receive the yearly flu vaccine, practice good  hand hygiene washing with soap and also using hand sanitizer when available, eat a nutritious meals, get adequate rest, hydrate appropriately   Please contact the office if your symptoms worsen or you have concerns that you are not improving.   Thank you for choosing [facility] for your healthcare, and for allowing [place] to partner with you on your healthcare journey. I am thankful to be able to provide care to you today.   Wyn Quaker FNP-C

## 2019-10-22 NOTE — Progress Notes (Signed)
@Patient  ID: Heather Espinoza, female    DOB: Aug 26, 1945, 74 y.o.   MRN: [id number]  Chief Complaint  Patient presents with  . Follow-up    go over pft results    Referring provider: London Pepper, MD  HPI:  74 year old female never smoker initially referred to our office for shortness of breath in March/2021  PMH: Hypertension, high cholesterol, fibromyalgia, IBS, GERD, depression Smoker/ Smoking History: Never smoker Maintenance: None Pt of: Dr. Valeta Harms  10/22/2019  - Visit   74 year old female never smoker initially referred to our office in March/2021 for evaluation of shortness of breath.  Patient establish care with Dr. Valeta Harms at that time.  Plan of care from that office visit was to order pulmonary function testing, a chest x-ray, if any persistent abnormalities to order a CT of chest.  Patient reporting no changes in her breathing since last being seen.  She still has shortness of breath with exertion.  She is here to review her pulmonary function testing results.  Those are listed below:  10/22/2019-pulmonary function test-FVC 2.34 (94% predicted), postbronchodilator ratio 88, postbronchodilator FEV1 2.09 (111% predicted), no bronchodilator response, DLCO 11.62 (68% predicted)  Patient reports that she has a previous history of severe obstructive sleep apnea.  She is not currently treated with CPAP therapy.  She reports that she has an allergy to latex.  She has never been able to find a mask that she can utilize.  She is unsure who tested her.  She suspects this was her old primary care doctor Dr. Brigitte Pulse in [place], [place].  She has now established with Dr. Orland Mustard.  Patient also reports that Dr. Brigitte Pulse had previously documented that she had PACs and PVCs.  Last echocardiogram was in 2019.  She is not established with a cardiologist at this time.  Patient does report significant sinus congestion and drainage.  We will discuss this today.  Questionaires / Pulmonary Flowsheets:    ACT:  No flowsheet data found.  MMRC: No flowsheet data found.  Epworth:  No flowsheet data found.  Tests:   05/20/2019-alpha-1 antitrypsin deficiency-87, alpha-1 antitrypsin phenotype is PI*MZ   05/20/2019-CBC with differential-hemoglobin 10.9, eosinophils relative 6.3, eosinophils absolute 0.5  05/20/2019-chest x-ray-no acute cardiopulmonary process  FENO:  No results found for: NITRICOXIDE  PFT: PFT Results Latest Ref Rng & Units 10/22/2019  FVC-Pre L 2.34  FVC-Predicted Pre % 94  FVC-Post L 2.39  FVC-Predicted Post % 95  Pre FEV1/FVC % % 88  Post FEV1/FCV % % 88  FEV1-Pre L 2.05  FEV1-Predicted Pre % 109  FEV1-Post L 2.09  DLCO uncorrected ml/min/mmHg 11.62  DLCO UNC% % 68  DLCO corrected ml/min/mmHg 11.62  DLCO COR %Predicted % 68  DLVA Predicted % 71  TLC L 3.97  TLC % Predicted % 89  RV % Predicted % 72    WALK:  SIX MIN WALK 10/22/2019  Tech Comments: pt walked at slow pace was able to main oxygen saturation levels all three laps    Imaging: No results found.  Lab Results:  CBC    Component Value Date/Time   WBC 7.7 05/20/2019 1454   RBC 3.58 (L) 05/20/2019 1454   HGB 10.9 Repeated and verified X2. (L) 05/20/2019 1454   HCT 32.2 (L) 05/20/2019 1454   PLT 300.0 05/20/2019 1454   MCV 90.1 05/20/2019 1454   MCH 29.7 02/05/2019 1008   MCHC 33.7 05/20/2019 1454   RDW 14.4 05/20/2019 1454   LYMPHSABS 2.4 05/20/2019 1454  MONOABS 0.8 05/20/2019 1454   EOSABS 0.5 05/20/2019 1454   BASOSABS 0.1 05/20/2019 1454    BMET    Component Value Date/Time   NA 138 05/20/2019 1454   K 4.1 05/20/2019 1454   CL 104 05/20/2019 1454   CO2 27 05/20/2019 1454   GLUCOSE 103 (H) 05/20/2019 1454   BUN 17 05/20/2019 1454   CREATININE 1.17 05/20/2019 1454   CALCIUM 9.7 05/20/2019 1454   GFRNONAA 39 (L) 02/05/2019 1008   GFRAA 45 (L) 02/05/2019 1008    BNP No results found for: BNP  ProBNP No results found for: PROBNP  Specialty Problems      Pulmonary  Problems   Lobar pneumonia (HCC)   Streptococcus pneumoniae pneumonia (HCC)   SOB (shortness of breath)      Allergies  Allergen Reactions  . Flagyl [Metronidazole] Shortness Of Breath and Swelling  . Lyrica [Pregabalin] Shortness Of Breath  . Adhesive [Tape] Other (See Comments)    Takes off patient's skin  . Aspirin Other (See Comments)    Stomach bleed  . Latex Itching and Rash  . Phenergan [Promethazine Hcl] Other (See Comments)    Caused grogginess, altered mental status  . Prednisone Anxiety  . Sulfa Antibiotics Nausea Only  . Tetanus Toxoids Swelling    Arm Area    Immunization History  Administered Date(s) Administered  . Influenza, High Dose Seasonal PF 12/07/2018  . Janssen (J&J) SARS-COV-2 Vaccination 05/22/2019  . Pneumococcal Conjugate-13 06/23/2017  . Zoster Recombinat (Shingrix) 12/26/2018    Past Medical History:  Diagnosis Date  . Arthritis   . COPD (chronic obstructive pulmonary disease) ([place])   . Diabetes ([place])   . Diverticulitis   . Diverticulosis   . Dyspnea   . Fibromyalgia   . High cholesterol   . History of kidney stones   . Hypertension   . Hypothyroid   . IBS (irritable bowel syndrome)   . Osteopenia   . Pneumonia   . Restless leg   . Sleep apnea    Doesn't use CPAP.    Marland Kitchen Tachycardia    per pt/fim  . Tremor, essential 01/07/2018    Tobacco History: Social History   Tobacco Use  Smoking Status Never Smoker  Smokeless Tobacco Never Used   Counseling given: Not Answered   Continue to not smoke  Outpatient Encounter Medications as of 10/22/2019  Medication Sig  . acetaminophen (TYLENOL) 500 MG tablet Take 1,000 mg by mouth every 6 (six) hours as needed (for pain.).  Marland Kitchen albuterol (PROVENTIL HFA;VENTOLIN HFA) 108 (90 Base) MCG/ACT inhaler Inhale 2 puffs into the lungs every 6 (six) hours as needed for wheezing or shortness of breath.  Marland Kitchen amLODipine (NORVASC) 5 MG tablet TAKE 1 & 1/2 (ONE & ONE-HALF) TABLETS BY MOUTH ONCE DAILY    . aspirin 81 MG chewable tablet Chew 1 tablet (81 mg total) by mouth at bedtime.  . dicyclomine (BENTYL) 10 MG capsule Take 1 capsule (10 mg total) by mouth 3 (three) times daily as needed for spasms. (Patient taking differently: Take 10 mg by mouth 3 (three) times daily as needed for spasms (IBS/Diverticulitis symptoms.). )  . docusate sodium (COLACE) 100 MG capsule Take 2 capsules (200 mg total) by mouth at bedtime. (Patient taking differently: Take 200 mg by mouth daily as needed (constipation.). )  . DULoxetine (CYMBALTA) 60 MG capsule Take 1 capsule by mouth twice daily (Patient taking differently: Take 60 mg by mouth in the morning and at bedtime. )  .  esomeprazole (NEXIUM) 40 MG capsule TAKE 1 CAPSULE BY MOUTH ONCE DAILY BEFORE BREAKFAST (Patient taking differently: Take 40 mg by mouth daily before breakfast. )  . famotidine (PEPCID) 40 MG tablet TAKE 1 TABLET BY MOUTH AT BEDTIME  . Fluticasone-Salmeterol (ADVAIR) 250-50 MCG/DOSE AEPB Inhale 1 puff into the lungs every 12 (twelve) hours.  Marland Kitchen levothyroxine (SYNTHROID, LEVOTHROID) 75 MCG tablet Take 37.5 mcg by mouth daily before breakfast.   . lisinopril (PRINIVIL,ZESTRIL) 10 MG tablet Take 10 mg by mouth daily.   . metFORMIN (GLUCOPHAGE-XR) 500 MG 24 hr tablet Take 500 mg by mouth at bedtime.  . polyethylene glycol powder (GLYCOLAX/MIRALAX) 17 GM/SCOOP powder Take 8.5 g by mouth daily. (Patient taking differently: Take 8.5 g by mouth daily as needed (constipation.). )  . simvastatin (ZOCOR) 20 MG tablet Take 20 mg by mouth every evening.  . traMADol (ULTRAM) 50 MG tablet Take 50-100 mg by mouth every 6 (six) hours as needed (back pain.).   Marland Kitchen traZODone (DESYREL) 100 MG tablet Take 100 mg by mouth at bedtime.   . [DISCONTINUED] cholecalciferol (VITAMIN D) 1000 units tablet Take 1,000 Units by mouth daily.   . [DISCONTINUED] ondansetron (ZOFRAN) 4 MG tablet TAKE 1 TABLET BY MOUTH TWICE DAILY AS NEEDED FOR NAUSEA AND/OR VOMITING  . [DISCONTINUED]  Cyanocobalamin (B-12) 2500 MCG SUBL Take 2,500 mcg by mouth daily.   . [DISCONTINUED] propranolol (INDERAL) 20 MG tablet Take 1 tablet (20 mg total) by mouth 2 (two) times daily.  . [DISCONTINUED] vitamin E 400 UNIT capsule Take 400 Units by mouth daily.   No facility-administered encounter medications on file as of 10/22/2019.     Review of Systems  Review of Systems  Constitutional: Positive for fatigue. Negative for activity change and fever.  HENT: Positive for congestion, postnasal drip, sinus pressure and sinus pain. Negative for sore throat.   Respiratory: Positive for shortness of breath. Negative for cough and wheezing.   Cardiovascular: Negative for chest pain and palpitations.  Gastrointestinal: Negative for diarrhea, nausea and vomiting.  Musculoskeletal: Negative for arthralgias.  Neurological: Negative for dizziness.  Psychiatric/Behavioral: Negative for sleep disturbance. The patient is not nervous/anxious.      Physical Exam  BP 116/72 (BP Location: Left Arm, Cuff Size: Normal)   Pulse 60   Temp 97.9 F (36.6 C) (Oral)   Ht 5' (1.524 m)   Wt 125 lb (56.7 kg)   SpO2 99%   BMI 24.41 kg/m   Wt Readings from Last 5 Encounters:  10/22/19 125 lb (56.7 kg)  07/24/19 123 lb (55.8 kg)  06/30/19 124 lb 14.4 oz (56.7 kg)  05/20/19 124 lb (56.2 kg)  03/17/19 127 lb (57.6 kg)    BMI Readings from Last 5 Encounters:  10/22/19 24.41 kg/m  07/24/19 22.86 kg/m  06/30/19 23.22 kg/m  05/20/19 23.43 kg/m  03/17/19 23.61 kg/m     Physical Exam Vitals and nursing note reviewed.  Constitutional:      General: She is not in acute distress.    Appearance: Normal appearance. She is normal weight.  HENT:     Head: Normocephalic and atraumatic.     Right Ear: Tympanic membrane, ear canal and external ear normal. There is no impacted cerumen.     Left Ear: Tympanic membrane, ear canal and external ear normal. There is no impacted cerumen.     Nose: Rhinorrhea present.  No congestion.     Mouth/Throat:     Mouth: Mucous membranes are moist.  Pharynx: Oropharynx is clear.     Comments: Postnasal drip Eyes:     Pupils: Pupils are equal, round, and reactive to light.  Cardiovascular:     Rate and Rhythm: Normal rate and regular rhythm.     Pulses: Normal pulses.     Heart sounds: Normal heart sounds. No murmur heard.   Pulmonary:     Effort: Pulmonary effort is normal. No respiratory distress.     Breath sounds: Normal breath sounds. No decreased air movement. No decreased breath sounds, wheezing or rales.  Musculoskeletal:     Cervical back: Normal range of motion.  Skin:    General: Skin is warm and dry.     Capillary Refill: Capillary refill takes less than 2 seconds.  Neurological:     General: No focal deficit present.     Mental Status: She is alert and oriented to person, place, and time. Mental status is at baseline.     Gait: Gait (Tolerated walk in office) normal.  Psychiatric:        Mood and Affect: Mood normal.        Behavior: Behavior normal.        Thought Content: Thought content normal.        Judgment: Judgment normal.       Assessment & Plan:   SOB (shortness of breath) Reviewed pulmonary function testing today with patient Most recent chest x-ray in March/2021 clear Benign exam Walk today in office, oxygen level stable on room air  Plan: We will further evaluate patient's shortness of breath with an echocardiogram We will also recommend retesting for obstructive sleep apnea Follow-up with our office in 3 months Discussed case with Dr. Valeta Harms, given patient stable chest x-ray as well as stable pulmonary function test and walk we will hold off on repeating CT of chest at this time.  At risk for obstructive sleep apnea Patient reports previous history of severe obstructive sleep apnea She is unsure when she was tested  Plan: Home sleep study ordered today We will have patient set up 2 to 73-month sleep consult  with a sleep MD    Return in about 3 months (around 01/22/2020), or if symptoms worsen or fail to improve, for Follow up with Dr. Valeta Harms.   Lauraine Rinne, NP 10/22/2019   This appointment required 32 minutes of patient care (this includes precharting, chart review, review of results, face-to-face care, etc.).

## 2019-10-23 DIAGNOSIS — M545 Low back pain: Secondary | ICD-10-CM | POA: Diagnosis not present

## 2019-10-23 DIAGNOSIS — M4316 Spondylolisthesis, lumbar region: Secondary | ICD-10-CM | POA: Diagnosis not present

## 2019-10-28 ENCOUNTER — Ambulatory Visit (HOSPITAL_COMMUNITY): Payer: Medicare Other

## 2019-11-04 ENCOUNTER — Ambulatory Visit (HOSPITAL_COMMUNITY): Payer: Medicare Other

## 2019-11-12 ENCOUNTER — Ambulatory Visit (HOSPITAL_COMMUNITY)
Admission: RE | Admit: 2019-11-12 | Discharge: 2019-11-12 | Disposition: A | Discharge status: Home / Self Care | Payer: Medicare Other | Source: Ambulatory Visit | Attending: Pulmonary Disease | Admitting: Pulmonary Disease

## 2019-11-12 ENCOUNTER — Other Ambulatory Visit: Payer: Self-pay

## 2019-11-12 DIAGNOSIS — R0602 Shortness of breath: Secondary | ICD-10-CM

## 2019-11-12 LAB — ECHOCARDIOGRAM COMPLETE
AR max vel: 1.93 cm2
AV Area VTI: 2.36 cm2
AV Area mean vel: 1.86 cm2
AV Mean grad: 3.7 mmHg
AV Peak grad: 8.1 mmHg
Ao pk vel: 1.42 m/s
Area-P 1/2: 3.51 cm2
S' Lateral: 3.03 cm

## 2019-11-12 NOTE — Progress Notes (Signed)
*  PRELIMINARY RESULTS* Echocardiogram 2D Echocardiogram has been performed.  Leavy Cella 11/12/2019, 1:43 PM

## 2019-11-12 NOTE — Progress Notes (Signed)
Called and spoke to patient about Echo results per Wyn Quaker NP. All questions answered and patient expressed full understanding. Nothing further needed at this time.

## 2019-11-13 ENCOUNTER — Telehealth: Payer: Self-pay | Admitting: Pulmonary Disease

## 2019-11-13 NOTE — Telephone Encounter (Signed)
I called pt & rescheduled appt.  Nothing further needed.

## 2019-11-17 ENCOUNTER — Other Ambulatory Visit (INDEPENDENT_AMBULATORY_CARE_PROVIDER_SITE_OTHER): Payer: Self-pay | Admitting: Gastroenterology

## 2019-12-15 ENCOUNTER — Other Ambulatory Visit (INDEPENDENT_AMBULATORY_CARE_PROVIDER_SITE_OTHER): Payer: Self-pay | Admitting: Gastroenterology

## 2019-12-28 ENCOUNTER — Other Ambulatory Visit (HOSPITAL_COMMUNITY): Payer: Self-pay | Admitting: Family Medicine

## 2019-12-28 DIAGNOSIS — Z1231 Encounter for screening mammogram for malignant neoplasm of breast: Secondary | ICD-10-CM

## 2019-12-29 ENCOUNTER — Other Ambulatory Visit (HOSPITAL_COMMUNITY): Payer: Self-pay | Admitting: Family Medicine

## 2019-12-29 DIAGNOSIS — N63 Unspecified lump in unspecified breast: Secondary | ICD-10-CM

## 2019-12-31 ENCOUNTER — Other Ambulatory Visit (INDEPENDENT_AMBULATORY_CARE_PROVIDER_SITE_OTHER): Payer: Self-pay | Admitting: Gastroenterology

## 2019-12-31 MED ORDER — ONDANSETRON HCL 4 MG PO TABS: ORAL_TABLET | ORAL | 1 refills | Status: DC

## 2019-12-31 NOTE — Progress Notes (Signed)
Refill for zofran sent to pharmacy per request.

## 2020-01-05 ENCOUNTER — Other Ambulatory Visit (INDEPENDENT_AMBULATORY_CARE_PROVIDER_SITE_OTHER): Payer: Self-pay | Admitting: *Deleted

## 2020-01-05 ENCOUNTER — Ambulatory Visit (INDEPENDENT_AMBULATORY_CARE_PROVIDER_SITE_OTHER): Payer: PRIVATE HEALTH INSURANCE | Admitting: Internal Medicine

## 2020-01-05 ENCOUNTER — Telehealth (INDEPENDENT_AMBULATORY_CARE_PROVIDER_SITE_OTHER): Payer: Self-pay | Admitting: *Deleted

## 2020-01-05 ENCOUNTER — Telehealth (INDEPENDENT_AMBULATORY_CARE_PROVIDER_SITE_OTHER): Payer: Self-pay | Admitting: Gastroenterology

## 2020-01-05 DIAGNOSIS — R1319 Other dysphagia: Secondary | ICD-10-CM

## 2020-01-05 DIAGNOSIS — K219 Gastro-esophageal reflux disease without esophagitis: Secondary | ICD-10-CM

## 2020-01-05 MED ORDER — ESOMEPRAZOLE MAGNESIUM 40 MG PO CPDR: DELAYED_RELEASE_CAPSULE | ORAL | 5 refills | Status: DC

## 2020-01-05 NOTE — Telephone Encounter (Signed)
Patient was a no show for today's visit with Dr.Rehman- her car would not start. She will make another appointment, a telephone visit was offered but she declined.

## 2020-01-05 NOTE — Telephone Encounter (Signed)
Patient was called and made aware that the Zofran was filled on 12/31/2019.  Patient has appointment today but was unable to come due to her car not startinng. She will call and make another appointment. A telephone visit was offered but patient declined.

## 2020-01-05 NOTE — Telephone Encounter (Signed)
Patient called regarding a refill on her Zofran - stated the request had been sent in about a month ago - please advise - ph# [phone]

## 2020-01-27 NOTE — Progress Notes (Signed)
01/28/20- [name] never smoker from [place], [place] for sleep evaluation Had seen Dr Valeta Harms for Dyspnea/ COPD, fam hx a1ATdef( MZ 87). Referred now by Warner Mccreedy, NP with suspected OSA. HST ordered but not done.  Medical problem list includes HTN, IBS, GERD, Hypothyroid, DM, Tremor, CKD3, Hypercholesterolemia,  Body weight today- 122 lbs Epworth- 4 Covid vax- J&J Flu vax- today senior Sleep study 2005 at [facility]. CPAP then for a month, but face broke out from mask Since then has lost 35 lbs. Husband, truck driver away a lot, used to tell her that she snored. She minimizes daytime tiredness and snoring now, but some concern about breathing/ oxygen at night. ENT surgery + tonsils. Esophageal dilatation x 2.  Prior to Admission medications   Medication Sig Start Date End Date Taking? Authorizing Provider  acetaminophen (TYLENOL) 500 MG tablet Take 1,000 mg by mouth every 6 (six) hours as needed (for pain.).   Yes [provider]  albuterol (PROVENTIL HFA;VENTOLIN HFA) 108 (90 Base) MCG/ACT inhaler Inhale 2 puffs into the lungs every 6 (six) hours as needed for wheezing or shortness of breath.   Yes [provider]  aspirin 81 MG chewable tablet Chew 1 tablet (81 mg total) by mouth at bedtime. 07/27/19  Yes Rehman, Mechele Dawley, MD  dicyclomine (BENTYL) 10 MG capsule Take 1 capsule (10 mg total) by mouth 3 (three) times daily as needed for spasms. Patient taking differently: Take 10 mg by mouth 3 (three) times daily as needed for spasms (IBS/Diverticulitis symptoms.).  03/17/19  Yes Rehman, Mechele Dawley, MD  docusate sodium (COLACE) 100 MG capsule Take 2 capsules (200 mg total) by mouth at bedtime. Patient taking differently: Take 200 mg by mouth daily as needed (constipation.).  01/04/15  Yes Rehman, Mechele Dawley, MD  famotidine (PEPCID) 40 MG tablet TAKE 1 TABLET BY MOUTH AT BEDTIME 08/14/19  Yes Rehman, Mechele Dawley, MD  Fluticasone-Salmeterol (ADVAIR) 250-50 MCG/DOSE AEPB Inhale 1 puff into the  lungs every 12 (twelve) hours.   Yes [provider]  levothyroxine (SYNTHROID, LEVOTHROID) 75 MCG tablet Take 37.5 mcg by mouth daily before breakfast.    Yes [provider]  lisinopril (PRINIVIL,ZESTRIL) 10 MG tablet Take 10 mg by mouth daily.    Yes [provider]  metFORMIN (GLUCOPHAGE-XR) 500 MG 24 hr tablet Take 500 mg by mouth at bedtime. 01/12/19  Yes [provider]  ondansetron (ZOFRAN) 4 MG tablet Take 1 tablet PO BID PRN Nausea/vomiting 12/31/19  Yes Laurine Blazer A, PA-C  polyethylene glycol powder (GLYCOLAX/MIRALAX) 17 GM/SCOOP powder Take 8.5 g by mouth daily. Patient taking differently: Take 8.5 g by mouth daily as needed (constipation.).  06/30/19  Yes Rehman, Mechele Dawley, MD  simvastatin (ZOCOR) 20 MG tablet Take 20 mg by mouth every evening.   Yes [provider]  traMADol (ULTRAM) 50 MG tablet Take 50-100 mg by mouth every 6 (six) hours as needed (back pain.).  06/13/19  Yes [provider]  traZODone (DESYREL) 100 MG tablet Take 100 mg by mouth at bedtime.    Yes [provider]   Past Medical History:  Diagnosis Date  . Arthritis   . COPD (chronic obstructive pulmonary disease) ([place])   . Diabetes ([place])   . Diverticulitis   . Diverticulosis   . Dyspnea   . Fibromyalgia   . High cholesterol   . History of kidney stones   . Hypertension   . Hypothyroid   . IBS (irritable bowel syndrome)   .  Osteopenia   . Pneumonia   . Restless leg   . Sleep apnea    Doesn't use CPAP.    Marland Kitchen Tachycardia    per pt/fim  . Tremor, essential 01/07/2018   Past Surgical History:  Procedure Laterality Date  . APPENDECTOMY    . BIOPSY  12/25/2016   Procedure: BIOPSY;  Surgeon: Rogene Houston, MD;  [facility];  Service: Endoscopy;;  gastric   . BREAST LUMPECTOMY Right 2001  . CATARACT EXTRACTION Bilateral 2016  . CHOLECYSTECTOMY    . COLONOSCOPY N/A 12/17/2012   Procedure: COLONOSCOPY;  Surgeon: Rogene Houston, MD;  [facility]: [facility];  Service: Endoscopy;  Laterality: N/A;  215  . COLONOSCOPY WITH PROPOFOL N/A 07/24/2019   Procedure: COLONOSCOPY WITH PROPOFOL;  Surgeon: Rogene Houston, MD;  [facility];  Service: Endoscopy;  Laterality: N/A;  730  . complete hysterectomy    . ESOPHAGEAL DILATION N/A 12/25/2016   Procedure: ESOPHAGEAL DILATION;  Surgeon: Rogene Houston, MD;  [facility];  Service: Endoscopy;  Laterality: N/A;  . ESOPHAGEAL DILATION N/A 07/24/2019   Procedure: ESOPHAGEAL DILATION;  Surgeon: Rogene Houston, MD;  [facility];  Service: Endoscopy;  Laterality: N/A;  . ESOPHAGOGASTRODUODENOSCOPY N/A 12/25/2016   Procedure: ESOPHAGOGASTRODUODENOSCOPY (EGD);  Surgeon: Rogene Houston, MD;  [facility];  Service: Endoscopy;  Laterality: N/A;  730  . ESOPHAGOGASTRODUODENOSCOPY (EGD) WITH PROPOFOL N/A 07/24/2019   Procedure: ESOPHAGOGASTRODUODENOSCOPY (EGD) WITH PROPOFOL;  Surgeon: Rogene Houston, MD;  [facility];  Service: Endoscopy;  Laterality: N/A;  . Heel tumor removed    . POLYPECTOMY  07/24/2019   Procedure: POLYPECTOMY;  Surgeon: Rogene Houston, MD;  [facility];  Service: Endoscopy;;  . rt elbow surgery    . TONSILLECTOMY     Family History  Problem Relation Age of Onset  . Uterine cancer Mother   . Parkinson's disease Father   . Stroke Father   . Colon cancer Brother   . Lung cancer Brother   . COPD Sister   . Diabetes Grandchild   . Asthma Son    Social History   Socioeconomic History  . Marital status: Married    Spouse name: Not on file  . Number of children: 2  . Years of education: College  . Highest education level: Not on file  Occupational History  . Occupation: Retired  Tobacco Use  . Smoking status: Never Smoker  . Smokeless tobacco: Never Used  Vaping Use  . Vaping Use: Never used  Substance and Sexual Activity  . Alcohol use: No    Alcohol/week: 0.0 standard  drinks  . Drug use: No  . Sexual activity: Not on file  Other Topics Concern  . Not on file  Social History Narrative   Lives at home w/ her husband   Right-handed   Occasional caffeine: decaf tea, diet sodas   Social Determinants of Health   Financial Resource Strain:   . Difficulty of Paying Living Expenses: Not on file  Food Insecurity:   . Worried About Charity fundraiser in the Last Year: Not on file  . Ran Out of Food in the Last Year: Not on file  Transportation Needs:   . Lack of Transportation (Medical): Not on file  . Lack of Transportation (Non-Medical): Not on file  Physical Activity:   . Days of Exercise per Week: Not on file  . Minutes  of Exercise per Session: Not on file  Stress:   . Feeling of Stress : Not on file  Social Connections:   . Frequency of Communication with Friends and Family: Not on file  . Frequency of Social Gatherings with Friends and Family: Not on file  . Attends Religious Services: Not on file  . Active Member of Clubs or Organizations: Not on file  . Attends Archivist Meetings: Not on file  . Marital Status: Not on file  Intimate Partner Violence:   . Fear of Current or Ex-Partner: Not on file  . Emotionally Abused: Not on file  . Physically Abused: Not on file  . Sexually Abused: Not on file   ROS-see HPI   + = positive Constitutional:   + weight loss, night sweats, fevers, chills, fatigue, lassitude. HEENT:    +headaches, +difficulty swallowing, +tooth/dental problems, sore throat,       +sneezing, itching, +ear ache, nasal congestion, post nasal drip, snoring CV:    chest pain, orthopnea, PND, swelling in lower extremities, anasarca,                                  dizziness, palpitations Resp:   +shortness of breath with exertion or at rest.                productive cough,   non-productive cough, coughing up of blood.              change in color of mucus.  wheezing.   Skin:    +rash or lesions. GI:  + heartburn,  indigestion, +abdominal pain, nausea, vomiting, diarrhea,                 change in bowel habits, +loss of appetite GU: dysuria, change in color of urine, no urgency or frequency.   flank pain. MS:  + joint pain, stiffness, decreased range of motion, back pain. Neuro-     nothing unusual Psych:  change in mood or affect.  +depression or +anxiety.   memory loss.  OBJ- Physical Exam General- Alert, Oriented, Affect-appropriate, Distress- none acute Skin- rash-none, lesions- none, excoriation- none Lymphadenopathy- none Head- atraumatic            Eyes- Gross vision intact, PERRLA, conjunctivae and secretions clear            Ears- Hearing, canals-normal            Nose- Clear, no-Septal dev, mucus, polyps, erosion, perforation             Throat- Mallampati III, mucosa clear , drainage- none, tonsils- absent, +teeth Neck- flexible , trachea midline, no stridor , thyroid nl, carotid no bruit Chest - symmetrical excursion , unlabored           Heart/CV- RRR , no murmur , no gallop  , no rub, nl s1 s2                           - JVD- none , edema- none, stasis changes- none, varices- none           Lung- clear to P&A, wheeze- none, cough- none , dullness-none, rub- none           Chest wall-  Abd-  Br/ Gen/ Rectal- Not done, not indicated Extrem- cyanosis- none, clubbing, none, atrophy- none, strength- nl Neuro- grossly intact to observation

## 2020-01-28 ENCOUNTER — Encounter: Payer: Self-pay | Admitting: Internal Medicine

## 2020-01-28 ENCOUNTER — Ambulatory Visit (INDEPENDENT_AMBULATORY_CARE_PROVIDER_SITE_OTHER): Payer: Medicare Other | Admitting: Internal Medicine

## 2020-01-28 ENCOUNTER — Other Ambulatory Visit: Payer: Self-pay

## 2020-01-28 VITALS — BP 116/70 | HR 72 | Temp 97.3°F | Ht 61.0 in | Wt 122.6 lb

## 2020-01-28 DIAGNOSIS — R0602 Shortness of breath: Secondary | ICD-10-CM | POA: Diagnosis not present

## 2020-01-28 DIAGNOSIS — R0683 Snoring: Secondary | ICD-10-CM | POA: Diagnosis not present

## 2020-01-28 DIAGNOSIS — G4733 Obstructive sleep apnea (adult) (pediatric): Secondary | ICD-10-CM | POA: Diagnosis not present

## 2020-01-28 DIAGNOSIS — Z23 Encounter for immunization: Secondary | ICD-10-CM | POA: Diagnosis not present

## 2020-01-28 NOTE — Patient Instructions (Addendum)
Order- schedule home sleep test. Ok to arrange through [facility], for me to read. Patient lives in [place], [place]. Dx OSA  Please call us about 2 weeks after your sleep study is done, to see if results and recommendations are ready yet. If appropriate, we may be able to start treatment before we see you next.  Order- Flu vax senior  Order- Pneumovax 24

## 2020-01-29 DIAGNOSIS — R0683 Snoring: Secondary | ICD-10-CM | POA: Insufficient documentation

## 2020-01-29 NOTE — Assessment & Plan Note (Signed)
Has seen Dr Valeta Harms for dyspnea and COPD evaluation, minimizing these today. Marland Kitchen

## 2020-01-29 NOTE — Assessment & Plan Note (Signed)
She has lost weight since original study. Appropriate to check for OSA and check oxygenation with sleep study. Lives 2 hours from here in [place], [place] we can try to get this done through [facility].

## 2020-02-02 ENCOUNTER — Other Ambulatory Visit: Payer: Self-pay

## 2020-02-02 ENCOUNTER — Ambulatory Visit (HOSPITAL_COMMUNITY)
Admission: RE | Admit: 2020-02-02 | Discharge: 2020-02-02 | Disposition: A | Discharge status: Home / Self Care | Payer: Medicare Other | Source: Ambulatory Visit | Attending: Family Medicine | Admitting: Family Medicine

## 2020-02-02 DIAGNOSIS — R928 Other abnormal and inconclusive findings on diagnostic imaging of breast: Secondary | ICD-10-CM | POA: Diagnosis not present

## 2020-02-02 DIAGNOSIS — N63 Unspecified lump in unspecified breast: Secondary | ICD-10-CM | POA: Diagnosis not present

## 2020-02-02 DIAGNOSIS — N6081 Other benign mammary dysplasias of right breast: Secondary | ICD-10-CM | POA: Diagnosis not present

## 2020-02-26 DIAGNOSIS — G9519 Other vascular myelopathies: Secondary | ICD-10-CM | POA: Diagnosis not present

## 2020-02-26 DIAGNOSIS — R030 Elevated blood-pressure reading, without diagnosis of hypertension: Secondary | ICD-10-CM | POA: Diagnosis not present

## 2020-03-16 ENCOUNTER — Telehealth: Payer: Self-pay

## 2020-03-16 ENCOUNTER — Other Ambulatory Visit: Payer: Self-pay | Admitting: Neurosurgery

## 2020-03-16 DIAGNOSIS — G9519 Other vascular myelopathies: Secondary | ICD-10-CM

## 2020-03-16 NOTE — Telephone Encounter (Signed)
Phone call to patient to verify medication list and allergies for myelogram procedure. Pt instructed to hold Tramadol and Trazodone for 48hrs prior to myelogram appointment time and 24 hours after appointment. Pt also instructed to have a driver the day of the procedure, the procedure would take around 2 hours, and discharge instructions discussed. Pt verbalized understanding.

## 2020-03-29 ENCOUNTER — Telehealth (INDEPENDENT_AMBULATORY_CARE_PROVIDER_SITE_OTHER): Payer: Medicare Other | Admitting: Internal Medicine

## 2020-03-29 ENCOUNTER — Other Ambulatory Visit: Payer: Self-pay

## 2020-03-29 ENCOUNTER — Encounter (INDEPENDENT_AMBULATORY_CARE_PROVIDER_SITE_OTHER): Payer: Self-pay | Admitting: Internal Medicine

## 2020-03-29 VITALS — BP 121/61 | HR 69 | Ht 61.5 in | Wt 120.0 lb

## 2020-03-29 DIAGNOSIS — K5900 Constipation, unspecified: Secondary | ICD-10-CM | POA: Diagnosis not present

## 2020-03-29 DIAGNOSIS — K219 Gastro-esophageal reflux disease without esophagitis: Secondary | ICD-10-CM | POA: Diagnosis not present

## 2020-03-29 DIAGNOSIS — K589 Irritable bowel syndrome without diarrhea: Secondary | ICD-10-CM

## 2020-03-29 DIAGNOSIS — R110 Nausea: Secondary | ICD-10-CM

## 2020-03-29 MED ORDER — ONDANSETRON HCL 4 MG PO TABS: 4.0000 mg | ORAL_TABLET | Freq: Two times a day (BID) | ORAL | 1 refills | Status: DC | PRN

## 2020-03-29 MED ORDER — ESOMEPRAZOLE MAGNESIUM 40 MG PO CPDR: 40.0000 mg | DELAYED_RELEASE_CAPSULE | Freq: Every day | ORAL | 5 refills | Status: DC

## 2020-03-29 NOTE — Progress Notes (Signed)
Virtual Visit via Telephone Note  I connected with Heather Espinoza on 03/29/20 at  2:00 PM EST by telephone and verified that I am speaking with the correct person using two identifiers.  Location: Patient: home Provider: office   I discussed the limitations, risks, security and privacy concerns of performing an evaluation and management service by telephone and the availability of in person appointments. I also discussed with the patient that there may be a patient responsible charge related to this service. The patient expressed understanding and agreed to proceed.   History of Present Illness:  Patient is 75 year old Caucasian female who had scheduled visit for today but patient requested that it be changed to virtual visit.  Video visit was not possible therefore telephone visit was conducted. Patient says she fell yesterday and feels sore.  She did not feel like coming to the office today but she did not want to proceed with virtual visit. Her last office visit was on 06/30/2019. She had EGD with EGD and colonoscopy in May 2021.  She says she is doing well as far as GERD is concerned.  Heartburn is well controlled with therapy.  She may have dysphagia occasionally but she has not had food impaction since her EGD ED of May 2021.  She was found to have esophageal valve.  She remains with daily nausea without vomiting.  She is using ondansetron virtually every day.  She needs a new refill.  Her bowels move 1-3 times daily.  She is taking polyethylene glycol every other day.  She denies melena or rectal bleeding.  She says her appetite is not good.  However she is trying to eat more foods and solids.  Her weight is down by 4 pounds since her last visit. Patient states she does not take Tylenol every day.  She is taking dicyclomine for pain across upper abdomen couple of times a month.  Similarly she does not take tramadol often.  Tramadol is for fibromyalgia. Patient says she is having worsening  back pain.  She is scheduled for spinal tap and myelogram.  She is wondering if she is passed the clips which were used at polypectomy site.  She does not need to worry about having these studies.    Current Outpatient Medications:  .  acetaminophen (TYLENOL) 500 MG tablet, Take 1,000 mg by mouth every 6 (six) hours as needed (for pain.)., Disp: , Rfl:  .  albuterol (PROVENTIL HFA;VENTOLIN HFA) 108 (90 Base) MCG/ACT inhaler, Inhale 2 puffs into the lungs every 6 (six) hours as needed for wheezing or shortness of breath., Disp: , Rfl:  .  Aspirin 81 MG CAPS, Take 81 mg by mouth at bedtime. Enteric Coated, Disp:  , Rfl:  .  dicyclomine (BENTYL) 10 MG capsule, Take 1 capsule (10 mg total) by mouth 3 (three) times daily as needed for spasms. (Patient taking differently: Take 10 mg by mouth 3 (three) times daily as needed for spasms (IBS/Diverticulitis symptoms.).), Disp: 90 capsule, Rfl: 5 .  docusate sodium (COLACE) 100 MG capsule, Take 2 capsules (200 mg total) by mouth at bedtime. (Patient taking differently: Take 200 mg by mouth daily as needed (constipation.).), Disp: 10 capsule, Rfl: 0 .  esomeprazole (NEXIUM) 40 MG capsule, Take 40 mg by mouth daily before breakfast., Disp: , Rfl:  .  famotidine (PEPCID) 40 MG tablet, TAKE 1 TABLET BY MOUTH AT BEDTIME, Disp: 90 tablet, Rfl: 3 .  Fluticasone-Salmeterol (ADVAIR) 250-50 MCG/DOSE AEPB, Inhale 1 puff into the lungs  every 12 (twelve) hours., Disp: , Rfl:  .  gabapentin (NEURONTIN) 100 MG capsule, Take 100 mg by mouth 3 (three) times daily., Disp: , Rfl:  .  levothyroxine (SYNTHROID, LEVOTHROID) 75 MCG tablet, Take 37.5 mcg by mouth daily before breakfast. , Disp: , Rfl:  .  lisinopril (PRINIVIL,ZESTRIL) 10 MG tablet, Take 10 mg by mouth daily. , Disp: , Rfl:  .  metFORMIN (GLUCOPHAGE-XR) 500 MG 24 hr tablet, Take 500 mg by mouth at bedtime., Disp: , Rfl:  .  ondansetron (ZOFRAN) 4 MG tablet, TAKE 1 TABLET BY MOUTH TWICE DAILY AS NEEDED FOR NAUSEA AND/OR  VOMITING, Disp: 30 tablet, Rfl: 1 .  polyethylene glycol powder (GLYCOLAX/MIRALAX) 17 GM/SCOOP powder, Take 8.5 g by mouth daily. (Patient taking differently: Take 8.5 g by mouth daily as needed (constipation.).), Disp: 255 g, Rfl: 5 .  simvastatin (ZOCOR) 20 MG tablet, Take 20 mg by mouth every evening., Disp: , Rfl:  .  traMADol (ULTRAM) 50 MG tablet, Take 50-100 mg by mouth every 6 (six) hours as needed (back pain.). , Disp: , Rfl:  .  traZODone (DESYREL) 100 MG tablet, Take 100 mg by mouth at bedtime., Disp: , Rfl:  Observations/Objective:  Patient reported her weight to be 120 pounds.  Assessment and Plan:  #1.  Chronic GERD.  She is doing well with therapy.  #2.  History of esophageal dysphagia felt to be due to web which was dilated disrupted back in May 2021.  We will continue to monitor for worsening dysphagia in which case further evaluation would be indicated.  #3.  Constipation.  She has a history of IBS with diarrhea and constipation but lately she has been having constipation and this may be due to her medications.  She also has intermittent abdominal pain felt to be due to IBS for which she is using dicyclomine sporadically.  #4.  Chronic nausea.  I suspect her nausea is central in origin or due to her medications.  EGD last year did not reveal peptic ulcer disease or pyloric stenosis.  If symptoms change would consider gastric emptying study.  Follow Up Instructions:  New prescription for ondansetron sent to patient's pharmacy.  New prescription for esomeprazole 40 mg p.o. every morning sent to patient's pharmacy for 1 month with 5 refills. I discussed the assessment and treatment plan with the patient. The patient was provided an opportunity to ask questions and all were answered. The patient agreed with the plan and demonstrated an understanding of the instructions.   The patient was advised to call back or seek an in-person evaluation if the symptoms worsen or if the  condition fails to improve as anticipated.  I provided 15 minutes of non-face-to-face time during this encounter.   Hildred Laser, MD

## 2020-03-31 ENCOUNTER — Ambulatory Visit
Admission: RE | Admit: 2020-03-31 | Discharge: 2020-03-31 | Disposition: A | Discharge status: Home / Self Care | Payer: Medicare Other | Source: Ambulatory Visit | Attending: Neurosurgery | Admitting: Neurosurgery

## 2020-03-31 DIAGNOSIS — G9519 Other vascular myelopathies: Secondary | ICD-10-CM

## 2020-03-31 MED ORDER — IOPAMIDOL (ISOVUE-M 200) INJECTION 41%
20.0000 mL | Freq: Once | INTRAMUSCULAR | Status: AC
  Administered 2020-03-31: 20 mL via INTRATHECAL

## 2020-03-31 MED ORDER — DIAZEPAM 5 MG PO TABS
5.0000 mg | ORAL_TABLET | Freq: Once | ORAL | Status: AC
  Administered 2020-03-31: 5 mg via ORAL

## 2020-03-31 NOTE — Discharge Instructions (Signed)
Myelogram Discharge Instructions  1. Go home and rest quietly for the next 24 hours.  It is important to lie flat for the next 24 hours.  Get up only to go to the restroom.  You may lie in the bed or on a couch on your back, your stomach, your left side or your right side.  You may have one pillow under your head.  You may have pillows between your knees while you are on your side or under your knees while you are on your back.  2. DO NOT drive today.  Recline the seat as far back as it will go, while still wearing your seat belt, on the way home.  3. You may get up to go to the bathroom as needed.  You may sit up for 10 minutes to eat.  You may resume your normal diet and medications unless otherwise indicated.  Drink lots of extra fluids today and tomorrow.  4. The incidence of headache, nausea, or vomiting is about 5% (one in 20 patients).  If you develop a headache, lie flat and drink plenty of fluids until the headache goes away.  Caffeinated beverages may be helpful.  If you develop severe nausea and vomiting or a headache that does not go away with flat bed rest, call [phone].  5. You may resume normal activities after your 24 hours of bed rest is over; however, do not exert yourself strongly or do any heavy lifting tomorrow. If when you get up you have a headache when standing, go back to bed and force fluids for another 24 hours.  6. Call your physician for a follow-up appointment.  The results of your myelogram will be sent directly to your physician by the following day.  7. If you have any questions or if complications develop after you arrive home, please call [phone].  Discharge instructions have been explained to the patient.  The patient, or the person responsible for the patient, fully understands these instructions  YOU MAY TAKE YOUR TRAZODONE AND TRAMADOL TOMORROW ON 04/01/20 @ 1PM OR THEREAFTER.

## 2020-03-31 NOTE — Progress Notes (Signed)
Pt only held tramadol and trazodone last night's dose because she was called to do this myelogram early.  She was supposed to be done next week.

## 2020-04-08 ENCOUNTER — Other Ambulatory Visit: Payer: Medicare Other

## 2020-04-12 ENCOUNTER — Other Ambulatory Visit: Payer: Self-pay | Admitting: Cardiology

## 2020-04-27 ENCOUNTER — Ambulatory Visit: Payer: Medicare Other

## 2020-04-27 ENCOUNTER — Other Ambulatory Visit: Payer: Self-pay

## 2020-04-27 DIAGNOSIS — G4733 Obstructive sleep apnea (adult) (pediatric): Secondary | ICD-10-CM | POA: Diagnosis not present

## 2020-04-27 NOTE — Progress Notes (Deleted)
HPI    01/28/20- 49 yoF never smoker from [place], [place] for sleep evaluation Had seen Dr Valeta Harms for Dyspnea/ COPD, fam hx a1ATdef( MZ 87). Referred now by Warner Mccreedy, NP with suspected OSA. HST ordered but not done.  Medical problem list includes HTN, IBS, GERD, Hypothyroid, DM, Tremor, CKD3, Hypercholesterolemia,  Body weight today- 122 lbs [place]- 4 Covid vax- J&J Flu vax- today senior Sleep study 2005 at [facility]. CPAP then for a month, but face broke out from mask Since then has lost 35 lbs. Husband, truck driver away a lot, used to tell her that she snored. She minimizes daytime tiredness and snoring now, but some concern about breathing/ oxygen at night. ENT surgery + tonsils. Esophageal dilatation x 2.  04/28/20-  HST done overnight 2/9     Need to reschedule OV  ROS-see HPI   + = positive Constitutional:   + weight loss, night sweats, fevers, chills, fatigue, lassitude. HEENT:    +headaches, +difficulty swallowing, +tooth/dental problems, sore throat,       +sneezing, itching, +ear ache, nasal congestion, post nasal drip, snoring CV:    chest pain, orthopnea, PND, swelling in lower extremities, anasarca,                                   dizziness, palpitations Resp:   +shortness of breath with exertion or at rest.                productive cough,   non-productive cough, coughing up of blood.              change in color of mucus.  wheezing.   Skin:    +rash or lesions. GI:  + heartburn, indigestion, +abdominal pain, nausea, vomiting, diarrhea,                 change in bowel habits, +loss of appetite GU: dysuria, change in color of urine, no urgency or frequency.   flank pain. MS:  + joint pain, stiffness, decreased range of motion, back pain. Neuro-     nothing unusual Psych:  change in mood or affect.  +depression or +anxiety.   memory loss.  OBJ- Physical Exam General- Alert, Oriented, Affect-appropriate, Distress- none acute Skin- rash-none, lesions- none,  excoriation- none Lymphadenopathy- none Head- atraumatic            Eyes- Gross vision intact, PERRLA, conjunctivae and secretions clear            Ears- Hearing, canals-normal            Nose- Clear, no-Septal dev, mucus, polyps, erosion, perforation             Throat- Mallampati III, mucosa clear , drainage- none, tonsils- absent, +teeth Neck- flexible , trachea midline, no stridor , thyroid nl, carotid no bruit Chest - symmetrical excursion , unlabored           Heart/CV- RRR , no murmur , no gallop  , no rub, nl s1 s2                           - JVD- none , edema- none, stasis changes- none, varices- none           Lung- clear to P&A, wheeze- none, cough- none , dullness-none, rub- none           Chest wall-  Abd-  Br/ Gen/ Rectal- Not done, not indicated Extrem- cyanosis- none, clubbing, none, atrophy- none, strength- nl Neuro- grossly intact to observation

## 2020-04-28 ENCOUNTER — Ambulatory Visit: Payer: Medicare Other | Admitting: Internal Medicine

## 2020-05-02 ENCOUNTER — Telehealth: Payer: Self-pay | Admitting: Pulmonary Disease

## 2020-05-02 DIAGNOSIS — G4733 Obstructive sleep apnea (adult) (pediatric): Secondary | ICD-10-CM

## 2020-05-02 NOTE — Telephone Encounter (Signed)
05/02/20  Please let the patient know her home sleep study shows that she had moderate obstructive sleep apnea.  Would recommend CPAP start.  We will CC Dr. Annamaria Boots who last saw the patient November/2021.  Can also offer patient earlier follow-up ointment with Dr. Annamaria Boots or an APP to further review sleep study results as well as discuss treatment.  Wyn Quaker, FNP

## 2020-05-03 NOTE — Telephone Encounter (Signed)
I called and spoke with patient regarding HST. She verbalized understanding and we made an appt to see Eric Form on 05/23/2020 to go over HST. Nothing further needed.

## 2020-05-23 ENCOUNTER — Other Ambulatory Visit (INDEPENDENT_AMBULATORY_CARE_PROVIDER_SITE_OTHER): Payer: Self-pay | Admitting: Internal Medicine

## 2020-05-23 ENCOUNTER — Ambulatory Visit: Payer: Medicare Other | Admitting: Acute Care

## 2020-05-31 NOTE — Progress Notes (Signed)
@Patient  ID: [name], female    DOB: September 30, 1945, 75 y.o.   MRN: [id number]  Chief Complaint  Patient presents with  . Follow-up    results    Referring provider: London Pepper, MD  HPI: 75 year old female, never smoked. PMH significant for OSA. Patient of Dr. Annamaria Boots, last seen in office on 01/28/20.   06/01/2020 Patient presents today to follow-up. Home sleep testing 04/27/20 showed moderate OSA, AHI 16.9 with SpO2 low 84%. She has been on CPAP in the past and did not tolerate, she has reports having an allergy to latex mask. Discussed treatment options include weight loss (not applicable), side sleeping position, oral appliance, CPAP or referral to ENT for possible surgical options. Reviewed how untreated sleep apnea can put her at a higher risk for cardiovascular disease, cardiac arrhythmias, stroke, diabetes, pulmonary hypertension. She is interested in [place] device. She has partial upper dentures, oral appliance may still be possibility if not candidate for Inspire.    Allergies  Allergen Reactions  . Flagyl [Metronidazole] Shortness Of Breath and Swelling  . Lyrica [Pregabalin] Shortness Of Breath  . Adhesive [Tape] Other (See Comments)    Takes off patient's skin  . Aspirin Other (See Comments)    Stomach bleed  . Latex Itching and Rash  . Phenergan [Promethazine Hcl] Other (See Comments)    Caused grogginess, altered mental status  . Prednisone Anxiety  . Sulfa Antibiotics Nausea Only  . Tetanus Toxoids Swelling    Arm Area    Immunization History  Administered Date(s) Administered  . Influenza, High Dose Seasonal PF 12/07/2018, 01/28/2020  . Janssen (J&J) SARS-COV-2 Vaccination 05/22/2019  . Pneumococcal Conjugate-13 06/23/2017  . Pneumococcal Polysaccharide-23 01/28/2020  . Zoster Recombinat (Shingrix) 12/26/2018    Past Medical History:  Diagnosis Date  . Arthritis   . COPD (chronic obstructive pulmonary disease) ([place])   . Diabetes ([place])   .  Diverticulitis   . Diverticulosis   . Dyspnea   . Fibromyalgia   . High cholesterol   . History of kidney stones   . Hypertension   . Hypothyroid   . IBS (irritable bowel syndrome)   . Osteopenia   . Pneumonia   . Restless leg   . Sleep apnea    Doesn't use CPAP.    Marland Kitchen Tachycardia    per pt/fim  . Tremor, essential 01/07/2018    Tobacco History: Social History   Tobacco Use  Smoking Status Never Smoker  Smokeless Tobacco Never Used   Counseling given: Not Answered   Outpatient Medications Prior to Visit  Medication Sig Dispense Refill  . acetaminophen (TYLENOL) 500 MG tablet Take 1,000 mg by mouth every 6 (six) hours as needed (for pain.).    Marland Kitchen albuterol (PROVENTIL HFA;VENTOLIN HFA) 108 (90 Base) MCG/ACT inhaler Inhale 2 puffs into the lungs every 6 (six) hours as needed for wheezing or shortness of breath.    . Aspirin 81 MG CAPS Take 81 mg by mouth at bedtime. Enteric Coated    . dicyclomine (BENTYL) 10 MG capsule Take 1 capsule (10 mg total) by mouth 3 (three) times daily as needed for spasms. (Patient taking differently: Take 10 mg by mouth 3 (three) times daily as needed for spasms (IBS/Diverticulitis symptoms.).) 90 capsule 5  . docusate sodium (COLACE) 100 MG capsule Take 2 capsules (200 mg total) by mouth at bedtime. (Patient taking differently: Take 200 mg by mouth daily as needed (constipation.).) 10 capsule 0  . esomeprazole (NEXIUM) 40  MG capsule Take 1 capsule (40 mg total) by mouth daily before breakfast. 30 capsule 5  . famotidine (PEPCID) 40 MG tablet TAKE 1 TABLET BY MOUTH AT BEDTIME 90 tablet 0  . Fluticasone-Salmeterol (ADVAIR) 250-50 MCG/DOSE AEPB Inhale 1 puff into the lungs every 12 (twelve) hours.    . gabapentin (NEURONTIN) 100 MG capsule Take 100 mg by mouth 3 (three) times daily.    Marland Kitchen levothyroxine (SYNTHROID, LEVOTHROID) 75 MCG tablet Take 37.5 mcg by mouth daily before breakfast.     . lisinopril (PRINIVIL,ZESTRIL) 10 MG tablet Take 10 mg by mouth  daily.     . metFORMIN (GLUCOPHAGE-XR) 500 MG 24 hr tablet Take 500 mg by mouth at bedtime.    . ondansetron (ZOFRAN) 4 MG tablet Take 1 tablet (4 mg total) by mouth 2 (two) times daily as needed for nausea or vomiting. 30 tablet 1  . polyethylene glycol powder (GLYCOLAX/MIRALAX) 17 GM/SCOOP powder Take 8.5 g by mouth daily. (Patient taking differently: Take 8.5 g by mouth daily as needed (constipation.).) 255 g 5  . simvastatin (ZOCOR) 20 MG tablet Take 20 mg by mouth every evening.    . traMADol (ULTRAM) 50 MG tablet Take 50-100 mg by mouth every 6 (six) hours as needed (back pain.).     Marland Kitchen traZODone (DESYREL) 100 MG tablet Take 100 mg by mouth at bedtime.    . Vitamin D, Ergocalciferol, (DRISDOL) 1.25 MG (50000 UNIT) CAPS capsule Take 50,000 Units by mouth once a week.     No facility-administered medications prior to visit.   Review of Systems  Review of Systems  Constitutional: Positive for fatigue.  HENT: Negative.   Respiratory: Negative.   Cardiovascular: Negative.     Physical Exam  BP 126/76 (BP Location: Right Arm, Cuff Size: Normal)   Pulse 72   Temp (!) 97.4 F (36.3 C)   Ht 5\' 1"  (1.549 m)   Wt 128 lb (58.1 kg)   SpO2 98%   BMI 24.19 kg/m  Physical Exam Constitutional:      Appearance: Normal appearance.  HENT:     Head: Normocephalic and atraumatic.     Mouth/Throat:     Comments: Mallampati class 0 Cardiovascular:     Rate and Rhythm: Normal rate and regular rhythm.     Comments: RRR Pulmonary:     Effort: Pulmonary effort is normal.     Breath sounds: Normal breath sounds.     Comments: CTA Musculoskeletal:        General: Normal range of motion.  Neurological:     General: No focal deficit present.     Mental Status: She is alert and oriented to person, place, and time. Mental status is at baseline.  Psychiatric:        Mood and Affect: Mood normal.        Behavior: Behavior normal.        Thought Content: Thought content normal.        Judgment:  Judgment normal.      Lab Results:  CBC    Component Value Date/Time   WBC 7.7 05/20/2019 1454   RBC 3.58 (L) 05/20/2019 1454   HGB 10.9 Repeated and verified X2. (L) 05/20/2019 1454   HCT 32.2 (L) 05/20/2019 1454   PLT 300.0 05/20/2019 1454   MCV 90.1 05/20/2019 1454   MCH 29.7 02/05/2019 1008   MCHC 33.7 05/20/2019 1454   RDW 14.4 05/20/2019 1454   LYMPHSABS 2.4 05/20/2019 1454   MONOABS 0.8  05/20/2019 1454   EOSABS 0.5 05/20/2019 1454   BASOSABS 0.1 05/20/2019 1454    BMET    Component Value Date/Time   NA 138 05/20/2019 1454   K 4.1 05/20/2019 1454   CL 104 05/20/2019 1454   CO2 27 05/20/2019 1454   GLUCOSE 103 (H) 05/20/2019 1454   BUN 17 05/20/2019 1454   CREATININE 1.17 05/20/2019 1454   CALCIUM 9.7 05/20/2019 1454   GFRNONAA 39 (L) 02/05/2019 1008   GFRAA 45 (L) 02/05/2019 1008    BNP No results found for: BNP  ProBNP No results found for: PROBNP  Imaging: No results found.   Assessment & Plan:   Moderate obstructive sleep apnea - Home sleep testing 04/27/20 showed moderate OSA, AHI 16.9 with SpO2 low 84% - Unable to tolerate CPAP in past and she is not interested in trying again  - Discussed treatment options and review risk of untreated sleep apnea. She would like referral to ENT to discuss Insp[facility] device. If not candidate would refer to orthodontics for consideration for oral appliance - Advised patient not to drive if experiencing excessive daytime fatigue/somnolence  - FU in 6 months    Martyn Ehrich, NP 06/01/2020

## 2020-06-01 ENCOUNTER — Encounter: Payer: Self-pay | Admitting: Primary Care

## 2020-06-01 ENCOUNTER — Ambulatory Visit (INDEPENDENT_AMBULATORY_CARE_PROVIDER_SITE_OTHER): Payer: Medicare Other | Admitting: Primary Care

## 2020-06-01 ENCOUNTER — Other Ambulatory Visit: Payer: Self-pay

## 2020-06-01 VITALS — BP 126/76 | HR 72 | Temp 97.4°F | Ht 61.0 in | Wt 128.0 lb

## 2020-06-01 DIAGNOSIS — G4733 Obstructive sleep apnea (adult) (pediatric): Secondary | ICD-10-CM

## 2020-06-01 NOTE — Assessment & Plan Note (Signed)
-   Home sleep testing 04/27/20 showed moderate OSA, AHI 16.9 with SpO2 low 84% - Unable to tolerate CPAP in past and she is not interested in trying again  - Discussed treatment options and review risk of untreated sleep apnea. She would like referral to ENT to discuss Inspire device. If not candidate would refer to orthodontics for consideration for oral appliance - Advised patient not to drive if experiencing excessive daytime fatigue/somnolence  - FU in 6 months

## 2020-06-01 NOTE — Patient Instructions (Signed)
Home sleep testing 04/27/20 showed moderate OSA  Treatment options include weight loss (not applicable to you), side sleeping position, oral appliance, CPAP or referral to ENT for possible surgical options.   Untreated sleep apnea put you at a higher risk for cardiovascular disease, cardiac arrhythmias, stroke, diabetes, pulmonary hypertension   If you are not a candidate for inspire device or do not wish to go through with this type of procedure would recommend we refer you to an orthodontist who specializes in sleep apnea to be fitted for oral appliance if able  Do not drive if experiencing excessive daytime fatigue or somnolence   Referral: ENT- consideration for inspire device   Follow-up 6 months with Heather Espinoza    Sleep Apnea Sleep apnea affects breathing during sleep. It causes breathing to stop for a short time or to become shallow. It can also increase the risk of:  Heart attack.  Stroke.  Being very overweight (obese).  Diabetes.  Heart failure.  Irregular heartbeat. The goal of treatment is to help you breathe normally again. What are the causes? There are three kinds of sleep apnea:  Obstructive sleep apnea. This is caused by a blocked or collapsed airway.  Central sleep apnea. This happens when the brain does not send the right signals to the muscles that control breathing.  Mixed sleep apnea. This is a combination of obstructive and central sleep apnea. The most common cause of this condition is a collapsed or blocked airway. This can happen if:  Your throat muscles are too relaxed.  Your tongue and tonsils are too large.  You are overweight.  Your airway is too small.   What increases the risk?  Being overweight.  Smoking.  Having a small airway.  Being older.  Being female.  Drinking alcohol.  Taking medicines to calm yourself (sedatives or tranquilizers).  Having family members with the condition. What are the signs or symptoms?  Trouble  staying asleep.  Being sleepy or tired during the day.  Getting angry a lot.  Loud snoring.  Headaches in the morning.  Not being able to focus your mind (concentrate).  Forgetting things.  Less interest in sex.  Mood swings.  Personality changes.  Feelings of sadness (depression).  Waking up a lot during the night to pee (urinate).  Dry mouth.  Sore throat. How is this diagnosed?  Your medical history.  A physical exam.  A test that is done when you are sleeping (sleep study). The test is most often done in a sleep lab but may also be done at home. How is this treated?  Sleeping on your side.  Using a medicine to get rid of mucus in your nose (decongestant).  Avoiding the use of alcohol, medicines to help you relax, or certain pain medicines (narcotics).  Losing weight, if needed.  Changing your diet.  Not smoking.  Using a machine to open your airway while you sleep, such as: ? An oral appliance. This is a mouthpiece that shifts your lower jaw forward. ? A CPAP device. This device blows air through a mask when you breathe out (exhale). ? An EPAP device. This has valves that you put in each nostril. ? A BPAP device. This device blows air through a mask when you breathe in (inhale) and breathe out.  Having surgery if other treatments do not work. It is important to get treatment for sleep apnea. Without treatment, it can lead to:  High blood pressure.  Coronary artery disease.  In men, not being able to have an erection (impotence).  Reduced thinking ability.   Follow these instructions at home: Lifestyle  Make changes that your doctor recommends.  Eat a healthy diet.  Lose weight if needed.  Avoid alcohol, medicines to help you relax, and some pain medicines.  Do not use any products that contain nicotine or tobacco, such as cigarettes, e-cigarettes, and chewing tobacco. If you need help quitting, ask your doctor. General  instructions  Take over-the-counter and prescription medicines only as told by your doctor.  If you were given a machine to use while you sleep, use it only as told by your doctor.  If you are having surgery, make sure to tell your doctor you have sleep apnea. You may need to bring your device with you.  Keep all follow-up visits as told by your doctor. This is important. Contact a doctor if:  The machine that you were given to use during sleep bothers you or does not seem to be working.  You do not get better.  You get worse. Get help right away if:  Your chest hurts.  You have trouble breathing in enough air.  You have an uncomfortable feeling in your back, arms, or stomach.  You have trouble talking.  One side of your body feels weak.  A part of your face is hanging down. These symptoms may be an emergency. Do not wait to see if the symptoms will go away. Get medical help right away. Call your local emergency services (911 in the [place] Do not drive yourself to the hospital. Summary  This condition affects breathing during sleep.  The most common cause is a collapsed or blocked airway.  The goal of treatment is to help you breathe normally while you sleep. This information is not intended to replace advice given to you by your health care provider. Make sure you discuss any questions you have with your health care provider. Document Revised: 12/20/2017 Document Reviewed: 10/29/2017 Elsevier Patient Education  [place].

## 2020-06-13 ENCOUNTER — Other Ambulatory Visit (INDEPENDENT_AMBULATORY_CARE_PROVIDER_SITE_OTHER): Payer: Self-pay | Admitting: Internal Medicine

## 2020-06-16 NOTE — Progress Notes (Deleted)
01/28/20- 22 yoF never smoker from [place], [place] for sleep evaluation Had seen Dr Valeta Harms for Dyspnea/ COPD, fam hx a1ATdef( MZ 87). Referred now by Warner Mccreedy, NP with suspected OSA. HST ordered but not done.  Medical problem list includes HTN, IBS, GERD, Hypothyroid, DM, Tremor, CKD3, Hypercholesterolemia,  Body weight today- 122 lbs [facility]- 4 Covid vax- J&J Flu vax- today senior Sleep study 2005 at [facility]. CPAP then for a month, but face broke out from mask Since then has lost 35 lbs. Husband, truck driver away a lot, used to tell her that she snored. She minimizes daytime tiredness and snoring now, but some concern about breathing/ oxygen at night. ENT surgery + tonsils. Esophageal dilatation x 2.  06/18/20- 37 yoF never smoker from [place], [place] followed for OSA,  Had seen Dr Valeta Harms for Dyspnea/ COPD, fam hx a1ATdef( MZ 87).  Medical problem list includes HTN, IBS, GERD, Hypothyroid, DM, Tremor, CKD3, Hypercholesterolemia,  HST 04/27/20- AHI 16.9/ hr, desaturation to 84%, body weight 122 lbs Referal order to [facility] ENT on 3/16 for OSA- consideration of Inspire, by [employer]. Body weight today- Covid vax- Flu vax-    ROS-see HPI   + = positive Constitutional:   + weight loss, night sweats, fevers, chills, fatigue, lassitude. HEENT:    +headaches, +difficulty swallowing, +tooth/dental problems, sore throat,       +sneezing, itching, +ear ache, nasal congestion, post nasal drip, snoring CV:    chest pain, orthopnea, PND, swelling in lower extremities, anasarca,                                  dizziness, palpitations Resp:   +shortness of breath with exertion or at rest.                productive cough,   non-productive cough, coughing up of blood.              change in color of mucus.  wheezing.   Skin:    +rash or lesions. GI:  + heartburn, indigestion, +abdominal pain, nausea, vomiting, diarrhea,                 change in bowel habits, +loss of appetite GU: dysuria, change in  color of urine, no urgency or frequency.   flank pain. MS:  + joint pain, stiffness, decreased range of motion, back pain. Neuro-     nothing unusual Psych:  change in mood or affect.  +depression or +anxiety.   memory loss.  OBJ- Physical Exam General- Alert, Oriented, Affect-appropriate, Distress- none acute Skin- rash-none, lesions- none, excoriation- none Lymphadenopathy- none Head- atraumatic            Eyes- Gross vision intact, PERRLA, conjunctivae and secretions clear            Ears- Hearing, canals-normal            Nose- Clear, no-Septal dev, mucus, polyps, erosion, perforation             Throat- Mallampati III, mucosa clear , drainage- none, tonsils- absent, +teeth Neck- flexible , trachea midline, no stridor , thyroid nl, carotid no bruit Chest - symmetrical excursion , unlabored           Heart/CV- RRR , no murmur , no gallop  , no rub, nl s1 s2                           -  JVD- none , edema- none, stasis changes- none, varices- none           Lung- clear to P&A, wheeze- none, cough- none , dullness-none, rub- none           Chest wall-  Abd-  Br/ Gen/ Rectal- Not done, not indicated Extrem- cyanosis- none, clubbing, none, atrophy- none, strength- nl Neuro- grossly intact to observation

## 2020-06-17 ENCOUNTER — Ambulatory Visit: Payer: Medicare Other | Admitting: Internal Medicine

## 2020-07-06 ENCOUNTER — Other Ambulatory Visit (INDEPENDENT_AMBULATORY_CARE_PROVIDER_SITE_OTHER): Payer: Self-pay | Admitting: Gastroenterology

## 2020-07-06 NOTE — Telephone Encounter (Signed)
Last seen 03/29/2020 for Heather Espinoza

## 2020-07-25 ENCOUNTER — Other Ambulatory Visit (INDEPENDENT_AMBULATORY_CARE_PROVIDER_SITE_OTHER): Payer: Self-pay | Admitting: Gastroenterology

## 2020-07-25 NOTE — Telephone Encounter (Signed)
Noted to discuss with Dr. Rehman. 

## 2020-07-30 ENCOUNTER — Emergency Department (HOSPITAL_COMMUNITY): Payer: Medicare Other

## 2020-07-30 ENCOUNTER — Emergency Department (HOSPITAL_COMMUNITY)
Admission: EM | Admit: 2020-07-30 | Discharge: 2020-07-30 | Disposition: A | Discharge status: Home / Self Care | Payer: Medicare Other | Attending: Emergency Medicine | Admitting: Emergency Medicine

## 2020-07-30 ENCOUNTER — Encounter (HOSPITAL_COMMUNITY): Payer: Self-pay | Admitting: *Deleted

## 2020-07-30 ENCOUNTER — Other Ambulatory Visit: Payer: Self-pay

## 2020-07-30 DIAGNOSIS — J449 Chronic obstructive pulmonary disease, unspecified: Secondary | ICD-10-CM | POA: Diagnosis not present

## 2020-07-30 DIAGNOSIS — E039 Hypothyroidism, unspecified: Secondary | ICD-10-CM | POA: Diagnosis not present

## 2020-07-30 DIAGNOSIS — Z91040 Latex allergy status: Secondary | ICD-10-CM | POA: Insufficient documentation

## 2020-07-30 DIAGNOSIS — Z7984 Long term (current) use of oral hypoglycemic drugs: Secondary | ICD-10-CM | POA: Diagnosis not present

## 2020-07-30 DIAGNOSIS — Z7982 Long term (current) use of aspirin: Secondary | ICD-10-CM | POA: Diagnosis not present

## 2020-07-30 DIAGNOSIS — R1012 Left upper quadrant pain: Secondary | ICD-10-CM | POA: Diagnosis present

## 2020-07-30 DIAGNOSIS — Z8719 Personal history of other diseases of the digestive system: Secondary | ICD-10-CM | POA: Diagnosis not present

## 2020-07-30 DIAGNOSIS — E1122 Type 2 diabetes mellitus with diabetic chronic kidney disease: Secondary | ICD-10-CM | POA: Insufficient documentation

## 2020-07-30 DIAGNOSIS — I129 Hypertensive chronic kidney disease with stage 1 through stage 4 chronic kidney disease, or unspecified chronic kidney disease: Secondary | ICD-10-CM | POA: Diagnosis not present

## 2020-07-30 DIAGNOSIS — N179 Acute kidney failure, unspecified: Secondary | ICD-10-CM | POA: Insufficient documentation

## 2020-07-30 DIAGNOSIS — N183 Chronic kidney disease, stage 3 unspecified: Secondary | ICD-10-CM | POA: Insufficient documentation

## 2020-07-30 DIAGNOSIS — R109 Unspecified abdominal pain: Secondary | ICD-10-CM

## 2020-07-30 DIAGNOSIS — R1032 Left lower quadrant pain: Secondary | ICD-10-CM | POA: Insufficient documentation

## 2020-07-30 DIAGNOSIS — Z7952 Long term (current) use of systemic steroids: Secondary | ICD-10-CM | POA: Diagnosis not present

## 2020-07-30 DIAGNOSIS — Z20822 Contact with and (suspected) exposure to covid-19: Secondary | ICD-10-CM | POA: Insufficient documentation

## 2020-07-30 LAB — URINALYSIS, ROUTINE W REFLEX MICROSCOPIC
Bilirubin Urine: NEGATIVE
Glucose, UA: NEGATIVE mg/dL
Hgb urine dipstick: NEGATIVE
Ketones, ur: NEGATIVE mg/dL
Nitrite: NEGATIVE
Protein, ur: 30 mg/dL — AB
Specific Gravity, Urine: 1.013 (ref 1.005–1.030)
WBC, UA: >50 WBC/hpf — ABNORMAL HIGH (ref 0–5)
pH: 5 (ref 5.0–8.0)

## 2020-07-30 LAB — CBC WITH DIFFERENTIAL/PLATELET
Abs Immature Granulocytes: 0.04 10*3/uL (ref 0.00–0.07)
Basophils Absolute: 0.1 10*3/uL (ref 0.0–0.1)
Basophils Relative: 1 %
Eosinophils Absolute: 0.4 10*3/uL (ref 0.0–0.5)
Eosinophils Relative: 5 %
HCT: 33.5 % — ABNORMAL LOW (ref 36.0–46.0)
Hemoglobin: 11 g/dL — ABNORMAL LOW (ref 12.0–15.0)
Immature Granulocytes: 1 %
Lymphocytes Relative: 27 %
Lymphs Abs: 2 10*3/uL (ref 0.7–4.0)
MCH: 33.2 pg (ref 26.0–34.0)
MCHC: 32.8 g/dL (ref 30.0–36.0)
MCV: 101.2 fL — ABNORMAL HIGH (ref 80.0–100.0)
Monocytes Absolute: 0.9 10*3/uL (ref 0.1–1.0)
Monocytes Relative: 13 %
Neutro Abs: 4 10*3/uL (ref 1.7–7.7)
Neutrophils Relative %: 53 %
Platelets: 315 10*3/uL (ref 150–400)
RBC: 3.31 MIL/uL — ABNORMAL LOW (ref 3.87–5.11)
RDW: 12.7 % (ref 11.5–15.5)
WBC: 7.4 10*3/uL (ref 4.0–10.5)
nRBC: 0 % (ref 0.0–0.2)

## 2020-07-30 LAB — I-STAT CHEM 8, ED
BUN: 26 mg/dL — ABNORMAL HIGH (ref 8–23)
Calcium, Ion: 1.29 mmol/L (ref 1.15–1.40)
Chloride: 110 mmol/L (ref 98–111)
Creatinine, Ser: 1.6 mg/dL — ABNORMAL HIGH (ref 0.44–1.00)
Glucose, Bld: 88 mg/dL (ref 70–99)
HCT: 32 % — ABNORMAL LOW (ref 36.0–46.0)
Hemoglobin: 10.9 g/dL — ABNORMAL LOW (ref 12.0–15.0)
Potassium: 3.9 mmol/L (ref 3.5–5.1)
Sodium: 141 mmol/L (ref 135–145)
TCO2: 20 mmol/L — ABNORMAL LOW (ref 22–32)

## 2020-07-30 LAB — COMPREHENSIVE METABOLIC PANEL
ALT: 22 U/L (ref 0–44)
AST: 26 U/L (ref 15–41)
Albumin: 3.7 g/dL (ref 3.5–5.0)
Alkaline Phosphatase: 40 U/L (ref 38–126)
Anion gap: 6 (ref 5–15)
BUN: 27 mg/dL — ABNORMAL HIGH (ref 8–23)
CO2: 20 mmol/L — ABNORMAL LOW (ref 22–32)
Calcium: 9.1 mg/dL (ref 8.9–10.3)
Chloride: 109 mmol/L (ref 98–111)
Creatinine, Ser: 1.67 mg/dL — ABNORMAL HIGH (ref 0.44–1.00)
GFR, Estimated: 32 mL/min — ABNORMAL LOW (ref >60)
Glucose, Bld: 87 mg/dL (ref 70–99)
Potassium: 3.8 mmol/L (ref 3.5–5.1)
Sodium: 135 mmol/L (ref 135–145)
Total Bilirubin: 0.5 mg/dL (ref 0.3–1.2)
Total Protein: 6.9 g/dL (ref 6.5–8.1)

## 2020-07-30 LAB — RESP PANEL BY RT-PCR (FLU A&B, COVID) ARPGX2
Influenza A by PCR: NEGATIVE
Influenza B by PCR: NEGATIVE
SARS Coronavirus 2 by RT PCR: NEGATIVE

## 2020-07-30 LAB — LIPASE, BLOOD: Lipase: 23 U/L (ref 11–51)

## 2020-07-30 MED ORDER — ONDANSETRON HCL 4 MG PO TABS: 4.0000 mg | ORAL_TABLET | Freq: Four times a day (QID) | ORAL | 0 refills | Status: DC

## 2020-07-30 MED ORDER — ONDANSETRON HCL 4 MG/2ML IJ SOLN
4.0000 mg | Freq: Once | INTRAMUSCULAR | Status: AC
  Administered 2020-07-30: 4 mg via INTRAVENOUS
  Filled 2020-07-30: qty 2

## 2020-07-30 MED ORDER — SODIUM CHLORIDE 0.9 % IV BOLUS
1000.0000 mL | Freq: Once | INTRAVENOUS | Status: AC
  Administered 2020-07-30: 1000 mL via INTRAVENOUS

## 2020-07-30 MED ORDER — OXYCODONE-ACETAMINOPHEN 5-325 MG PO TABS
1.0000 | ORAL_TABLET | Freq: Once | ORAL | Status: AC
  Administered 2020-07-30: 1 via ORAL
  Filled 2020-07-30: qty 1

## 2020-07-30 MED ORDER — AMOXICILLIN-POT CLAVULANATE 875-125 MG PO TABS: 1.0000 | ORAL_TABLET | Freq: Two times a day (BID) | ORAL | 0 refills | Status: AC

## 2020-07-30 NOTE — ED Provider Notes (Signed)
[facility] EMERGENCY DEPARTMENT Provider Note   CSN: [id number] Arrival date & time: 07/30/20  1235     History Chief Complaint  Patient presents with  . Emesis  . Diarrhea    Heather Espinoza is a 75 y.o. female.  HPI    Pt is a 75 y/o female with a h/o arthritis, copd, dm, diverticulitis, fibromyalgias, hld, nephrolithiasis, htn, hypothyroid, ibs, osteopenia, pna, restless leg, sleep apnea, essential tremor, who presents to the ED today for eval of NVD that started a few days ago. She further reports some llq abd pain. Rates pain 6/10. Pain is intermittent. Denies hematochezia, melena or hematemesis. She also reports some nasal congestion, headache. Denies fevers or urinary sxs.  Past Medical History:  Diagnosis Date  . Arthritis   . COPD (chronic obstructive pulmonary disease) ([place])   . Diabetes ([place])   . Diverticulitis   . Diverticulosis   . Dyspnea   . Fibromyalgia   . High cholesterol   . History of kidney stones   . Hypertension   . Hypothyroid   . IBS (irritable bowel syndrome)   . Osteopenia   . Pneumonia   . Restless leg   . Sleep apnea    Doesn't use CPAP.    Marland Kitchen Tachycardia    per pt/fim  . Tremor, essential 01/07/2018    Patient Active Problem List   Diagnosis Date Noted  . Moderate obstructive sleep apnea 06/01/2020  . Snoring 01/29/2020  . At risk for obstructive sleep apnea 10/22/2019  . Allergic rhinitis 10/22/2019  . Constipation 06/30/2019  . Nausea without vomiting 03/17/2019  . Diarrhea 03/17/2019  . CKD (chronic kidney disease), stage III ([place]) 02/03/2019  . Acute blood loss anemia 02/03/2019  . Parotitis 02/03/2019  . Cellulitis of face 02/03/2019  . Spondylolisthesis, lumbar region 01/12/2019  . Tachycardia 01/07/2019  . Syncope and collapse 01/07/2019  . Palpitations 07/01/2018  . SOB (shortness of breath) 07/01/2018  . Educated about COVID-19 virus infection 07/01/2018  . Lobar pneumonia ([place]) 04/10/2018  . Streptococcus  pneumoniae pneumonia ([place]) 04/10/2018  . Prolonged QT interval 04/10/2018  . Tremor, essential 01/07/2018  . Esophageal dysphagia 12/21/2016  . Primary osteoarthritis of first carpometacarpal joint of left hand 12/11/2016  . Status post wrist surgery 12/11/2016  . Chronic low back pain 10/07/2015  . IBS (irritable bowel syndrome) 02/17/2013  . GERD (gastroesophageal reflux disease) 02/17/2013  . Depression 02/17/2013  . Family hx of colon cancer 11/24/2012  . Essential hypertension, benign 11/24/2012  . High cholesterol 11/24/2012  . Diabetes ([place]) 11/24/2012  . Fibromyalgia 11/24/2012  . Acquired hypothyroidism 11/24/2012    Past Surgical History:  Procedure Laterality Date  . APPENDECTOMY    . BIOPSY  12/25/2016   Procedure: BIOPSY;  Surgeon: Rogene Houston, MD;  Location: [facility];  Service: Endoscopy;;  gastric   . BREAST LUMPECTOMY Right 2001  . CATARACT EXTRACTION Bilateral 2016  . CHOLECYSTECTOMY    . COLONOSCOPY N/A 12/17/2012   Procedure: COLONOSCOPY;  Surgeon: Rogene Houston, MD;  Location: [facility];  Service: Endoscopy;  Laterality: N/A;  215  . COLONOSCOPY WITH PROPOFOL N/A 07/24/2019   Procedure: COLONOSCOPY WITH PROPOFOL;  Surgeon: Rogene Houston, MD;  Location: [facility];  Service: Endoscopy;  Laterality: N/A;  730  . complete hysterectomy    . ESOPHAGEAL DILATION N/A 12/25/2016   Procedure: ESOPHAGEAL DILATION;  Surgeon: Rogene Houston, MD;  [facility]: [facility];  Service: Endoscopy;  Laterality:  N/A;  . ESOPHAGEAL DILATION N/A 07/24/2019   Procedure: ESOPHAGEAL DILATION;  Surgeon: Rogene Houston, MD;  [facility];  Service: Endoscopy;  Laterality: N/A;  . ESOPHAGOGASTRODUODENOSCOPY N/A 12/25/2016   Procedure: ESOPHAGOGASTRODUODENOSCOPY (EGD);  Surgeon: Rogene Houston, MD;  [facility];  Service: Endoscopy;  Laterality: N/A;  730  . ESOPHAGOGASTRODUODENOSCOPY (EGD) WITH PROPOFOL N/A 07/24/2019   Procedure:  ESOPHAGOGASTRODUODENOSCOPY (EGD) WITH PROPOFOL;  Surgeon: Rogene Houston, MD;  [facility];  Service: Endoscopy;  Laterality: N/A;  . Heel tumor removed    . POLYPECTOMY  07/24/2019   Procedure: POLYPECTOMY;  Surgeon: Rogene Houston, MD;  [facility];  Service: Endoscopy;;  . rt elbow surgery    . TONSILLECTOMY       OB History   No obstetric history on file.     Family History  Problem Relation Age of Onset  . Uterine cancer Mother   . Parkinson's disease Father   . Stroke Father   . Colon cancer Brother   . Lung cancer Brother   . COPD Sister   . Diabetes Grandchild   . Asthma Son     Social History   Tobacco Use  . Smoking status: Never Smoker  . Smokeless tobacco: Never Used  Vaping Use  . Vaping Use: Never used  Substance Use Topics  . Alcohol use: No    Alcohol/week: 0.0 standard drinks  . Drug use: No    Home Medications Prior to Admission medications   Medication Sig Start Date End Date Taking? Authorizing Provider  acetaminophen (TYLENOL) 500 MG tablet Take 1,000 mg by mouth every 6 (six) hours as needed (for pain.).   Yes [provider]  albuterol (PROVENTIL HFA;VENTOLIN HFA) 108 (90 Base) MCG/ACT inhaler Inhale 2 puffs into the lungs every 6 (six) hours as needed for wheezing or shortness of breath.   Yes [provider]  amoxicillin-clavulanate (AUGMENTIN) 875-125 MG tablet Take 1 tablet by mouth 2 (two) times daily for 7 days. 07/30/20 08/06/20 Yes Cherrelle Plante S, PA-C  Aspirin 81 MG CAPS Take 81 mg by mouth at bedtime. Enteric Coated 07/27/19  Yes Rehman, Mechele Dawley, MD  dicyclomine (BENTYL) 10 MG capsule Take 1 capsule (10 mg total) by mouth 3 (three) times daily as needed for spasms. Patient taking differently: Take 10 mg by mouth 3 (three) times daily as needed for spasms (IBS/Diverticulitis symptoms.). 03/17/19  Yes Rehman, Mechele Dawley, MD  docusate sodium (COLACE) 100 MG capsule Take 2 capsules (200 mg total) by  mouth at bedtime. Patient taking differently: Take 200 mg by mouth daily as needed (constipation.). 01/04/15  Yes Rehman, Mechele Dawley, MD  esomeprazole (NEXIUM) 40 MG capsule Take 1 capsule (40 mg total) by mouth daily before breakfast. 03/29/20  Yes Rehman, Mechele Dawley, MD  famotidine (PEPCID) 40 MG tablet TAKE 1 TABLET BY MOUTH AT BEDTIME 05/23/20  Yes Rehman, Mechele Dawley, MD  Fluticasone-Salmeterol (ADVAIR) 250-50 MCG/DOSE AEPB Inhale 1 puff into the lungs every 12 (twelve) hours.   Yes [provider]  gabapentin (NEURONTIN) 100 MG capsule Take 100 mg by mouth 3 (three) times daily.   Yes [provider]  levothyroxine (SYNTHROID, LEVOTHROID) 75 MCG tablet Take 37.5 mcg by mouth daily before breakfast.    Yes [provider]  lisinopril (PRINIVIL,ZESTRIL) 10 MG tablet Take 10 mg by mouth daily.    Yes [provider]  metFORMIN (GLUCOPHAGE-XR) 500 MG 24 hr tablet Take 500  mg by mouth at bedtime. 01/12/19  Yes [provider]  ondansetron (ZOFRAN) 4 MG tablet Take 1 tablet (4 mg total) by mouth every 6 (six) hours. 07/30/20  Yes Pistol Kessenich S, PA-C  polyethylene glycol powder (GLYCOLAX/MIRALAX) 17 GM/SCOOP powder Take 8.5 g by mouth daily. Patient taking differently: Take 8.5 g by mouth daily as needed (constipation.). 06/30/19  Yes Rehman, Mechele Dawley, MD  simvastatin (ZOCOR) 20 MG tablet Take 20 mg by mouth every evening.   Yes [provider]  traMADol (ULTRAM) 50 MG tablet Take 50-100 mg by mouth every 6 (six) hours as needed (back pain.).  06/13/19  Yes [provider]  traZODone (DESYREL) 100 MG tablet Take 50 mg by mouth at bedtime.   Yes [provider]  Vitamin D, Ergocalciferol, (DRISDOL) 1.25 MG (50000 UNIT) CAPS capsule Take 50,000 Units by mouth once a week. Patient not taking: No sig reported 05/03/20   [provider]    Allergies    Flagyl [metronidazole], Lyrica [pregabalin], Adhesive [tape], Aspirin, Latex,  Phenergan [promethazine hcl], Prednisone, Sulfa antibiotics, and Tetanus toxoids  Review of Systems   Review of Systems  Constitutional: Negative for chills and fever.  HENT: Negative for ear pain and sore throat.   Eyes: Negative for visual disturbance.  Respiratory: Negative for cough and shortness of breath.   Cardiovascular: Negative for chest pain.  Gastrointestinal: Positive for abdominal pain, diarrhea, nausea and vomiting. Negative for constipation.  Genitourinary: Negative for dysuria, hematuria and urgency.  Musculoskeletal: Negative for back pain.  Skin: Negative for rash.  Neurological: Positive for headaches.  All other systems reviewed and are negative.  Physical Exam Updated Vital Signs BP (!) 114/95   Pulse (!) 55   Temp 97.8 F (36.6 C) (Oral)   Resp 18   Ht 5' 1.5" (1.562 m)   Wt 55.3 kg   SpO2 100%   BMI 22.68 kg/m   Physical Exam  Vitals and nursing note reviewed.  Constitutional:      General: She is not in acute distress.    Appearance: She is well-developed.  HENT:     Head: Normocephalic and atraumatic.     Mouth/Throat:     Mouth: Mucous membranes are dry.  Eyes:     Conjunctiva/sclera: Conjunctivae normal.  Cardiovascular:     Rate and Rhythm: Normal rate and regular rhythm.     Heart sounds: Normal heart sounds. No murmur heard.   Pulmonary:     Effort: Pulmonary effort is normal. No respiratory distress.     Breath sounds: Normal breath sounds. No wheezing, rhonchi or rales.  Abdominal:     General: Bowel sounds are normal.     Palpations: Abdomen is soft.     Tenderness: There is abdominal tenderness in the left upper quadrant and left lower quadrant. There is no guarding or rebound.  Musculoskeletal:     Cervical back: Neck supple.  Skin:    General: Skin is warm and dry.  Neurological:     Mental Status: She is alert. Mental Status:  Alert, thought content appropriate, able to give a coherent history. Speech fluent without  evidence of aphasia. Able to follow 2 step commands without difficulty.  Cranial Nerves grossly intact to observation Motor:  Normal tone. 5/5 strength of BUE and BLE major muscle groups including strong and equal grip strength and dorsiflexion/plantar flexion Gait: normal gait and balance.    ED Results / Procedures / Treatments   Labs (all labs ordered  are listed, but only abnormal results are displayed) Labs Reviewed  COMPREHENSIVE METABOLIC PANEL - Abnormal; Notable for the following components:      Result Value   CO2 20 (*)    BUN 27 (*)    Creatinine, Ser 1.67 (*)    GFR, Estimated 32 (*)    All other components within normal limits  CBC WITH DIFFERENTIAL/PLATELET - Abnormal; Notable for the following components:   RBC 3.31 (*)    Hemoglobin 11.0 (*)    HCT 33.5 (*)    MCV 101.2 (*)    All other components within normal limits  URINALYSIS, ROUTINE W REFLEX MICROSCOPIC - Abnormal; Notable for the following components:   APPearance CLOUDY (*)    Protein, ur 30 (*)    Leukocytes,Ua MODERATE (*)    WBC, UA >50 (*)    Bacteria, UA FEW (*)    All other components within normal limits  I-STAT CHEM 8, ED - Abnormal; Notable for the following components:   BUN 26 (*)    Creatinine, Ser 1.60 (*)    TCO2 20 (*)    Hemoglobin 10.9 (*)    HCT 32.0 (*)    All other components within normal limits  RESP PANEL BY RT-PCR (FLU A&B, COVID) ARPGX2  URINE CULTURE  LIPASE, BLOOD    EKG None  Radiology CT ABDOMEN PELVIS WO CONTRAST  Result Date: 07/30/2020 CLINICAL DATA:  Diarrhea, vomiting, abdominal pain EXAM: CT ABDOMEN AND PELVIS WITHOUT CONTRAST TECHNIQUE: Multidetector CT imaging of the abdomen and pelvis was performed following the standard protocol without IV contrast. COMPARISON:  CT 03/31/2020 and previous FINDINGS: Lower chest: No acute abnormality. Hepatobiliary: No focal liver abnormality is seen. Status post cholecystectomy. No biliary dilatation. Pancreas:  Unremarkable. No pancreatic ductal dilatation or surrounding inflammatory changes. Spleen: Normal in size.  Scattered calcified granulomas. Adrenals/Urinary Tract: Adrenal glands unremarkable. Right kidney unremarkable. 1 cm left lower pole renal calculus; no hydronephrosis. The urinary bladder is incompletely distended Stomach/Bowel: Stomach and small bowel decompressed. Appendix surgically absent. Scattered colonic diverticula most numerous in the distal descending and sigmoid segments, without focal inflammatory process or abscess. Vascular/Lymphatic: Scattered calcified aortoiliac atheromatous plaque without aneurysm. No abdominal or pelvic adenopathy. Reproductive: Status post hysterectomy. No adnexal masses. Other: No ascites.  Bilateral pelvic phleboliths.  No free air. Musculoskeletal: Solid-appearing instrumented PLIF L4-L5. No fracture or worrisome bone lesion. IMPRESSION: 1. No acute findings. 2. Left nephrolithiasis without hydronephrosis. 3. Descending and sigmoid diverticulosis. 4. Aortoiliac  atherosclerosis (ICD10-170.0). Electronically Signed   By: Lucrezia Europe M.D.   On: 07/30/2020 15:35    This patient was evaluated during a time of global shortage of iodinated contrast media. Based on guidance from the [employer] of Radiology, best practices, and local institutional approaches an alternative path for evaluating and managing the patient may have been employed in order to provide optimal care during this shortage. The current situation has been discussed with the patient.   Procedures Procedures   Medications Ordered in ED Medications  sodium chloride 0.9 % bolus 1,000 mL (0 mLs Intravenous Stopped 07/30/20 1434)  ondansetron (ZOFRAN) injection 4 mg (4 mg Intravenous Given 07/30/20 1434)  sodium chloride 0.9 % bolus 1,000 mL (0 mLs Intravenous Stopped 07/30/20 1807)  ondansetron (ZOFRAN) injection 4 mg (4 mg Intravenous Given 07/30/20 1644)  oxyCODONE-acetaminophen (PERCOCET/ROXICET)  5-325 MG per tablet 1 tablet (1 tablet Oral Given 07/30/20 1645)    ED Course  I have reviewed the triage vital signs and the  nursing notes.  Pertinent labs & imaging results that were available during my care of the patient were reviewed by me and considered in my medical decision making (see chart for details).    MDM Rules/Calculators/A&P                          75 y/o f presenting for nvd, abd pain and headache.  Reviewed/interpreted labs CBC no leukocytosis, she does have mild anemia which appears to be chronic CMP with elevated BUN/creatinine which is new from prior, LFTs are reassuring and electrolytes are reassuring Lipase negative UA shows proteinuria, moderate leuks, 0-5 RBCs, greater than 50 WBCs and few bacteria.  Patient denies any urinary symptoms so we will send a culture at this time and hold on abx tx for uti   EKG with nsr, supraventricular bigeminy, low voltage in the precordial leads  Reviewed/interpreted imaging CT abd/pelvis -  1. No acute findings. 2. Left nephrolithiasis without hydronephrosis. 3. Descending and sigmoid diverticulosis. 4. Aortoiliac  atherosclerosis   Patient was given IV fluids, antiemetics and pain meds here in the emergency department.  On reassessment she is still having some discomfort but declines further pain medications at this time.  We discussed her AKI and I offered admission for further treatment of this and monitoring her in the setting of her vomiting and diarrhea however she declines at this time.  She does still have focal left lower quadrant tenderness and with the lack of contrast on CT scan, feel she is appropriate to be treated for suspected diverticulitis with Augmentin.  I will also give Rx for pain medicines and nausea medications.  I have given her strict precautions that she needs to follow-up with her PCP for recheck of her creatinine in 3 days.  She agrees that if she does not start to feel better in the next 24 hours she  will return to the emergency department for reassessment.  All questions were answered and the patient appears to be stable for discharge.    Final Clinical Impression(s) / ED Diagnoses Final diagnoses:  Abdominal pain, unspecified abdominal location  AKI (acute kidney injury) ([place])    Rx / DC Orders ED Discharge Orders         Ordered    ondansetron (ZOFRAN) 4 MG tablet  Every 6 hours        07/30/20 1800    amoxicillin-clavulanate (AUGMENTIN) 875-125 MG tablet  2 times daily        07/30/20 [address] Eisley Barber S, PA-C 07/30/20 1903    Fredia Sorrow, MD 07/31/20 1731

## 2020-07-30 NOTE — ED Notes (Addendum)
Patient tolerated fluid challenge well. 

## 2020-07-30 NOTE — ED Triage Notes (Signed)
Diarrhea with vomiting for several days, also has abdominal pain and headache.

## 2020-07-30 NOTE — Discharge Instructions (Addendum)
You were given a prescription for antibiotics for suspected diverticulitis. Please take the antibiotic prescription fully.   You need to follow up with your doctor on Monday 08/03/2020 to have your kidney function rechecked  A culture was sent of your urine today to determine if there is any bacterial growth. If the results of the culture are positive and you require an antibiotic or a change of your prescribed antibiotic you will be contacted by the hospital. If the results are negative you will not be contacted.  Please follow up with your primary doctor within the next 5-7 days.  If you do not have a primary care provider, information for a healthcare clinic has been provided for you to make arrangements for follow up care. Please return to the ER sooner if you have any new or worsening symptoms, or if you have any of the following symptoms:  Abdominal pain that does not go away.  You have a fever.  You keep throwing up (vomiting).  The pain is felt only in portions of the abdomen. Pain in the right side could possibly be appendicitis. In an adult, pain in the left lower portion of the abdomen could be colitis or diverticulitis.  You pass bloody or black tarry stools.  There is bright red blood in the stool.  The constipation stays for more than 4 days.  There is belly (abdominal) or rectal pain.  You do not seem to be getting better.  You have any questions or concerns.

## 2020-08-02 LAB — URINE CULTURE: Culture: >=100000 — AB

## 2020-08-03 ENCOUNTER — Telehealth: Payer: Self-pay | Admitting: Emergency Medicine

## 2020-08-03 NOTE — Telephone Encounter (Signed)
Post ED Visit - Positive Culture Follow-up  Culture report reviewed by antimicrobial stewardship pharmacist: [place] Team []  Elenor Quinones, Pharm.D. []  Heide Guile, Pharm.D., BCPS AQ-ID []  Parks Neptune, Pharm.D., BCPS []  Alycia Rossetti, Pharm.D., BCPS []  [place], Pharm.D., BCPS, AAHIVP []  Legrand Como, Pharm.D., BCPS, AAHIVP []  Salome Arnt, PharmD, BCPS []  Johnnette Gourd, PharmD, BCPS []  Hughes Better, PharmD, BCPS []  Leeroy Cha, PharmD []  Laqueta Linden, PharmD, BCPS []  Albertina Parr, PharmD Rebbeca Paul PharmD  [place] Team []  Leodis Sias, PharmD []  Lindell Spar, PharmD []  Royetta Asal, PharmD []  Graylin Shiver, Rph []  Rema Fendt) Glennon Mac, PharmD []  Arlyn Dunning, PharmD []  Netta Cedars, PharmD []  Dia Sitter, PharmD []  Leone Haven, PharmD []  Gretta Arab, PharmD []  Theodis Shove, PharmD []  Peggyann Juba, PharmD []  Reuel Boom, PharmD   Positive urine culture Treated with augmentin, organism sensitive to the same and no further patient follow-up is required at this time.  Hazle Nordmann 08/03/2020, 9:27 AM

## 2020-08-09 ENCOUNTER — Other Ambulatory Visit (INDEPENDENT_AMBULATORY_CARE_PROVIDER_SITE_OTHER): Payer: Self-pay | Admitting: Internal Medicine

## 2020-08-29 ENCOUNTER — Other Ambulatory Visit: Payer: Self-pay | Admitting: Family Medicine

## 2020-08-29 ENCOUNTER — Encounter (HOSPITAL_BASED_OUTPATIENT_CLINIC_OR_DEPARTMENT_OTHER): Payer: Self-pay | Admitting: Otolaryngology

## 2020-08-29 ENCOUNTER — Other Ambulatory Visit: Payer: Self-pay

## 2020-08-29 ENCOUNTER — Other Ambulatory Visit: Payer: Self-pay | Admitting: Otolaryngology

## 2020-08-30 ENCOUNTER — Encounter (HOSPITAL_BASED_OUTPATIENT_CLINIC_OR_DEPARTMENT_OTHER)
Admission: RE | Admit: 2020-08-30 | Discharge: 2020-08-30 | Disposition: A | Discharge status: Home / Self Care | Payer: Medicare Other | Source: Ambulatory Visit | Attending: Otolaryngology | Admitting: Otolaryngology

## 2020-08-30 DIAGNOSIS — Z882 Allergy status to sulfonamides status: Secondary | ICD-10-CM | POA: Diagnosis not present

## 2020-08-30 DIAGNOSIS — Z7951 Long term (current) use of inhaled steroids: Secondary | ICD-10-CM | POA: Diagnosis not present

## 2020-08-30 DIAGNOSIS — Z888 Allergy status to other drugs, medicaments and biological substances status: Secondary | ICD-10-CM | POA: Diagnosis not present

## 2020-08-30 DIAGNOSIS — Z887 Allergy status to serum and vaccine status: Secondary | ICD-10-CM | POA: Diagnosis not present

## 2020-08-30 DIAGNOSIS — Z886 Allergy status to analgesic agent status: Secondary | ICD-10-CM | POA: Diagnosis not present

## 2020-08-30 DIAGNOSIS — Z79899 Other long term (current) drug therapy: Secondary | ICD-10-CM | POA: Diagnosis not present

## 2020-08-30 DIAGNOSIS — Z7984 Long term (current) use of oral hypoglycemic drugs: Secondary | ICD-10-CM | POA: Diagnosis not present

## 2020-08-30 DIAGNOSIS — G4733 Obstructive sleep apnea (adult) (pediatric): Secondary | ICD-10-CM | POA: Diagnosis present

## 2020-08-30 DIAGNOSIS — Z881 Allergy status to other antibiotic agents status: Secondary | ICD-10-CM | POA: Diagnosis not present

## 2020-08-30 DIAGNOSIS — Z91040 Latex allergy status: Secondary | ICD-10-CM | POA: Diagnosis not present

## 2020-08-30 LAB — BASIC METABOLIC PANEL
Anion gap: 8 (ref 5–15)
BUN: 13 mg/dL (ref 8–23)
CO2: 21 mmol/L — ABNORMAL LOW (ref 22–32)
Calcium: 9.4 mg/dL (ref 8.9–10.3)
Chloride: 107 mmol/L (ref 98–111)
Creatinine, Ser: 1.1 mg/dL — ABNORMAL HIGH (ref 0.44–1.00)
GFR, Estimated: 53 mL/min — ABNORMAL LOW (ref >60)
Glucose, Bld: 87 mg/dL (ref 70–99)
Potassium: 4.6 mmol/L (ref 3.5–5.1)
Sodium: 136 mmol/L (ref 135–145)

## 2020-09-02 ENCOUNTER — Ambulatory Visit (HOSPITAL_BASED_OUTPATIENT_CLINIC_OR_DEPARTMENT_OTHER)
Admission: RE | Admit: 2020-09-02 | Discharge: 2020-09-02 | Disposition: A | Discharge status: Home / Self Care | Payer: Medicare Other | Attending: Otolaryngology | Admitting: Otolaryngology

## 2020-09-02 ENCOUNTER — Ambulatory Visit (HOSPITAL_BASED_OUTPATIENT_CLINIC_OR_DEPARTMENT_OTHER): Payer: Medicare Other | Admitting: Anesthesiology

## 2020-09-02 ENCOUNTER — Other Ambulatory Visit: Payer: Self-pay

## 2020-09-02 ENCOUNTER — Encounter (HOSPITAL_BASED_OUTPATIENT_CLINIC_OR_DEPARTMENT_OTHER): Payer: Self-pay | Admitting: Otolaryngology

## 2020-09-02 ENCOUNTER — Encounter (HOSPITAL_BASED_OUTPATIENT_CLINIC_OR_DEPARTMENT_OTHER)
Admission: RE | Disposition: A | Discharge status: Home / Self Care | Payer: Self-pay | Source: Home / Self Care | Attending: Otolaryngology

## 2020-09-02 DIAGNOSIS — Z886 Allergy status to analgesic agent status: Secondary | ICD-10-CM | POA: Diagnosis not present

## 2020-09-02 DIAGNOSIS — Z888 Allergy status to other drugs, medicaments and biological substances status: Secondary | ICD-10-CM | POA: Insufficient documentation

## 2020-09-02 DIAGNOSIS — Z79899 Other long term (current) drug therapy: Secondary | ICD-10-CM | POA: Insufficient documentation

## 2020-09-02 DIAGNOSIS — Z887 Allergy status to serum and vaccine status: Secondary | ICD-10-CM | POA: Insufficient documentation

## 2020-09-02 DIAGNOSIS — Z881 Allergy status to other antibiotic agents status: Secondary | ICD-10-CM | POA: Insufficient documentation

## 2020-09-02 DIAGNOSIS — G4733 Obstructive sleep apnea (adult) (pediatric): Secondary | ICD-10-CM | POA: Diagnosis not present

## 2020-09-02 DIAGNOSIS — Z7984 Long term (current) use of oral hypoglycemic drugs: Secondary | ICD-10-CM | POA: Insufficient documentation

## 2020-09-02 DIAGNOSIS — Z882 Allergy status to sulfonamides status: Secondary | ICD-10-CM | POA: Diagnosis not present

## 2020-09-02 DIAGNOSIS — Z7951 Long term (current) use of inhaled steroids: Secondary | ICD-10-CM | POA: Insufficient documentation

## 2020-09-02 DIAGNOSIS — Z91040 Latex allergy status: Secondary | ICD-10-CM | POA: Insufficient documentation

## 2020-09-02 HISTORY — PX: DRUG INDUCED ENDOSCOPY: SHX6808

## 2020-09-02 LAB — GLUCOSE, CAPILLARY
Glucose-Capillary: 91 mg/dL (ref 70–99)
Glucose-Capillary: 79 mg/dL (ref 70–99)

## 2020-09-02 SURGERY — DRUG INDUCED SLEEP ENDOSCOPY
Anesthesia: Monitor Anesthesia Care | Site: Nose

## 2020-09-02 MED ORDER — PROPOFOL 10 MG/ML IV BOLUS
INTRAVENOUS | Status: DC | PRN
  Administered 2020-09-02 (×3): 10 mg via INTRAVENOUS

## 2020-09-02 MED ORDER — PROPOFOL 500 MG/50ML IV EMUL
INTRAVENOUS | Status: DC | PRN
  Administered 2020-09-02: 50 ug/kg/min via INTRAVENOUS

## 2020-09-02 MED ORDER — MEPERIDINE HCL 25 MG/ML IJ SOLN: 6.2500 mg | INTRAMUSCULAR | Status: DC | PRN

## 2020-09-02 MED ORDER — OXYCODONE HCL 5 MG/5ML PO SOLN: 5.0000 mg | Freq: Once | ORAL | Status: DC | PRN

## 2020-09-02 MED ORDER — OXYCODONE HCL 5 MG PO TABS: 5.0000 mg | ORAL_TABLET | Freq: Once | ORAL | Status: DC | PRN

## 2020-09-02 MED ORDER — ACETAMINOPHEN 160 MG/5ML PO SOLN: 325.0000 mg | ORAL | Status: DC | PRN

## 2020-09-02 MED ORDER — ACETAMINOPHEN 325 MG PO TABS: 325.0000 mg | ORAL_TABLET | ORAL | Status: DC | PRN

## 2020-09-02 MED ORDER — FENTANYL CITRATE (PF) 100 MCG/2ML IJ SOLN: 25.0000 ug | INTRAMUSCULAR | Status: DC | PRN

## 2020-09-02 MED ORDER — LACTATED RINGERS IV SOLN: INTRAVENOUS | Status: DC

## 2020-09-02 SURGICAL SUPPLY — 16 items
CANISTER SUCT 1200ML W/VALVE (MISCELLANEOUS) ×3 IMPLANT
COVER WAND RF STERILE (DRAPES) IMPLANT
GLOVE SURG ENC TEXT LTX SZ7 (GLOVE) ×3 IMPLANT
GLOVE SURG UNDER POLY LF SZ7 (GLOVE) ×6 IMPLANT
KIT CLEAN ENDO (MISCELLANEOUS) ×3 IMPLANT
NEEDLE PRECISIONGLIDE 27X1.5 (NEEDLE) IMPLANT
PACK BASIN DAY SURGERY FS (CUSTOM PROCEDURE TRAY) ×3 IMPLANT
PATTIES SURGICAL .5 X3 (DISPOSABLE) IMPLANT
SHEET MEDIUM DRAPE 40X70 STRL (DRAPES) ×3 IMPLANT
SOL ANTI FOG 6CC (MISCELLANEOUS) ×1 IMPLANT
SOLUTION ANTI FOG 6CC (MISCELLANEOUS) ×2
SPONGE NEURO XRAY DETECT 1X3 (DISPOSABLE) IMPLANT
SYR CONTROL 10ML LL (SYRINGE) IMPLANT
TOWEL GREEN STERILE FF (TOWEL DISPOSABLE) ×3 IMPLANT
TUBE CONNECTING 20'X1/4 (TUBING)
TUBE CONNECTING 20X1/4 (TUBING) IMPLANT

## 2020-09-02 NOTE — Anesthesia Preprocedure Evaluation (Addendum)
Anesthesia Evaluation  Patient identified by MRN, date of birth, ID band Patient awake    Reviewed: Allergy & Precautions, H&P , NPO status , Patient's Chart, lab work & pertinent test results, reviewed documented beta blocker date and time   Airway Mallampati: II  TM Distance: >3 FB Neck ROM: full    Dental no notable dental hx. (+) Poor Dentition, Chipped, Missing, Loose,    Pulmonary shortness of breath, sleep apnea , pneumonia, resolved, COPD,    Pulmonary exam normal breath sounds clear to auscultation       Cardiovascular Exercise Tolerance: Good hypertension, Pt. on medications negative cardio ROS   Rhythm:regular Rate:Normal  ECHO 8/21 1. Left ventricular ejection fraction, by estimation, is 55 to 60%. The  left ventricle has normal function. The left ventricle has no regional  wall motion abnormalities. There is mild left ventricular hypertrophy.  Left ventricular diastolic parameters  are consistent with Grade I diastolic dysfunction (impaired relaxation).  2. Right ventricular systolic function is normal. The right ventricular  size is normal.  3. The mitral valve is normal in structure. No evidence of mitral valve  regurgitation. No evidence of mitral stenosis.  4. The aortic valve is tricuspid. Aortic valve regurgitation is not  visualized. No aortic stenosis is present.  5. IVC is small, suggesting low RA pressure and hypovolemia.    Neuro/Psych  Neuromuscular disease negative psych ROS   GI/Hepatic Neg liver ROS, GERD  Medicated,  Endo/Other  diabetesHypothyroidism   Renal/GU CRFRenal disease  negative genitourinary   Musculoskeletal  (+) Arthritis , Osteoarthritis,  Fibromyalgia -  Abdominal   Peds  Hematology  (+) Blood dyscrasia, anemia ,   Anesthesia Other Findings   Reproductive/Obstetrics negative OB ROS                           Anesthesia  Physical  Anesthesia Plan  ASA: 3  Anesthesia Plan: MAC   Post-op Pain Management:    Induction: Intravenous  PONV Risk Score and Plan: 3 and Propofol infusion  Airway Management Planned: Natural Airway and Simple Face Mask  Additional Equipment: None  Intra-op Plan:   Post-operative Plan:   Informed Consent: I have reviewed the patients History and Physical, chart, labs and discussed the procedure including the risks, benefits and alternatives for the proposed anesthesia with the patient or authorized representative who has indicated his/her understanding and acceptance.     Dental Advisory Given  Plan Discussed with: CRNA and Anesthesiologist  Anesthesia Plan Comments:        Anesthesia Quick Evaluation

## 2020-09-02 NOTE — Transfer of Care (Signed)
Immediate Anesthesia Transfer of Care Note  Patient: Heather Espinoza  Procedure(s) Performed: DRUG INDUCED ENDOSCOPY (Nose)  Patient Location: PACU  Anesthesia Type:MAC  Level of Consciousness: awake, alert  and oriented  Airway & Oxygen Therapy: Patient Spontanous Breathing and Patient connected to face mask oxygen  Post-op Assessment: Report given to RN and Post -op Vital signs reviewed and stable  Post vital signs: Reviewed and stable  Last Vitals:  Vitals Value Taken Time  BP    Temp    Pulse 69 09/02/20 1010  Resp    SpO2 96 % 09/02/20 1010  Vitals shown include unvalidated device data.  Last Pain:  Vitals:   09/02/20 0830  TempSrc: Oral  PainSc: 0-No pain         Complications: No notable events documented.

## 2020-09-02 NOTE — Op Note (Signed)
Operative Note: DRUG INDUCED SLEEP ENDOSCOPY  Patient: [place] record number: [id number]  Date:09/02/2020  Pre-operative Indications: 1.  Obstructive Sleep Apnea  Postoperative Indications: Same  Surgical Procedure: 1.  Drug Induced Sleep Endoscopy (DISE)  Anesthesia: MAC with IV sedation  Surgeon: Delsa Bern, M.D.  Complications: None  EBL: None  Findings: There is no evidence of complete concentric palatal obstruction.  Anatomically the patient should be a candidate for hypoglossal nerve stimulation therapy.     Brief History: The patient is a 75 y.o. female with a history of obstructive sleep apnea. The patient has undergone previous sleep study which showed mild to moderate levels of obstructive sleep apnea.  The patient was prescribed CPAP which they consistently attempted to use without success.  Given the patient's history and findings, the above drug-induced sleep endoscopy was recommended to assess the patient's anatomic level of apnea.  Risks and benefits were discussed in detail with the patient today understand and agree with our plan for surgery which is scheduled at [facility] under sedated anesthesia as an outpatient.  Surgical Procedure: The patient is brought to the operating room on 09/02/2020 and placed in supine position on the operating table.  Intravenous sedated anesthesia was established without difficulty using the standard drug-induced sleep endoscopy protocol. When the patient was adequately anesthetized, surgical timeout was performed and correct identification of the patient and the surgical procedure.    A propofol infusion was administered and the patient was monitored carefully to achieve a level of sedation appropriate for DISE.  The patient did not respond to verbal commands but still had spontaneous respiration, sleep disordered breathing and associated desaturations were observed.  With the patient under adequate sedated anesthesia  the flexible nasal laryngoscope was passed without difficulty.  The patient's nasal cavity showed mild septal deviation, patent airway.  The endoscope was then passed to visualize the velopharynx, oropharynx, tongue base and epiglottis to assess areas of obstruction.  Patient's airway showed anterior to posterior obstruction of the soft palate and tongue base.   There was no evidence of complete concentric palatal obstruction and the patient appeared to be a candidate anatomically for hypoglossal nerve stimulation therapy.  Surgical sponge count was correct. Patient was awakened from anesthetic and transferred from the operating room to the recovery room in stable condition. There were no complications and no blood loss.   Delsa Bern, M.D. [facility] 09/02/2020

## 2020-09-02 NOTE — Discharge Instructions (Signed)

## 2020-09-02 NOTE — H&P (Signed)
Heather Espinoza is an 75 y.o. female.   Chief Complaint: Obstructive sleep apnea HPI: History of obstructive sleep apnea, unable to tolerate CPAP.  Past Medical History:  Diagnosis Date   Arthritis    COPD (chronic obstructive pulmonary disease) (HCC)    Diabetes ([place])    Diverticulitis    Diverticulosis    Dyspnea    Fibromyalgia    High cholesterol    History of kidney stones    Hypertension    Hypothyroid    IBS (irritable bowel syndrome)    Osteopenia    Pneumonia    Restless leg    Sleep apnea    Doesn't use CPAP.     Tachycardia    per pt/fim   Tremor, essential 01/07/2018    Past Surgical History:  Procedure Laterality Date   APPENDECTOMY     BACK SURGERY     BIOPSY  12/25/2016   Procedure: BIOPSY;  Surgeon: Rogene Houston, MD;  [facility];  Service: Endoscopy;;  gastric    BREAST LUMPECTOMY Right 03/20/1999   CATARACT EXTRACTION Bilateral 03/19/2014   CHOLECYSTECTOMY     COLONOSCOPY N/A 12/17/2012   Procedure: COLONOSCOPY;  Surgeon: Rogene Houston, MD;  [facility]: [facility];  Service: Endoscopy;  Laterality: N/A;  215   COLONOSCOPY WITH PROPOFOL N/A 07/24/2019   Procedure: COLONOSCOPY WITH PROPOFOL;  Surgeon: Rogene Houston, MD;  [facility];  Service: Endoscopy;  Laterality: N/A;  730   complete hysterectomy     ESOPHAGEAL DILATION N/A 12/25/2016   Procedure: ESOPHAGEAL DILATION;  Surgeon: Rogene Houston, MD;  [facility];  Service: Endoscopy;  Laterality: N/A;   ESOPHAGEAL DILATION N/A 07/24/2019   Procedure: ESOPHAGEAL DILATION;  Surgeon: Rogene Houston, MD;  [facility];  Service: Endoscopy;  Laterality: N/A;   ESOPHAGOGASTRODUODENOSCOPY N/A 12/25/2016   Procedure: ESOPHAGOGASTRODUODENOSCOPY (EGD);  Surgeon: Rogene Houston, MD;  [facility];  Service: Endoscopy;  Laterality: N/A;  730   ESOPHAGOGASTRODUODENOSCOPY (EGD) WITH PROPOFOL N/A 07/24/2019   Procedure:  ESOPHAGOGASTRODUODENOSCOPY (EGD) WITH PROPOFOL;  Surgeon: Rogene Houston, MD;  [facility];  Service: Endoscopy;  Laterality: N/A;   Heel tumor removed     POLYPECTOMY  07/24/2019   Procedure: POLYPECTOMY;  Surgeon: Rogene Houston, MD;  [facility];  Service: Endoscopy;;   rt elbow surgery     TONSILLECTOMY      Family History  Problem Relation Age of Onset   Uterine cancer Mother    Parkinson's disease Father    Stroke Father    Colon cancer Brother    Lung cancer Brother    COPD Sister    Diabetes Grandchild    Asthma Son    Social History:  reports that she has never smoked. She has never used smokeless tobacco. She reports that she does not drink alcohol and does not use drugs.  Allergies:  Allergies  Allergen Reactions   Flagyl [Metronidazole] Shortness Of Breath and Swelling   Lyrica [Pregabalin] Shortness Of Breath   Adhesive [Tape] Other (See Comments)    Takes off patient's skin   Aspirin Other (See Comments)    Stomach bleed   Latex Itching and Rash   Phenergan [Promethazine Hcl] Other (See Comments)    Caused grogginess, altered mental status   Prednisone Anxiety   Sulfa Antibiotics Nausea Only   Tetanus Toxoids Swelling    Arm Area    Medications  Prior to Admission  Medication Sig Dispense Refill   acetaminophen (TYLENOL) 500 MG tablet Take 1,000 mg by mouth every 6 (six) hours as needed (for pain.).     albuterol (PROVENTIL HFA;VENTOLIN HFA) 108 (90 Base) MCG/ACT inhaler Inhale 2 puffs into the lungs every 6 (six) hours as needed for wheezing or shortness of breath.     dicyclomine (BENTYL) 10 MG capsule Take 1 capsule (10 mg total) by mouth 3 (three) times daily as needed for spasms. (Patient taking differently: Take 10 mg by mouth 3 (three) times daily as needed for spasms (IBS/Diverticulitis symptoms.).) 90 capsule 5   famotidine (PEPCID) 40 MG tablet TAKE 1 TABLET BY MOUTH AT BEDTIME 90 tablet 0   Fluticasone-Salmeterol  (ADVAIR) 250-50 MCG/DOSE AEPB Inhale 1 puff into the lungs every 12 (twelve) hours.     gabapentin (NEURONTIN) 100 MG capsule Take 100 mg by mouth 3 (three) times daily.     levothyroxine (SYNTHROID, LEVOTHROID) 75 MCG tablet Take 37.5 mcg by mouth daily before breakfast.      lisinopril (PRINIVIL,ZESTRIL) 10 MG tablet Take 10 mg by mouth daily.      metFORMIN (GLUCOPHAGE-XR) 500 MG 24 hr tablet Take 500 mg by mouth at bedtime.     ondansetron (ZOFRAN) 4 MG tablet Take 1 tablet (4 mg total) by mouth every 6 (six) hours. 12 tablet 0   polyethylene glycol powder (GLYCOLAX/MIRALAX) 17 GM/SCOOP powder Take 8.5 g by mouth daily. (Patient taking differently: Take 8.5 g by mouth daily as needed (constipation.).) 255 g 5   simvastatin (ZOCOR) 20 MG tablet Take 20 mg by mouth every evening.     traMADol (ULTRAM) 50 MG tablet Take 50-100 mg by mouth every 6 (six) hours as needed (back pain.).      traZODone (DESYREL) 100 MG tablet Take 50 mg by mouth at bedtime.     Vitamin D, Ergocalciferol, (DRISDOL) 1.25 MG (50000 UNIT) CAPS capsule Take 50,000 Units by mouth once a week.     docusate sodium (COLACE) 100 MG capsule Take 2 capsules (200 mg total) by mouth at bedtime. (Patient taking differently: Take 200 mg by mouth daily as needed (constipation.).) 10 capsule 0   esomeprazole (NEXIUM) 40 MG capsule Take 1 capsule (40 mg total) by mouth daily before breakfast. 30 capsule 5    Results for orders placed or performed during the hospital encounter of 09/02/20 (from the past 48 hour(s))  Glucose, capillary     Status: None   Collection Time: 09/02/20  8:37 AM  Result Value Ref Range   Glucose-Capillary 91 70 - 99 mg/dL    Comment: Glucose reference range applies only to samples taken after fasting for at least 8 hours.   No results found.  Review of Systems  Respiratory:  Positive for apnea.    Blood pressure 118/69, pulse 76, temperature 98.1 F (36.7 C), temperature source Oral, resp. rate 18,  height 5' 1.5" (1.562 m), weight 56.7 kg, SpO2 99 %. Physical Exam Constitutional:      Appearance: She is normal weight.  Cardiovascular:     Rate and Rhythm: Normal rate.  Pulmonary:     Effort: Pulmonary effort is normal.  Neurological:     Mental Status: She is alert.     Assessment/Plan Patient admitted for drug-induced sleep endoscopy to assess airway obstruction.  Jerrell Belfast, MD 09/02/2020, 9:34 AM

## 2020-09-02 NOTE — Anesthesia Postprocedure Evaluation (Signed)
Anesthesia Post Note  Patient: Heather Espinoza  Procedure(s) Performed: DRUG INDUCED ENDOSCOPY (Nose)     Patient location during evaluation: PACU Anesthesia Type: MAC Level of consciousness: awake and alert Pain management: pain level controlled Vital Signs Assessment: post-procedure vital signs reviewed and stable Respiratory status: spontaneous breathing, nonlabored ventilation, respiratory function stable and patient connected to nasal cannula oxygen Cardiovascular status: stable and blood pressure returned to baseline Postop Assessment: no apparent nausea or vomiting Anesthetic complications: no   No notable events documented.  Last Vitals:  Vitals:   09/02/20 1015 09/02/20 1040  BP: (!) 120/56 128/70  Pulse: 65 70  Resp: 15 16  Temp:  36.7 C  SpO2: 100% 97%    Last Pain:  Vitals:   09/02/20 1040  TempSrc:   PainSc: 0-No pain                 Akela Pocius

## 2020-09-04 ENCOUNTER — Encounter (HOSPITAL_BASED_OUTPATIENT_CLINIC_OR_DEPARTMENT_OTHER): Payer: Self-pay | Admitting: Otolaryngology

## 2020-09-16 ENCOUNTER — Other Ambulatory Visit (INDEPENDENT_AMBULATORY_CARE_PROVIDER_SITE_OTHER): Payer: Self-pay | Admitting: Internal Medicine

## 2020-09-16 ENCOUNTER — Other Ambulatory Visit: Payer: Self-pay | Admitting: Neurology

## 2020-09-27 ENCOUNTER — Encounter (INDEPENDENT_AMBULATORY_CARE_PROVIDER_SITE_OTHER): Payer: Self-pay | Admitting: Internal Medicine

## 2020-09-27 ENCOUNTER — Other Ambulatory Visit: Payer: Self-pay

## 2020-09-27 ENCOUNTER — Ambulatory Visit (INDEPENDENT_AMBULATORY_CARE_PROVIDER_SITE_OTHER): Payer: Medicare Other | Admitting: Internal Medicine

## 2020-09-27 VITALS — BP 140/66 | HR 73 | Temp 98.7°F | Ht 61.0 in | Wt 126.0 lb

## 2020-09-27 DIAGNOSIS — R1319 Other dysphagia: Secondary | ICD-10-CM

## 2020-09-27 DIAGNOSIS — K582 Mixed irritable bowel syndrome: Secondary | ICD-10-CM | POA: Diagnosis not present

## 2020-09-27 DIAGNOSIS — K219 Gastro-esophageal reflux disease without esophagitis: Secondary | ICD-10-CM

## 2020-09-27 MED ORDER — ESOMEPRAZOLE MAGNESIUM 40 MG PO CPDR: 40.0000 mg | DELAYED_RELEASE_CAPSULE | Freq: Every day | ORAL | 1 refills | Status: DC

## 2020-09-27 MED ORDER — FAMOTIDINE 40 MG PO TABS: 40.0000 mg | ORAL_TABLET | Freq: Every day | ORAL | 1 refills | Status: DC

## 2020-09-27 MED ORDER — DICYCLOMINE HCL 10 MG PO CAPS: 10.0000 mg | ORAL_CAPSULE | Freq: Two times a day (BID) | ORAL | 1 refills | Status: DC

## 2020-09-27 NOTE — Progress Notes (Signed)
Follow for chronic GERD and IBS. Patient complains of swallowing difficulty.  Database and subjective:  Patient is 75 year old Caucasian female with history of IBS, chronic nausea and GERD who is here for scheduled visit.  Her last visit was virtual visit on 03/29/2020. She says she is doing reasonably well as far as heartburn is concerned.  She has to take famotidine at bedtime otherwise she has nocturnal heartburn.  She remains with chronic nausea.  She is taking Zofran/ondansetron on most daily.  She continues to complain of lower abdominal cramping as well as irregular bowel movements.  When she is having diarrhea she can have 3-4 bowel movements per day.  When she is constipated she can for 5 days without a bowel movement.  In between she can have normal bowel movements.  She denies melena or rectal bleeding.  Her appetite is not good.  Her weight is down by 2 pounds.  On most days she eats 1 meal a day.  She says she has 2 puppies and has plenty of exercise. She also complains of dysphagia to pills.  Dysphagia started couple of months ago.  She did get relief following esophageal dilation back in May 2021.  She points to suprasternal area sort of bolus obstruction.  She also has difficulty with solids but not soft foods.  She has had food impaction which she is able to get relieved by self-induced vomiting.  She denies hematemesis. She says she has moderate sleep apnea.  She has not been able to use CPAP.  She is in the process of getting inspire. She takes dicyclomine on as-needed basis.  On some days she And on some days she takes as many as 3/day.  Patient says she has not been able to tolerate Metamucil. She says she lost 7 pounds when she had pneumonia back in 2020.  She states she dropped down 215 and gradually has gained over 10 pounds.  Patient says she was seen in emergency room on 07/30/2020 for vomiting and diarrhea and worsening abdominal pain.  She was advised that she may have  diverticulitis but she did not.  She had UTI.  She was discharged on Augmentin.  She underwent esophagogastroduodenoscopy for esophageal dysphagia on 07/24/2019 revealing small sliding hiatal hernia and esophageal web disrupted by passing 38 [place] dilator.  She underwent high rescreening colonoscopy back in May 2021.  She had 10 mm polyp removed from hepatic flexure which was a tubular adenoma.  Polyp from splenic flexure was not an adenoma.   Current Medications: Outpatient Encounter Medications as of 09/27/2020  Medication Sig   acetaminophen (TYLENOL) 500 MG tablet Take 1,000 mg by mouth every 6 (six) hours as needed (for pain.).   albuterol (PROVENTIL HFA;VENTOLIN HFA) 108 (90 Base) MCG/ACT inhaler Inhale 2 puffs into the lungs every 6 (six) hours as needed for wheezing or shortness of breath.   aspirin EC 81 MG tablet Take 81 mg by mouth daily. Swallow whole.   dicyclomine (BENTYL) 10 MG capsule Take 1 capsule (10 mg total) by mouth 3 (three) times daily as needed for spasms.   docusate sodium (COLACE) 100 MG capsule Take 2 capsules (200 mg total) by mouth at bedtime.   esomeprazole (NEXIUM) 40 MG capsule Take 1 capsule (40 mg total) by mouth daily before breakfast.   famotidine (PEPCID) 40 MG tablet TAKE 1 TABLET BY MOUTH AT BEDTIME   Fluticasone-Salmeterol (ADVAIR) 250-50 MCG/DOSE AEPB Inhale 1 puff into the lungs every 12 (twelve) hours.  gabapentin (NEURONTIN) 100 MG capsule Take 100 mg by mouth 3 (three) times daily.   levothyroxine (SYNTHROID, LEVOTHROID) 75 MCG tablet Take 37.5 mcg by mouth daily before breakfast.    lisinopril (PRINIVIL,ZESTRIL) 10 MG tablet Take 10 mg by mouth daily.    metFORMIN (GLUCOPHAGE-XR) 500 MG 24 hr tablet Take 500 mg by mouth at bedtime.   ondansetron (ZOFRAN) 4 MG tablet TAKE 1 TABLET BY MOUTH TWICE DAILY AS NEEDED FOR NAUSEA OR VOMITING   polyethylene glycol powder (GLYCOLAX/MIRALAX) 17 GM/SCOOP powder Take 8.5 g by mouth daily.   simvastatin  (ZOCOR) 20 MG tablet Take 20 mg by mouth every evening.   traMADol (ULTRAM) 50 MG tablet Take 50-100 mg by mouth every 6 (six) hours as needed (back pain.).    traZODone (DESYREL) 100 MG tablet Take 50 mg by mouth at bedtime.   [DISCONTINUED] Vitamin D, Ergocalciferol, (DRISDOL) 1.25 MG (50000 UNIT) CAPS capsule Take 50,000 Units by mouth once a week. (Patient not taking: Reported on 09/27/2020)   No facility-administered encounter medications on file as of 09/27/2020.     Objective: Blood pressure 140/66, pulse 73, temperature 98.7 F (37.1 C), temperature source Oral, height 5' 1"  (1.549 m), weight 126 lb (57.2 kg). Patient is alert and in no acute distress. She is wearing a mask. Conjunctiva is pink. Sclera is nonicteric Oropharyngeal mucosa is normal. No neck masses or thyromegaly noted. Cardiac exam with regular rhythm normal S1 and S2. No murmur or gallop noted. Lungs are clear to auscultation. Abdomen;  No LE edema or clubbing noted.  Labs/studies Results:   CBC Latest Ref Rng & Units 07/30/2020 07/30/2020 05/20/2019  WBC 4.0 - 10.5 K/uL - 7.4 7.7  Hemoglobin 12.0 - 15.0 g/dL 10.9(L) 11.0(L) 10.9 Repeated and verified X2.(L)  Hematocrit 36.0 - 46.0 % 32.0(L) 33.5(L) 32.2(L)  Platelets 150 - 400 K/uL - 315 300.0    CMP Latest Ref Rng & Units 08/30/2020 07/30/2020 07/30/2020  Glucose 70 - 99 mg/dL 87 88 87  BUN 8 - 23 mg/dL 13 26(H) 27(H)  Creatinine 0.44 - 1.00 mg/dL 1.10(H) 1.60(H) 1.67(H)  Sodium 135 - 145 mmol/L 136 141 135  Potassium 3.5 - 5.1 mmol/L 4.6 3.9 3.8  Chloride 98 - 111 mmol/L 107 110 109  CO2 22 - 32 mmol/L 21(L) - 20(L)  Calcium 8.9 - 10.3 mg/dL 9.4 - 9.1  Total Protein 6.5 - 8.1 g/dL - - 6.9  Total Bilirubin 0.3 - 1.2 mg/dL - - 0.5  Alkaline Phos 38 - 126 U/L - - 40  AST 15 - 41 U/L - - 26  ALT 0 - 44 U/L - - 22    Hepatic Function Latest Ref Rng & Units 07/30/2020 05/20/2019 02/03/2019  Total Protein 6.5 - 8.1 g/dL 6.9 6.9 5.5(L)  Albumin 3.5 - 5.0 g/dL  3.7 3.9 2.3(L)  AST 15 - 41 U/L 26 20 12(L)  ALT 0 - 44 U/L 22 18 9   Alk Phosphatase 38 - 126 U/L 40 50 79  Total Bilirubin 0.3 - 1.2 mg/dL 0.5 0.3 0.7  Bilirubin, Direct 0.00 - 0.40 mg/dL - - -    Abdominal pelvic CT films from 07/30/2020 reviewed. 10 mm stone at lower pole of left kidney no hydronephrosis.  Left-sided diverticulosis without diverticulitis.  Aortic atherosclerosis.   Assessment:  #1.  Chronic GERD.  She is doing well with combination of PPI and H2's. B.  EGD 14 months ago did not reveal erosive esophagitis or Barrett's esophagus. Will continue present  therapy for now.  #2.  Esophageal dysphagia.  She has a history of esophageal valve which was dilated disrupted in May 2021 providing symptomatic relief.  Now she is having dysphagia to pills and solids.  We will proceed with barium study before considering repeat dilation.  #3.  Chronic nausea.  Nausea appears to be multifactorial.  Medications may be 1 source.  She is getting relief with ondansetron.  #4.  Patient is high risk for colorectal carcinoma.  She had 10 mm tubular adenoma removed in May 2021.  Family history is positive for CRC in brother younger than 63.  Next colonoscopy due in May 2026.   Plan:  Patient advised to take dicyclomine 10 mg 30 minutes before breakfast daily. Try Benefiber or fiber Gummies 2 to 4 g/day. Patient advised to use glycerin or Dulcolax suppository if she goes more than 1 day without a bowel movement. New prescription sent to patient's pharmacy for dicyclomine esomeprazole and famotidine. Barium pill esophagogram. Office visit in 6 months.

## 2020-09-27 NOTE — Patient Instructions (Addendum)
Take dicyclomine 30 minutes before breakfast and and second dose on as-needed basis Use glycerin or Dulcolax suppository if you go 1 day without a bowel movement. Try Benefiber or fiber Gummies 2 to 4 g/day.  Can start with low-dose and gradually increase to 4 g daily.

## 2020-09-28 ENCOUNTER — Encounter (INDEPENDENT_AMBULATORY_CARE_PROVIDER_SITE_OTHER): Payer: Self-pay

## 2020-09-28 DIAGNOSIS — R1030 Lower abdominal pain, unspecified: Secondary | ICD-10-CM | POA: Insufficient documentation

## 2020-09-28 DIAGNOSIS — R1032 Left lower quadrant pain: Secondary | ICD-10-CM | POA: Insufficient documentation

## 2020-09-28 LAB — CELIAC DISEASE PANEL
(tTG) Ab, IgA: <1 U/mL
(tTG) Ab, IgG: <1 U/mL
Gliadin IgA: 32.3 U/mL — ABNORMAL HIGH
Gliadin IgG: <1 U/mL
Immunoglobulin A: 230 mg/dL (ref 70–320)

## 2020-09-30 ENCOUNTER — Ambulatory Visit (HOSPITAL_COMMUNITY): Payer: Medicare Other

## 2020-10-14 ENCOUNTER — Other Ambulatory Visit (HOSPITAL_COMMUNITY): Payer: Medicare Other

## 2020-10-24 ENCOUNTER — Other Ambulatory Visit: Payer: Self-pay | Admitting: Otolaryngology

## 2020-11-02 ENCOUNTER — Other Ambulatory Visit (HOSPITAL_COMMUNITY): Payer: Medicare Other

## 2020-11-03 ENCOUNTER — Other Ambulatory Visit (INDEPENDENT_AMBULATORY_CARE_PROVIDER_SITE_OTHER): Payer: Self-pay | Admitting: Internal Medicine

## 2020-11-11 ENCOUNTER — Emergency Department (HOSPITAL_COMMUNITY)
Admission: EM | Admit: 2020-11-11 | Discharge: 2020-11-11 | Disposition: A | Discharge status: Home / Self Care | Payer: Medicare Other | Attending: Emergency Medicine | Admitting: Emergency Medicine

## 2020-11-11 ENCOUNTER — Other Ambulatory Visit: Payer: Self-pay

## 2020-11-11 ENCOUNTER — Encounter (HOSPITAL_COMMUNITY): Payer: Self-pay

## 2020-11-11 DIAGNOSIS — E1122 Type 2 diabetes mellitus with diabetic chronic kidney disease: Secondary | ICD-10-CM | POA: Insufficient documentation

## 2020-11-11 DIAGNOSIS — Z79899 Other long term (current) drug therapy: Secondary | ICD-10-CM | POA: Diagnosis not present

## 2020-11-11 DIAGNOSIS — E039 Hypothyroidism, unspecified: Secondary | ICD-10-CM | POA: Diagnosis not present

## 2020-11-11 DIAGNOSIS — Z91040 Latex allergy status: Secondary | ICD-10-CM | POA: Insufficient documentation

## 2020-11-11 DIAGNOSIS — Z7984 Long term (current) use of oral hypoglycemic drugs: Secondary | ICD-10-CM | POA: Diagnosis not present

## 2020-11-11 DIAGNOSIS — I129 Hypertensive chronic kidney disease with stage 1 through stage 4 chronic kidney disease, or unspecified chronic kidney disease: Secondary | ICD-10-CM | POA: Diagnosis not present

## 2020-11-11 DIAGNOSIS — U071 COVID-19: Secondary | ICD-10-CM

## 2020-11-11 DIAGNOSIS — N183 Chronic kidney disease, stage 3 unspecified: Secondary | ICD-10-CM | POA: Diagnosis not present

## 2020-11-11 DIAGNOSIS — R509 Fever, unspecified: Secondary | ICD-10-CM | POA: Diagnosis present

## 2020-11-11 DIAGNOSIS — J449 Chronic obstructive pulmonary disease, unspecified: Secondary | ICD-10-CM | POA: Diagnosis not present

## 2020-11-11 HISTORY — DX: COVID-19: U07.1

## 2020-11-11 LAB — RESP PANEL BY RT-PCR (FLU A&B, COVID) ARPGX2
Influenza A by PCR: NEGATIVE
Influenza B by PCR: NEGATIVE
SARS Coronavirus 2 by RT PCR: POSITIVE — AB

## 2020-11-11 LAB — BASIC METABOLIC PANEL
Anion gap: 8 (ref 5–15)
BUN: 13 mg/dL (ref 8–23)
CO2: 24 mmol/L (ref 22–32)
Calcium: 9.2 mg/dL (ref 8.9–10.3)
Chloride: 104 mmol/L (ref 98–111)
Creatinine, Ser: 1.37 mg/dL — ABNORMAL HIGH (ref 0.44–1.00)
GFR, Estimated: 41 mL/min — ABNORMAL LOW (ref >60)
Glucose, Bld: 102 mg/dL — ABNORMAL HIGH (ref 70–99)
Potassium: 4.1 mmol/L (ref 3.5–5.1)
Sodium: 136 mmol/L (ref 135–145)

## 2020-11-11 MED ORDER — NIRMATRELVIR/RITONAVIR (PAXLOVID) TABLET (RENAL DOSING): 2.0000 | ORAL_TABLET | Freq: Two times a day (BID) | ORAL | 0 refills | Status: AC

## 2020-11-11 MED ORDER — ACETAMINOPHEN 325 MG PO TABS
650.0000 mg | ORAL_TABLET | Freq: Once | ORAL | Status: AC
  Administered 2020-11-11: 650 mg via ORAL
  Filled 2020-11-11: qty 2

## 2020-11-11 MED ORDER — ONDANSETRON 4 MG PO TBDP
4.0000 mg | ORAL_TABLET | Freq: Once | ORAL | Status: AC
  Administered 2020-11-11: 4 mg via ORAL
  Filled 2020-11-11: qty 1

## 2020-11-11 NOTE — ED Provider Notes (Signed)
[facility] EMERGENCY DEPARTMENT Provider Note   CSN: [id number] Arrival date & time: 11/11/20  1027     History Chief Complaint  Patient presents with   Fever    Heather Espinoza is a 75 y.o. female.  The history is provided by the patient.  Fever Associated symptoms: congestion, cough and vomiting   Associated symptoms: no confusion and no rash   Patient presents with fever some vomiting mild cough.  Patient states her husband has COVID.  She had the [place] but it was about a year and a half ago.  Has not had a booster.  States she began to feel bad yesterday.  Fever upon arrival.  No diarrhea.  Does have mild history of COPD.  No chest pain.  Mild myalgias.    Past Medical History:  Diagnosis Date   Arthritis    COPD (chronic obstructive pulmonary disease) (HCC)    Diabetes ([place])    Diverticulitis    Diverticulosis    Dyspnea    Fibromyalgia    High cholesterol    History of kidney stones    Hypertension    Hypothyroid    IBS (irritable bowel syndrome)    Osteopenia    Pneumonia    Restless leg    Sleep apnea    Doesn't use CPAP.     Tachycardia    per pt/fim   Tremor, essential 01/07/2018    Patient Active Problem List   Diagnosis Date Noted   Lower abdominal pain 09/28/2020   Obstructive sleep apnea 06/01/2020   Snoring 01/29/2020   At risk for obstructive sleep apnea 10/22/2019   Allergic rhinitis 10/22/2019   Constipation 06/30/2019   Nausea without vomiting 03/17/2019   Diarrhea 03/17/2019   CKD (chronic kidney disease), stage III ([place]) 02/03/2019   Acute blood loss anemia 02/03/2019   Parotitis 02/03/2019   Cellulitis of face 02/03/2019   Spondylolisthesis, lumbar region 01/12/2019   Tachycardia 01/07/2019   Syncope and collapse 01/07/2019   Palpitations 07/01/2018   SOB (shortness of breath) 07/01/2018   Educated about COVID-19 virus infection 07/01/2018   Lobar pneumonia ([place]) 04/10/2018   Streptococcus pneumoniae  pneumonia ([place]) 04/10/2018   Prolonged QT interval 04/10/2018   Tremor, essential 01/07/2018   Esophageal dysphagia 12/21/2016   Primary osteoarthritis of first carpometacarpal joint of left hand 12/11/2016   Status post wrist surgery 12/11/2016   Chronic low back pain 10/07/2015   IBS (irritable bowel syndrome) 02/17/2013   GERD (gastroesophageal reflux disease) 02/17/2013   Depression 02/17/2013   Family hx of colon cancer 11/24/2012   Essential hypertension, benign 11/24/2012   High cholesterol 11/24/2012   Diabetes ([place]) 11/24/2012   Fibromyalgia 11/24/2012   Acquired hypothyroidism 11/24/2012    Past Surgical History:  Procedure Laterality Date   APPENDECTOMY     BACK SURGERY     BIOPSY  12/25/2016   Procedure: BIOPSY;  Surgeon: Rogene Houston, MD;  [facility];  Service: Endoscopy;;  gastric    BREAST LUMPECTOMY Right 03/20/1999   CATARACT EXTRACTION Bilateral 03/19/2014   CHOLECYSTECTOMY     COLONOSCOPY N/A 12/17/2012   Procedure: COLONOSCOPY;  Surgeon: Rogene Houston, MD;  [facility]: [facility];  Service: Endoscopy;  Laterality: N/A;  215   COLONOSCOPY WITH PROPOFOL N/A 07/24/2019   Procedure: COLONOSCOPY WITH PROPOFOL;  Surgeon: Rogene Houston, MD;  [facility];  Service: Endoscopy;  Laterality: N/A;  730   complete hysterectomy  DRUG INDUCED ENDOSCOPY N/A 09/02/2020   Procedure: DRUG INDUCED ENDOSCOPY;  Surgeon: Jerrell Belfast, MD;  Location: [place];  Service: ENT;  Laterality: N/A;   ESOPHAGEAL DILATION N/A 12/25/2016   Procedure: ESOPHAGEAL DILATION;  Surgeon: Rogene Houston, MD;  [facility];  Service: Endoscopy;  Laterality: N/A;   ESOPHAGEAL DILATION N/A 07/24/2019   Procedure: ESOPHAGEAL DILATION;  Surgeon: Rogene Houston, MD;  [facility];  Service: Endoscopy;  Laterality: N/A;   ESOPHAGOGASTRODUODENOSCOPY N/A 12/25/2016   Procedure: ESOPHAGOGASTRODUODENOSCOPY (EGD);  Surgeon:  Rogene Houston, MD;  [facility];  Service: Endoscopy;  Laterality: N/A;  730   ESOPHAGOGASTRODUODENOSCOPY (EGD) WITH PROPOFOL N/A 07/24/2019   Procedure: ESOPHAGOGASTRODUODENOSCOPY (EGD) WITH PROPOFOL;  Surgeon: Rogene Houston, MD;  [facility];  Service: Endoscopy;  Laterality: N/A;   Heel tumor removed     POLYPECTOMY  07/24/2019   Procedure: POLYPECTOMY;  Surgeon: Rogene Houston, MD;  [facility];  Service: Endoscopy;;   rt elbow surgery     TONSILLECTOMY       OB History   No obstetric history on file.     Family History  Problem Relation Age of Onset   Uterine cancer Mother    Parkinson's disease Father    Stroke Father    Colon cancer Brother    Lung cancer Brother    COPD Sister    Diabetes Grandchild    Asthma Son     Social History   Tobacco Use   Smoking status: Never   Smokeless tobacco: Never  Vaping Use   Vaping Use: Never used  Substance Use Topics   Alcohol use: No    Alcohol/week: 0.0 standard drinks   Drug use: No    Home Medications Prior to Admission medications   Medication Sig Start Date End Date Taking? Authorizing Provider  nirmatrelvir/ritonavir EUA, renal dosing, (PAXLOVID) 10 x 150 MG & 10 x '100MG'$  TABS Take 2 tablets by mouth 2 (two) times daily for 5 days. Patient GFR is 40. Take nirmatrelvir (150 mg) one tablet twice daily for 5 days and ritonavir (100 mg) one tablet twice daily for 5 days. 11/11/20 11/16/20 Yes Davonna Belling, MD  acetaminophen (TYLENOL) 500 MG tablet Take 1,000 mg by mouth every 6 (six) hours as needed for moderate pain.    [provider]  albuterol (PROVENTIL HFA;VENTOLIN HFA) 108 (90 Base) MCG/ACT inhaler Inhale 2 puffs into the lungs every 6 (six) hours as needed for wheezing or shortness of breath.    [provider]  Cyanocobalamin (B-12) 2500 MCG TABS Take 2,500 mcg by mouth daily.    [provider]  dicyclomine (BENTYL) 10 MG capsule Take 1  capsule (10 mg total) by mouth 2 (two) times daily before a meal. 09/27/20   Rehman, Mechele Dawley, MD  docusate sodium (COLACE) 100 MG capsule Take 2 capsules (200 mg total) by mouth at bedtime. Patient taking differently: Take 100 mg by mouth daily as needed for moderate constipation. 01/04/15   Rehman, Mechele Dawley, MD  DULoxetine (CYMBALTA) 30 MG capsule Take 30 mg by mouth daily.    [provider]  esomeprazole (NEXIUM) 40 MG capsule Take 1 capsule (40 mg total) by mouth daily before breakfast. 09/27/20   Rehman, Mechele Dawley, MD  famotidine (PEPCID) 40 MG tablet Take 1 tablet (40 mg total) by mouth at bedtime. 09/27/20   Rogene Houston, MD  Fluticasone-Salmeterol (ADVAIR) 250-50 MCG/DOSE  AEPB Inhale 1 puff into the lungs every 12 (twelve) hours.    [provider]  gabapentin (NEURONTIN) 100 MG capsule Take 100 mg by mouth 2 (two) times daily.    [provider]  levothyroxine (SYNTHROID, LEVOTHROID) 75 MCG tablet Take 37.5 mcg by mouth daily before breakfast.     [provider]  lisinopril (PRINIVIL,ZESTRIL) 10 MG tablet Take 10 mg by mouth in the morning.    [provider]  metFORMIN (GLUCOPHAGE-XR) 500 MG 24 hr tablet Take 500 mg by mouth daily with supper. 01/12/19   [provider]  neomycin-bacitracin-polymyxin (NEOSPORIN) ointment Apply 1 application topically as needed for wound care.    [provider]  ondansetron (ZOFRAN) 4 MG tablet TAKE 1 TABLET BY MOUTH TWICE DAILY AS NEEDED FOR NAUSEA OR VOMITING 11/03/20   Rehman, Mechele Dawley, MD  polyethylene glycol powder (GLYCOLAX/MIRALAX) 17 GM/SCOOP powder Take 8.5 g by mouth daily. Patient not taking: No sig reported 06/30/19   Rogene Houston, MD  simvastatin (ZOCOR) 20 MG tablet Take 20 mg by mouth every evening.    [provider]  traMADol (ULTRAM) 50 MG tablet Take 50-100 mg by mouth every 6 (six) hours as needed for moderate pain. 06/13/19   [provider]  traZODone  (DESYREL) 100 MG tablet Take 50-100 mg by mouth at bedtime.    [provider]    Allergies    Flagyl [metronidazole], Lyrica [pregabalin], Adhesive [tape], Aspirin, Latex, Phenergan [promethazine hcl], Prednisone, Sulfa antibiotics, and Tetanus toxoids  Review of Systems   Review of Systems  Constitutional:  Positive for fatigue and fever. Negative for appetite change.  HENT:  Positive for congestion.   Respiratory:  Positive for cough.   Cardiovascular:  Negative for leg swelling.  Gastrointestinal:  Positive for vomiting.  Genitourinary:  Negative for flank pain.  Musculoskeletal:  Negative for back pain.  Skin:  Negative for rash.  Neurological:  Negative for weakness.  Psychiatric/Behavioral:  Negative for confusion.    Physical Exam Updated Vital Signs BP 109/63   Pulse 64   Temp 98.9 F (37.2 C)   Resp 20   Ht '5\' 1"'$  (1.549 m)   Wt 56.7 kg   SpO2 99%   BMI 23.62 kg/m   Physical Exam Vitals and nursing note reviewed.  Constitutional:      Appearance: Normal appearance.  HENT:     Head: Normocephalic.     Mouth/Throat:     Mouth: Mucous membranes are moist.  Eyes:     Pupils: Pupils are equal, round, and reactive to light.  Cardiovascular:     Rate and Rhythm: Regular rhythm.  Pulmonary:     Breath sounds: No wheezing or rhonchi.  Abdominal:     Tenderness: There is no abdominal tenderness.  Musculoskeletal:        General: No tenderness.  Skin:    General: Skin is warm.     Capillary Refill: Capillary refill takes less than 2 seconds.  Neurological:     Mental Status: She is alert and oriented to person, place, and time.  Psychiatric:        Mood and Affect: Mood normal.    ED Results / Procedures / Treatments   Labs (all labs ordered are listed, but only abnormal results are displayed) Labs Reviewed  RESP PANEL BY RT-PCR (FLU A&B, COVID) ARPGX2 - Abnormal; Notable for the following components:      Result Value   SARS Coronavirus 2 by  RT  PCR POSITIVE (*)    All other components within normal limits  BASIC METABOLIC PANEL - Abnormal; Notable for the following components:   Glucose, Bld 102 (*)    Creatinine, Ser 1.37 (*)    GFR, Estimated 41 (*)    All other components within normal limits    EKG None  Radiology No results found.  Procedures Procedures   Medications Ordered in ED Medications  ondansetron (ZOFRAN-ODT) disintegrating tablet 4 mg (4 mg Oral Given 11/11/20 1107)  acetaminophen (TYLENOL) tablet 650 mg (650 mg Oral Given 11/11/20 1107)    ED Course  I have reviewed the triage vital signs and the nursing notes.  Pertinent labs & imaging results that were available during my care of the patient were reviewed by me and considered in my medical decision making (see chart for details).    MDM Rules/Calculators/A&P                           Patient with COVID-like symptoms.  Had exposure.  Patient is COVID-positive.  Feels better after Tylenol and antiemetic.  COVID test was positive.  Lungs clear.  Discussed with pharmacy and will treat with lower dosed Paxlovid.  Will hold simvastatin while she is on it.  Also will hold Advair.  Appears overall well.  Not hypoxic.  Will discharge home with outpatient follow-up as needed Final Clinical Impression(s) / ED Diagnoses Final diagnoses:  [id number]    Rx / DC Orders ED Discharge Orders          Ordered    nirmatrelvir/ritonavir EUA, renal dosing, (PAXLOVID) 10 x 150 MG & 10 x '100MG'$  TABS  2 times daily        11/11/20 1336             Davonna Belling, MD 11/11/20 1424

## 2020-11-11 NOTE — Discharge Instructions (Addendum)
Stop your simvastatin while you are taking the paxlovid.  Also hold your Advair.

## 2020-11-11 NOTE — ED Triage Notes (Signed)
Pt reports fever that started fever that started yesterday.  Husband at home is covid positive.  Pt reports vomiting x 2 this am.  Mild cough.  Resp even and unlabored.  Skin warm and dry.  No fever medication this am.

## 2020-11-14 ENCOUNTER — Encounter (HOSPITAL_COMMUNITY): Payer: Self-pay | Admitting: Otolaryngology

## 2020-11-14 NOTE — Pre-Procedure Instructions (Signed)
PCP - Dr. Rolland Bimler Pulmonary- Dr. Exie Parody Gastrology  Dr. Tasia Catchings EKG - 08/01/20 Chest x-ray - n/a ECHO - 11/12/19  CPAP - Yes- cannot use d/t mask allergy  Fasting Blood Sugar:  100 Checks Blood Sugar:  1/week  NPO after midnight night before surgery  COVID TEST- n/a (covid + 11/11/20)  Anesthesia review: Yes  -------------  SDW INSTRUCTIONS:  Your procedure is scheduled on 11/25/20. Please report to [facility] Entrance "A" at 07:55 A.M., and check in at the Admitting office. Call this number if you have problems the morning of surgery: [phone]   Remember: Do not eat or drink after midnight the night before your surgery   Medications to take morning of surgery with a sip of water include: dicyclomine (BENTYL) DULoxetine (CYMBALTA) esomeprazole (NEXIUM) famotidine (PEPCID) Fluticasone-Salmeterol (ADVAIR) 250-50 MCG/DOSE AEPB gabapentin (NEURONTIN) levothyroxine (SYNTHROID, LEVOTHROID) simvastatin (ZOCOR  If needed: acetaminophen (TYLENOL)   albuterol (PROVENTIL HFA;VENTOLIN HFA) 108 (90 Base) MCG/ACT inhaler ondansetron (ZOFRAN) traMADol (ULTRAM)  Please bring all inhalers with you the day of surgery.   As of today, STOP taking any Aspirin (unless otherwise instructed by your surgeon), Aleve, Naproxen, Ibuprofen, Motrin, Advil, Goody's, BC's, all herbal medications, fish oil, and all vitamins.  WHAT DO I DO ABOUT MY DIABETES MEDICATION?  Do not take metFORMIN (GLUCOPHAGE-XR) the morning of surgery.   HOW TO MANAGE YOUR DIABETES BEFORE AND AFTER SURGERY  Why is it important to control my blood sugar before and after surgery? Improving blood sugar levels before and after surgery helps healing and can limit problems. A way of improving blood sugar control is eating a healthy diet by:  Eating less sugar and carbohydrates  Increasing activity/exercise  Talking with your doctor about reaching your blood sugar goals High blood sugars (greater than 180 mg/dL) can  raise your risk of infections and slow your recovery, so you will need to focus on controlling your diabetes during the weeks before surgery. Make sure that the doctor who takes care of your diabetes knows about your planned surgery including the date and location.  How do I manage my blood sugar before surgery? Check your blood sugar at least 4 times a day, starting 2 days before surgery, to make sure that the level is not too high or low.  Check your blood sugar the morning of your surgery when you wake up and every 2 hours until you get to the Short Stay unit.  If your blood sugar is less than 70 mg/dL, you will need to treat for low blood sugar: Do not take insulin. Treat a low blood sugar (less than 70 mg/dL) with  cup of clear juice (cranberry or apple), 4 glucose tablets, OR glucose gel. Recheck blood sugar in 15 minutes after treatment (to make sure it is greater than 70 mg/dL). If your blood sugar is not greater than 70 mg/dL on recheck, call [phone] for further instructions. Report your blood sugar to the short stay nurse when you get to Short Stay.  If you are admitted to the hospital after surgery: Your blood sugar will be checked by the staff and you will probably be given insulin after surgery (instead of oral diabetes medicines) to make sure you have good blood sugar levels. The goal for blood sugar control after surgery is 80-180 mg/dL.   The Morning of Surgery Do not wear jewelry, make-up or nail polish. Do not wear lotions, powders, or perfumes/colognes, or deodorant Do not shave 48 hours prior to surgery.  Men may shave face and neck. Do not bring valuables to the hospital. [facility] is not responsible for any belongings or valuables.  If you are a smoker, DO NOT Smoke 24 hours prior to surgery  If you wear a CPAP at night please bring your mask the morning of surgery   Remember that you must have someone to transport you home after your surgery, and remain  with you for 24 hours if you are discharged the same day.  Please bring cases for contacts, glasses, hearing aids, dentures or bridgework because it cannot be worn into surgery.   Patients discharged the day of surgery will not be allowed to drive home.   Please shower the NIGHT BEFORE/MORNING OF SURGERY (use antibacterial soap like DIAL soap if possible). Wear comfortable clothes the morning of surgery. Oral Hygiene is also important to reduce your risk of infection.  Remember - BRUSH YOUR TEETH THE MORNING OF SURGERY WITH YOUR REGULAR TOOTHPASTE  Patient denies shortness of breath, fever, cough and chest pain.

## 2020-11-15 ENCOUNTER — Encounter (HOSPITAL_COMMUNITY): Payer: Self-pay | Admitting: Otolaryngology

## 2020-11-15 ENCOUNTER — Inpatient Hospital Stay (HOSPITAL_COMMUNITY): Admission: RE | Admit: 2020-11-15 | Payer: Medicare Other | Source: Ambulatory Visit

## 2020-11-15 NOTE — Anesthesia Preprocedure Evaluation (Addendum)
Anesthesia Evaluation  Patient identified by MRN, date of birth, ID band Patient awake    Reviewed: Allergy & Precautions, NPO status , Patient's Chart, lab work & pertinent test results  Airway Mallampati: II  TM Distance: >3 FB Neck ROM: Full    Dental no notable dental hx. (+) Poor Dentition, Missing, Chipped, Partial Upper,    Pulmonary sleep apnea , COPD,  COPD inhaler,    Pulmonary exam normal breath sounds clear to auscultation       Cardiovascular hypertension, Pt. on medications Normal cardiovascular exam Rhythm:Regular Rate:Normal     Neuro/Psych    GI/Hepatic GERD  ,  Endo/Other  diabetesHypothyroidism   Renal/GU Renal disease     Musculoskeletal  (+) Fibromyalgia -  Abdominal   Peds  Hematology   Anesthesia Other Findings   Reproductive/Obstetrics                           Anesthesia Physical Anesthesia Plan  ASA: 3  Anesthesia Plan: General   Post-op Pain Management:    Induction: Intravenous  PONV Risk Score and Plan: 4 or greater and Treatment may vary due to age or medical condition, Ondansetron, Dexamethasone and Midazolam  Airway Management Planned: Oral ETT  Additional Equipment: None  Intra-op Plan:   Post-operative Plan: Extubation in OR  Informed Consent: I have reviewed the patients History and Physical, chart, labs and discussed the procedure including the risks, benefits and alternatives for the proposed anesthesia with the patient or authorized representative who has indicated his/her understanding and acceptance.     Dental advisory given  Plan Discussed with: CRNA and Anesthesiologist  Anesthesia Plan Comments: (PAT note written 11/15/2020 by Myra Gianotti, PA-C. + COVID-19 11/11/20. )      Anesthesia Quick Evaluation

## 2020-11-15 NOTE — Progress Notes (Addendum)
Anesthesia Chart Review: SAME DAY WORK-UP  Case: [id number] Date/Time: 11/25/20 0940   Procedure: IMPLANTATION OF HYPOGLOSSAL NERVE STIMULATOR (Right)   Anesthesia type: General   Pre-op diagnosis: obstructive sleep apnea   Location: [facility] OR ROOM 09 / [place] [facility]   Surgeons: Jerrell Belfast, MD       DISCUSSION: Patient is a 75 year old female scheduled for the above procedure. S/p drug induced sleep endoscopy 09/02/20 and anatomically felt to be a candidate for hypoglossal nerve stimulation therapy. ED visit for COVID-19+ 11/11/20.  She was given Zofran and Tylenol and prescribed low-dose Paxlovid.  Surgery scheduled > 10 days out from diagnosis.   History includes never smoker, COPD, HTN, DM2, IBS, fibromyalgia, OSA (unable to tolerate CPAP), hypothyroidism, hypercholesterolemia, tachycardia, essential tremor, dyspnea, anemia, back surgery (L4-5 PLIF ~ 2021).  Will ask staff to follow-up with her closer to surgery date to ensure she is feeling well post COVID for her upcoming surgery. She had a BMET on 11/11/20 during her ED visit. Last CBC noted was in May. She lives in [place], [place].   [place]: Ht '5\' 1"'$  (1.549 m)   Wt 54 kg   BMI 22.48 kg/m  BP Readings from Last 3 Encounters:  11/11/20 109/63  09/27/20 140/66  09/02/20 128/70   Pulse Readings from Last 3 Encounters:  11/11/20 64  09/27/20 73  09/02/20 70     PROVIDERS: London Pepper, MD is PCP  Baird Lyons, MD is pulmonologist. Last visit with Geraldo Pitter, NP on 06/01/20. Referred to ENT since she was intolerant to CPAP. Kathrynn Ducking, MD is neurologist Hildred Laser, MD is GI   LABS: Most recent lab results include: Lab Results  Component Value Date   WBC 7.4 07/30/2020   HGB 10.9 (L) 07/30/2020   HCT 32.0 (L) 07/30/2020   PLT 315 07/30/2020   GLUCOSE 102 (H) 11/11/2020   ALT 22 07/30/2020   AST 26 07/30/2020   NA 136 11/11/2020   K 4.1 11/11/2020   CL 104 11/11/2020   CREATININE 1.37 (H) 11/11/2020   BUN 13  11/11/2020   CO2 24 11/11/2020  Cr range 1.10-1.67 since 07/30/20.    OTHER: PFTs 10/22/19: "FVC 2.34 (94% predicted), postbronchodilator ratio 88, postbronchodilator FEV1 2.09 (111% predicted), no bronchodilator response, DLCO 11.62 (68% predicted)"  Six Min Walk Test 10/22/19: "pt walked at slow pace was able to main oxygen saturation levels all three laps"    IMAGES: CT Abd/pelvis 07/30/20: IMPRESSION: 1. No acute findings. 2. Left nephrolithiasis without hydronephrosis. 3. Descending and sigmoid diverticulosis. 4. Aortoiliac  atherosclerosis (ICD10-170.0).  CT L-spine 03/31/20: IMPRESSION: 1. Prior L4-L5 PLIF with significant subsidence of the interbody graft and grade 1 anterolisthesis at L4-L5, unchanged since August 2021, but new since the surgery in October 2020. No dynamic instability. 2. Mild degenerative disc disease at the remaining lumbar levels without high-grade stenosis or impingement. 3. Nonobstructive left nephrolithiasis. 4. Aortic Atherosclerosis (ICD10-I70.0).    EKG: 07/30/20:  Sinus rhythm Supraventricular bigeminy Low voltage, precordial leads Confirmed by Pattricia Boss [phone]) on 07/31/2020 3:21:40 PM   CV: Echo 11/12/19: IMPRESSIONS   1. Left ventricular ejection fraction, by estimation, is 55 to 60%. The  left ventricle has normal function. The left ventricle has no regional  wall motion abnormalities. There is mild left ventricular hypertrophy.  Left ventricular diastolic parameters  are consistent with Grade I diastolic dysfunction (impaired relaxation).   2. Right ventricular systolic function is normal. The right ventricular  size is normal.   3.  The mitral valve is normal in structure. No evidence of mitral valve  regurgitation. No evidence of mitral stenosis.   4. The aortic valve is tricuspid. Aortic valve regurgitation is not  visualized. No aortic stenosis is present.   5. IVC is small, suggesting low RA pressure and hypovolemia.    Normal coronaries by 05/28/00 [facility].   Past Medical History:  Diagnosis Date   Anemia    Arthritis    COPD (chronic obstructive pulmonary disease) (HCC)    Diabetes ([place])    Diverticulitis    Diverticulosis    Dyspnea    Fibromyalgia    High cholesterol    History of kidney stones    Hypertension    Hypothyroid    IBS (irritable bowel syndrome)    Osteopenia    Pneumonia    Restless leg    Sleep apnea    Doesn't use CPAP.     Tachycardia    per pt/fim   Tremor, essential 01/07/2018    Past Surgical History:  Procedure Laterality Date   ABDOMINAL HYSTERECTOMY     APPENDECTOMY     BACK SURGERY     BIOPSY  12/25/2016   Procedure: BIOPSY;  Surgeon: Rogene Houston, MD;  [facility];  Service: Endoscopy;;  gastric    BREAST LUMPECTOMY Right 03/20/1999   CATARACT EXTRACTION Bilateral 03/19/2014   CHOLECYSTECTOMY     COLONOSCOPY N/A 12/17/2012   Procedure: COLONOSCOPY;  Surgeon: Rogene Houston, MD;  [facility];  Service: Endoscopy;  Laterality: N/A;  215   COLONOSCOPY WITH PROPOFOL N/A 07/24/2019   Procedure: COLONOSCOPY WITH PROPOFOL;  Surgeon: Rogene Houston, MD;  [facility];  Service: Endoscopy;  Laterality: N/A;  730   complete hysterectomy     DRUG INDUCED ENDOSCOPY N/A 09/02/2020   Procedure: DRUG INDUCED ENDOSCOPY;  Surgeon: Jerrell Belfast, MD;  Location: [place];  Service: ENT;  Laterality: N/A;   ESOPHAGEAL DILATION N/A 12/25/2016   Procedure: ESOPHAGEAL DILATION;  Surgeon: Rogene Houston, MD;  [facility];  Service: Endoscopy;  Laterality: N/A;   ESOPHAGEAL DILATION N/A 07/24/2019   Procedure: ESOPHAGEAL DILATION;  Surgeon: Rogene Houston, MD;  [facility];  Service: Endoscopy;  Laterality: N/A;   ESOPHAGOGASTRODUODENOSCOPY N/A 12/25/2016   Procedure: ESOPHAGOGASTRODUODENOSCOPY (EGD);  Surgeon: Rogene Houston, MD;  [facility];  Service: Endoscopy;   Laterality: N/A;  730   ESOPHAGOGASTRODUODENOSCOPY (EGD) WITH PROPOFOL N/A 07/24/2019   Procedure: ESOPHAGOGASTRODUODENOSCOPY (EGD) WITH PROPOFOL;  Surgeon: Rogene Houston, MD;  [facility];  Service: Endoscopy;  Laterality: N/A;   Heel tumor removed     POLYPECTOMY  07/24/2019   Procedure: POLYPECTOMY;  Surgeon: Rogene Houston, MD;  [facility];  Service: Endoscopy;;   rt elbow surgery     TONSILLECTOMY      MEDICATIONS: No current facility-administered medications for this encounter.    acetaminophen (TYLENOL) 500 MG tablet   albuterol (PROVENTIL HFA;VENTOLIN HFA) 108 (90 Base) MCG/ACT inhaler   Cyanocobalamin (B-12) 2500 MCG TABS   dicyclomine (BENTYL) 10 MG capsule   docusate sodium (COLACE) 100 MG capsule   DULoxetine (CYMBALTA) 30 MG capsule   esomeprazole (NEXIUM) 40 MG capsule   famotidine (PEPCID) 40 MG tablet   Fluticasone-Salmeterol (ADVAIR) 250-50 MCG/DOSE AEPB   gabapentin (NEURONTIN) 100 MG capsule   levothyroxine (SYNTHROID, LEVOTHROID) 75 MCG tablet   lisinopril (PRINIVIL,ZESTRIL) 10 MG tablet  metFORMIN (GLUCOPHAGE-XR) 500 MG 24 hr tablet   neomycin-bacitracin-polymyxin (NEOSPORIN) ointment   ondansetron (ZOFRAN) 4 MG tablet   simvastatin (ZOCOR) 20 MG tablet   traMADol (ULTRAM) 50 MG tablet   traZODone (DESYREL) 100 MG tablet   nirmatrelvir/ritonavir EUA, renal dosing, (PAXLOVID) 10 x 150 MG & 10 x '100MG'$  TABS   polyethylene glycol powder (GLYCOLAX/MIRALAX) 17 GM/SCOOP powder    Myra Gianotti, PA-C Surgical Short Stay/Anesthesiology [facility] Phone [phone] [facility] Phone [phone] 11/15/2020 6:02 PM

## 2020-11-25 ENCOUNTER — Ambulatory Visit (HOSPITAL_COMMUNITY): Payer: Medicare Other

## 2020-11-25 ENCOUNTER — Encounter (HOSPITAL_COMMUNITY)
Admission: RE | Disposition: A | Discharge status: Home / Self Care | Payer: Self-pay | Source: Home / Self Care | Attending: Otolaryngology

## 2020-11-25 ENCOUNTER — Ambulatory Visit (HOSPITAL_COMMUNITY): Payer: Medicare Other | Admitting: Vascular Surgery

## 2020-11-25 ENCOUNTER — Encounter (HOSPITAL_COMMUNITY): Payer: Self-pay | Admitting: Otolaryngology

## 2020-11-25 ENCOUNTER — Other Ambulatory Visit: Payer: Self-pay

## 2020-11-25 ENCOUNTER — Ambulatory Visit (HOSPITAL_COMMUNITY)
Admission: RE | Admit: 2020-11-25 | Discharge: 2020-11-25 | Disposition: A | Discharge status: Home / Self Care | Payer: Medicare Other | Attending: Otolaryngology | Admitting: Otolaryngology

## 2020-11-25 DIAGNOSIS — K589 Irritable bowel syndrome without diarrhea: Secondary | ICD-10-CM | POA: Insufficient documentation

## 2020-11-25 DIAGNOSIS — Z789 Other specified health status: Secondary | ICD-10-CM | POA: Insufficient documentation

## 2020-11-25 DIAGNOSIS — M797 Fibromyalgia: Secondary | ICD-10-CM | POA: Diagnosis not present

## 2020-11-25 DIAGNOSIS — E119 Type 2 diabetes mellitus without complications: Secondary | ICD-10-CM | POA: Diagnosis not present

## 2020-11-25 DIAGNOSIS — Z79890 Hormone replacement therapy (postmenopausal): Secondary | ICD-10-CM | POA: Insufficient documentation

## 2020-11-25 DIAGNOSIS — Z91040 Latex allergy status: Secondary | ICD-10-CM | POA: Insufficient documentation

## 2020-11-25 DIAGNOSIS — J988 Other specified respiratory disorders: Secondary | ICD-10-CM | POA: Insufficient documentation

## 2020-11-25 DIAGNOSIS — G4733 Obstructive sleep apnea (adult) (pediatric): Secondary | ICD-10-CM | POA: Diagnosis present

## 2020-11-25 DIAGNOSIS — E7800 Pure hypercholesterolemia, unspecified: Secondary | ICD-10-CM | POA: Diagnosis not present

## 2020-11-25 DIAGNOSIS — Z8049 Family history of malignant neoplasm of other genital organs: Secondary | ICD-10-CM | POA: Diagnosis not present

## 2020-11-25 DIAGNOSIS — Z888 Allergy status to other drugs, medicaments and biological substances status: Secondary | ICD-10-CM | POA: Insufficient documentation

## 2020-11-25 DIAGNOSIS — Z800 Family history of malignant neoplasm of digestive organs: Secondary | ICD-10-CM | POA: Insufficient documentation

## 2020-11-25 DIAGNOSIS — I10 Essential (primary) hypertension: Secondary | ICD-10-CM | POA: Diagnosis not present

## 2020-11-25 DIAGNOSIS — Z825 Family history of asthma and other chronic lower respiratory diseases: Secondary | ICD-10-CM | POA: Diagnosis not present

## 2020-11-25 DIAGNOSIS — J449 Chronic obstructive pulmonary disease, unspecified: Secondary | ICD-10-CM | POA: Diagnosis not present

## 2020-11-25 DIAGNOSIS — Z833 Family history of diabetes mellitus: Secondary | ICD-10-CM | POA: Insufficient documentation

## 2020-11-25 DIAGNOSIS — Z887 Allergy status to serum and vaccine status: Secondary | ICD-10-CM | POA: Diagnosis not present

## 2020-11-25 DIAGNOSIS — Z882 Allergy status to sulfonamides status: Secondary | ICD-10-CM | POA: Diagnosis not present

## 2020-11-25 DIAGNOSIS — Z7984 Long term (current) use of oral hypoglycemic drugs: Secondary | ICD-10-CM | POA: Insufficient documentation

## 2020-11-25 DIAGNOSIS — Z886 Allergy status to analgesic agent status: Secondary | ICD-10-CM | POA: Insufficient documentation

## 2020-11-25 DIAGNOSIS — Z8616 Personal history of COVID-19: Secondary | ICD-10-CM | POA: Insufficient documentation

## 2020-11-25 DIAGNOSIS — Z823 Family history of stroke: Secondary | ICD-10-CM | POA: Insufficient documentation

## 2020-11-25 DIAGNOSIS — Z801 Family history of malignant neoplasm of trachea, bronchus and lung: Secondary | ICD-10-CM | POA: Insufficient documentation

## 2020-11-25 DIAGNOSIS — Z820 Family history of epilepsy and other diseases of the nervous system: Secondary | ICD-10-CM | POA: Insufficient documentation

## 2020-11-25 DIAGNOSIS — Z09 Encounter for follow-up examination after completed treatment for conditions other than malignant neoplasm: Secondary | ICD-10-CM

## 2020-11-25 DIAGNOSIS — Z79899 Other long term (current) drug therapy: Secondary | ICD-10-CM | POA: Insufficient documentation

## 2020-11-25 DIAGNOSIS — Z7951 Long term (current) use of inhaled steroids: Secondary | ICD-10-CM | POA: Insufficient documentation

## 2020-11-25 DIAGNOSIS — G250 Essential tremor: Secondary | ICD-10-CM | POA: Insufficient documentation

## 2020-11-25 DIAGNOSIS — E039 Hypothyroidism, unspecified: Secondary | ICD-10-CM | POA: Diagnosis not present

## 2020-11-25 DIAGNOSIS — Z91048 Other nonmedicinal substance allergy status: Secondary | ICD-10-CM | POA: Insufficient documentation

## 2020-11-25 HISTORY — PX: IMPLANTATION OF HYPOGLOSSAL NERVE STIMULATOR: SHX6827

## 2020-11-25 HISTORY — DX: Anemia, unspecified: D64.9

## 2020-11-25 LAB — CBC
HCT: 33.7 % — ABNORMAL LOW (ref 36.0–46.0)
Hemoglobin: 10.9 g/dL — ABNORMAL LOW (ref 12.0–15.0)
MCH: 32 pg (ref 26.0–34.0)
MCHC: 32.3 g/dL (ref 30.0–36.0)
MCV: 98.8 fL (ref 80.0–100.0)
Platelets: 344 10*3/uL (ref 150–400)
RBC: 3.41 MIL/uL — ABNORMAL LOW (ref 3.87–5.11)
RDW: 12.2 % (ref 11.5–15.5)
WBC: 6.5 10*3/uL (ref 4.0–10.5)
nRBC: 0 % (ref 0.0–0.2)

## 2020-11-25 LAB — GLUCOSE, CAPILLARY
Glucose-Capillary: 89 mg/dL (ref 70–99)
Glucose-Capillary: 101 mg/dL — ABNORMAL HIGH (ref 70–99)

## 2020-11-25 SURGERY — INSERTION, HYPOGLOSSAL NERVE STIMULATOR
Anesthesia: General | Site: Neck | Laterality: Right

## 2020-11-25 MED ORDER — PROPOFOL 10 MG/ML IV BOLUS
INTRAVENOUS | Status: AC
  Filled 2020-11-25: qty 20

## 2020-11-25 MED ORDER — LIDOCAINE-EPINEPHRINE (PF) 1 %-1:200000 IJ SOLN
INTRAMUSCULAR | Status: DC | PRN
  Administered 2020-11-25: 6 mL

## 2020-11-25 MED ORDER — EPHEDRINE SULFATE 50 MG/ML IJ SOLN
INTRAMUSCULAR | Status: DC | PRN
  Administered 2020-11-25: 5 mg via INTRAVENOUS
  Administered 2020-11-25: 10 mg via INTRAVENOUS

## 2020-11-25 MED ORDER — FENTANYL CITRATE (PF) 250 MCG/5ML IJ SOLN
INTRAMUSCULAR | Status: AC
  Filled 2020-11-25: qty 5

## 2020-11-25 MED ORDER — FENTANYL CITRATE (PF) 100 MCG/2ML IJ SOLN
INTRAMUSCULAR | Status: AC
  Filled 2020-11-25: qty 2

## 2020-11-25 MED ORDER — DEXMEDETOMIDINE (PRECEDEX) IN NS 20 MCG/5ML (4 MCG/ML) IV SYRINGE
PREFILLED_SYRINGE | INTRAVENOUS | Status: AC
  Filled 2020-11-25: qty 5

## 2020-11-25 MED ORDER — HYDROCODONE-ACETAMINOPHEN 5-325 MG PO TABS: ORAL_TABLET | ORAL | 0 refills | Status: DC

## 2020-11-25 MED ORDER — EPHEDRINE 5 MG/ML INJ
INTRAVENOUS | Status: AC
  Filled 2020-11-25: qty 5

## 2020-11-25 MED ORDER — DEXMEDETOMIDINE (PRECEDEX) IN NS 20 MCG/5ML (4 MCG/ML) IV SYRINGE
PREFILLED_SYRINGE | INTRAVENOUS | Status: DC | PRN
  Administered 2020-11-25: 8 ug via INTRAVENOUS

## 2020-11-25 MED ORDER — ORAL CARE MOUTH RINSE: 15.0000 mL | Freq: Once | OROMUCOSAL | Status: AC

## 2020-11-25 MED ORDER — ONDANSETRON HCL 4 MG/2ML IJ SOLN
INTRAMUSCULAR | Status: DC | PRN
  Administered 2020-11-25: 4 mg via INTRAVENOUS

## 2020-11-25 MED ORDER — MIDAZOLAM HCL 2 MG/2ML IJ SOLN
INTRAMUSCULAR | Status: AC
  Filled 2020-11-25: qty 2

## 2020-11-25 MED ORDER — LIDOCAINE HCL (CARDIAC) PF 100 MG/5ML IV SOSY
PREFILLED_SYRINGE | INTRAVENOUS | Status: DC | PRN
  Administered 2020-11-25: 100 mg via INTRAVENOUS

## 2020-11-25 MED ORDER — ACETAMINOPHEN 10 MG/ML IV SOLN: 1000.0000 mg | Freq: Once | INTRAVENOUS | Status: DC | PRN

## 2020-11-25 MED ORDER — FENTANYL CITRATE (PF) 100 MCG/2ML IJ SOLN
25.0000 ug | INTRAMUSCULAR | Status: DC | PRN
  Administered 2020-11-25: 50 ug via INTRAVENOUS

## 2020-11-25 MED ORDER — FENTANYL CITRATE (PF) 100 MCG/2ML IJ SOLN
INTRAMUSCULAR | Status: DC | PRN
  Administered 2020-11-25 (×5): 50 ug via INTRAVENOUS

## 2020-11-25 MED ORDER — CHLORHEXIDINE GLUCONATE 0.12 % MT SOLN
15.0000 mL | Freq: Once | OROMUCOSAL | Status: AC
  Administered 2020-11-25: 15 mL via OROMUCOSAL
  Filled 2020-11-25: qty 15

## 2020-11-25 MED ORDER — 0.9 % SODIUM CHLORIDE (POUR BTL) OPTIME
TOPICAL | Status: DC | PRN
  Administered 2020-11-25: 1000 mL

## 2020-11-25 MED ORDER — CEFAZOLIN SODIUM-DEXTROSE 2-4 GM/100ML-% IV SOLN
2.0000 g | INTRAVENOUS | Status: AC
Start: 2020-11-25 — End: 2020-11-25
  Administered 2020-11-25: 2 g via INTRAVENOUS
  Filled 2020-11-25: qty 100

## 2020-11-25 MED ORDER — PHENYLEPHRINE HCL (PRESSORS) 10 MG/ML IV SOLN
INTRAVENOUS | Status: DC | PRN
  Administered 2020-11-25 (×4): 100 ug via INTRAVENOUS

## 2020-11-25 MED ORDER — ONDANSETRON HCL 4 MG/2ML IJ SOLN
INTRAMUSCULAR | Status: AC
  Filled 2020-11-25: qty 2

## 2020-11-25 MED ORDER — PROPOFOL 10 MG/ML IV BOLUS
INTRAVENOUS | Status: DC | PRN
  Administered 2020-11-25: 20 mg via INTRAVENOUS
  Administered 2020-11-25: 110 mg via INTRAVENOUS

## 2020-11-25 MED ORDER — SUCCINYLCHOLINE CHLORIDE 200 MG/10ML IV SOSY
PREFILLED_SYRINGE | INTRAVENOUS | Status: DC | PRN
  Administered 2020-11-25: 80 mg via INTRAVENOUS

## 2020-11-25 MED ORDER — LIDOCAINE-EPINEPHRINE (PF) 1 %-1:200000 IJ SOLN
INTRAMUSCULAR | Status: AC
  Filled 2020-11-25: qty 30

## 2020-11-25 MED ORDER — CEPHALEXIN 500 MG PO CAPS: 500.0000 mg | ORAL_CAPSULE | Freq: Three times a day (TID) | ORAL | 0 refills | Status: AC

## 2020-11-25 MED ORDER — LACTATED RINGERS IV SOLN: INTRAVENOUS | Status: DC

## 2020-11-25 SURGICAL SUPPLY — 64 items
BAG COUNTER SPONGE SURGICOUNT (BAG) ×2 IMPLANT
BAG DECANTER FOR FLEXI CONT (MISCELLANEOUS) ×2 IMPLANT
BLADE CLIPPER SURG (BLADE) IMPLANT
BLADE SURG 15 STRL LF DISP TIS (BLADE) ×3 IMPLANT
BLADE SURG 15 STRL SS (BLADE) ×6
CANISTER SUCT 3000ML PPV (MISCELLANEOUS) ×2 IMPLANT
CORD BIPOLAR FORCEPS 12FT (ELECTRODE) ×2 IMPLANT
COVER PROBE W GEL 5X96 (DRAPES) ×2 IMPLANT
COVER SURGICAL LIGHT HANDLE (MISCELLANEOUS) ×2 IMPLANT
DERMABOND ADVANCED (GAUZE/BANDAGES/DRESSINGS) ×2
DERMABOND ADVANCED .7 DNX12 (GAUZE/BANDAGES/DRESSINGS) ×2 IMPLANT
DRAPE C-ARM 35X43 STRL (DRAPES) ×2 IMPLANT
DRAPE HEAD BAR (DRAPES) ×2 IMPLANT
DRAPE INCISE IOBAN 66X45 STRL (DRAPES) ×2 IMPLANT
DRAPE MICROSCOPE LEICA 54X105 (DRAPES) ×2 IMPLANT
DRAPE UTILITY XL STRL (DRAPES) ×2 IMPLANT
DRSG TEGADERM 2-3/8X2-3/4 SM (GAUZE/BANDAGES/DRESSINGS) ×4 IMPLANT
ELECT COATED BLADE 2.86 ST (ELECTRODE) ×2 IMPLANT
ELECT EMG 18 NIMS (NEUROSURGERY SUPPLIES) ×2
ELECTRODE EMG 18 NIMS (NEUROSURGERY SUPPLIES) ×1 IMPLANT
FORCEPS BIPOLAR SPETZLER 8 1.0 (NEUROSURGERY SUPPLIES) ×2 IMPLANT
GAUZE 4X4 16PLY ~~LOC~~+RFID DBL (SPONGE) ×4 IMPLANT
GAUZE SPONGE 4X4 12PLY STRL (GAUZE/BANDAGES/DRESSINGS) ×2 IMPLANT
GENERATOR PULSE INSPIRE (Generator) ×2 IMPLANT
GLOVE SURG ENC MOIS LTX SZ6.5 (GLOVE) IMPLANT
GLOVE SURG ENC TEXT LTX SZ7 (GLOVE) ×2 IMPLANT
GOWN STRL REUS W/ TWL LRG LVL3 (GOWN DISPOSABLE) ×2 IMPLANT
GOWN STRL REUS W/TWL LRG LVL3 (GOWN DISPOSABLE) ×4
KIT BASIN OR (CUSTOM PROCEDURE TRAY) ×2 IMPLANT
KIT NEURO ACCESSORY W/WRENCH (MISCELLANEOUS) IMPLANT
KIT TURNOVER KIT B (KITS) ×2 IMPLANT
LEAD SENSING RESP INSPIRE (Lead) ×2 IMPLANT
LEAD SLEEP STIMULATION INSPIRE (Lead) ×2 IMPLANT
LOOP VESSEL MAXI BLUE (MISCELLANEOUS) ×2 IMPLANT
LOOP VESSEL MINI RED (MISCELLANEOUS) ×2 IMPLANT
MARKER SKIN DUAL TIP RULER LAB (MISCELLANEOUS) ×4 IMPLANT
NEEDLE HYPO 25GX1X1/2 BEV (NEEDLE) ×2 IMPLANT
NS IRRIG 1000ML POUR BTL (IV SOLUTION) ×2 IMPLANT
PAD ARMBOARD 7.5X6 YLW CONV (MISCELLANEOUS) ×2 IMPLANT
PASSER CATH 38CM DISP (INSTRUMENTS) ×2 IMPLANT
PENCIL SMOKE EVACUATOR (MISCELLANEOUS) ×2 IMPLANT
POSITIONER HEAD DONUT 9IN (MISCELLANEOUS) ×2 IMPLANT
PROBE NERVE STIMULATOR (NEUROSURGERY SUPPLIES) ×2 IMPLANT
REMOTE CONTROL SLEEP INSPIRE (MISCELLANEOUS) ×2 IMPLANT
SET WALTER ACTIVATION W/DRAPE (SET/KITS/TRAYS/PACK) ×2 IMPLANT
SLING ARM FOAM STRAP LRG (SOFTGOODS) IMPLANT
SLING ARM FOAM STRAP MED (SOFTGOODS) IMPLANT
SPONGE INTESTINAL PEANUT (DISPOSABLE) ×2 IMPLANT
STAPLER VISISTAT 35W (STAPLE) ×2 IMPLANT
STRIP CLOSURE SKIN 1/2X4 (GAUZE/BANDAGES/DRESSINGS) ×2 IMPLANT
SUT SILK 2 0 SH (SUTURE) ×4 IMPLANT
SUT SILK 3 0 RB1 (SUTURE) IMPLANT
SUT SILK 3 0 REEL (SUTURE) ×2 IMPLANT
SUT SILK 3 0 SH 30 (SUTURE) IMPLANT
SUT SILK 3-0 (SUTURE) ×4
SUT SILK 3-0 RB1 30XBRD (SUTURE) ×2
SUT VIC AB 4-0 PS2 27 (SUTURE) ×4 IMPLANT
SUT VIC AB 5-0 P-3 18XBRD (SUTURE) ×2 IMPLANT
SUT VIC AB 5-0 P3 18 (SUTURE) ×4
SUTURE SILK 3-0 RB1 30XBRD (SUTURE) ×2 IMPLANT
SYR 10ML LL (SYRINGE) ×2 IMPLANT
TAPE CLOTH SURG 4X10 WHT LF (GAUZE/BANDAGES/DRESSINGS) IMPLANT
TOWEL GREEN STERILE (TOWEL DISPOSABLE) ×2 IMPLANT
TRAY ENT MC OR (CUSTOM PROCEDURE TRAY) ×2 IMPLANT

## 2020-11-25 NOTE — H&P (Signed)
Heather Espinoza is an 75 y.o. female.   Chief Complaint: Obstructive sleep apnea HPI: Patient with a history of moderately severe obstructive sleep apnea.  She was trialed on CPAP therapy but was unable to tolerate the device for long-term management of her obstructive sleep apnea.  Past Medical History:  Diagnosis Date   Anemia    Arthritis    COPD (chronic obstructive pulmonary disease) ([place])    COVID-19 11/11/2020   Diabetes ([place])    Diverticulitis    Diverticulosis    Dyspnea    Fibromyalgia    High cholesterol    History of kidney stones    Hypertension    Hypothyroid    IBS (irritable bowel syndrome)    Osteopenia    Pneumonia    Restless leg    Sleep apnea    Doesn't use CPAP.     Tachycardia    per pt/fim   Tremor, essential 01/07/2018    Past Surgical History:  Procedure Laterality Date   ABDOMINAL HYSTERECTOMY     APPENDECTOMY     BACK SURGERY     BIOPSY  12/25/2016   Procedure: BIOPSY;  Surgeon: Rogene Houston, MD;  [facility];  Service: Endoscopy;;  gastric    BREAST LUMPECTOMY Right 03/20/1999   CATARACT EXTRACTION Bilateral 03/19/2014   CHOLECYSTECTOMY     COLONOSCOPY N/A 12/17/2012   Procedure: COLONOSCOPY;  Surgeon: Rogene Houston, MD;  [facility];  Service: Endoscopy;  Laterality: N/A;  215   COLONOSCOPY WITH PROPOFOL N/A 07/24/2019   Procedure: COLONOSCOPY WITH PROPOFOL;  Surgeon: Rogene Houston, MD;  [facility];  Service: Endoscopy;  Laterality: N/A;  730   complete hysterectomy     DRUG INDUCED ENDOSCOPY N/A 09/02/2020   Procedure: DRUG INDUCED ENDOSCOPY;  Surgeon: Jerrell Belfast, MD;  Location: [place];  Service: ENT;  Laterality: N/A;   ESOPHAGEAL DILATION N/A 12/25/2016   Procedure: ESOPHAGEAL DILATION;  Surgeon: Rogene Houston, MD;  [facility];  Service: Endoscopy;  Laterality: N/A;   ESOPHAGEAL DILATION N/A 07/24/2019   Procedure: ESOPHAGEAL DILATION;  Surgeon:  Rogene Houston, MD;  [facility];  Service: Endoscopy;  Laterality: N/A;   ESOPHAGOGASTRODUODENOSCOPY N/A 12/25/2016   Procedure: ESOPHAGOGASTRODUODENOSCOPY (EGD);  Surgeon: Rogene Houston, MD;  [facility];  Service: Endoscopy;  Laterality: N/A;  730   ESOPHAGOGASTRODUODENOSCOPY (EGD) WITH PROPOFOL N/A 07/24/2019   Procedure: ESOPHAGOGASTRODUODENOSCOPY (EGD) WITH PROPOFOL;  Surgeon: Rogene Houston, MD;  [facility];  Service: Endoscopy;  Laterality: N/A;   Heel tumor removed     POLYPECTOMY  07/24/2019   Procedure: POLYPECTOMY;  Surgeon: Rogene Houston, MD;  [facility];  Service: Endoscopy;;   rt elbow surgery     TONSILLECTOMY      Family History  Problem Relation Age of Onset   Uterine cancer Mother    Parkinson's disease Father    Stroke Father    Colon cancer Brother    Lung cancer Brother    COPD Sister    Diabetes Grandchild    Asthma Son    Social History:  reports that she has never smoked. She has never used smokeless tobacco. She reports that she does not drink alcohol and does not use drugs.  Allergies:  Allergies  Allergen Reactions   Flagyl [Metronidazole] Shortness Of Breath and Swelling   Lyrica [Pregabalin] Shortness Of Breath   Adhesive [Tape]  Other (See Comments)    Takes off patient's skin   Aspirin Other (See Comments)    Stomach bleed   Latex Itching and Rash   Phenergan [Promethazine Hcl] Other (See Comments)    Caused grogginess, altered mental status   Prednisone Anxiety   Sulfa Antibiotics Nausea Only   Tetanus Toxoids Swelling    Arm Area    Medications Prior to Admission  Medication Sig Dispense Refill   acetaminophen (TYLENOL) 500 MG tablet Take 1,000 mg by mouth every 6 (six) hours as needed for moderate pain.     albuterol (PROVENTIL HFA;VENTOLIN HFA) 108 (90 Base) MCG/ACT inhaler Inhale 2 puffs into the lungs every 6 (six) hours as needed for wheezing or shortness of breath.      Cyanocobalamin (B-12) 2500 MCG TABS Take 2,500 mcg by mouth daily.     dicyclomine (BENTYL) 10 MG capsule Take 1 capsule (10 mg total) by mouth 2 (two) times daily before a meal. 180 capsule 1   docusate sodium (COLACE) 100 MG capsule Take 2 capsules (200 mg total) by mouth at bedtime. (Patient taking differently: Take 100 mg by mouth daily as needed for moderate constipation.) 10 capsule 0   DULoxetine (CYMBALTA) 30 MG capsule Take 30 mg by mouth daily.     esomeprazole (NEXIUM) 40 MG capsule Take 1 capsule (40 mg total) by mouth daily before breakfast. 90 capsule 1   famotidine (PEPCID) 40 MG tablet Take 1 tablet (40 mg total) by mouth at bedtime. 90 tablet 1   Fluticasone-Salmeterol (ADVAIR) 250-50 MCG/DOSE AEPB Inhale 1 puff into the lungs every 12 (twelve) hours.     gabapentin (NEURONTIN) 100 MG capsule Take 100 mg by mouth 2 (two) times daily.     levothyroxine (SYNTHROID, LEVOTHROID) 75 MCG tablet Take 37.5 mcg by mouth daily before breakfast.      lisinopril (PRINIVIL,ZESTRIL) 10 MG tablet Take 10 mg by mouth in the morning.     metFORMIN (GLUCOPHAGE-XR) 500 MG 24 hr tablet Take 500 mg by mouth daily with supper.     neomycin-bacitracin-polymyxin (NEOSPORIN) ointment Apply 1 application topically as needed for wound care.     ondansetron (ZOFRAN) 4 MG tablet TAKE 1 TABLET BY MOUTH TWICE DAILY AS NEEDED FOR NAUSEA OR VOMITING 30 tablet 1   simvastatin (ZOCOR) 20 MG tablet Take 20 mg by mouth every evening.     traMADol (ULTRAM) 50 MG tablet Take 50-100 mg by mouth every 6 (six) hours as needed for moderate pain.     traZODone (DESYREL) 100 MG tablet Take 50-100 mg by mouth at bedtime.     polyethylene glycol powder (GLYCOLAX/MIRALAX) 17 GM/SCOOP powder Take 8.5 g by mouth daily. (Patient not taking: No sig reported) 255 g 5    Results for orders placed or performed during the hospital encounter of 11/25/20 (from the past 48 hour(s))  Glucose, capillary     Status: None   Collection Time:  11/25/20  8:21 AM  Result Value Ref Range   Glucose-Capillary 89 70 - 99 mg/dL    Comment: Glucose reference range applies only to samples taken after fasting for at least 8 hours.   No results found.  Review of Systems  Respiratory:  Positive for apnea.    Blood pressure (!) 113/58, pulse 73, temperature 97.9 F (36.6 C), temperature source Oral, resp. rate 20, height '5\' 1"'$  (1.549 m), weight 54.4 kg, SpO2 98 %. Physical Exam Constitutional:      Appearance: She is normal weight.  Cardiovascular:     Rate and Rhythm: Normal rate.     Pulses: Normal pulses.  Pulmonary:     Effort: Pulmonary effort is normal.  Musculoskeletal:     Cervical back: Normal range of motion.  Neurological:     Mental Status: She is alert.     Assessment/Plan Patient admitted for hypoglossal nerve stimulator implant (Inspire Implant) under general anesthesia as an outpatient at [facility].  Jerrell Belfast, MD 11/25/2020, 8:49 AM

## 2020-11-25 NOTE — Op Note (Signed)
Operative Note: INSPIRE IMPLANT  Patient: Heather Espinoza  Medical record number: [id number]  Date:11/25/2020  Pre-operative Indications: Moderate/severe obstructive sleep apnea with positive airway pressure intolerance  Postoperative Indications: Same  Surgical Procedure:  1.  12th cranial nerve (hypoglossal) stimulation implant    2.  Placement of chest wall respiratory sensor    3.  Electronic analysis of implanted neurostimulator with pulse generator system  Anesthesia: GET  Surgeon: Delsa Bern, M.D.  Assist: Jolene Provost, PA  Ms. Nordbladh's assistance was required throughout the surgical procedure including surgical planning, retraction, management of bleeding and surgical decision-making throughout the operation.  Complications: None  EBL: 100 cc   Brief History: The patient is a 75 y.o. female with a history of moderate/severe obstructive sleep apnea.  The patient has undergone work-up including sleep study and trial of CPAP which they did not tolerate.  Patient is unable to use long-term CPAP for control of obstructive sleep apnea.  Patient underwent DISE which showed anterior to posterior airway obstruction, considered a good candidate for Inspire Implant. Given the patient's history and findings, I recommended Inspire Implant under general anesthesia, risks and benefits were discussed in detail with the patient and family. They understand and agree with our plan for surgery which is scheduled at [facility] in OR on an elective basis.  Surgical Procedure: The patient is brought to the operating room on 11/25/2020 and placed in supine position on the operating table. General endotracheal anesthesia was established without difficulty. When the patient was adequately anesthetized, surgical timeout was performed and correct identification of the patient and the surgical procedure. The patient was injected with 6 cc of 1% lidocaine 1:100,000 dilution epinephrine in  subcutaneous fashion in the proposed skin incisions.  The patient was positioned and prepped and draped in sterile fashion.  The NIMS monitoring system was positioned and electrodes were placed in the anterior floor of mouth and tongue to assess the genioglossus and styloglossus muscle groups.  Nerve monitoring was used throughout the surgical procedure.  The procedure was begun by creating a modified right submandibular incision in the upper anterior lateral neck ~2 cm below the mandible and in a natural skin crease.  The incision was carried through the skin and underlying subcutaneous tissue to the level of the platysma.  Platysma muscle was divided and subplatysmal flaps were elevated superiorly and inferiorly.  The submandibular space was entered and the submandibular gland was identified and retracted posteriorly.  The digastric tendon was identified and dissection was carried out anterior and posterior along the tendon and muscle bellies.  The mylohyoid muscle was identified and retracted anteriorly and the hypoglossal nerve was carefully dissected across the anterior floor of the submandibular space.  Lateral branches to the retrusor muscles were identified and tested intraoperatively using the NIMS stimulator.  Stimulation electrode cuff for the hypoglossal nerve stimulator was placed distal to these branches on the medial hypoglossal nerve branch innervating the genioglossus muscle.  Diagnostic evaluation confirmed activation of the genioglossus muscle, resulting in genioglossal activation and tongue protrusion which was visually confirmed in the operating room.  The stimulation electrode was  sutured to the digastric tendon with interrupted 4-0 silk sutures.  A second second incision was created in the anterior upper right chest wall overlying the second rib interspace.  Incision was carried through the skin and underlying subcutaneous tissue to the level of the pectoralis major muscle.  The IPG  pocket was created deep to the subcutaneous layer and superficial to  the pectoralis muscle.  Placement of the stimulation lead was then undertaken by gentle dissection through the pectoralis major muscle parallel to the muscle fibers overlying the second rib interspace.  The subpectoral fat plane was then divided bluntly and the medial border of the external intercostal muscle was identified.  1 cm posterior to the medial border, dissection was carried through the external intercostal and a plane was developed in the second rib interspace.  The sensor lead was then tunneled into the second rib interspace and sutured with 3-0 silk suture to secure it in proper orientation with the sensing probe facing the pleural space.  The pectoralis major muscle dissection was then brought back into its normal anatomic position and the sense lead was brought out into the subcutaneous pocket.  The lead for the stimulating electrode was then tunneled in a subplatysmal fashion from the submandibular incision to the anterior chest wall incision.  The stimulating electrode and respiration sensing lead were connected to the implantable pulse generator.  Diagnostic evaluation was run confirming respiratory signal and good tongue protrusion on stimulation.  The implantable pulse generator was then placed in the subclavicular pocket and sutured to the pectoralis fascia with 2-0 silk sutures sutures.  The incisions were thoroughly irrigated with bacitracin saline irrigation.  The incisions were then closed in multiple layers with 4-0 and 5-0 Vicryl interrupted sutures in the deep and superficial subcutaneous levels at each incision site.  Dermabond surgical glue was used for skin closure.  The patient's incisions were dressed with rolled gauze and Hypafix tape for pressure dressing.    An orogastric tube was passed and stomach contents were aspirated. Patient was awakened from anesthetic and transferred from the operating room to the  recovery room in stable condition. There were no complications and blood loss was minimal.  X-rays were obtained in the recovery room to assess the proper location of the pulse generator, sensor lead and stimulation lead.   Delsa Bern, M.D. [facility] ENT 11/25/2020

## 2020-11-25 NOTE — Anesthesia Postprocedure Evaluation (Signed)
Anesthesia Post Note  Patient: Heather Espinoza  Procedure(s) Performed: IMPLANTATION OF HYPOGLOSSAL NERVE STIMULATOR (Right: Neck)     Patient location during evaluation: PACU Anesthesia Type: General Level of consciousness: awake and alert Pain management: pain level controlled Vital Signs Assessment: post-procedure vital signs reviewed and stable Respiratory status: spontaneous breathing, nonlabored ventilation, respiratory function stable and patient connected to nasal cannula oxygen Cardiovascular status: blood pressure returned to baseline and stable Postop Assessment: no apparent nausea or vomiting Anesthetic complications: no   No notable events documented.  Last Vitals:  Vitals:   11/25/20 1313 11/25/20 1325  BP:    Pulse: 82   Resp: 16   Temp:  36.6 C  SpO2: 96%     Last Pain:  Vitals:   11/25/20 1325  TempSrc:   PainSc: [facility]

## 2020-11-25 NOTE — Transfer of Care (Signed)
Immediate Anesthesia Transfer of Care Note  Patient: Heather Espinoza  Procedure(s) Performed: IMPLANTATION OF HYPOGLOSSAL NERVE STIMULATOR (Right: Neck)  Patient Location: PACU  Anesthesia Type:General  Level of Consciousness: awake, alert , oriented and patient cooperative  Airway & Oxygen Therapy: Patient Spontanous Breathing and Patient connected to face mask  Post-op Assessment: Report given to RN, Post -op Vital signs reviewed and stable and Patient moving all extremities X 4  Post vital signs: Reviewed and stable  Last Vitals:  Vitals Value Taken Time  BP 108/54 11/25/20 1210  Temp    Pulse 90 11/25/20 1213  Resp 16 11/25/20 1213  SpO2 99 % 11/25/20 1213  Vitals shown include unvalidated device data.  Last Pain:  Vitals:   11/25/20 0843  TempSrc:   PainSc: 0-No pain         Complications: No notable events documented.

## 2020-11-25 NOTE — Anesthesia Procedure Notes (Signed)
Procedure Name: Intubation Date/Time: 11/25/2020 9:36 AM Performed by: Jonna Munro, CRNA Pre-anesthesia Checklist: Patient identified, Emergency Drugs available, Suction available, Patient being monitored and Timeout performed Patient Re-evaluated:Patient Re-evaluated prior to induction Oxygen Delivery Method: Circle system utilized Preoxygenation: Pre-oxygenation with 100% oxygen Induction Type: IV induction and Rapid sequence Laryngoscope Size: Mac and 3 Grade View: Grade I Tube type: Oral Tube size: 7.0 mm Number of attempts: 1 Airway Equipment and Method: Stylet Placement Confirmation: ETT inserted through vocal cords under direct vision, positive ETCO2 and breath sounds checked- equal and bilateral Secured at: 21 cm Tube secured with: Tape Dental Injury: Teeth and Oropharynx as per pre-operative assessment

## 2020-11-28 ENCOUNTER — Encounter (HOSPITAL_COMMUNITY): Payer: Self-pay | Admitting: Otolaryngology

## 2020-11-28 ENCOUNTER — Other Ambulatory Visit (HOSPITAL_COMMUNITY): Payer: Medicare Other

## 2020-12-01 NOTE — Progress Notes (Deleted)
01/28/20- 45 yoF never smoker from [place], [place] for sleep evaluation Had seen Dr Valeta Harms for Dyspnea/ COPD, fam hx a1ATdef( MZ 87). Referred now by Warner Mccreedy, NP with suspected OSA. HST ordered but not done.  Medical problem list includes HTN, IBS, GERD, Hypothyroid, DM, Tremor, CKD3, Hypercholesterolemia,  Body weight today- 122 lbs [place]- 4 Covid vax- J&J Flu vax- today senior Sleep study 2005 at [facility]. CPAP then for a month, but face broke out from mask Since then has lost 35 lbs. Husband, truck driver away a lot, used to tell her that she snored. She minimizes daytime tiredness and snoring now, but some concern about breathing/ oxygen at night. ENT surgery + tonsils. Esophageal dilatation x 2.  12/02/20- 75 yoF never smoker from [place], [place] followed for OSA, complicated by Dyspnea/ COPD, HTN, IBS, GERD, Hypothyroid, DM, Tremor, CKD3, Hypercholesterolemia, -Advair 250, Albuterol hfa,  HST 04/27/20- AHI 16.9/ hr, desaturation to 84%, body weight 122 lbs Inspire device inserted 11/25/20 Dr Wilburn Cornelia   ROS-see HPI   + = positive Constitutional:   + weight loss, night sweats, fevers, chills, fatigue, lassitude. HEENT:    +headaches, +difficulty swallowing, +tooth/dental problems, sore throat,       +sneezing, itching, +ear ache, nasal congestion, post nasal drip, snoring CV:    chest pain, orthopnea, PND, swelling in lower extremities, anasarca,                                   dizziness, palpitations Resp:   +shortness of breath with exertion or at rest.                productive cough,   non-productive cough, coughing up of blood.              change in color of mucus.  wheezing.   Skin:    +rash or lesions. GI:  + heartburn, indigestion, +abdominal pain, nausea, vomiting, diarrhea,                 change in bowel habits, +loss of appetite GU: dysuria, change in color of urine, no urgency or frequency.   flank pain. MS:  + joint pain, stiffness, decreased range of motion, back  pain. Neuro-     nothing unusual Psych:  change in mood or affect.  +depression or +anxiety.   memory loss.  OBJ- Physical Exam General- Alert, Oriented, Affect-appropriate, Distress- none acute Skin- rash-none, lesions- none, excoriation- none Lymphadenopathy- none Head- atraumatic            Eyes- Gross vision intact, PERRLA, conjunctivae and secretions clear            Ears- Hearing, canals-normal            Nose- Clear, no-Septal dev, mucus, polyps, erosion, perforation             Throat- Mallampati III, mucosa clear , drainage- none, tonsils- absent, +teeth Neck- flexible , trachea midline, no stridor , thyroid nl, carotid no bruit Chest - symmetrical excursion , unlabored           Heart/CV- RRR , no murmur , no gallop  , no rub, nl s1 s2                           - JVD- none , edema- none, stasis changes- none, varices- none  Lung- clear to P&A, wheeze- none, cough- none , dullness-none, rub- none           Chest wall-  Abd-  Br/ Gen/ Rectal- Not done, not indicated Extrem- cyanosis- none, clubbing, none, atrophy- none, strength- nl Neuro- grossly intact to observation

## 2020-12-02 ENCOUNTER — Ambulatory Visit: Payer: Medicare Other | Admitting: Internal Medicine

## 2020-12-26 ENCOUNTER — Other Ambulatory Visit (HOSPITAL_BASED_OUTPATIENT_CLINIC_OR_DEPARTMENT_OTHER): Payer: Self-pay

## 2020-12-26 DIAGNOSIS — G4733 Obstructive sleep apnea (adult) (pediatric): Secondary | ICD-10-CM

## 2020-12-27 ENCOUNTER — Other Ambulatory Visit (INDEPENDENT_AMBULATORY_CARE_PROVIDER_SITE_OTHER): Payer: Self-pay | Admitting: Internal Medicine

## 2021-01-10 ENCOUNTER — Inpatient Hospital Stay (HOSPITAL_COMMUNITY)
Admission: EM | Admit: 2021-01-10 | Discharge: 2021-01-13 | DRG: 871 | Disposition: A | Discharge status: Home Health | Payer: Medicare Other | Attending: Internal Medicine | Admitting: Internal Medicine

## 2021-01-10 ENCOUNTER — Other Ambulatory Visit: Payer: Self-pay

## 2021-01-10 ENCOUNTER — Emergency Department (HOSPITAL_COMMUNITY): Payer: Medicare Other

## 2021-01-10 ENCOUNTER — Encounter (HOSPITAL_COMMUNITY): Payer: Self-pay

## 2021-01-10 DIAGNOSIS — Z91048 Other nonmedicinal substance allergy status: Secondary | ICD-10-CM

## 2021-01-10 DIAGNOSIS — Z8719 Personal history of other diseases of the digestive system: Secondary | ICD-10-CM

## 2021-01-10 DIAGNOSIS — K589 Irritable bowel syndrome without diarrhea: Secondary | ICD-10-CM | POA: Diagnosis present

## 2021-01-10 DIAGNOSIS — Z8616 Personal history of COVID-19: Secondary | ICD-10-CM

## 2021-01-10 DIAGNOSIS — A4151 Sepsis due to Escherichia coli [E. coli]: Secondary | ICD-10-CM | POA: Diagnosis not present

## 2021-01-10 DIAGNOSIS — Z90710 Acquired absence of both cervix and uterus: Secondary | ICD-10-CM

## 2021-01-10 DIAGNOSIS — M549 Dorsalgia, unspecified: Secondary | ICD-10-CM

## 2021-01-10 DIAGNOSIS — E1122 Type 2 diabetes mellitus with diabetic chronic kidney disease: Secondary | ICD-10-CM | POA: Diagnosis present

## 2021-01-10 DIAGNOSIS — Z79899 Other long term (current) drug therapy: Secondary | ICD-10-CM

## 2021-01-10 DIAGNOSIS — E782 Mixed hyperlipidemia: Secondary | ICD-10-CM

## 2021-01-10 DIAGNOSIS — Z79890 Hormone replacement therapy (postmenopausal): Secondary | ICD-10-CM

## 2021-01-10 DIAGNOSIS — E872 Acidosis, unspecified: Secondary | ICD-10-CM

## 2021-01-10 DIAGNOSIS — Z9049 Acquired absence of other specified parts of digestive tract: Secondary | ICD-10-CM

## 2021-01-10 DIAGNOSIS — I129 Hypertensive chronic kidney disease with stage 1 through stage 4 chronic kidney disease, or unspecified chronic kidney disease: Secondary | ICD-10-CM | POA: Diagnosis present

## 2021-01-10 DIAGNOSIS — N200 Calculus of kidney: Secondary | ICD-10-CM | POA: Diagnosis present

## 2021-01-10 DIAGNOSIS — N183 Chronic kidney disease, stage 3 unspecified: Secondary | ICD-10-CM | POA: Diagnosis present

## 2021-01-10 DIAGNOSIS — D72829 Elevated white blood cell count, unspecified: Secondary | ICD-10-CM

## 2021-01-10 DIAGNOSIS — K219 Gastro-esophageal reflux disease without esophagitis: Secondary | ICD-10-CM | POA: Diagnosis present

## 2021-01-10 DIAGNOSIS — I10 Essential (primary) hypertension: Secondary | ICD-10-CM | POA: Diagnosis present

## 2021-01-10 DIAGNOSIS — A419 Sepsis, unspecified organism: Secondary | ICD-10-CM

## 2021-01-10 DIAGNOSIS — E1165 Type 2 diabetes mellitus with hyperglycemia: Secondary | ICD-10-CM

## 2021-01-10 DIAGNOSIS — G9341 Metabolic encephalopathy: Secondary | ICD-10-CM | POA: Diagnosis present

## 2021-01-10 DIAGNOSIS — Z7984 Long term (current) use of oral hypoglycemic drugs: Secondary | ICD-10-CM

## 2021-01-10 DIAGNOSIS — Z91040 Latex allergy status: Secondary | ICD-10-CM

## 2021-01-10 DIAGNOSIS — Z888 Allergy status to other drugs, medicaments and biological substances status: Secondary | ICD-10-CM

## 2021-01-10 DIAGNOSIS — M8580 Other specified disorders of bone density and structure, unspecified site: Secondary | ICD-10-CM | POA: Diagnosis present

## 2021-01-10 DIAGNOSIS — E039 Hypothyroidism, unspecified: Secondary | ICD-10-CM | POA: Diagnosis present

## 2021-01-10 DIAGNOSIS — Z886 Allergy status to analgesic agent status: Secondary | ICD-10-CM

## 2021-01-10 DIAGNOSIS — Z87442 Personal history of urinary calculi: Secondary | ICD-10-CM

## 2021-01-10 DIAGNOSIS — Z883 Allergy status to other anti-infective agents status: Secondary | ICD-10-CM

## 2021-01-10 DIAGNOSIS — Z825 Family history of asthma and other chronic lower respiratory diseases: Secondary | ICD-10-CM

## 2021-01-10 DIAGNOSIS — G2581 Restless legs syndrome: Secondary | ICD-10-CM | POA: Diagnosis present

## 2021-01-10 DIAGNOSIS — R6520 Severe sepsis without septic shock: Secondary | ICD-10-CM | POA: Diagnosis present

## 2021-01-10 DIAGNOSIS — M797 Fibromyalgia: Secondary | ICD-10-CM | POA: Diagnosis present

## 2021-01-10 DIAGNOSIS — R4182 Altered mental status, unspecified: Secondary | ICD-10-CM

## 2021-01-10 DIAGNOSIS — Z6822 Body mass index (BMI) 22.0-22.9, adult: Secondary | ICD-10-CM

## 2021-01-10 DIAGNOSIS — Z87440 Personal history of urinary (tract) infections: Secondary | ICD-10-CM

## 2021-01-10 DIAGNOSIS — Z882 Allergy status to sulfonamides status: Secondary | ICD-10-CM

## 2021-01-10 DIAGNOSIS — D638 Anemia in other chronic diseases classified elsewhere: Secondary | ICD-10-CM

## 2021-01-10 DIAGNOSIS — E860 Dehydration: Secondary | ICD-10-CM

## 2021-01-10 DIAGNOSIS — G8929 Other chronic pain: Secondary | ICD-10-CM

## 2021-01-10 DIAGNOSIS — Z887 Allergy status to serum and vaccine status: Secondary | ICD-10-CM

## 2021-01-10 DIAGNOSIS — N12 Tubulo-interstitial nephritis, not specified as acute or chronic: Secondary | ICD-10-CM | POA: Diagnosis present

## 2021-01-10 DIAGNOSIS — J449 Chronic obstructive pulmonary disease, unspecified: Secondary | ICD-10-CM | POA: Diagnosis present

## 2021-01-10 DIAGNOSIS — D75839 Thrombocytosis, unspecified: Secondary | ICD-10-CM

## 2021-01-10 DIAGNOSIS — N1831 Chronic kidney disease, stage 3a: Secondary | ICD-10-CM | POA: Diagnosis present

## 2021-01-10 DIAGNOSIS — Z833 Family history of diabetes mellitus: Secondary | ICD-10-CM

## 2021-01-10 DIAGNOSIS — R627 Adult failure to thrive: Secondary | ICD-10-CM | POA: Diagnosis present

## 2021-01-10 MED ORDER — LACTATED RINGERS IV BOLUS (SEPSIS)
1000.0000 mL | Freq: Once | INTRAVENOUS | Status: AC
  Administered 2021-01-11: 1000 mL via INTRAVENOUS

## 2021-01-10 MED ORDER — SODIUM CHLORIDE 0.9 % IV SOLN
1.0000 g | Freq: Once | INTRAVENOUS | Status: AC
  Administered 2021-01-11: 1 g via INTRAVENOUS
  Filled 2021-01-10: qty 10

## 2021-01-10 MED ORDER — LACTATED RINGERS IV BOLUS (SEPSIS)
250.0000 mL | Freq: Once | INTRAVENOUS | Status: AC
  Administered 2021-01-11: 250 mL via INTRAVENOUS

## 2021-01-10 MED ORDER — LACTATED RINGERS IV BOLUS (SEPSIS)
500.0000 mL | Freq: Once | INTRAVENOUS | Status: AC
  Administered 2021-01-11: 500 mL via INTRAVENOUS

## 2021-01-10 NOTE — ED Notes (Signed)
ED Provider at bedside. 

## 2021-01-10 NOTE — ED Provider Notes (Signed)
[facility] Emergency Department Provider Note MRN:  [id number]  Arrival date & time: 01/11/21     Chief Complaint   Fever (lethargic)   History of Present Illness   Heather Espinoza is a 75 y.o. year-old female with a history of diabetes, COPD presenting to the ED with chief complaint of weakness.  Weakness, malaise, fatigue for the past 1 to 2 days.  Also with fever.  Chronic low back pain which is largely unchanged.  No cough or cold-like symptoms, no burning with urination.  No chest pain or shortness of breath, no abdominal pain.  Was acting abnormally at home, altered mental status.  I was unable to obtain an accurate HPI, PMH, or ROS due to the patient's altered mental status. Level 5 caveat.  Review of Systems  Positive for altered mental status, fever, weakness.    Patient's Health History    Past Medical History:  Diagnosis Date   Anemia    Arthritis    COPD (chronic obstructive pulmonary disease) ([place])    COVID-19 11/11/2020   Diabetes ([place])    Diverticulitis    Diverticulosis    Dyspnea    Fibromyalgia    High cholesterol    History of kidney stones    Hypertension    Hypothyroid    IBS (irritable bowel syndrome)    Osteopenia    Pneumonia    Restless leg    Sleep apnea    Doesn't use CPAP.     Tachycardia    per pt/fim   Tremor, essential 01/07/2018    Past Surgical History:  Procedure Laterality Date   ABDOMINAL HYSTERECTOMY     APPENDECTOMY     BACK SURGERY     BIOPSY  12/25/2016   Procedure: BIOPSY;  Surgeon: Rogene Houston, MD;  [facility];  Service: Endoscopy;;  gastric    BREAST LUMPECTOMY Right 03/20/1999   CATARACT EXTRACTION Bilateral 03/19/2014   CHOLECYSTECTOMY     COLONOSCOPY N/A 12/17/2012   Procedure: COLONOSCOPY;  Surgeon: Rogene Houston, MD;  [facility];  Service: Endoscopy;  Laterality: N/A;  215   COLONOSCOPY WITH PROPOFOL N/A 07/24/2019   Procedure: COLONOSCOPY WITH  PROPOFOL;  Surgeon: Rogene Houston, MD;  [facility];  Service: Endoscopy;  Laterality: N/A;  730   complete hysterectomy     DRUG INDUCED ENDOSCOPY N/A 09/02/2020   Procedure: DRUG INDUCED ENDOSCOPY;  Surgeon: Jerrell Belfast, MD;  Location: [facility];  Service: ENT;  Laterality: N/A;   ESOPHAGEAL DILATION N/A 12/25/2016   Procedure: ESOPHAGEAL DILATION;  Surgeon: Rogene Houston, MD;  [facility];  Service: Endoscopy;  Laterality: N/A;   ESOPHAGEAL DILATION N/A 07/24/2019   Procedure: ESOPHAGEAL DILATION;  Surgeon: Rogene Houston, MD;  [facility];  Service: Endoscopy;  Laterality: N/A;   ESOPHAGOGASTRODUODENOSCOPY N/A 12/25/2016   Procedure: ESOPHAGOGASTRODUODENOSCOPY (EGD);  Surgeon: Rogene Houston, MD;  [facility];  Service: Endoscopy;  Laterality: N/A;  730   ESOPHAGOGASTRODUODENOSCOPY (EGD) WITH PROPOFOL N/A 07/24/2019   Procedure: ESOPHAGOGASTRODUODENOSCOPY (EGD) WITH PROPOFOL;  Surgeon: Rogene Houston, MD;  [facility];  Service: Endoscopy;  Laterality: N/A;   Heel tumor removed     IMPLANTATION OF HYPOGLOSSAL NERVE STIMULATOR Right 11/25/2020   Procedure: IMPLANTATION OF HYPOGLOSSAL NERVE STIMULATOR;  Surgeon: Jerrell Belfast, MD;  Location: [facility];  Service: ENT;  Laterality: Right;   POLYPECTOMY  07/24/2019   Procedure:  POLYPECTOMY;  Surgeon: Rogene Houston, MD;  [facility];  Service: Endoscopy;;   rt elbow surgery     TONSILLECTOMY      Family History  Problem Relation Age of Onset   Uterine cancer Mother    Parkinson's disease Father    Stroke Father    Colon cancer Brother    Lung cancer Brother    COPD Sister    Diabetes Grandchild    Asthma Son     Social History   Socioeconomic History   Marital status: Married    Spouse name: Not on file   Number of children: 2   Years of education: College   Highest education level: Not on file  Occupational History    Occupation: Retired  Tobacco Use   Smoking status: Never   Smokeless tobacco: Never  Scientific laboratory technician Use: Never used  Substance and Sexual Activity   Alcohol use: No    Alcohol/week: 0.0 standard drinks   Drug use: No   Sexual activity: Not on file  Other Topics Concern   Not on file  Social History Narrative   Lives at home w/ her husband   Right-handed   Occasional caffeine: decaf tea, diet sodas   Social Determinants of Health   Financial Resource Strain: Not on file  Food Insecurity: Not on file  Transportation Needs: Not on file  Physical Activity: Not on file  Stress: Not on file  Social Connections: Not on file  Intimate Partner Violence: Not on file     Physical Exam   Vitals:   01/11/21 0148 01/11/21 0200  BP:  126/62  Pulse:  82  Resp:  15  Temp: 100.3 F (37.9 C)   SpO2:  94%    CONSTITUTIONAL: Chronically ill-appearing, NAD NEURO: Oriented to name, awake and alert, moves all extremities EYES:  eyes equal and reactive ENT/NECK:  no LAD, no JVD CARDIO: Tachycardic rate, well-perfused, normal S1 and S2 PULM:  CTAB no wheezing or rhonchi GI/GU:  normal bowel sounds, non-distended, non-tender MSK/SPINE:  No gross deformities, no edema SKIN:  no rash, atraumatic PSYCH:  Appropriate speech and behavior  *Additional and/or pertinent findings included in MDM below  Diagnostic and Interventional Summary    EKG Interpretation  Date/Time:  Wednesday January 11 2021 00:06:03 EDT Ventricular Rate:  89 PR Interval:  143 QRS Duration: 78 QT Interval:  375 QTC Calculation: 457 R Axis:   46 Text Interpretation: Sinus rhythm Confirmed by Gerlene Fee [phone]) on 01/11/2021 2:52:24 AM       Labs Reviewed  COMPREHENSIVE METABOLIC PANEL - Abnormal; Notable for the following components:      Result Value   Glucose, Bld 143 (*)    Creatinine, Ser 1.50 (*)    GFR, Estimated 37 (*)    All other components within normal limits  CBC WITH  DIFFERENTIAL/PLATELET - Abnormal; Notable for the following components:   WBC 27.4 (*)    RBC 3.50 (*)    Hemoglobin 11.4 (*)    HCT 34.2 (*)    Platelets 526 (*)    Neutro Abs 23.8 (*)    Monocytes Absolute 1.6 (*)    All other components within normal limits  URINALYSIS, ROUTINE W REFLEX MICROSCOPIC - Abnormal; Notable for the following components:   Hgb urine dipstick SMALL (*)    Leukocytes,Ua LARGE (*)    WBC, UA >50 (*)    Bacteria, UA RARE (*)    All other  components within normal limits  LACTIC ACID, PLASMA - Abnormal; Notable for the following components:   Lactic Acid, Venous 2.3 (*)    All other components within normal limits  APTT - Abnormal; Notable for the following components:   aPTT 37 (*)    All other components within normal limits  RESP PANEL BY RT-PCR (FLU A&B, COVID) ARPGX2  CULTURE, BLOOD (ROUTINE X 2)  CULTURE, BLOOD (ROUTINE X 2)  URINE CULTURE  LACTIC ACID, PLASMA  PROTIME-INR    CT HEAD WO CONTRAST (5MM)  Final Result    CT RENAL STONE STUDY  Final Result    DG Chest 2 View  Final Result      Medications  lactated ringers bolus 1,000 mL (0 mLs Intravenous Stopped 01/11/21 0201)    And  lactated ringers bolus 500 mL (0 mLs Intravenous Stopped 01/11/21 0144)    And  lactated ringers bolus 250 mL (0 mLs Intravenous Stopped 01/11/21 0144)  cefTRIAXone (ROCEPHIN) 1 g in sodium chloride 0.9 % 100 mL IVPB (0 g Intravenous Stopped 01/11/21 0148)     Procedures  /  Critical Care .Critical Care Performed by: Maudie Flakes, MD Authorized by: Maudie Flakes, MD   Critical care provider statement:    Critical care time (minutes):  35   Critical care time was exclusive of:  Separately billable procedures and treating other patients   Critical care was necessary to treat or prevent imminent or life-threatening deterioration of the following conditions:  Sepsis   Critical care was time spent personally by me on the following activities:  Ordering  and performing treatments and interventions, ordering and review of laboratory studies, ordering and review of radiographic studies, re-evaluation of patient's condition, examination of patient, discussions with consultants and evaluation of patient's response to treatment  ED Course and Medical Decision Making  I have reviewed the triage vital signs, the nursing notes, and pertinent available records from the EMR.  Listed above are laboratory and imaging tests that I personally ordered, reviewed, and interpreted and then considered in my medical decision making (see below for details).  Concern for sepsis given the fever, tachycardia, altered mental status, weakness.  Patient with hypotension as well.  Code sepsis initiated, unclear source.     Prominent leukocytosis, urinalysis with evidence of infection.  Will admit to medicine.  Vital signs improving with fluids.  Barth Kirks. Sedonia Small, [place] [email or web address]  Final Clinical Impressions(s) / ED Diagnoses     ICD-10-CM   1. Sepsis, due to unspecified organism, unspecified whether acute organ dysfunction present [facility])  A41.9       ED Discharge Orders     None        Discharge Instructions Discussed with and Provided to Patient:   Discharge Instructions   None       Maudie Flakes, MD 01/11/21 [phone]

## 2021-01-10 NOTE — ED Triage Notes (Addendum)
Pt presented to ED with spouse, pt says she has chronic back pain since she had surgery a year ago. Pt spouse says he drives trucks and got home and noticed pt was weaker than usual and thinks she may be dehydrated. Pt also c/o nausea

## 2021-01-11 ENCOUNTER — Other Ambulatory Visit: Payer: Self-pay

## 2021-01-11 ENCOUNTER — Emergency Department (HOSPITAL_COMMUNITY): Payer: Medicare Other

## 2021-01-11 DIAGNOSIS — M797 Fibromyalgia: Secondary | ICD-10-CM | POA: Diagnosis present

## 2021-01-11 DIAGNOSIS — R627 Adult failure to thrive: Secondary | ICD-10-CM | POA: Diagnosis present

## 2021-01-11 DIAGNOSIS — E1122 Type 2 diabetes mellitus with diabetic chronic kidney disease: Secondary | ICD-10-CM | POA: Diagnosis present

## 2021-01-11 DIAGNOSIS — E1165 Type 2 diabetes mellitus with hyperglycemia: Secondary | ICD-10-CM

## 2021-01-11 DIAGNOSIS — D72829 Elevated white blood cell count, unspecified: Secondary | ICD-10-CM

## 2021-01-11 DIAGNOSIS — R400 Somnolence: Secondary | ICD-10-CM | POA: Diagnosis not present

## 2021-01-11 DIAGNOSIS — E872 Acidosis, unspecified: Secondary | ICD-10-CM

## 2021-01-11 DIAGNOSIS — G2581 Restless legs syndrome: Secondary | ICD-10-CM | POA: Diagnosis present

## 2021-01-11 DIAGNOSIS — G8929 Other chronic pain: Secondary | ICD-10-CM | POA: Diagnosis present

## 2021-01-11 DIAGNOSIS — D638 Anemia in other chronic diseases classified elsewhere: Secondary | ICD-10-CM | POA: Diagnosis present

## 2021-01-11 DIAGNOSIS — E782 Mixed hyperlipidemia: Secondary | ICD-10-CM | POA: Diagnosis present

## 2021-01-11 DIAGNOSIS — K219 Gastro-esophageal reflux disease without esophagitis: Secondary | ICD-10-CM

## 2021-01-11 DIAGNOSIS — N12 Tubulo-interstitial nephritis, not specified as acute or chronic: Secondary | ICD-10-CM | POA: Diagnosis present

## 2021-01-11 DIAGNOSIS — R4182 Altered mental status, unspecified: Secondary | ICD-10-CM

## 2021-01-11 DIAGNOSIS — J449 Chronic obstructive pulmonary disease, unspecified: Secondary | ICD-10-CM | POA: Diagnosis present

## 2021-01-11 DIAGNOSIS — D75839 Thrombocytosis, unspecified: Secondary | ICD-10-CM | POA: Diagnosis present

## 2021-01-11 DIAGNOSIS — E039 Hypothyroidism, unspecified: Secondary | ICD-10-CM | POA: Diagnosis present

## 2021-01-11 DIAGNOSIS — K589 Irritable bowel syndrome without diarrhea: Secondary | ICD-10-CM | POA: Diagnosis present

## 2021-01-11 DIAGNOSIS — A419 Sepsis, unspecified organism: Secondary | ICD-10-CM

## 2021-01-11 DIAGNOSIS — A4151 Sepsis due to Escherichia coli [E. coli]: Secondary | ICD-10-CM | POA: Diagnosis present

## 2021-01-11 DIAGNOSIS — I129 Hypertensive chronic kidney disease with stage 1 through stage 4 chronic kidney disease, or unspecified chronic kidney disease: Secondary | ICD-10-CM | POA: Diagnosis present

## 2021-01-11 DIAGNOSIS — R6520 Severe sepsis without septic shock: Secondary | ICD-10-CM

## 2021-01-11 DIAGNOSIS — E860 Dehydration: Secondary | ICD-10-CM | POA: Diagnosis present

## 2021-01-11 DIAGNOSIS — N183 Chronic kidney disease, stage 3 unspecified: Secondary | ICD-10-CM

## 2021-01-11 DIAGNOSIS — N1831 Chronic kidney disease, stage 3a: Secondary | ICD-10-CM | POA: Diagnosis present

## 2021-01-11 DIAGNOSIS — M549 Dorsalgia, unspecified: Secondary | ICD-10-CM

## 2021-01-11 DIAGNOSIS — G9341 Metabolic encephalopathy: Secondary | ICD-10-CM | POA: Diagnosis present

## 2021-01-11 DIAGNOSIS — Z8616 Personal history of COVID-19: Secondary | ICD-10-CM | POA: Diagnosis not present

## 2021-01-11 DIAGNOSIS — I10 Essential (primary) hypertension: Secondary | ICD-10-CM

## 2021-01-11 LAB — CBC WITH DIFFERENTIAL/PLATELET
Band Neutrophils: 2 %
Basophils Absolute: 0 10*3/uL (ref 0.0–0.1)
Basophils Relative: 0 %
Eosinophils Absolute: 0 10*3/uL (ref 0.0–0.5)
Eosinophils Relative: 0 %
HCT: 34.2 % — ABNORMAL LOW (ref 36.0–46.0)
Hemoglobin: 11.4 g/dL — ABNORMAL LOW (ref 12.0–15.0)
Lymphocytes Relative: 7 %
Lymphs Abs: 1.9 10*3/uL (ref 0.7–4.0)
MCH: 32.6 pg (ref 26.0–34.0)
MCHC: 33.3 g/dL (ref 30.0–36.0)
MCV: 97.7 fL (ref 80.0–100.0)
Monocytes Absolute: 1.6 10*3/uL — ABNORMAL HIGH (ref 0.1–1.0)
Monocytes Relative: 6 %
Neutro Abs: 23.8 10*3/uL — ABNORMAL HIGH (ref 1.7–7.7)
Neutrophils Relative %: 85 %
Platelets: 526 10*3/uL — ABNORMAL HIGH (ref 150–400)
RBC: 3.5 MIL/uL — ABNORMAL LOW (ref 3.87–5.11)
RDW: 13.6 % (ref 11.5–15.5)
WBC: 27.4 10*3/uL — ABNORMAL HIGH (ref 4.0–10.5)
nRBC: 0 % (ref 0.0–0.2)

## 2021-01-11 LAB — BLOOD GAS, ARTERIAL
Acid-base deficit: 1.4 mmol/L (ref 0.0–2.0)
Bicarbonate: 23.5 mmol/L (ref 20.0–28.0)
FIO2: 21
O2 Saturation: 97 %
Patient temperature: 37
pCO2 arterial: 30.5 mmHg — ABNORMAL LOW (ref 32.0–48.0)
pH, Arterial: 7.468 — ABNORMAL HIGH (ref 7.350–7.450)
pO2, Arterial: 91.2 mmHg (ref 83.0–108.0)

## 2021-01-11 LAB — URINALYSIS, ROUTINE W REFLEX MICROSCOPIC
Bilirubin Urine: NEGATIVE
Glucose, UA: NEGATIVE mg/dL
Ketones, ur: NEGATIVE mg/dL
Nitrite: NEGATIVE
Protein, ur: NEGATIVE mg/dL
Specific Gravity, Urine: 1.005 (ref 1.005–1.030)
WBC, UA: >50 WBC/hpf — ABNORMAL HIGH (ref 0–5)
pH: 7 (ref 5.0–8.0)

## 2021-01-11 LAB — COMPREHENSIVE METABOLIC PANEL
ALT: 13 U/L (ref 0–44)
AST: 20 U/L (ref 15–41)
Albumin: 3.6 g/dL (ref 3.5–5.0)
Alkaline Phosphatase: 53 U/L (ref 38–126)
Anion gap: 12 (ref 5–15)
BUN: 16 mg/dL (ref 8–23)
CO2: 23 mmol/L (ref 22–32)
Calcium: 9 mg/dL (ref 8.9–10.3)
Chloride: 102 mmol/L (ref 98–111)
Creatinine, Ser: 1.5 mg/dL — ABNORMAL HIGH (ref 0.44–1.00)
GFR, Estimated: 37 mL/min — ABNORMAL LOW (ref >60)
Glucose, Bld: 143 mg/dL — ABNORMAL HIGH (ref 70–99)
Potassium: 4.7 mmol/L (ref 3.5–5.1)
Sodium: 137 mmol/L (ref 135–145)
Total Bilirubin: 0.9 mg/dL (ref 0.3–1.2)
Total Protein: 7.5 g/dL (ref 6.5–8.1)

## 2021-01-11 LAB — HEMOGLOBIN A1C
Hgb A1c MFr Bld: 6 % — ABNORMAL HIGH (ref 4.8–5.6)
Mean Plasma Glucose: 125.5 mg/dL

## 2021-01-11 LAB — BASIC METABOLIC PANEL
Anion gap: 8 (ref 5–15)
BUN: 15 mg/dL (ref 8–23)
CO2: 23 mmol/L (ref 22–32)
Calcium: 8.3 mg/dL — ABNORMAL LOW (ref 8.9–10.3)
Chloride: 104 mmol/L (ref 98–111)
Creatinine, Ser: 1.28 mg/dL — ABNORMAL HIGH (ref 0.44–1.00)
GFR, Estimated: 44 mL/min — ABNORMAL LOW (ref >60)
Glucose, Bld: 134 mg/dL — ABNORMAL HIGH (ref 70–99)
Potassium: 3.9 mmol/L (ref 3.5–5.1)
Sodium: 135 mmol/L (ref 135–145)

## 2021-01-11 LAB — LACTIC ACID, PLASMA
Lactic Acid, Venous: 1.7 mmol/L (ref 0.5–1.9)
Lactic Acid, Venous: 2.3 mmol/L (ref 0.5–1.9)
Lactic Acid, Venous: 2.3 mmol/L (ref 0.5–1.9)
Lactic Acid, Venous: 1.4 mmol/L (ref 0.5–1.9)

## 2021-01-11 LAB — RESP PANEL BY RT-PCR (FLU A&B, COVID) ARPGX2
Influenza A by PCR: NEGATIVE
Influenza B by PCR: NEGATIVE
SARS Coronavirus 2 by RT PCR: NEGATIVE

## 2021-01-11 LAB — CBG MONITORING, ED
Glucose-Capillary: 132 mg/dL — ABNORMAL HIGH (ref 70–99)
Glucose-Capillary: 171 mg/dL — ABNORMAL HIGH (ref 70–99)
Glucose-Capillary: 142 mg/dL — ABNORMAL HIGH (ref 70–99)
Glucose-Capillary: 110 mg/dL — ABNORMAL HIGH (ref 70–99)

## 2021-01-11 LAB — PHOSPHORUS: Phosphorus: 2.4 mg/dL — ABNORMAL LOW (ref 2.5–4.6)

## 2021-01-11 LAB — PROTIME-INR
INR: 1.1 (ref 0.8–1.2)
Prothrombin Time: 14.6 seconds (ref 11.4–15.2)

## 2021-01-11 LAB — MAGNESIUM: Magnesium: 1.2 mg/dL — ABNORMAL LOW (ref 1.7–2.4)

## 2021-01-11 LAB — APTT: aPTT: 37 seconds — ABNORMAL HIGH (ref 24–36)

## 2021-01-11 MED ORDER — ENOXAPARIN SODIUM 30 MG/0.3ML IJ SOSY
30.0000 mg | PREFILLED_SYRINGE | INTRAMUSCULAR | Status: DC
  Administered 2021-01-11 – 2021-01-13 (×3): 30 mg via SUBCUTANEOUS
  Filled 2021-01-11 (×3): qty 0.3

## 2021-01-11 MED ORDER — FAMOTIDINE 20 MG PO TABS
20.0000 mg | ORAL_TABLET | Freq: Every day | ORAL | Status: DC
  Administered 2021-01-11 – 2021-01-12 (×2): 20 mg via ORAL
  Filled 2021-01-11 (×2): qty 1

## 2021-01-11 MED ORDER — SODIUM CHLORIDE 0.9 % IV SOLN: 1.0000 g | INTRAVENOUS | Status: DC

## 2021-01-11 MED ORDER — ONDANSETRON HCL 4 MG PO TABS: 4.0000 mg | ORAL_TABLET | Freq: Once | ORAL | Status: DC

## 2021-01-11 MED ORDER — ALBUTEROL SULFATE (2.5 MG/3ML) 0.083% IN NEBU: 2.5000 mg | INHALATION_SOLUTION | Freq: Four times a day (QID) | RESPIRATORY_TRACT | Status: DC | PRN

## 2021-01-11 MED ORDER — POTASSIUM PHOSPHATES 15 MMOLE/5ML IV SOLN
30.0000 mmol | Freq: Once | INTRAVENOUS | Status: AC
  Administered 2021-01-11: 30 mmol via INTRAVENOUS
  Filled 2021-01-11: qty 10

## 2021-01-11 MED ORDER — ONDANSETRON HCL 4 MG PO TABS: 4.0000 mg | ORAL_TABLET | Freq: Three times a day (TID) | ORAL | Status: DC | PRN

## 2021-01-11 MED ORDER — LEVOTHYROXINE SODIUM 75 MCG PO TABS
37.5000 ug | ORAL_TABLET | Freq: Every day | ORAL | Status: DC
  Administered 2021-01-11 – 2021-01-13 (×3): 37.5 ug via ORAL
  Filled 2021-01-11 (×2): qty 0.5
  Filled 2021-01-11 (×2): qty 1
  Filled 2021-01-11: qty 0.5

## 2021-01-11 MED ORDER — LISINOPRIL 10 MG PO TABS: 10.0000 mg | ORAL_TABLET | Freq: Every morning | ORAL | Status: DC

## 2021-01-11 MED ORDER — ALBUTEROL SULFATE HFA 108 (90 BASE) MCG/ACT IN AERS: 2.0000 | INHALATION_SPRAY | Freq: Four times a day (QID) | RESPIRATORY_TRACT | Status: DC | PRN

## 2021-01-11 MED ORDER — MAGNESIUM SULFATE 2 GM/50ML IV SOLN
2.0000 g | Freq: Once | INTRAVENOUS | Status: AC
  Administered 2021-01-11: 2 g via INTRAVENOUS
  Filled 2021-01-11: qty 50

## 2021-01-11 MED ORDER — TRAZODONE HCL 50 MG PO TABS
100.0000 mg | ORAL_TABLET | Freq: Every day | ORAL | Status: DC
  Administered 2021-01-11 – 2021-01-12 (×2): 100 mg via ORAL
  Filled 2021-01-11 (×2): qty 2

## 2021-01-11 MED ORDER — SODIUM CHLORIDE 0.9 % IV SOLN: 2.0000 g | INTRAVENOUS | Status: DC

## 2021-01-11 MED ORDER — ACETAMINOPHEN 325 MG PO TABS
650.0000 mg | ORAL_TABLET | Freq: Four times a day (QID) | ORAL | Status: DC | PRN
  Administered 2021-01-11 – 2021-01-12 (×3): 650 mg via ORAL
  Filled 2021-01-11 (×3): qty 2

## 2021-01-11 MED ORDER — SIMVASTATIN 20 MG PO TABS
20.0000 mg | ORAL_TABLET | Freq: Every evening | ORAL | Status: DC
  Administered 2021-01-11 – 2021-01-13 (×3): 20 mg via ORAL
  Filled 2021-01-11 (×2): qty 1
  Filled 2021-01-11: qty 2

## 2021-01-11 MED ORDER — PANTOPRAZOLE SODIUM 40 MG PO TBEC
40.0000 mg | DELAYED_RELEASE_TABLET | Freq: Every day | ORAL | Status: DC
  Administered 2021-01-11 – 2021-01-13 (×3): 40 mg via ORAL
  Filled 2021-01-11 (×3): qty 1

## 2021-01-11 MED ORDER — SODIUM CHLORIDE 0.9 % IV SOLN: Freq: Once | INTRAVENOUS | Status: AC

## 2021-01-11 MED ORDER — MIDODRINE HCL 5 MG PO TABS
2.5000 mg | ORAL_TABLET | Freq: Three times a day (TID) | ORAL | Status: DC
  Administered 2021-01-11 – 2021-01-12 (×6): 2.5 mg via ORAL
  Filled 2021-01-11 (×6): qty 1

## 2021-01-11 MED ORDER — INSULIN ASPART 100 UNIT/ML IJ SOLN
0.0000 [IU] | INTRAMUSCULAR | Status: DC
  Administered 2021-01-11: 1 [IU] via SUBCUTANEOUS
  Administered 2021-01-11: 2 [IU] via SUBCUTANEOUS
  Administered 2021-01-12: 1 [IU] via SUBCUTANEOUS
  Administered 2021-01-12 – 2021-01-13 (×2): 2 [IU] via SUBCUTANEOUS
  Filled 2021-01-11 (×2): qty 1

## 2021-01-11 MED ORDER — SODIUM CHLORIDE 0.9 % IV SOLN
INTRAVENOUS | Status: AC
  Administered 2021-01-11: 1000 mL via INTRAVENOUS

## 2021-01-11 NOTE — ED Notes (Signed)
Bgl 132

## 2021-01-11 NOTE — ED Notes (Signed)
Patient transported to CT 

## 2021-01-11 NOTE — Progress Notes (Signed)
Patient is admitted this am, detail please see hpi Patient is seen and examined, she appears Confused, not oriented to time, some suprapubic tenderness, can not tell me when is her last bm, wants her husband to be here. Bp low normal, restart hydration, hold home bp meds, start midodrine,  Blood culture + g-rod, increase rocephin to 2g , f/u on culture result

## 2021-01-11 NOTE — ED Notes (Signed)
Took pt meal tray pt refused breakfast and lunch

## 2021-01-11 NOTE — Sepsis Progress Note (Signed)
Following per sepsis protocol   

## 2021-01-11 NOTE — ED Notes (Signed)
Date and time results received: 01/11/21 2:29 AM  Test: Lactic Acid Critical Value: 2.3  Name of Provider Notified: Dr. Sedonia Small  Orders Received? Or Actions Taken?:

## 2021-01-11 NOTE — H&P (Signed)
History and Physical  Heather Espinoza ERD:[id number] DOB: 1946/03/08 DOA: 01/10/2021  Referring physician: Maudie Flakes, MD PCP: London Pepper, MD  Patient coming from: Home  Chief Complaint: Fever, altered mental status  HPI: Heather Espinoza is a 75 y.o. female with medical history significant for GERD, restless leg syndrome, COPD, hypothyroidism, hyperlipidemia, type 2 diabetes mellitus and chronic back pain who presents to the emergency department accompanied by husband.  Patient was unable to provide history, history was obtained from ED physician and husband at bedside.  Per husband, he states that when he returned home yesterday, he noted that patient was weaker than normal and mental status was also altered, so he thought that patient may be dehydrated, so EMS was activated and patient was taken to the ED for further evaluation and management.  There was no report of chest pain, shortness of breath, abdominal, nausea, vomiting, cough or cold symptoms.  ED Course:  In the emergency department, she was febrile with a temperature of 102F, she was tachycardic and tachypneic.  Work-up in the ED showed leukocytosis with WBC at 27.4, normocytic anemia, thrombocytosis, lactic acid 1.7 > 2.3 > 2.3.  Urinalysis was positive for large leukocytes, > 50 WBC and rare bacteria.  Influenza A, B, SARS coronavirus 2 was negative.   CT of head without contrast showed no acute intracranial abnormality CT abdomen and pelvis without contrast showed 1 cm left inferior pole renal stone with mild fullness of the inferior pole collecting systems and mild left perinephric stranding. Correlation with urinalysis recommended to exclude UTI Chest x-ray showed no active cardiopulmonary disease She was treated with IV ceftriaxone, IV hydration was provided per sepsis protocol.  Hospitalist was asked to admit patient for further evaluation and management.  Review of Systems: This cannot be obtained at this time due to the  patient's altered mental status.   Past Medical History:  Diagnosis Date   Anemia    Arthritis    COPD (chronic obstructive pulmonary disease) ([place])    COVID-19 11/11/2020   Diabetes ([place])    Diverticulitis    Diverticulosis    Dyspnea    Fibromyalgia    High cholesterol    History of kidney stones    Hypertension    Hypothyroid    IBS (irritable bowel syndrome)    Osteopenia    Pneumonia    Restless leg    Sleep apnea    Doesn't use CPAP.     Tachycardia    per pt/fim   Tremor, essential 01/07/2018   Past Surgical History:  Procedure Laterality Date   ABDOMINAL HYSTERECTOMY     APPENDECTOMY     BACK SURGERY     BIOPSY  12/25/2016   Procedure: BIOPSY;  Surgeon: Rogene Houston, MD;  [facility];  Service: Endoscopy;;  gastric    BREAST LUMPECTOMY Right 03/20/1999   CATARACT EXTRACTION Bilateral 03/19/2014   CHOLECYSTECTOMY     COLONOSCOPY N/A 12/17/2012   Procedure: COLONOSCOPY;  Surgeon: Rogene Houston, MD;  [facility];  Service: Endoscopy;  Laterality: N/A;  215   COLONOSCOPY WITH PROPOFOL N/A 07/24/2019   Procedure: COLONOSCOPY WITH PROPOFOL;  Surgeon: Rogene Houston, MD;  [facility];  Service: Endoscopy;  Laterality: N/A;  730   complete hysterectomy     DRUG INDUCED ENDOSCOPY N/A 09/02/2020   Procedure: DRUG INDUCED ENDOSCOPY;  Surgeon: Jerrell Belfast, MD;  Location: [facility];  Service: ENT;  Laterality: N/A;  ESOPHAGEAL DILATION N/A 12/25/2016   Procedure: ESOPHAGEAL DILATION;  Surgeon: Rogene Houston, MD;  [facility];  Service: Endoscopy;  Laterality: N/A;   ESOPHAGEAL DILATION N/A 07/24/2019   Procedure: ESOPHAGEAL DILATION;  Surgeon: Rogene Houston, MD;  [facility];  Service: Endoscopy;  Laterality: N/A;   ESOPHAGOGASTRODUODENOSCOPY N/A 12/25/2016   Procedure: ESOPHAGOGASTRODUODENOSCOPY (EGD);  Surgeon: Rogene Houston, MD;  [facility];  Service:  Endoscopy;  Laterality: N/A;  730   ESOPHAGOGASTRODUODENOSCOPY (EGD) WITH PROPOFOL N/A 07/24/2019   Procedure: ESOPHAGOGASTRODUODENOSCOPY (EGD) WITH PROPOFOL;  Surgeon: Rogene Houston, MD;  [facility];  Service: Endoscopy;  Laterality: N/A;   Heel tumor removed     IMPLANTATION OF HYPOGLOSSAL NERVE STIMULATOR Right 11/25/2020   Procedure: IMPLANTATION OF HYPOGLOSSAL NERVE STIMULATOR;  Surgeon: Jerrell Belfast, MD;  Location: [facility];  Service: ENT;  Laterality: Right;   POLYPECTOMY  07/24/2019   Procedure: POLYPECTOMY;  Surgeon: Rogene Houston, MD;  [facility];  Service: Endoscopy;;   rt elbow surgery     TONSILLECTOMY      Social History:  reports that she has never smoked. She has never used smokeless tobacco. She reports that she does not drink alcohol and does not use drugs.   Allergies  Allergen Reactions   Flagyl [Metronidazole] Shortness Of Breath and Swelling   Lyrica [Pregabalin] Shortness Of Breath   Adhesive [Tape] Other (See Comments)    Takes off patient's skin   Aspirin Other (See Comments)    Stomach bleed   Latex Itching and Rash   Phenergan [Promethazine Hcl] Other (See Comments)    Caused grogginess, altered mental status   Prednisone Anxiety   Sulfa Antibiotics Nausea Only   Tetanus Toxoids Swelling    Arm Area    Family History  Problem Relation Age of Onset   Uterine cancer Mother    Parkinson's disease Father    Stroke Father    Colon cancer Brother    Lung cancer Brother    COPD Sister    Diabetes Grandchild    Asthma Son      Prior to Admission medications   Medication Sig Start Date End Date Taking? Authorizing Provider  acetaminophen (TYLENOL) 500 MG tablet Take 1,000 mg by mouth every 6 (six) hours as needed for moderate pain.    [provider]  albuterol (PROVENTIL HFA;VENTOLIN HFA) 108 (90 Base) MCG/ACT inhaler Inhale 2 puffs into the lungs every 6 (six) hours as needed for wheezing or shortness of  breath.    [provider]  Cyanocobalamin (B-12) 2500 MCG TABS Take 2,500 mcg by mouth daily.    [provider]  dicyclomine (BENTYL) 10 MG capsule Take 1 capsule (10 mg total) by mouth 2 (two) times daily before a meal. 09/27/20   Rehman, Mechele Dawley, MD  docusate sodium (COLACE) 100 MG capsule Take 2 capsules (200 mg total) by mouth at bedtime. Patient taking differently: Take 100 mg by mouth daily as needed for moderate constipation. 01/04/15   Rehman, Mechele Dawley, MD  DULoxetine (CYMBALTA) 30 MG capsule Take 30 mg by mouth daily.    [provider]  esomeprazole (NEXIUM) 40 MG capsule Take 1 capsule (40 mg total) by mouth daily before breakfast. 09/27/20   Rehman, Mechele Dawley, MD  famotidine (PEPCID) 40 MG tablet Take 1 tablet (40 mg total) by mouth at bedtime. 09/27/20   Rogene Houston, MD  Fluticasone-Salmeterol (ADVAIR) 250-50 MCG/DOSE AEPB  Inhale 1 puff into the lungs every 12 (twelve) hours.    [provider]  gabapentin (NEURONTIN) 100 MG capsule Take 100 mg by mouth 2 (two) times daily.    [provider]  HYDROcodone-acetaminophen (NORCO) 5-325 MG tablet 1 to 2 tablets every 12 hours as needed for postoperative pain.  Alternate with ibuprofen 600 mg every 6 hours.  May substitute Tylenol for narcotic pain medication. 11/25/20   Jerrell Belfast, MD  levothyroxine (SYNTHROID, LEVOTHROID) 75 MCG tablet Take 37.5 mcg by mouth daily before breakfast.     [provider]  lisinopril (PRINIVIL,ZESTRIL) 10 MG tablet Take 10 mg by mouth in the morning.    [provider]  metFORMIN (GLUCOPHAGE-XR) 500 MG 24 hr tablet Take 500 mg by mouth daily with supper. 01/12/19   [provider]  neomycin-bacitracin-polymyxin (NEOSPORIN) ointment Apply 1 application topically as needed for wound care.    [provider]  ondansetron (ZOFRAN) 4 MG tablet TAKE 1 TABLET BY MOUTH TWICE DAILY AS NEEDED FOR NAUSEA OR VOMITING 12/27/20   Rehman,  Mechele Dawley, MD  polyethylene glycol powder (GLYCOLAX/MIRALAX) 17 GM/SCOOP powder Take 8.5 g by mouth daily. Patient not taking: No sig reported 06/30/19   Rogene Houston, MD  simvastatin (ZOCOR) 20 MG tablet Take 20 mg by mouth every evening.    [provider]  traMADol (ULTRAM) 50 MG tablet Take 50-100 mg by mouth every 6 (six) hours as needed for moderate pain. 06/13/19   [provider]  traZODone (DESYREL) 100 MG tablet Take 50-100 mg by mouth at bedtime.    [provider]    Physical Exam: BP (!) 130/56   Pulse (!) 103   Temp (!) 102 F (38.9 C) (Oral)   Resp 15   Ht $R'5\' 1"'et$  (1.549 m)   Wt 54 kg   SpO2 95%   BMI 22.49 kg/m   General: 75 y.o. year-old female ill appearing but in no acute distress.   HEENT: Dry mucous membrane.  NCAT, EOMI Neck: Supple, trachea medial Cardiovascular: Regular rate and rhythm with no rubs or gallops.  No thyromegaly or JVD noted.  No lower extremity edema. 2/4 pulses in all 4 extremities. Respiratory: Clear to auscultation with no wheezes or rales. Good inspiratory effort. Abdomen: Soft, nontender nondistended with normal bowel sounds x4 quadrants. Muskuloskeletal: No cyanosis, clubbing or edema noted bilaterally Neuro: Patient was moving all 4 extremities, however she was nonverbal but did not respond to tactile stimuli.  sensation, reflexes intact Skin: No ulcerative lesions noted or rashes Psychiatry: Mood is appropriate for condition and setting          Labs on Admission:  Basic Metabolic Panel: Recent Labs  Lab 01/10/21 2331 01/11/21 0635  NA 137  --   K 4.7  --   CL 102  --   CO2 23  --   GLUCOSE 143*  --   BUN 16  --   CREATININE 1.50*  --   CALCIUM 9.0  --   MG  --  1.2*  PHOS  --  2.4*   Liver Function Tests: Recent Labs  Lab 01/10/21 2331  AST 20  ALT 13  ALKPHOS 53  BILITOT 0.9  PROT 7.5  ALBUMIN 3.6   No results for input(s): LIPASE, AMYLASE in the last 168 hours. No results for  input(s): AMMONIA in the last 168 hours. CBC: Recent Labs  Lab 01/10/21 2331  WBC 27.4*  NEUTROABS 23.8*  HGB 11.4*  HCT 34.2*  MCV 97.7  PLT 526*   Cardiac Enzymes: No results for input(s): CKTOTAL, CKMB, CKMBINDEX, TROPONINI in the last 168 hours.  BNP (last 3 results) No results for input(s): BNP in the last 8760 hours.  ProBNP (last 3 results) No results for input(s): PROBNP in the last 8760 hours.  CBG: No results for input(s): GLUCAP in the last 168 hours.  Radiological Exams on Admission: DG Chest 2 View  Result Date: 01/10/2021 CLINICAL DATA:  Back pain and lethargy. EXAM: CHEST - 2 VIEW COMPARISON:  Chest radiograph dated 11/25/2020. FINDINGS: The lungs are clear. There is no pleural effusion pneumothorax. The cardiac silhouette is within limits. No acute osseous pathology. Stimulator over the anterior chest wall. IMPRESSION: No active cardiopulmonary disease. Electronically Signed   By: Anner Crete M.D.   On: 01/10/2021 22:57   CT HEAD WO CONTRAST (5MM)  Result Date: 01/11/2021 CLINICAL DATA:  Altered mental status. EXAM: CT HEAD WITHOUT CONTRAST TECHNIQUE: Contiguous axial images were obtained from the base of the skull through the vertex without intravenous contrast. COMPARISON:  None. FINDINGS: Brain: There is mild cerebral atrophy with widening of the extra-axial spaces and ventricular dilatation. There are areas of decreased attenuation within the white matter tracts of the supratentorial brain, consistent with microvascular disease changes. Mild, bilateral basal ganglia calcification is seen. Vascular: No hyperdense vessels are identified. Skull: Normal. Negative for fracture or focal lesion. Sinuses/Orbits: There is mild to moderate severity left maxillary sinus mucosal thickening. Other: None. IMPRESSION: No acute intracranial abnormality. Electronically Signed   By: Virgina Norfolk M.D.   On: 01/11/2021 00:53   CT RENAL STONE STUDY  Result Date:  01/11/2021 CLINICAL DATA:  Flank pain.  Concern for kidney stone. EXAM: CT ABDOMEN AND PELVIS WITHOUT CONTRAST TECHNIQUE: Multidetector CT imaging of the abdomen and pelvis was performed following the standard protocol without IV contrast. COMPARISON:  CT abdomen pelvis dated 07/30/2020. FINDINGS: Evaluation of this exam is limited in the absence of intravenous contrast. Lower chest: The visualized lung bases are clear. No intra-abdominal free air or free fluid. Hepatobiliary: The liver is unremarkable. No intrahepatic biliary dilatation. Cholecystectomy. Pancreas: Unremarkable. No pancreatic ductal dilatation or surrounding inflammatory changes. Spleen: Several small scattered calcified splenic granuloma. Adrenals/Urinary Tract: The adrenal glands are unremarkable. There is a 1 cm stone in the inferior pole of the left kidney with mild fullness of the inferior pole collecting systems. There is mild left perinephric stranding. The right kidney is unremarkable. The visualized ureters and urinary bladder appear unremarkable. Stomach/Bowel: There is sigmoid diverticulosis without active inflammatory changes. Moderate stool throughout the colon. There is no bowel obstruction or active inflammation. Appendectomy. Vascular/Lymphatic: Mild aortoiliac atherosclerotic disease. The IVC is unremarkable. No portal venous gas. There is no adenopathy. Reproductive: Hysterectomy.  No adnexal masses. Other: None Musculoskeletal: Lower lumbar fusion hardware. No acute osseous pathology. IMPRESSION: 1. A 1 cm left inferior pole renal stone with mild fullness of the inferior pole collecting systems and mild left perinephric stranding. Correlation with urinalysis recommended to exclude UTI. 2. Sigmoid diverticulosis. No bowel obstruction. 3. Aortic Atherosclerosis (ICD10-I70.0). Electronically Signed   By: Anner Crete M.D.   On: 01/11/2021 00:46    EKG: I independently viewed the EKG done and my findings are as followed: Normal  sinus rhythm at a rate of 89 bpm  Assessment/Plan Present on Admission:  CKD (chronic kidney disease), stage III ([place])  Essential hypertension, benign  Acquired hypothyroidism  GERD (gastroesophageal reflux disease)  COPD (chronic  obstructive pulmonary disease) ([facility])  Principal Problem:   Severe sepsis ([facility]) Active Problems:   Essential hypertension, benign   Acquired hypothyroidism   GERD (gastroesophageal reflux disease)   COPD (chronic obstructive pulmonary disease) ([facility])   CKD (chronic kidney disease), stage III ([facility])   Altered mental status   Leukocytosis   Hyperglycemia due to diabetes mellitus ([facility])   Lactic acidosis   Anemia of chronic disease   Chronic back pain   Thrombocytosis   Mixed hyperlipidemia   Hypomagnesemia   Hypophosphatemia   Altered mental status possibly secondary to severe sepsis secondary to UTI POA Patient was febrile, WBC was 27.4 (met SIRS criteria), source of infection was urinary tract, lactic acid was 2.3 department criteria for severe sepsis IV hydration per sepsis protocol was provided Patient was started on IV ceftriaxone, we shall continue same at this time Continue Tylenol as needed Blood culture and urine culture pending  Leukocytosis secondary to above Continue management as described above  Lactic acidosis Lactic acid 1.7 > 2.3 > 2.3 Continue to trend lactic acid  Hypomagnesemia Mg level is 1.2 This will be replenished Please continue to monitor Mg level and correct accordingly  Hypophosphatemia Phosphorus level was 2.4 This will be replenished Please continue to monitor phosphorus level and correct accordingly  Dehydration Continue IV hydration  Hyperglycemia secondary to type 2 diabetes mellitus Continue insulin sliding scale and hypoglycemic protocol Metformin will be held at this time  Thrombocytosis possibly reactive Platelets 526; continue to monitor platelet levels  Anemia of chronic disease Stable,  hemoglobin 11.4, this is currently within baseline range  Chronic kidney disease stage 3A/3B Stable, Renally adjust medications, avoid nephrotoxic agents/dehydration/hypotension  Essential hypertension (controlled) Continue lisinopril  Mixed hyperlipidemia Continue Zocor  Acquired hypothyroidism Continue Synthroid   GERD Continue Protonix, famotidine  Chronic back pain Continue Tylenol as needed Home Norco temporarily held due to altered mental status  COPD Continue albuterol per home regimen  DVT prophylaxis: Lovenox  Code Status: Full code  Family Communication: Husband at bedside (all questions answered to satisfaction)  Disposition Plan:  Patient is from:                        home Anticipated DC to:                   SNF or family members home Anticipated DC date:               2-3 days Anticipated DC barriers:          Patient requires inpatient management due to urosepsis requiring IV antibiotics    Consults called: None  Admission status: Inpatient    Bernadette Hoit MD [facility]  01/11/2021, 7:32 AM

## 2021-01-11 NOTE — ED Notes (Signed)
Blood cultures obtained before antibiotic administration

## 2021-01-11 NOTE — ED Notes (Signed)
Pt is confused.  States taht she want to go home.  Pt walked into the hall with no gown.  Pt was bleeding from where she pulled out her iv.  .  Both ivs removed by pt.  Pt knows name but doesn't understand why she is here.  Bleeding controlled and new iv started.  Pt is hypotensive.  Iv bolus of ns started.

## 2021-01-12 DIAGNOSIS — R6520 Severe sepsis without septic shock: Secondary | ICD-10-CM | POA: Diagnosis not present

## 2021-01-12 DIAGNOSIS — A419 Sepsis, unspecified organism: Secondary | ICD-10-CM | POA: Diagnosis not present

## 2021-01-12 LAB — BLOOD CULTURE ID PANEL (REFLEXED) - BCID2

## 2021-01-12 LAB — CBC
HCT: 25.4 % — ABNORMAL LOW (ref 36.0–46.0)
Hemoglobin: 8.2 g/dL — ABNORMAL LOW (ref 12.0–15.0)
MCH: 32.7 pg (ref 26.0–34.0)
MCHC: 32.3 g/dL (ref 30.0–36.0)
MCV: 101.2 fL — ABNORMAL HIGH (ref 80.0–100.0)
Platelets: 326 10*3/uL (ref 150–400)
RBC: 2.51 MIL/uL — ABNORMAL LOW (ref 3.87–5.11)
RDW: 13.7 % (ref 11.5–15.5)
WBC: 15.7 10*3/uL — ABNORMAL HIGH (ref 4.0–10.5)
nRBC: 0 % (ref 0.0–0.2)

## 2021-01-12 LAB — COMPREHENSIVE METABOLIC PANEL
ALT: 8 U/L (ref 0–44)
AST: 13 U/L — ABNORMAL LOW (ref 15–41)
Albumin: 2.5 g/dL — ABNORMAL LOW (ref 3.5–5.0)
Alkaline Phosphatase: 42 U/L (ref 38–126)
Anion gap: 8 (ref 5–15)
BUN: 11 mg/dL (ref 8–23)
CO2: 20 mmol/L — ABNORMAL LOW (ref 22–32)
Calcium: 7.7 mg/dL — ABNORMAL LOW (ref 8.9–10.3)
Chloride: 108 mmol/L (ref 98–111)
Creatinine, Ser: 1.05 mg/dL — ABNORMAL HIGH (ref 0.44–1.00)
GFR, Estimated: 56 mL/min — ABNORMAL LOW (ref >60)
Glucose, Bld: 131 mg/dL — ABNORMAL HIGH (ref 70–99)
Potassium: 4 mmol/L (ref 3.5–5.1)
Sodium: 136 mmol/L (ref 135–145)
Total Bilirubin: 0.5 mg/dL (ref 0.3–1.2)
Total Protein: 5.7 g/dL — ABNORMAL LOW (ref 6.5–8.1)

## 2021-01-12 LAB — PROTIME-INR
INR: 1.3 — ABNORMAL HIGH (ref 0.8–1.2)
Prothrombin Time: 16.3 seconds — ABNORMAL HIGH (ref 11.4–15.2)

## 2021-01-12 LAB — CBG MONITORING, ED: Glucose-Capillary: 133 mg/dL — ABNORMAL HIGH (ref 70–99)

## 2021-01-12 LAB — GLUCOSE, CAPILLARY
Glucose-Capillary: 113 mg/dL — ABNORMAL HIGH (ref 70–99)
Glucose-Capillary: 124 mg/dL — ABNORMAL HIGH (ref 70–99)
Glucose-Capillary: 128 mg/dL — ABNORMAL HIGH (ref 70–99)
Glucose-Capillary: 118 mg/dL — ABNORMAL HIGH (ref 70–99)
Glucose-Capillary: 159 mg/dL — ABNORMAL HIGH (ref 70–99)

## 2021-01-12 LAB — MRSA NEXT GEN BY PCR, NASAL: MRSA by PCR Next Gen: NOT DETECTED

## 2021-01-12 LAB — APTT: aPTT: 40 seconds — ABNORMAL HIGH (ref 24–36)

## 2021-01-12 LAB — MAGNESIUM: Magnesium: 1.7 mg/dL (ref 1.7–2.4)

## 2021-01-12 MED ORDER — SODIUM CHLORIDE 0.9 % IV SOLN
2.0000 g | INTRAVENOUS | Status: DC
  Administered 2021-01-12 – 2021-01-13 (×2): 2 g via INTRAVENOUS
  Filled 2021-01-12 (×2): qty 20

## 2021-01-12 NOTE — Progress Notes (Signed)
PROGRESS NOTE    Heather Espinoza  NLG:[id number] DOB: [birth year] DOA: 01/10/2021 PCP: London Pepper, MD    Chief Complaint  Patient presents with   Fever    lethargic    Brief Narrative:  H/o COPD, hypothyroidism, hyperlipidemia, HTN, type 2 diabetes mellitus who presents to the emergency department  Due to fever , confusion  Subjective:  Feeling better, denies urinary symptoms Reports chronic back pain h/o back surgery No fever, confusion has almost resolved  Assessment & Plan:   Principal Problem:   Severe sepsis ([place]) Active Problems:   Essential hypertension, benign   Acquired hypothyroidism   GERD (gastroesophageal reflux disease)   COPD (chronic obstructive pulmonary disease) ([place])   CKD (chronic kidney disease), stage III (HCC)   Altered mental status   Leukocytosis   Hyperglycemia due to diabetes mellitus (HCC)   Lactic acidosis   Anemia of chronic disease   Chronic back pain   Thrombocytosis   Mixed hyperlipidemia   Hypomagnesemia   Hypophosphatemia   Dehydration   Sepsis presents on admission with acute metabolic encephalopathy, UTI, E. coli bacteremia, left side kidney stone -Improving on Rocephin, follow-up on final culture result -Urology consulted, will follow recommendation  AKI on CKD IIIa, anemia of chronic disease Improving, renal dosing meds  Hypomagnesemia Replaced, improved  Noninsulin-dependent type 2 diabetes, controlled Hold metformin On SSI  Hyperlipidemia, continue statin  Hypothyroidism Continue Synthroid  H/o lumbar surgery, his chronic low back pain  COPD -Chart history of alpha-1 antitrypsin deficiency, no active bronchospasm on exam. -Does not use oxygen at baseline.      Body mass index is 24.2 kg/m.Marland Kitchen       Unresulted Labs (From admission, onward)     Start     Ordered   01/18/21 0500  Creatinine, serum  (enoxaparin (LOVENOX)    CrCl < 30 ml/min)  Weekly,   R     Comments: while on enoxaparin therapy.     01/11/21 0536   01/13/21 0500  CBC with Differential/Platelet  Tomorrow morning,   R        01/12/21 0839   01/13/21 0814  Basic metabolic panel  Tomorrow morning,   R        01/12/21 0839   01/13/21 0500  Magnesium  Tomorrow morning,   STAT        01/12/21 0839   01/13/21 0500  Vitamin B12  Tomorrow morning,   R        01/12/21 0841   01/13/21 0500  Folate, serum, performed at [facility] lab  Tomorrow morning,   R        01/12/21 0841   01/13/21 0500  Iron and TIBC  Tomorrow morning,   R        01/12/21 0841   01/13/21 0500  Ferritin  Tomorrow morning,   R        01/12/21 0841              DVT prophylaxis: enoxaparin (LOVENOX) injection 30 mg Start: 01/11/21 1000 SCDs Start: 01/11/21 0537   Code Status: Family Communication: husband updated over the phone on 10/27 Disposition:   Status is: Inpatient   Dispo: The patient is from: home              Anticipated d/c is to: Home with home health              Anticipated d/c date is: 24 to 48 hours pending culture result, urology consult  Consultants:  Urology Dr Alyson Ingles  Procedures:  none  Antimicrobials:    Anti-infectives (From admission, onward)    Start     Dose/Rate Route Frequency Ordered Stop   01/12/21 1930  cefTRIAXone (ROCEPHIN) 2 g in sodium chloride 0.9 % 100 mL IVPB  Status:  Discontinued        2 g 200 mL/hr over 30 Minutes Intravenous Every 24 hours 01/11/21 1841 01/12/21 0219   01/12/21 0300  cefTRIAXone (ROCEPHIN) 2 g in sodium chloride 0.9 % 100 mL IVPB        2 g 200 mL/hr over 30 Minutes Intravenous Every 24 hours 01/12/21 0219     01/12/21 0000  cefTRIAXone (ROCEPHIN) 1 g in sodium chloride 0.9 % 100 mL IVPB  Status:  Discontinued        1 g 200 mL/hr over 30 Minutes Intravenous Every 24 hours 01/11/21 0636 01/11/21 1841   01/11/21 0000  cefTRIAXone (ROCEPHIN) 1 g in sodium chloride 0.9 % 100 mL IVPB        1 g 200 mL/hr over 30 Minutes Intravenous  Once 01/10/21 2354  01/11/21 0148          Objective: Vitals:   01/11/21 2330 01/12/21 0000 01/12/21 0305 01/12/21 0812  BP: (!) 125/53 (!) 137/53 127/72 (!) 110/54  Pulse: 84 80 90 66  Resp:  18 20 18   Temp:   99.5 F (37.5 C) 98.1 F (36.7 C)  TempSrc:   Oral Oral  SpO2: 95% 93% 100% 97%  Weight:   58.1 kg   Height:   5\' 1"  (1.549 m)     Intake/Output Summary (Last 24 hours) at 01/12/2021 1139 Last data filed at 01/12/2021 1031 Gross per 24 hour  Intake 2227.8 ml  Output --  Net 2227.8 ml   Filed Weights   01/10/21 2205 01/12/21 0305  Weight: 54 kg 58.1 kg    Examination:  General exam: alert, awake, communicative,calm, NAD Respiratory system: Clear to auscultation. Respiratory effort normal. Cardiovascular system:  RRR.  Gastrointestinal system: Abdomen is nondistended, soft and nontender.  Normal bowel sounds heard. Central nervous system: Alert and oriented. No focal neurological deficits. Extremities:  no edema Skin: No rashes, lesions or ulcers Psychiatry: Judgement and insight appear normal. Mood & affect appropriate.     Data Reviewed: I have personally reviewed following labs and imaging studies  CBC: Recent Labs  Lab 01/10/21 2331 01/12/21 0532  WBC 27.4* 15.7*  NEUTROABS 23.8*  --   HGB 11.4* 8.2*  HCT 34.2* 25.4*  MCV 97.7 101.2*  PLT 526* 294    Basic Metabolic Panel: Recent Labs  Lab 01/10/21 2331 01/11/21 0635 01/11/21 0833 01/12/21 0532  NA 137  --  135 136  K 4.7  --  3.9 4.0  CL 102  --  104 108  CO2 23  --  23 20*  GLUCOSE 143*  --  134* 131*  BUN 16  --  15 11  CREATININE 1.50*  --  1.28* 1.05*  CALCIUM 9.0  --  8.3* 7.7*  MG  --  1.2*  --  1.7  PHOS  --  2.4*  --   --     GFR: Estimated Creatinine Clearance: 39.1 mL/min (A) (by C-G formula based on SCr of 1.05 mg/dL (H)).  Liver Function Tests: Recent Labs  Lab 01/10/21 2331 01/12/21 0532  AST 20 13*  ALT 13 8  ALKPHOS 53 42  BILITOT 0.9 0.5  PROT 7.5 5.7*  ALBUMIN 3.6  2.5*    CBG: Recent Labs  Lab 01/11/21 2049 01/12/21 0009 01/12/21 0311 01/12/21 0752 01/12/21 1130  GLUCAP 110* 133* 113* 124* 128*     Recent Results (from the past 240 hour(s))  Resp Panel by RT-PCR (Flu A&B, Covid) Nasopharyngeal Swab     Status: None   Collection Time: 01/10/21 11:54 PM   Specimen: Nasopharyngeal Swab; Nasopharyngeal(NP) swabs in vial transport medium  Result Value Ref Range Status   SARS Coronavirus 2 by RT PCR NEGATIVE NEGATIVE Final    Comment: (NOTE) SARS-CoV-2 target nucleic acids are NOT DETECTED.  The SARS-CoV-2 RNA is generally detectable in upper respiratory specimens during the acute phase of infection. The lowest concentration of SARS-CoV-2 viral copies this assay can detect is 138 copies/mL. A negative result does not preclude SARS-Cov-2 infection and should not be used as the sole basis for treatment or other patient management decisions. A negative result may occur with  improper specimen collection/handling, submission of specimen other than nasopharyngeal swab, presence of viral mutation(s) within the areas targeted by this assay, and inadequate number of viral copies(<138 copies/mL). A negative result must be combined with clinical observations, patient history, and epidemiological information. The expected result is Negative.  Fact Sheet for Patients:  EntrepreneurPulse.com.au  Fact Sheet for Healthcare Providers:  IncredibleEmployment.be  This test is no t yet approved or cleared by the [place] FDA and  has been authorized for detection and/or diagnosis of SARS-CoV-2 by FDA under an Emergency Use Authorization (EUA). This EUA will remain  in effect (meaning this test can be used) for the duration of the COVID-19 declaration under Section 564(b)(1) of the Act, 21 U.S.C.section 360bbb-3(b)(1), unless the authorization is terminated  or revoked sooner.       Influenza A by PCR NEGATIVE  NEGATIVE Final   Influenza B by PCR NEGATIVE NEGATIVE Final    Comment: (NOTE) The Xpert Xpress SARS-CoV-2/FLU/RSV plus assay is intended as an aid in the diagnosis of influenza from Nasopharyngeal swab specimens and should not be used as a sole basis for treatment. Nasal washings and aspirates are unacceptable for Xpert Xpress SARS-CoV-2/FLU/RSV testing.  Fact Sheet for Patients: EntrepreneurPulse.com.au  Fact Sheet for Healthcare Providers: IncredibleEmployment.be  This test is not yet approved or cleared by the [place] FDA and has been authorized for detection and/or diagnosis of SARS-CoV-2 by FDA under an Emergency Use Authorization (EUA). This EUA will remain in effect (meaning this test can be used) for the duration of the COVID-19 declaration under Section 564(b)(1) of the Act, 21 U.S.C. section 360bbb-3(b)(1), unless the authorization is terminated or revoked.  Performed at [facility], [address], [place], [place] [address]   Blood Culture (routine x 2)     Status: None (Preliminary result)   Collection Time: 01/10/21 11:54 PM   Specimen: BLOOD  Result Value Ref Range Status   Specimen Description BLOOD BLOOD RIGHT ARM  Final   Special Requests   Final    BOTTLES DRAWN AEROBIC AND ANAEROBIC Blood Culture adequate volume   Culture  Setup Time   Final    GRAM NEGATIVE RODS ANAEROBIC BOTTLE ONLY CRITICAL VALUE NOTED.  VALUE IS CONSISTENT WITH PREVIOUSLY REPORTED AND CALLED VALUE. Performed at [facility], [address], [place], [place] [address]    Culture PENDING  Incomplete   Report Status PENDING  Incomplete  Blood Culture (routine x 2)     Status: Abnormal (Preliminary result)   Collection Time: 01/11/21 12:13 AM  Specimen: BLOOD  Result Value Ref Range Status   Specimen Description   Final    BLOOD BLOOD RIGHT FOREARM Performed at [facility], [address], [place], [place] [address]    Special Requests    Final    BOTTLES DRAWN AEROBIC AND ANAEROBIC Blood Culture adequate volume Performed at [facility], [address], [place], [place] [address]    Culture  Setup Time   Final    GRAM NEGATIVE RODS ANAEROBIC BOTTLE ONLY Gram Stain Report Called to,Read Back By and Verified With: WOODS,R AT 8299 ON 10.26.22 BY RUCINSKI,B CRITICAL RESULT CALLED TO, READ BACK BY AND VERIFIED WITH: [place] RN 01/12/2021 @0116  BY JW Performed at [facility], [place] [address], [place], [place] [address]    Culture ESCHERICHIA COLI (A)  Final   Report Status PENDING  Incomplete  Blood Culture ID Panel (Reflexed)     Status: Abnormal   Collection Time: 01/11/21 12:13 AM  Result Value Ref Range Status   Enterococcus faecalis NOT DETECTED NOT DETECTED Final   Enterococcus Faecium NOT DETECTED NOT DETECTED Final   Listeria monocytogenes NOT DETECTED NOT DETECTED Final   Staphylococcus species NOT DETECTED NOT DETECTED Final   Staphylococcus aureus (BCID) NOT DETECTED NOT DETECTED Final   Staphylococcus epidermidis NOT DETECTED NOT DETECTED Final   Staphylococcus lugdunensis NOT DETECTED NOT DETECTED Final   Streptococcus species NOT DETECTED NOT DETECTED Final   Streptococcus agalactiae NOT DETECTED NOT DETECTED Final   Streptococcus pneumoniae NOT DETECTED NOT DETECTED Final   Streptococcus pyogenes NOT DETECTED NOT DETECTED Final   A.calcoaceticus-baumannii NOT DETECTED NOT DETECTED Final   Bacteroides fragilis NOT DETECTED NOT DETECTED Final   Enterobacterales DETECTED (A) NOT DETECTED Final    Comment: Enterobacterales represent a large order of gram negative bacteria, not a single organism. CRITICAL RESULT CALLED TO, READ BACK BY AND VERIFIED WITH: ANGIE COE RN 01/12/2021 @0116  BY JW    Enterobacter cloacae complex NOT DETECTED NOT DETECTED Final   Escherichia coli DETECTED (A) NOT DETECTED Final    Comment: CRITICAL RESULT CALLED TO, READ BACK BY AND VERIFIED WITH: ANGIE COE RN 01/12/2021 @0116   BY JW    Klebsiella aerogenes NOT DETECTED NOT DETECTED Final   Klebsiella oxytoca NOT DETECTED NOT DETECTED Final   Klebsiella pneumoniae NOT DETECTED NOT DETECTED Final   Proteus species NOT DETECTED NOT DETECTED Final   Salmonella species NOT DETECTED NOT DETECTED Final   Serratia marcescens NOT DETECTED NOT DETECTED Final   Haemophilus influenzae NOT DETECTED NOT DETECTED Final   Neisseria meningitidis NOT DETECTED NOT DETECTED Final   Pseudomonas aeruginosa NOT DETECTED NOT DETECTED Final   Stenotrophomonas maltophilia NOT DETECTED NOT DETECTED Final   Candida albicans NOT DETECTED NOT DETECTED Final   Candida auris NOT DETECTED NOT DETECTED Final   Candida glabrata NOT DETECTED NOT DETECTED Final   Candida krusei NOT DETECTED NOT DETECTED Final   Candida parapsilosis NOT DETECTED NOT DETECTED Final   Candida tropicalis NOT DETECTED NOT DETECTED Final   Cryptococcus neoformans/gattii NOT DETECTED NOT DETECTED Final   CTX-M ESBL NOT DETECTED NOT DETECTED Final   Carbapenem resistance IMP NOT DETECTED NOT DETECTED Final   Carbapenem resistance KPC NOT DETECTED NOT DETECTED Final   Carbapenem resistance NDM NOT DETECTED NOT DETECTED Final   Carbapenem resist OXA 48 LIKE NOT DETECTED NOT DETECTED Final   Carbapenem resistance VIM NOT DETECTED NOT DETECTED Final    Comment: Performed at [facility], [address], [place], [place] [address]  Urine Culture     Status: Abnormal (Preliminary result)   Collection Time: 01/11/21  1:48 AM   Specimen: In/Out Cath Urine  Result Value Ref Range Status   Specimen Description   Final    IN/OUT CATH URINE Performed at [facility], [address], [place], [place] [address]    Special Requests   Final    NONE Performed at [facility], [address] [place], [place] [address]    Culture (A)  Final    2,000 COLONIES/mL ESCHERICHIA COLI SUSCEPTIBILITIES TO FOLLOW Performed at [facility], [place] [address], [place], [place]  [address]    Report Status PENDING  Incomplete  MRSA Next Gen by PCR, Nasal     Status: None   Collection Time: 01/12/21  3:40 AM   Specimen: Nasal Mucosa; Nasal Swab  Result Value Ref Range Status   MRSA by PCR Next Gen NOT DETECTED NOT DETECTED Final    Comment: (NOTE) The GeneXpert MRSA Assay (FDA approved for NASAL specimens only), is one component of a comprehensive MRSA colonization surveillance program. It is not intended to diagnose MRSA infection nor to guide or monitor treatment for MRSA infections. Test performance is not FDA approved in patients less than 23 years old. Performed at [facility], [address] [place], [place] [address]          Radiology Studies: DG Chest 2 View  Result Date: 01/10/2021 CLINICAL DATA:  Back pain and lethargy. EXAM: CHEST - 2 VIEW COMPARISON:  Chest radiograph dated 11/25/2020. FINDINGS: The lungs are clear. There is no pleural effusion pneumothorax. The cardiac silhouette is within limits. No acute osseous pathology. Stimulator over the anterior chest wall. IMPRESSION: No active cardiopulmonary disease. Electronically Signed   By: Anner Crete M.D.   On: 01/10/2021 22:57   CT HEAD WO CONTRAST (5MM)  Result Date: 01/11/2021 CLINICAL DATA:  Altered mental status. EXAM: CT HEAD WITHOUT CONTRAST TECHNIQUE: Contiguous axial images were obtained from the base of the skull through the vertex without intravenous contrast. COMPARISON:  None. FINDINGS: Brain: There is mild cerebral atrophy with widening of the extra-axial spaces and ventricular dilatation. There are areas of decreased attenuation within the white matter tracts of the supratentorial brain, consistent with microvascular disease changes. Mild, bilateral basal ganglia calcification is seen. Vascular: No hyperdense vessels are identified. Skull: Normal. Negative for fracture or focal lesion. Sinuses/Orbits: There is mild to moderate severity left maxillary sinus mucosal thickening.  Other: None. IMPRESSION: No acute intracranial abnormality. Electronically Signed   By: Virgina Norfolk M.D.   On: 01/11/2021 00:53   CT RENAL STONE STUDY  Result Date: 01/11/2021 CLINICAL DATA:  Flank pain.  Concern for kidney stone. EXAM: CT ABDOMEN AND PELVIS WITHOUT CONTRAST TECHNIQUE: Multidetector CT imaging of the abdomen and pelvis was performed following the standard protocol without IV contrast. COMPARISON:  CT abdomen pelvis dated 07/30/2020. FINDINGS: Evaluation of this exam is limited in the absence of intravenous contrast. Lower chest: The visualized lung bases are clear. No intra-abdominal free air or free fluid. Hepatobiliary: The liver is unremarkable. No intrahepatic biliary dilatation. Cholecystectomy. Pancreas: Unremarkable. No pancreatic ductal dilatation or surrounding inflammatory changes. Spleen: Several small scattered calcified splenic granuloma. Adrenals/Urinary Tract: The adrenal glands are unremarkable. There is a 1 cm stone in the inferior pole of the left kidney with mild fullness of the inferior pole collecting systems. There is mild left perinephric stranding. The right kidney is unremarkable. The  visualized ureters and urinary bladder appear unremarkable. Stomach/Bowel: There is sigmoid diverticulosis without active inflammatory changes. Moderate stool throughout the colon. There is no bowel obstruction or active inflammation. Appendectomy. Vascular/Lymphatic: Mild aortoiliac atherosclerotic disease. The IVC is unremarkable. No portal venous gas. There is no adenopathy. Reproductive: Hysterectomy.  No adnexal masses. Other: None Musculoskeletal: Lower lumbar fusion hardware. No acute osseous pathology. IMPRESSION: 1. A 1 cm left inferior pole renal stone with mild fullness of the inferior pole collecting systems and mild left perinephric stranding. Correlation with urinalysis recommended to exclude UTI. 2. Sigmoid diverticulosis. No bowel obstruction. 3. Aortic  Atherosclerosis (ICD10-I70.0). Electronically Signed   By: Anner Crete M.D.   On: 01/11/2021 00:46        Scheduled Meds:  enoxaparin (LOVENOX) injection  30 mg Subcutaneous Q24H   famotidine  20 mg Oral QHS   insulin aspart  0-9 Units Subcutaneous Q4H   levothyroxine  37.5 mcg Oral QAC breakfast   midodrine  2.5 mg Oral TID WC   pantoprazole  40 mg Oral Daily   simvastatin  20 mg Oral QPM   traZODone  100 mg Oral QHS   Continuous Infusions:  cefTRIAXone (ROCEPHIN)  IV 2 g (01/12/21 0244)     LOS: 1 day   Time spent: 1mins Greater than 50% of this time was spent in counseling, explanation of diagnosis, planning of further management, and coordination of care.   Voice Recognition Viviann Spare dictation system was used to create this note, attempts have been made to correct errors. Please contact the author with questions and/or clarifications.   Florencia Reasons, MD PhD FACP [facility]  Available via Epic secure chat 7am-7pm for nonurgent issues Please page for urgent issues To page the attending provider between 7A-7P or the covering provider during after hours 7P-7A, please log into the web site [email or web address] and access using universal [place] password for that web site. If you do not have the password, please call the hospital operator.    01/12/2021, 11:39 AM

## 2021-01-12 NOTE — ED Notes (Signed)
ED TO INPATIENT HANDOFF REPORT  ED Nurse Name and Phone #: Janace Hoard [phone]  S Name/Age/Gender Heather Espinoza 75 y.o. female Room/Bed: APA18/APA18  Code Status   Code Status: Full Code  Home/SNF/Other  Patient oriented to: self, place, time, and situation Is this baseline? Yes   Triage Complete: Triage complete  Chief Complaint Sepsis secondary to UTI ([place]) [A41.9, N39.0]  Triage Note Pt presented to ED with spouse, pt says she has chronic back pain since she had surgery a year ago. Pt spouse says he drives trucks and got home and noticed pt was weaker than usual and thinks she may be dehydrated. Pt also c/o nausea   Allergies Allergies  Allergen Reactions   Flagyl [Metronidazole] Shortness Of Breath and Swelling   Lyrica [Pregabalin] Shortness Of Breath   Adhesive [Tape] Other (See Comments)    Takes off patient's skin   Aspirin Other (See Comments)    Stomach bleed   Latex Itching and Rash   Phenergan [Promethazine Hcl] Other (See Comments)    Caused grogginess, altered mental status   Prednisone Anxiety   Sulfa Antibiotics Nausea Only   Tetanus Toxoids Swelling    Arm Area    Level of Care/Admitting Diagnosis ED Disposition     ED Disposition  Admit   Condition  --   Comment  Hospital Area: [facility] [[id number]]  Level of Care: Med-Surg [16]  Covid Evaluation: Confirmed COVID Negative  Diagnosis: Sepsis secondary to UTI [facility]) [[id number]]  Admitting Physician: Bernadette Hoit [[id number]]  Attending Physician: Bernadette Hoit [[id number]]  Estimated length of stay: past midnight tomorrow  Certification:: I certify this patient will need inpatient services for at least 2 midnights          B Medical/Surgery History Past Medical History:  Diagnosis Date   Anemia    Arthritis    COPD (chronic obstructive pulmonary disease) ([place])    COVID-19 11/11/2020   Diabetes ([place])    Diverticulitis    Diverticulosis    Dyspnea    Fibromyalgia    High  cholesterol    History of kidney stones    Hypertension    Hypothyroid    IBS (irritable bowel syndrome)    Osteopenia    Pneumonia    Restless leg    Sleep apnea    Doesn't use CPAP.     Tachycardia    per pt/fim   Tremor, essential 01/07/2018   Past Surgical History:  Procedure Laterality Date   ABDOMINAL HYSTERECTOMY     APPENDECTOMY     BACK SURGERY     BIOPSY  12/25/2016   Procedure: BIOPSY;  Surgeon: Rogene Houston, MD;  [facility];  Service: Endoscopy;;  gastric    BREAST LUMPECTOMY Right 03/20/1999   CATARACT EXTRACTION Bilateral 03/19/2014   CHOLECYSTECTOMY     COLONOSCOPY N/A 12/17/2012   Procedure: COLONOSCOPY;  Surgeon: Rogene Houston, MD;  [facility];  Service: Endoscopy;  Laterality: N/A;  215   COLONOSCOPY WITH PROPOFOL N/A 07/24/2019   Procedure: COLONOSCOPY WITH PROPOFOL;  Surgeon: Rogene Houston, MD;  [facility];  Service: Endoscopy;  Laterality: N/A;  730   complete hysterectomy     DRUG INDUCED ENDOSCOPY N/A 09/02/2020   Procedure: DRUG INDUCED ENDOSCOPY;  Surgeon: Jerrell Belfast, MD;  Location: [place];  Service: ENT;  Laterality: N/A;   ESOPHAGEAL DILATION N/A 12/25/2016   Procedure: ESOPHAGEAL DILATION;  Surgeon: Rogene Houston, MD;  [facility];  Service: Endoscopy;  Laterality: N/A;   ESOPHAGEAL DILATION N/A 07/24/2019   Procedure: ESOPHAGEAL DILATION;  Surgeon: Rogene Houston, MD;  [facility];  Service: Endoscopy;  Laterality: N/A;   ESOPHAGOGASTRODUODENOSCOPY N/A 12/25/2016   Procedure: ESOPHAGOGASTRODUODENOSCOPY (EGD);  Surgeon: Rogene Houston, MD;  [facility];  Service: Endoscopy;  Laterality: N/A;  730   ESOPHAGOGASTRODUODENOSCOPY (EGD) WITH PROPOFOL N/A 07/24/2019   Procedure: ESOPHAGOGASTRODUODENOSCOPY (EGD) WITH PROPOFOL;  Surgeon: Rogene Houston, MD;  [facility];  Service: Endoscopy;  Laterality: N/A;   Heel tumor removed      IMPLANTATION OF HYPOGLOSSAL NERVE STIMULATOR Right 11/25/2020   Procedure: IMPLANTATION OF HYPOGLOSSAL NERVE STIMULATOR;  Surgeon: Jerrell Belfast, MD;  Location: [place];  Service: ENT;  Laterality: Right;   POLYPECTOMY  07/24/2019   Procedure: POLYPECTOMY;  Surgeon: Rogene Houston, MD;  [facility];  Service: Endoscopy;;   rt elbow surgery     TONSILLECTOMY       A IV Location/Drains/Wounds Patient Lines/Drains/Airways Status     Active Line/Drains/Airways     Name Placement date Placement time Site Days   Peripheral IV 01/11/21 20 G Anterior;Right;Upper Arm 01/11/21  0004  Arm  1   Peripheral IV 01/11/21 20 G Posterior;Right Forearm 01/11/21  0022  Forearm  1   External Urinary Catheter 01/11/21  0029  --  1   Incision (Closed) 11/25/20 Chest Right 11/25/20  1158  -- 48   Incision (Closed) 11/25/20 Face Other (Comment) 11/25/20  1158  -- 48            Intake/Output Last 24 hours  Intake/Output Summary (Last 24 hours) at 01/12/2021 0110 Last data filed at 01/11/2021 1627 Gross per 24 hour  Intake 1553.73 ml  Output 300 ml  Net 1253.73 ml    Labs/Imaging Results for orders placed or performed during the hospital encounter of 01/10/21 (from the past 48 hour(s))  Comprehensive metabolic panel     Status: Abnormal   Collection Time: 01/10/21 11:31 PM  Result Value Ref Range   Sodium 137 135 - 145 mmol/L   Potassium 4.7 3.5 - 5.1 mmol/L   Chloride 102 98 - 111 mmol/L   CO2 23 22 - 32 mmol/L   Glucose, Bld 143 (H) 70 - 99 mg/dL    Comment: Glucose reference range applies only to samples taken after fasting for at least 8 hours.   BUN 16 8 - 23 mg/dL   Creatinine, Ser 1.50 (H) 0.44 - 1.00 mg/dL   Calcium 9.0 8.9 - 10.3 mg/dL   Total Protein 7.5 6.5 - 8.1 g/dL   Albumin 3.6 3.5 - 5.0 g/dL   AST 20 15 - 41 U/L   ALT 13 0 - 44 U/L   Alkaline Phosphatase 53 38 - 126 U/L   Total Bilirubin 0.9 0.3 - 1.2 mg/dL   GFR, Estimated 37 (L) >60 mL/min    Comment:  (NOTE) Calculated using the CKD-EPI Creatinine Equation (2021)    Anion gap 12 5 - 15    Comment: Performed at [facility], [address], [place], [place] [address]  CBC with Differential     Status: Abnormal   Collection Time: 01/10/21 11:31 PM  Result Value Ref Range   WBC 27.4 (H) 4.0 - 10.5 K/uL   RBC 3.50 (L) 3.87 - 5.11 MIL/uL   Hemoglobin 11.4 (L) 12.0 - 15.0 g/dL   HCT 34.2 (L) 36.0 -  46.0 %   MCV 97.7 80.0 - 100.0 fL   MCH 32.6 26.0 - 34.0 pg   MCHC 33.3 30.0 - 36.0 g/dL   RDW 13.6 11.5 - 15.5 %   Platelets 526 (H) 150 - 400 K/uL   nRBC 0.0 0.0 - 0.2 %   Neutrophils Relative % 85 %   Neutro Abs 23.8 (H) 1.7 - 7.7 K/uL   Band Neutrophils 2 %   Lymphocytes Relative 7 %   Lymphs Abs 1.9 0.7 - 4.0 K/uL   Monocytes Relative 6 %   Monocytes Absolute 1.6 (H) 0.1 - 1.0 K/uL   Eosinophils Relative 0 %   Eosinophils Absolute 0.0 0.0 - 0.5 K/uL   Basophils Relative 0 %   Basophils Absolute 0.0 0.0 - 0.1 K/uL   WBC Morphology MILD LEFT SHIFT (1-5% METAS, OCC MYELO, OCC BANDS)     Comment: TOXIC GRANULATION Performed at [facility], [address], [place], [place] [address]   Protime-INR     Status: None   Collection Time: 01/10/21 11:31 PM  Result Value Ref Range   Prothrombin Time 14.6 11.4 - 15.2 seconds   INR 1.1 0.8 - 1.2    Comment: (NOTE) INR goal varies based on device and disease states. Performed at [facility], [address] [place], [place] [address]   APTT     Status: Abnormal   Collection Time: 01/10/21 11:31 PM  Result Value Ref Range   aPTT 37 (H) 24 - 36 seconds    Comment:        IF BASELINE aPTT IS ELEVATED, SUGGEST PATIENT RISK ASSESSMENT BE USED TO DETERMINE APPROPRIATE ANTICOAGULANT THERAPY. Performed at [facility], [address], [place], [place] [address]   Resp Panel by RT-PCR (Flu A&B, Covid) Nasopharyngeal Swab     Status: None   Collection Time: 01/10/21 11:54 PM   Specimen: Nasopharyngeal Swab; Nasopharyngeal(NP) swabs in vial  transport medium  Result Value Ref Range   SARS Coronavirus 2 by RT PCR NEGATIVE NEGATIVE    Comment: (NOTE) SARS-CoV-2 target nucleic acids are NOT DETECTED.  The SARS-CoV-2 RNA is generally detectable in upper respiratory specimens during the acute phase of infection. The lowest concentration of SARS-CoV-2 viral copies this assay can detect is 138 copies/mL. A negative result does not preclude SARS-Cov-2 infection and should not be used as the sole basis for treatment or other patient management decisions. A negative result may occur with  improper specimen collection/handling, submission of specimen other than nasopharyngeal swab, presence of viral mutation(s) within the areas targeted by this assay, and inadequate number of viral copies(<138 copies/mL). A negative result must be combined with clinical observations, patient history, and epidemiological information. The expected result is Negative.  Fact Sheet for Patients:  EntrepreneurPulse.com.au  Fact Sheet for Healthcare Providers:  IncredibleEmployment.be  This test is no t yet approved or cleared by the [place] FDA and  has been authorized for detection and/or diagnosis of SARS-CoV-2 by FDA under an Emergency Use Authorization (EUA). This EUA will remain  in effect (meaning this test can be used) for the duration of the COVID-19 declaration under Section 564(b)(1) of the Act, 21 U.S.C.section 360bbb-3(b)(1), unless the authorization is terminated  or revoked sooner.       Influenza A by PCR NEGATIVE NEGATIVE   Influenza B by PCR NEGATIVE NEGATIVE    Comment: (NOTE) The Xpert Xpress SARS-CoV-2/FLU/RSV plus assay is intended as an aid in the diagnosis of influenza from Nasopharyngeal swab specimens  and should not be used as a sole basis for treatment. Nasal washings and aspirates are unacceptable for Xpert Xpress SARS-CoV-2/FLU/RSV testing.  Fact Sheet for  Patients: EntrepreneurPulse.com.au  Fact Sheet for Healthcare Providers: IncredibleEmployment.be  This test is not yet approved or cleared by the [place] FDA and has been authorized for detection and/or diagnosis of SARS-CoV-2 by FDA under an Emergency Use Authorization (EUA). This EUA will remain in effect (meaning this test can be used) for the duration of the COVID-19 declaration under Section 564(b)(1) of the Act, 21 U.S.C. section 360bbb-3(b)(1), unless the authorization is terminated or revoked.  Performed at [facility], [address], [place], [place] [address]   Lactic acid, plasma     Status: None   Collection Time: 01/10/21 11:54 PM  Result Value Ref Range   Lactic Acid, Venous 1.7 0.5 - 1.9 mmol/L    Comment: Performed at [facility], [address] [place], [place] [address]  Blood Culture (routine x 2)     Status: None (Preliminary result)   Collection Time: 01/10/21 11:54 PM   Specimen: BLOOD  Result Value Ref Range   Specimen Description BLOOD BLOOD RIGHT ARM    Special Requests      BOTTLES DRAWN AEROBIC AND ANAEROBIC Blood Culture adequate volume   Culture  Setup Time PENDING    Culture      NO GROWTH < 12 HOURS Performed at [facility], [address], [place], [place] [address]    Report Status PENDING   Blood Culture (routine x 2)     Status: None (Preliminary result)   Collection Time: 01/11/21 12:13 AM   Specimen: BLOOD  Result Value Ref Range   Specimen Description      BLOOD BLOOD RIGHT FOREARM Performed at [facility], [address], [place], [place] [address]    Special Requests      BOTTLES DRAWN AEROBIC AND ANAEROBIC Blood Culture adequate volume Performed at [facility], [address], [place], [place] [address]    Culture  Setup Time      GRAM NEGATIVE RODS ANAEROBIC BOTTLE ONLY Gram Stain Report Called to,Read Back By and Verified With: WOODS,R AT 2595 ON 10.26.22 BY RUCINSKI,B Organism ID  to follow Performed at [facility] [address], [place], [place] [address]    Culture PENDING    Report Status PENDING   Urinalysis, Routine w reflex microscopic Urine, In & Out Cath     Status: Abnormal   Collection Time: 01/11/21  1:48 AM  Result Value Ref Range   Color, Urine YELLOW YELLOW   APPearance CLEAR CLEAR   Specific Gravity, Urine 1.005 1.005 - 1.030   pH 7.0 5.0 - 8.0   Glucose, UA NEGATIVE NEGATIVE mg/dL   Hgb urine dipstick SMALL (A) NEGATIVE   Bilirubin Urine NEGATIVE NEGATIVE   Ketones, ur NEGATIVE NEGATIVE mg/dL   Protein, ur NEGATIVE NEGATIVE mg/dL   Nitrite NEGATIVE NEGATIVE   Leukocytes,Ua LARGE (A) NEGATIVE   RBC / HPF 0-5 0 - 5 RBC/hpf   WBC, UA >50 (H) 0 - 5 WBC/hpf   Bacteria, UA RARE (A) NONE SEEN   Squamous Epithelial / LPF 0-5 0 - 5    Comment: Performed at [facility], [address] [place], [place] [address]  Lactic acid, plasma     Status: Abnormal   Collection Time: 01/11/21  1:57 AM  Result Value Ref Range   Lactic Acid, Venous 2.3 (HH) 0.5 - 1.9 mmol/L  Comment: CRITICAL RESULT CALLED TO, READ BACK BY AND VERIFIED WITH: Belton,[email or web address]  by Zigmund Daniel, b 10.26.22 Performed at [facility], [address] [place], [place] [address]   Lactic acid, plasma     Status: Abnormal   Collection Time: 01/11/21  5:38 AM  Result Value Ref Range   Lactic Acid, Venous 2.3 (HH) 0.5 - 1.9 mmol/L    Comment: CRITICAL VALUE NOTED.  VALUE IS CONSISTENT WITH PREVIOUSLY REPORTED AND CALLED VALUE. Performed at [facility], [address], [place], [place] [address]   Magnesium     Status: Abnormal   Collection Time: 01/11/21  6:35 AM  Result Value Ref Range   Magnesium 1.2 (L) 1.7 - 2.4 mg/dL    Comment: Performed at [facility], [address], [place], [place] [address]  Phosphorus     Status: Abnormal   Collection Time: 01/11/21  6:35 AM  Result Value Ref Range   Phosphorus 2.4 (L) 2.5 - 4.6 mg/dL    Comment: Performed at [facility],  [address], [place], [place] [address]  Hemoglobin A1c     Status: Abnormal   Collection Time: 01/11/21  6:35 AM  Result Value Ref Range   Hgb A1c MFr Bld 6.0 (H) 4.8 - 5.6 %    Comment: (NOTE) Pre diabetes:          5.7%-6.4%  Diabetes:              >6.4%  Glycemic control for   <7.0% adults with diabetes    Mean Plasma Glucose 125.5 mg/dL    Comment: Performed at [facility] [address], [place], [place] [address]  Lactic acid, plasma     Status: None   Collection Time: 01/11/21  6:38 AM  Result Value Ref Range   Lactic Acid, Venous 1.4 0.5 - 1.9 mmol/L    Comment: Performed at [facility], [address], [place], [place] [address]  Basic metabolic panel     Status: Abnormal   Collection Time: 01/11/21  8:33 AM  Result Value Ref Range   Sodium 135 135 - 145 mmol/L   Potassium 3.9 3.5 - 5.1 mmol/L    Comment: DELTA CHECK NOTED   Chloride 104 98 - 111 mmol/L   CO2 23 22 - 32 mmol/L   Glucose, Bld 134 (H) 70 - 99 mg/dL    Comment: Glucose reference range applies only to samples taken after fasting for at least 8 hours.   BUN 15 8 - 23 mg/dL   Creatinine, Ser 1.28 (H) 0.44 - 1.00 mg/dL   Calcium 8.3 (L) 8.9 - 10.3 mg/dL   GFR, Estimated 44 (L) >60 mL/min    Comment: (NOTE) Calculated using the CKD-EPI Creatinine Equation (2021)    Anion gap 8 5 - 15    Comment: Performed at [facility], [address], [place],  [address]  Blood gas, arterial     Status: Abnormal   Collection Time: 01/11/21  8:34 AM  Result Value Ref Range   FIO2 21.00    pH, Arterial 7.468 (H) 7.350 - 7.450   pCO2 arterial 30.5 (L) 32.0 - 48.0 mmHg   pO2, Arterial 91.2 83.0 - 108.0 mmHg   Bicarbonate 23.5 20.0 - 28.0 mmol/L   Acid-base deficit 1.4 0.0 - 2.0 mmol/L   O2 Saturation 97.0 %   Patient temperature 37.0    Allens test (pass/fail) PASS PASS    Comment: Performed at [facility], [address], [place], [place]  [address]  CBG monitoring, ED     Status: Abnormal   Collection  Time: 01/11/21  8:34 AM  Result Value Ref Range   Glucose-Capillary 132 (H) 70 - 99 mg/dL    Comment: Glucose reference range applies only to samples taken after fasting for at least 8 hours.  CBG monitoring, ED     Status: Abnormal   Collection Time: 01/11/21 12:17 PM  Result Value Ref Range   Glucose-Capillary 171 (H) 70 - 99 mg/dL    Comment: Glucose reference range applies only to samples taken after fasting for at least 8 hours.  CBG monitoring, ED     Status: Abnormal   Collection Time: 01/11/21  5:40 PM  Result Value Ref Range   Glucose-Capillary 142 (H) 70 - 99 mg/dL    Comment: Glucose reference range applies only to samples taken after fasting for at least 8 hours.  CBG monitoring, ED     Status: Abnormal   Collection Time: 01/11/21  8:49 PM  Result Value Ref Range   Glucose-Capillary 110 (H) 70 - 99 mg/dL    Comment: Glucose reference range applies only to samples taken after fasting for at least 8 hours.  CBG monitoring, ED     Status: Abnormal   Collection Time: 01/12/21 12:09 AM  Result Value Ref Range   Glucose-Capillary 133 (H) 70 - 99 mg/dL    Comment: Glucose reference range applies only to samples taken after fasting for at least 8 hours.   DG Chest 2 View  Result Date: 01/10/2021 CLINICAL DATA:  Back pain and lethargy. EXAM: CHEST - 2 VIEW COMPARISON:  Chest radiograph dated 11/25/2020. FINDINGS: The lungs are clear. There is no pleural effusion pneumothorax. The cardiac silhouette is within limits. No acute osseous pathology. Stimulator over the anterior chest wall. IMPRESSION: No active cardiopulmonary disease. Electronically Signed   By: Anner Crete M.D.   On: 01/10/2021 22:57   CT HEAD WO CONTRAST (5MM)  Result Date: 01/11/2021 CLINICAL DATA:  Altered mental status. EXAM: CT HEAD WITHOUT CONTRAST TECHNIQUE: Contiguous axial images were obtained from the base of the skull through the vertex without intravenous contrast. COMPARISON:  None. FINDINGS: Brain:  There is mild cerebral atrophy with widening of the extra-axial spaces and ventricular dilatation. There are areas of decreased attenuation within the white matter tracts of the supratentorial brain, consistent with microvascular disease changes. Mild, bilateral basal ganglia calcification is seen. Vascular: No hyperdense vessels are identified. Skull: Normal. Negative for fracture or focal lesion. Sinuses/Orbits: There is mild to moderate severity left maxillary sinus mucosal thickening. Other: None. IMPRESSION: No acute intracranial abnormality. Electronically Signed   By: Virgina Norfolk M.D.   On: 01/11/2021 00:53   CT RENAL STONE STUDY  Result Date: 01/11/2021 CLINICAL DATA:  Flank pain.  Concern for kidney stone. EXAM: CT ABDOMEN AND PELVIS WITHOUT CONTRAST TECHNIQUE: Multidetector CT imaging of the abdomen and pelvis was performed following the standard protocol without IV contrast. COMPARISON:  CT abdomen pelvis dated 07/30/2020. FINDINGS: Evaluation of this exam is limited in the absence of intravenous contrast. Lower chest: The visualized lung bases are clear. No intra-abdominal free air or free fluid. Hepatobiliary: The liver is unremarkable. No intrahepatic biliary dilatation. Cholecystectomy. Pancreas: Unremarkable. No pancreatic ductal dilatation or surrounding inflammatory changes. Spleen: Several small scattered calcified splenic granuloma. Adrenals/Urinary Tract: The adrenal glands are unremarkable. There is a 1 cm stone in the inferior pole of the left kidney with mild fullness of the inferior pole collecting systems.  There is mild left perinephric stranding. The right kidney is unremarkable. The visualized ureters and urinary bladder appear unremarkable. Stomach/Bowel: There is sigmoid diverticulosis without active inflammatory changes. Moderate stool throughout the colon. There is no bowel obstruction or active inflammation. Appendectomy. Vascular/Lymphatic: Mild aortoiliac atherosclerotic  disease. The IVC is unremarkable. No portal venous gas. There is no adenopathy. Reproductive: Hysterectomy.  No adnexal masses. Other: None Musculoskeletal: Lower lumbar fusion hardware. No acute osseous pathology. IMPRESSION: 1. A 1 cm left inferior pole renal stone with mild fullness of the inferior pole collecting systems and mild left perinephric stranding. Correlation with urinalysis recommended to exclude UTI. 2. Sigmoid diverticulosis. No bowel obstruction. 3. Aortic Atherosclerosis (ICD10-I70.0). Electronically Signed   By: Anner Crete M.D.   On: 01/11/2021 00:46    Pending Labs Unresulted Labs (From admission, onward)     Start     Ordered   01/18/21 0500  Creatinine, serum  (enoxaparin (LOVENOX)    CrCl < 30 ml/min)  Weekly,   R     Comments: while on enoxaparin therapy.    01/11/21 0536   01/12/21 0500  Comprehensive metabolic panel  Tomorrow morning,   R        01/11/21 0536   01/12/21 0500  CBC  Tomorrow morning,   R        01/11/21 0536   01/12/21 0500  Protime-INR  Tomorrow morning,   R        01/11/21 0536   01/12/21 0500  APTT  Tomorrow morning,   R        01/11/21 0536   01/11/21 0818  MRSA Next Gen by PCR, Nasal  Once,   R        01/11/21 0817   01/11/21 0013  Blood Culture ID Panel (Reflexed)  Once,   STAT        01/11/21 0013   01/10/21 2354  Urine Culture  (Septic presentation on arrival (screening labs, nursing and treatment orders for obvious sepsis))  ONCE - STAT,   STAT       Question:  Indication  Answer:  Bacteriuria screening (OB/GYN or Uro)   01/10/21 2354            Vitals/Pain Today's Vitals   01/11/21 2300 01/11/21 2330 01/12/21 0000 01/12/21 0031  BP: 127/60 (!) 125/53 (!) 137/53   Pulse: 85 84 80   Resp:   18   Temp:      TempSrc:      SpO2: 94% 95% 93%   Weight:      Height:      PainSc:    Asleep    Isolation Precautions No active isolations  Medications Medications  enoxaparin (LOVENOX) injection 30 mg (30 mg Subcutaneous  Given 01/11/21 1028)  acetaminophen (TYLENOL) tablet 650 mg (650 mg Oral Given 01/11/21 1631)  insulin aspart (novoLOG) injection 0-9 Units (0 Units Subcutaneous Not Given 01/12/21 0011)  simvastatin (ZOCOR) tablet 20 mg (20 mg Oral Given 01/11/21 1747)  levothyroxine (SYNTHROID) tablet 37.5 mcg (37.5 mcg Oral Given 01/11/21 0955)  pantoprazole (PROTONIX) EC tablet 40 mg (40 mg Oral Given 01/11/21 1028)  famotidine (PEPCID) tablet 20 mg (20 mg Oral Given 01/11/21 2156)  albuterol (PROVENTIL) (2.5 MG/3ML) 0.083% nebulizer solution 2.5 mg (has no administration in time range)  midodrine (PROAMATINE) tablet 2.5 mg (2.5 mg Oral Given 01/11/21 1743)  0.9 %  sodium chloride infusion ( Intravenous New Bag/Given 01/11/21 2233)  cefTRIAXone (ROCEPHIN) 2 g in sodium chloride  0.9 % 100 mL IVPB (has no administration in time range)  ondansetron (ZOFRAN) tablet 4 mg (has no administration in time range)  traZODone (DESYREL) tablet 100 mg (100 mg Oral Given 01/11/21 2155)  lactated ringers bolus 1,000 mL (0 mLs Intravenous Stopped 01/11/21 0201)    And  lactated ringers bolus 500 mL (0 mLs Intravenous Stopped 01/11/21 0144)    And  lactated ringers bolus 250 mL (0 mLs Intravenous Stopped 01/11/21 0144)  cefTRIAXone (ROCEPHIN) 1 g in sodium chloride 0.9 % 100 mL IVPB (0 g Intravenous Stopped 01/11/21 0148)  0.9 %  sodium chloride infusion (0 mLs Intravenous Stopped 01/11/21 0900)  magnesium sulfate IVPB 2 g 50 mL (0 g Intravenous Stopped 01/11/21 1021)  potassium PHOSPHATE 30 mmol in dextrose 5 % 500 mL infusion (0 mmol Intravenous Stopped 01/11/21 1627)    Mobility walks Moderate fall risk   Focused Assessments    R Recommendations: See Admitting Provider Note  Report given to:   Additional Notes:

## 2021-01-12 NOTE — Plan of Care (Signed)
  Problem: Acute Rehab PT Goals(only PT should resolve) Goal: Pt Will Ambulate Flowsheets (Taken 01/12/2021 1703) Pt will Ambulate:  > 125 feet  with supervision Note: With no AD Goal: Pt Will Go Up/Down Stairs Flowsheets (Taken 01/12/2021 1703) Pt will Go Up / Down Stairs:  6-9 stairs  with rail(s)  with minimal assist   5:04 PM, 01/12/21 Jerene Pitch, DPT Physical Therapy with [facility]  [phone] office

## 2021-01-12 NOTE — Evaluation (Signed)
Physical Therapy Evaluation Patient Details Name: Heather Espinoza MRN: [id number] DOB: 1945-04-21 Today's Date: 01/12/2021  History of Present Illness  Heather Espinoza is a 75 y.o. female with medical history significant for GERD, restless leg syndrome, COPD, hypothyroidism, hyperlipidemia, type 2 diabetes mellitus and chronic back pain who presents to the emergency department accompanied by husband.  Patient was unable to provide history, history was obtained from ED physician and husband at bedside.  Per husband, he states that when he returned home yesterday, he noted that patient was weaker than normal and mental status was also altered, so he thought that patient may be dehydrated, so EMS was activated and patient was taken to the ED for further evaluation and management.  There was no report of chest pain, shortness of breath, abdominal, nausea, vomiting, cough or cold symptoms.  Clinical Impression   Patient sitting up in bed and agreeable to therapy with encouragement. Able to perform all bed mobilities and transfers with modified independence. Ambulated in hall with supervision an min guard assist as needed as she deviated to right slightly on occasion. Patient with minimal fatigue but eager to get back in bed.  Patient will continue to benefit from skilled physical therapy in hospital and recommended venue below to increase strength, balance, endurance for safe ADLs and gait.      Recommendations for follow up therapy are one component of a multi-disciplinary discharge planning process, led by the attending physician.  Recommendations may be updated based on patient status, additional functional criteria and insurance authorization.  Follow Up Recommendations Home health PT    Assistance Recommended at Discharge Intermittent Supervision/Assistance  Functional Status Assessment    Equipment Recommendations  None recommended by PT    Recommendations for Other Services       Precautions  / Restrictions        Mobility  Bed Mobility Overal bed mobility: Modified Independent                  Transfers Overall transfer level: Modified independent                      Ambulation/Gait Ambulation/Gait assistance: Modified independent (Device/Increase time);Supervision Gait Distance (Feet): 100 Feet Assistive device: None Gait Pattern/deviations: Step-through pattern;Narrow base of support;Trunk flexed Gait velocity: reduced   General Gait Details: slow movements, no complaints of weakness but deviates to right a few times during walk  Stairs            Wheelchair Mobility    Modified Rankin (Stroke Patients Only)       Balance                                             Pertinent Vitals/Pain Pain Assessment: 0-10 Pain Score: 5  Pain Location: buttocks Pain Descriptors / Indicators: Other (Comment) (raw) Pain Intervention(s): Monitored during session    Home Living Family/patient expects to be discharged to:: Private residence Living Arrangements: Spouse/significant other Available Help at Discharge: Family (friend comes by every other day to check on her when husband is out) Type of Home: House Home Access: Stairs to enter Entrance Stairs-Rails: Can reach both Entrance Stairs-Number of Steps: 8   Home Layout: One level Home Equipment: Conservation officer, nature (2 wheels);BSC;Cane - single point      Prior Function Prior Level of Function : Independent/Modified Independent  Hand Dominance        Extremity/Trunk Assessment        Lower Extremity Assessment Lower Extremity Assessment: Overall WFL for tasks assessed       Communication   Communication: No difficulties  Cognition Arousal/Alertness: Awake/alert                                              General Comments      Exercises General Exercises - Lower Extremity Ankle Circles/Pumps: AROM;Both;15  reps Long Arc Quad: AROM;15 reps;Both Hip Flexion/Marching: AROM;15 reps;Both   Assessment/Plan    PT Assessment Patient needs continued PT services  PT Problem List Decreased strength;Decreased activity tolerance;Decreased balance       PT Treatment Interventions Gait training;Neuromuscular re-education;Therapeutic activities;Patient/family education;Balance training;Therapeutic exercise;Stair training    PT Goals (Current goals can be found in the Care Plan section)  Acute Rehab PT Goals Patient Stated Goal: to gohome PT Goal Formulation: With patient Time For Goal Achievement: 01/26/21 Potential to Achieve Goals: Good    Frequency Min 3X/week   Barriers to discharge        Co-evaluation               AM-PAC PT "6 Clicks" Mobility  Outcome Measure Help needed turning from your back to your side while in a flat bed without using bedrails?: None Help needed moving from lying on your back to sitting on the side of a flat bed without using bedrails?: None Help needed moving to and from a bed to a chair (including a wheelchair)?: None Help needed standing up from a chair using your arms (e.g., wheelchair or bedside chair)?: None Help needed to walk in hospital room?: None Help needed climbing 3-5 steps with a railing? : A Little 6 Click Score: 23    End of Session Equipment Utilized During Treatment: Gait belt Activity Tolerance: Patient tolerated treatment well Patient left: in bed;with call bell/phone within reach;with bed alarm set Nurse Communication: Mobility status PT Visit Diagnosis: Unsteadiness on feet (R26.81);Muscle weakness (generalized) (M62.81)    Time: 2202-5427 PT Time Calculation (min) (ACUTE ONLY): 21 min   Charges:     PT Treatments $Therapeutic Exercise: 8-22 mins        5:04 PM, 01/12/21 Jerene Pitch, DPT Physical Therapy with [facility]  [phone] office

## 2021-01-12 NOTE — Progress Notes (Addendum)
Nutrition Brief Note  Patient identified on the Malnutrition Screening Tool (MST) Report  Patient presents with fever and altered mental status. Hx of COPD, DM2, GERD and reports chronic back pain.   Wt Readings from Last 15 Encounters:  01/12/21 58.1 kg  11/25/20 54.4 kg  11/11/20 56.7 kg  09/27/20 57.2 kg  09/02/20 56.7 kg  07/30/20 55.3 kg  06/01/20 58.1 kg  03/29/20 54.4 kg  01/28/20 55.6 kg  10/22/19 56.7 kg  07/24/19 55.8 kg  06/30/19 56.7 kg  05/20/19 56.2 kg  03/17/19 57.6 kg  02/03/19 63.5 kg    Body mass index is 24.2 kg/m. Patient meets criteria for normal based on current BMI. Weights noted above fluctuating between 54-58 kg.   Current diet order is Heart Healthy/CHO modified, patient is consuming approximately 50% of meals at this time. Tray set up provided for patient. She is able to feed herself. Patient reports decreased appetite the past month.   Labs and medications reviewed.  BMP Latest Ref Rng & Units 01/12/2021 01/11/2021 01/10/2021  Glucose 70 - 99 mg/dL 131(H) 134(H) 143(H)  BUN 8 - 23 mg/dL 11 15 16   Creatinine 0.44 - 1.00 mg/dL 1.05(H) 1.28(H) 1.50(H)  Sodium 135 - 145 mmol/L 136 135 137  Potassium 3.5 - 5.1 mmol/L 4.0 3.9 4.7  Chloride 98 - 111 mmol/L 108 104 102  CO2 22 - 32 mmol/L 20(L) 23 23  Calcium 8.9 - 10.3 mg/dL 7.7(L) 8.3(L) 9.0     No nutrition interventions warranted at this time. If nutrition issues arise, please consult RD.   Colman Cater MS,RD,CSG,LDN Contact: Shea Evans

## 2021-01-13 DIAGNOSIS — A419 Sepsis, unspecified organism: Secondary | ICD-10-CM | POA: Diagnosis not present

## 2021-01-13 DIAGNOSIS — R6520 Severe sepsis without septic shock: Secondary | ICD-10-CM | POA: Diagnosis not present

## 2021-01-13 LAB — IRON AND TIBC
Iron: 19 ug/dL — ABNORMAL LOW (ref 28–170)
Saturation Ratios: 7 % — ABNORMAL LOW (ref 10.4–31.8)
TIBC: 263 ug/dL (ref 250–450)
UIBC: 244 ug/dL

## 2021-01-13 LAB — CULTURE, BLOOD (ROUTINE X 2): Special Requests: ADEQUATE

## 2021-01-13 LAB — CBC WITH DIFFERENTIAL/PLATELET
Abs Immature Granulocytes: 0.04 10*3/uL (ref 0.00–0.07)
Basophils Absolute: 0.1 10*3/uL (ref 0.0–0.1)
Basophils Relative: 1 %
Eosinophils Absolute: 0.1 10*3/uL (ref 0.0–0.5)
Eosinophils Relative: 1 %
HCT: 24.7 % — ABNORMAL LOW (ref 36.0–46.0)
Hemoglobin: 8.1 g/dL — ABNORMAL LOW (ref 12.0–15.0)
Immature Granulocytes: 0 %
Lymphocytes Relative: 20 %
Lymphs Abs: 2.1 10*3/uL (ref 0.7–4.0)
MCH: 32.4 pg (ref 26.0–34.0)
MCHC: 32.8 g/dL (ref 30.0–36.0)
MCV: 98.8 fL (ref 80.0–100.0)
Monocytes Absolute: 1.2 10*3/uL — ABNORMAL HIGH (ref 0.1–1.0)
Monocytes Relative: 11 %
Neutro Abs: 7.3 10*3/uL (ref 1.7–7.7)
Neutrophils Relative %: 67 %
Platelets: 340 10*3/uL (ref 150–400)
RBC: 2.5 MIL/uL — ABNORMAL LOW (ref 3.87–5.11)
RDW: 13.4 % (ref 11.5–15.5)
WBC: 10.9 10*3/uL — ABNORMAL HIGH (ref 4.0–10.5)
nRBC: 0 % (ref 0.0–0.2)

## 2021-01-13 LAB — GLUCOSE, CAPILLARY
Glucose-Capillary: 100 mg/dL — ABNORMAL HIGH (ref 70–99)
Glucose-Capillary: 106 mg/dL — ABNORMAL HIGH (ref 70–99)
Glucose-Capillary: 115 mg/dL — ABNORMAL HIGH (ref 70–99)
Glucose-Capillary: 117 mg/dL — ABNORMAL HIGH (ref 70–99)
Glucose-Capillary: 155 mg/dL — ABNORMAL HIGH (ref 70–99)

## 2021-01-13 LAB — BASIC METABOLIC PANEL
Anion gap: 7 (ref 5–15)
BUN: 9 mg/dL (ref 8–23)
CO2: 23 mmol/L (ref 22–32)
Calcium: 8.2 mg/dL — ABNORMAL LOW (ref 8.9–10.3)
Chloride: 109 mmol/L (ref 98–111)
Creatinine, Ser: 1 mg/dL (ref 0.44–1.00)
GFR, Estimated: 59 mL/min — ABNORMAL LOW (ref >60)
Glucose, Bld: 111 mg/dL — ABNORMAL HIGH (ref 70–99)
Potassium: 3.8 mmol/L (ref 3.5–5.1)
Sodium: 139 mmol/L (ref 135–145)

## 2021-01-13 LAB — VITAMIN B12: Vitamin B-12: 881 pg/mL (ref 180–914)

## 2021-01-13 LAB — FERRITIN: Ferritin: 128 ng/mL (ref 11–307)

## 2021-01-13 LAB — FOLATE: Folate: 11.9 ng/mL (ref >5.9)

## 2021-01-13 LAB — MAGNESIUM: Magnesium: 1.8 mg/dL (ref 1.7–2.4)

## 2021-01-13 MED ORDER — CEPHALEXIN 500 MG PO CAPS: 500.0000 mg | ORAL_CAPSULE | Freq: Two times a day (BID) | ORAL | Status: DC

## 2021-01-13 MED ORDER — ENOXAPARIN SODIUM 40 MG/0.4ML IJ SOSY: 40.0000 mg | PREFILLED_SYRINGE | INTRAMUSCULAR | Status: DC

## 2021-01-13 MED ORDER — MAGNESIUM SULFATE 2 GM/50ML IV SOLN
2.0000 g | Freq: Once | INTRAVENOUS | Status: AC
  Administered 2021-01-13: 2 g via INTRAVENOUS
  Filled 2021-01-13: qty 50

## 2021-01-13 MED ORDER — CEPHALEXIN 500 MG PO CAPS: 500.0000 mg | ORAL_CAPSULE | Freq: Two times a day (BID) | ORAL | 0 refills | Status: AC

## 2021-01-13 NOTE — Consult Note (Signed)
Urology Consult  Referring physician: Dr. Erlinda Hong Reason for referral: Left renal calculus, pyelonephritis  Chief Complaint: left flank pain  History of Present Illness: Heather Espinoza is a 75yo who was admitted with sepsis from a urinary source. Starting Monday she developed urinary frequency, urinary urgency, dysuria and mild left flank pain. She has a hx of recurrent UTIs and gets 3-4 UTIs per year. CT scan obtained during this admission shows a 1cm left lower pole calculus with mild fullness of the lower pole. She was having intermittent fevers for the past 3 days. Urine culture grew E coli. This is her first stone event. Overall, she is improving on rocephin. No other associated symptoms. No exacerbating/alleviating events.   Past Medical History:  Diagnosis Date   Anemia    Arthritis    COPD (chronic obstructive pulmonary disease) ([place])    COVID-19 11/11/2020   Diabetes ([place])    Diverticulitis    Diverticulosis    Dyspnea    Fibromyalgia    High cholesterol    History of kidney stones    Hypertension    Hypothyroid    IBS (irritable bowel syndrome)    Osteopenia    Pneumonia    Restless leg    Sleep apnea    Doesn't use CPAP.     Tachycardia    per pt/fim   Tremor, essential 01/07/2018   Past Surgical History:  Procedure Laterality Date   ABDOMINAL HYSTERECTOMY     APPENDECTOMY     BACK SURGERY     BIOPSY  12/25/2016   Procedure: BIOPSY;  Surgeon: Rogene Houston, MD;  [facility];  Service: Endoscopy;;  gastric    BREAST LUMPECTOMY Right 03/20/1999   CATARACT EXTRACTION Bilateral 03/19/2014   CHOLECYSTECTOMY     COLONOSCOPY N/A 12/17/2012   Procedure: COLONOSCOPY;  Surgeon: Rogene Houston, MD;  [facility];  Service: Endoscopy;  Laterality: N/A;  215   COLONOSCOPY WITH PROPOFOL N/A 07/24/2019   Procedure: COLONOSCOPY WITH PROPOFOL;  Surgeon: Rogene Houston, MD;  [facility];  Service: Endoscopy;  Laterality: N/A;  730   complete  hysterectomy     DRUG INDUCED ENDOSCOPY N/A 09/02/2020   Procedure: DRUG INDUCED ENDOSCOPY;  Surgeon: Jerrell Belfast, MD;  Location: [place];  Service: ENT;  Laterality: N/A;   ESOPHAGEAL DILATION N/A 12/25/2016   Procedure: ESOPHAGEAL DILATION;  Surgeon: Rogene Houston, MD;  [facility];  Service: Endoscopy;  Laterality: N/A;   ESOPHAGEAL DILATION N/A 07/24/2019   Procedure: ESOPHAGEAL DILATION;  Surgeon: Rogene Houston, MD;  [facility];  Service: Endoscopy;  Laterality: N/A;   ESOPHAGOGASTRODUODENOSCOPY N/A 12/25/2016   Procedure: ESOPHAGOGASTRODUODENOSCOPY (EGD);  Surgeon: Rogene Houston, MD;  [facility];  Service: Endoscopy;  Laterality: N/A;  730   ESOPHAGOGASTRODUODENOSCOPY (EGD) WITH PROPOFOL N/A 07/24/2019   Procedure: ESOPHAGOGASTRODUODENOSCOPY (EGD) WITH PROPOFOL;  Surgeon: Rogene Houston, MD;  [facility];  Service: Endoscopy;  Laterality: N/A;   Heel tumor removed     IMPLANTATION OF HYPOGLOSSAL NERVE STIMULATOR Right 11/25/2020   Procedure: IMPLANTATION OF HYPOGLOSSAL NERVE STIMULATOR;  Surgeon: Jerrell Belfast, MD;  Location: [place];  Service: ENT;  Laterality: Right;   POLYPECTOMY  07/24/2019   Procedure: POLYPECTOMY;  Surgeon: Rogene Houston, MD;  [facility];  Service: Endoscopy;;   rt elbow surgery     TONSILLECTOMY      Medications: I have reviewed the  patient's current medications. Allergies:  Allergies  Allergen Reactions   Flagyl [Metronidazole] Shortness Of Breath and Swelling   Lyrica [Pregabalin] Shortness Of Breath   Adhesive [Tape] Other (See Comments)    Takes off patient's skin   Aspirin Other (See Comments)    Stomach bleed   Latex Itching and Rash   Phenergan [Promethazine Hcl] Other (See Comments)    Caused grogginess, altered mental status   Prednisone Anxiety   Sulfa Antibiotics Nausea Only   Tetanus Toxoids Swelling    Arm Area    Family History  Problem  Relation Age of Onset   Uterine cancer Mother    Parkinson's disease Father    Stroke Father    Colon cancer Brother    Lung cancer Brother    COPD Sister    Diabetes Grandchild    Asthma Son    Social History:  reports that she has never smoked. She has never used smokeless tobacco. She reports that she does not drink alcohol and does not use drugs.  Review of Systems  All other systems reviewed and are negative.  Physical Exam:  Vital signs in last 24 hours: Temp:  [97.7 F (36.5 C)-99.9 F (37.7 C)] 99.1 F (37.3 C) (10/28 0445) Pulse Rate:  [64-81] 73 (10/28 0445) Resp:  [18-19] 18 (10/28 0445) BP: (124-136)/(56-102) 132/102 (10/28 0445) SpO2:  [96 %-100 %] 99 % (10/28 0445) Physical Exam Vitals reviewed.  Constitutional:      Appearance: Normal appearance.  HENT:     Head: Normocephalic and atraumatic.     Nose: Nose normal. No congestion.     Mouth/Throat:     Mouth: Mucous membranes are dry.  Eyes:     Extraocular Movements: Extraocular movements intact.     Pupils: Pupils are equal, round, and reactive to light.  Cardiovascular:     Rate and Rhythm: Normal rate and regular rhythm.  Pulmonary:     Effort: Pulmonary effort is normal.     Breath sounds: Normal breath sounds.  Abdominal:     General: Abdomen is flat. There is no distension.  Musculoskeletal:        General: No swelling. Normal range of motion.     Cervical back: Normal range of motion and neck supple.  Skin:    General: Skin is warm and dry.  Neurological:     General: No focal deficit present.     Mental Status: She is alert and oriented to person, place, and time.  Psychiatric:        Mood and Affect: Mood normal.        Behavior: Behavior normal.        Thought Content: Thought content normal.        Judgment: Judgment normal.    Laboratory Data:  Results for orders placed or performed during the hospital encounter of 01/10/21 (from the past 72 hour(s))  Comprehensive metabolic  panel     Status: Abnormal   Collection Time: 01/10/21 11:31 PM  Result Value Ref Range   Sodium 137 135 - 145 mmol/L   Potassium 4.7 3.5 - 5.1 mmol/L   Chloride 102 98 - 111 mmol/L   CO2 23 22 - 32 mmol/L   Glucose, Bld 143 (H) 70 - 99 mg/dL    Comment: Glucose reference range applies only to samples taken after fasting for at least 8 hours.   BUN 16 8 - 23 mg/dL   Creatinine, Ser 1.50 (H) 0.44 - 1.00 mg/dL  Calcium 9.0 8.9 - 10.3 mg/dL   Total Protein 7.5 6.5 - 8.1 g/dL   Albumin 3.6 3.5 - 5.0 g/dL   AST 20 15 - 41 U/L   ALT 13 0 - 44 U/L   Alkaline Phosphatase 53 38 - 126 U/L   Total Bilirubin 0.9 0.3 - 1.2 mg/dL   GFR, Estimated 37 (L) >60 mL/min    Comment: (NOTE) Calculated using the CKD-EPI Creatinine Equation (2021)    Anion gap 12 5 - 15    Comment: Performed at [facility], [address], [place], [place] [address]  CBC with Differential     Status: Abnormal   Collection Time: 01/10/21 11:31 PM  Result Value Ref Range   WBC 27.4 (H) 4.0 - 10.5 K/uL   RBC 3.50 (L) 3.87 - 5.11 MIL/uL   Hemoglobin 11.4 (L) 12.0 - 15.0 g/dL   HCT 34.2 (L) 36.0 - 46.0 %   MCV 97.7 80.0 - 100.0 fL   MCH 32.6 26.0 - 34.0 pg   MCHC 33.3 30.0 - 36.0 g/dL   RDW 13.6 11.5 - 15.5 %   Platelets 526 (H) 150 - 400 K/uL   nRBC 0.0 0.0 - 0.2 %   Neutrophils Relative % 85 %   Neutro Abs 23.8 (H) 1.7 - 7.7 K/uL   Band Neutrophils 2 %   Lymphocytes Relative 7 %   Lymphs Abs 1.9 0.7 - 4.0 K/uL   Monocytes Relative 6 %   Monocytes Absolute 1.6 (H) 0.1 - 1.0 K/uL   Eosinophils Relative 0 %   Eosinophils Absolute 0.0 0.0 - 0.5 K/uL   Basophils Relative 0 %   Basophils Absolute 0.0 0.0 - 0.1 K/uL   WBC Morphology MILD LEFT SHIFT (1-5% METAS, OCC MYELO, OCC BANDS)     Comment: TOXIC GRANULATION Performed at [facility], [address] [place], [place] [address]   Protime-INR     Status: None   Collection Time: 01/10/21 11:31 PM  Result Value Ref Range   Prothrombin Time 14.6 11.4 - 15.2  seconds   INR 1.1 0.8 - 1.2    Comment: (NOTE) INR goal varies based on device and disease states. Performed at [facility], [address], [place], [place] [address]   APTT     Status: Abnormal   Collection Time: 01/10/21 11:31 PM  Result Value Ref Range   aPTT 37 (H) 24 - 36 seconds    Comment:        IF BASELINE aPTT IS ELEVATED, SUGGEST PATIENT RISK ASSESSMENT BE USED TO DETERMINE APPROPRIATE ANTICOAGULANT THERAPY. Performed at [facility], [address] [place], [place] [address]   Resp Panel by RT-PCR (Flu A&B, Covid) Nasopharyngeal Swab     Status: None   Collection Time: 01/10/21 11:54 PM   Specimen: Nasopharyngeal Swab; Nasopharyngeal(NP) swabs in vial transport medium  Result Value Ref Range   SARS Coronavirus 2 by RT PCR NEGATIVE NEGATIVE    Comment: (NOTE) SARS-CoV-2 target nucleic acids are NOT DETECTED.  The SARS-CoV-2 RNA is generally detectable in upper respiratory specimens during the acute phase of infection. The lowest concentration of SARS-CoV-2 viral copies this assay can detect is 138 copies/mL. A negative result does not preclude SARS-Cov-2 infection and should not be used as the sole basis for treatment or other patient management decisions. A negative result may occur with  improper specimen collection/handling, submission of specimen other than nasopharyngeal swab, presence of viral mutation(s) within the areas targeted by this assay, and  inadequate number of viral copies(<138 copies/mL). A negative result must be combined with clinical observations, patient history, and epidemiological information. The expected result is Negative.  Fact Sheet for Patients:  EntrepreneurPulse.com.au  Fact Sheet for Healthcare Providers:  IncredibleEmployment.be  This test is no t yet approved or cleared by the [place] FDA and  has been authorized for detection and/or diagnosis of SARS-CoV-2 by FDA under an Emergency  Use Authorization (EUA). This EUA will remain  in effect (meaning this test can be used) for the duration of the COVID-19 declaration under Section 564(b)(1) of the Act, 21 U.S.C.section 360bbb-3(b)(1), unless the authorization is terminated  or revoked sooner.       Influenza A by PCR NEGATIVE NEGATIVE   Influenza B by PCR NEGATIVE NEGATIVE    Comment: (NOTE) The Xpert Xpress SARS-CoV-2/FLU/RSV plus assay is intended as an aid in the diagnosis of influenza from Nasopharyngeal swab specimens and should not be used as a sole basis for treatment. Nasal washings and aspirates are unacceptable for Xpert Xpress SARS-CoV-2/FLU/RSV testing.  Fact Sheet for Patients: EntrepreneurPulse.com.au  Fact Sheet for Healthcare Providers: IncredibleEmployment.be  This test is not yet approved or cleared by the [place] FDA and has been authorized for detection and/or diagnosis of SARS-CoV-2 by FDA under an Emergency Use Authorization (EUA). This EUA will remain in effect (meaning this test can be used) for the duration of the COVID-19 declaration under Section 564(b)(1) of the Act, 21 U.S.C. section 360bbb-3(b)(1), unless the authorization is terminated or revoked.  Performed at [facility], [address], [place], [place] [address]   Lactic acid, plasma     Status: None   Collection Time: 01/10/21 11:54 PM  Result Value Ref Range   Lactic Acid, Venous 1.7 0.5 - 1.9 mmol/L    Comment: Performed at [facility], [address] [place], [place] [address]  Blood Culture (routine x 2)     Status: None (Preliminary result)   Collection Time: 01/10/21 11:54 PM   Specimen: BLOOD  Result Value Ref Range   Specimen Description      BLOOD BLOOD RIGHT ARM Performed at [facility], [address], [place], [place] [address]    Special Requests      BOTTLES DRAWN AEROBIC AND ANAEROBIC Blood Culture adequate volume Performed at [facility], [address] [place], [place] [address]    Culture  Setup Time      GRAM NEGATIVE RODS ANAEROBIC BOTTLE ONLY CRITICAL VALUE NOTED.  VALUE IS CONSISTENT WITH PREVIOUSLY REPORTED AND CALLED VALUE. Performed at [facility], [address] [place], [place] [address]    Culture [place]    Report Status PENDING   Blood Culture (routine x 2)     Status: Abnormal   Collection Time: 01/11/21 12:13 AM   Specimen: BLOOD  Result Value Ref Range   Specimen Description      BLOOD BLOOD RIGHT FOREARM Performed at [facility], [address], [place], [place] [address]    Special Requests      BOTTLES DRAWN AEROBIC AND ANAEROBIC Blood Culture adequate volume Performed at [facility] [place], [place] [address]    Culture  Setup Time      GRAM NEGATIVE RODS ANAEROBIC BOTTLE ONLY Gram Stain Report Called to,Read Back By and Verified With: WOODS,R AT 0932 ON 10.26.22 BY RUCINSKI,B CRITICAL RESULT CALLED TO, READ BACK BY AND VERIFIED WITH: [place] RN 01/12/2021 @0116  BY JW Performed at [facility], [place] [address] [place], [place] [address]    Culture ESCHERICHIA COLI (A)    Report Status 01/13/2021 FINAL    Organism ID, Bacteria ESCHERICHIA COLI       Susceptibility   Escherichia coli - MIC*    AMPICILLIN 8 SENSITIVE Sensitive     CEFAZOLIN <=4 SENSITIVE Sensitive     CEFEPIME <=0.12 SENSITIVE Sensitive     CEFTAZIDIME <=1 SENSITIVE Sensitive     CEFTRIAXONE <=0.25 SENSITIVE Sensitive     CIPROFLOXACIN <=0.25 SENSITIVE Sensitive     GENTAMICIN <=1 SENSITIVE Sensitive     IMIPENEM <=0.25 SENSITIVE Sensitive     TRIMETH/SULFA <=20 SENSITIVE Sensitive     AMPICILLIN/SULBACTAM <=2 SENSITIVE Sensitive     PIP/TAZO <=4 SENSITIVE Sensitive     * ESCHERICHIA COLI  Blood Culture ID Panel (Reflexed)     Status: Abnormal   Collection Time: 01/11/21 12:13 AM  Result Value Ref Range   Enterococcus faecalis NOT DETECTED NOT DETECTED   Enterococcus Faecium NOT DETECTED NOT  DETECTED   Listeria monocytogenes NOT DETECTED NOT DETECTED   Staphylococcus species NOT DETECTED NOT DETECTED   Staphylococcus aureus (BCID) NOT DETECTED NOT DETECTED   Staphylococcus epidermidis NOT DETECTED NOT DETECTED   Staphylococcus lugdunensis NOT DETECTED NOT DETECTED   Streptococcus species NOT DETECTED NOT DETECTED   Streptococcus agalactiae NOT DETECTED NOT DETECTED   Streptococcus pneumoniae NOT DETECTED NOT DETECTED   Streptococcus pyogenes NOT DETECTED NOT DETECTED   A.calcoaceticus-baumannii NOT DETECTED NOT DETECTED   Bacteroides fragilis NOT DETECTED NOT DETECTED   Enterobacterales DETECTED (A) NOT DETECTED    Comment: Enterobacterales represent a large order of gram negative bacteria, not a single organism. CRITICAL RESULT CALLED TO, READ BACK BY AND VERIFIED WITH: ANGIE COE RN 01/12/2021 @0116  BY JW    Enterobacter cloacae complex NOT DETECTED NOT DETECTED   Escherichia coli DETECTED (A) NOT DETECTED    Comment: CRITICAL RESULT CALLED TO, READ BACK BY AND VERIFIED WITH: ANGIE COE RN 01/12/2021 @0116  BY JW    Klebsiella aerogenes NOT DETECTED NOT DETECTED   Klebsiella oxytoca NOT DETECTED NOT DETECTED   Klebsiella pneumoniae NOT DETECTED NOT DETECTED   Proteus species NOT DETECTED NOT DETECTED   Salmonella species NOT DETECTED NOT DETECTED   Serratia marcescens NOT DETECTED NOT DETECTED   Haemophilus influenzae NOT DETECTED NOT DETECTED   Neisseria meningitidis NOT DETECTED NOT DETECTED   Pseudomonas aeruginosa NOT DETECTED NOT DETECTED   Stenotrophomonas maltophilia NOT DETECTED NOT DETECTED   Candida albicans NOT DETECTED NOT DETECTED   Candida auris NOT DETECTED NOT DETECTED   Candida glabrata NOT DETECTED NOT DETECTED   Candida krusei NOT DETECTED NOT DETECTED   Candida parapsilosis NOT DETECTED NOT DETECTED   Candida tropicalis NOT DETECTED NOT DETECTED   Cryptococcus neoformans/gattii NOT DETECTED NOT DETECTED   CTX-M ESBL NOT DETECTED NOT DETECTED    Carbapenem resistance IMP NOT DETECTED NOT DETECTED   Carbapenem resistance KPC NOT DETECTED NOT DETECTED   Carbapenem resistance NDM NOT DETECTED NOT DETECTED   Carbapenem resist OXA 48 LIKE NOT DETECTED NOT DETECTED   Carbapenem resistance VIM NOT DETECTED NOT DETECTED    Comment: Performed at [facility], [address], [place], [place] [address]  Urinalysis, Routine w reflex microscopic Urine, In & Out Cath     Status: Abnormal   Collection Time: 01/11/21  1:48 AM  Result Value Ref Range   Color, Urine YELLOW YELLOW   APPearance CLEAR CLEAR  Specific Gravity, Urine 1.005 1.005 - 1.030   pH 7.0 5.0 - 8.0   Glucose, UA NEGATIVE NEGATIVE mg/dL   Hgb urine dipstick SMALL (A) NEGATIVE   Bilirubin Urine NEGATIVE NEGATIVE   Ketones, ur NEGATIVE NEGATIVE mg/dL   Protein, ur NEGATIVE NEGATIVE mg/dL   Nitrite NEGATIVE NEGATIVE   Leukocytes,Ua LARGE (A) NEGATIVE   RBC / HPF 0-5 0 - 5 RBC/hpf   WBC, UA >50 (H) 0 - 5 WBC/hpf   Bacteria, UA RARE (A) NONE SEEN   Squamous Epithelial / LPF 0-5 0 - 5    Comment: Performed at [facility], [address], [place], [place] [address]  Urine Culture     Status: Abnormal (Preliminary result)   Collection Time: 01/11/21  1:48 AM   Specimen: In/Out Cath Urine  Result Value Ref Range   Specimen Description      IN/OUT CATH URINE Performed at [facility], [address], [place], [place] [address]    Special Requests      NONE Performed at [facility], [address], [place], [place] [address]    Culture (A)     2,000 COLONIES/mL ESCHERICHIA COLI SUSCEPTIBILITIES TO FOLLOW Performed at [facility], [place] [address], [place], [place] [address]    Report Status PENDING   Lactic acid, plasma     Status: Abnormal   Collection Time: 01/11/21  1:57 AM  Result Value Ref Range   Lactic Acid, Venous 2.3 (HH) 0.5 - 1.9 mmol/L    Comment: CRITICAL RESULT CALLED TO, READ BACK BY AND VERIFIED WITH: [place],K@[id number]  by Zigmund Daniel, b  10.26.22 Performed at [facility], [address] [place], [place] [address]   Lactic acid, plasma     Status: Abnormal   Collection Time: 01/11/21  5:38 AM  Result Value Ref Range   Lactic Acid, Venous 2.3 (HH) 0.5 - 1.9 mmol/L    Comment: CRITICAL VALUE NOTED.  VALUE IS CONSISTENT WITH PREVIOUSLY REPORTED AND CALLED VALUE. Performed at [facility], [address] [place], [place] [address]   Magnesium     Status: Abnormal   Collection Time: 01/11/21  6:35 AM  Result Value Ref Range   Magnesium 1.2 (L) 1.7 - 2.4 mg/dL    Comment: Performed at [facility], [address], [place], [place] [address]  Phosphorus     Status: Abnormal   Collection Time: 01/11/21  6:35 AM  Result Value Ref Range   Phosphorus 2.4 (L) 2.5 - 4.6 mg/dL    Comment: Performed at [facility], [address] [place], [place] [address]  Hemoglobin A1c     Status: Abnormal   Collection Time: 01/11/21  6:35 AM  Result Value Ref Range   Hgb A1c MFr Bld 6.0 (H) 4.8 - 5.6 %    Comment: (NOTE) Pre diabetes:          5.7%-6.4%  Diabetes:              >6.4%  Glycemic control for   <7.0% adults with diabetes    Mean Plasma Glucose 125.5 mg/dL    Comment: Performed at [place] [address] [place], [place] [address]  Lactic acid, plasma     Status: None   Collection Time: 01/11/21  6:38 AM  Result Value Ref Range   Lactic Acid, Venous 1.4 0.5 - 1.9 mmol/L    Comment: Performed at [facility], [name], [place], [place] [address]  Basic metabolic panel  Status: Abnormal   Collection Time: 01/11/21  8:33 AM  Result Value Ref Range   Sodium 135 135 - 145 mmol/L   Potassium 3.9 3.5 - 5.1 mmol/L    Comment: DELTA CHECK NOTED   Chloride 104 98 - 111 mmol/L   CO2 23 22 - 32 mmol/L   Glucose, Bld 134 (H) 70 - 99 mg/dL    Comment: Glucose reference range applies only to samples taken after fasting for at least 8 hours.   BUN 15 8 - 23 mg/dL   Creatinine, Ser 1.28 (H) 0.44 - 1.00 mg/dL    Calcium 8.3 (L) 8.9 - 10.3 mg/dL   GFR, Estimated 44 (L) >60 mL/min    Comment: (NOTE) Calculated using the CKD-EPI Creatinine Equation (2021)    Anion gap 8 5 - 15    Comment: Performed at [facility], [address], [place], [place] [address]  Blood gas, arterial     Status: Abnormal   Collection Time: 01/11/21  8:34 AM  Result Value Ref Range   FIO2 21.00    pH, Arterial 7.468 (H) 7.350 - 7.450   pCO2 arterial 30.5 (L) 32.0 - 48.0 mmHg   pO2, Arterial 91.2 83.0 - 108.0 mmHg   Bicarbonate 23.5 20.0 - 28.0 mmol/L   Acid-base deficit 1.4 0.0 - 2.0 mmol/L   O2 Saturation 97.0 %   Patient temperature 37.0    Allens test (pass/fail) PASS PASS    Comment: Performed at [facility], [address], [place], [place] [address]  CBG monitoring, ED     Status: Abnormal   Collection Time: 01/11/21  8:34 AM  Result Value Ref Range   Glucose-Capillary 132 (H) 70 - 99 mg/dL    Comment: Glucose reference range applies only to samples taken after fasting for at least 8 hours.  CBG monitoring, ED     Status: Abnormal   Collection Time: 01/11/21 12:17 PM  Result Value Ref Range   Glucose-Capillary 171 (H) 70 - 99 mg/dL    Comment: Glucose reference range applies only to samples taken after fasting for at least 8 hours.  CBG monitoring, ED     Status: Abnormal   Collection Time: 01/11/21  5:40 PM  Result Value Ref Range   Glucose-Capillary 142 (H) 70 - 99 mg/dL    Comment: Glucose reference range applies only to samples taken after fasting for at least 8 hours.  CBG monitoring, ED     Status: Abnormal   Collection Time: 01/11/21  8:49 PM  Result Value Ref Range   Glucose-Capillary 110 (H) 70 - 99 mg/dL    Comment: Glucose reference range applies only to samples taken after fasting for at least 8 hours.  CBG monitoring, ED     Status: Abnormal   Collection Time: 01/12/21 12:09 AM  Result Value Ref Range   Glucose-Capillary 133 (H) 70 - 99 mg/dL    Comment: Glucose reference range applies  only to samples taken after fasting for at least 8 hours.  Glucose, capillary     Status: Abnormal   Collection Time: 01/12/21  3:11 AM  Result Value Ref Range   Glucose-Capillary 113 (H) 70 - 99 mg/dL    Comment: Glucose reference range applies only to samples taken after fasting for at least 8 hours.  MRSA Next Gen by PCR, Nasal     Status: None   Collection Time: 01/12/21  3:40 AM   Specimen: Nasal Mucosa; Nasal Swab  Result Value Ref Range  MRSA by PCR Next Gen NOT DETECTED NOT DETECTED    Comment: (NOTE) The GeneXpert MRSA Assay (FDA approved for NASAL specimens only), is one component of a comprehensive MRSA colonization surveillance program. It is not intended to diagnose MRSA infection nor to guide or monitor treatment for MRSA infections. Test performance is not FDA approved in patients less than 1 years old. Performed at [facility], [address] [place], [place] [address]   Comprehensive metabolic panel     Status: Abnormal   Collection Time: 01/12/21  5:32 AM  Result Value Ref Range   Sodium 136 135 - 145 mmol/L   Potassium 4.0 3.5 - 5.1 mmol/L   Chloride 108 98 - 111 mmol/L   CO2 20 (L) 22 - 32 mmol/L   Glucose, Bld 131 (H) 70 - 99 mg/dL    Comment: Glucose reference range applies only to samples taken after fasting for at least 8 hours.   BUN 11 8 - 23 mg/dL   Creatinine, Ser 1.05 (H) 0.44 - 1.00 mg/dL   Calcium 7.7 (L) 8.9 - 10.3 mg/dL   Total Protein 5.7 (L) 6.5 - 8.1 g/dL   Albumin 2.5 (L) 3.5 - 5.0 g/dL   AST 13 (L) 15 - 41 U/L   ALT 8 0 - 44 U/L   Alkaline Phosphatase 42 38 - 126 U/L   Total Bilirubin 0.5 0.3 - 1.2 mg/dL   GFR, Estimated 56 (L) >60 mL/min    Comment: (NOTE) Calculated using the CKD-EPI Creatinine Equation (2021)    Anion gap 8 5 - 15    Comment: Performed at [facility], [address], [place], [place] [address]  CBC     Status: Abnormal   Collection Time: 01/12/21  5:32 AM  Result Value Ref Range   WBC 15.7 (H) 4.0 - 10.5 K/uL    RBC 2.51 (L) 3.87 - 5.11 MIL/uL   Hemoglobin 8.2 (L) 12.0 - 15.0 g/dL    Comment: REPEATED TO VERIFY DELTA CHECK NOTED.    HCT 25.4 (L) 36.0 - 46.0 %   MCV 101.2 (H) 80.0 - 100.0 fL   MCH 32.7 26.0 - 34.0 pg   MCHC 32.3 30.0 - 36.0 g/dL   RDW 13.7 11.5 - 15.5 %   Platelets 326 150 - 400 K/uL   nRBC 0.0 0.0 - 0.2 %    Comment: Performed at [facility], [address] [place], [place] [address]  Protime-INR     Status: Abnormal   Collection Time: 01/12/21  5:32 AM  Result Value Ref Range   Prothrombin Time 16.3 (H) 11.4 - 15.2 seconds   INR 1.3 (H) 0.8 - 1.2    Comment: (NOTE) INR goal varies based on device and disease states. Performed at [facility], [address], [place], [place] [address]   APTT     Status: Abnormal   Collection Time: 01/12/21  5:32 AM  Result Value Ref Range   aPTT 40 (H) 24 - 36 seconds    Comment:        IF BASELINE aPTT IS ELEVATED, SUGGEST PATIENT RISK ASSESSMENT BE USED TO DETERMINE APPROPRIATE ANTICOAGULANT THERAPY. Performed at [facility], [address] [place], [place] [address]   Magnesium     Status: None   Collection Time: 01/12/21  5:32 AM  Result Value Ref Range   Magnesium 1.7 1.7 - 2.4 mg/dL    Comment: Performed at [facility], [address] [place], [place] [address]  Glucose, capillary  Status: Abnormal   Collection Time: 01/12/21  7:52 AM  Result Value Ref Range   Glucose-Capillary 124 (H) 70 - 99 mg/dL    Comment: Glucose reference range applies only to samples taken after fasting for at least 8 hours.  Glucose, capillary     Status: Abnormal   Collection Time: 01/12/21 11:30 AM  Result Value Ref Range   Glucose-Capillary 128 (H) 70 - 99 mg/dL    Comment: Glucose reference range applies only to samples taken after fasting for at least 8 hours.  Glucose, capillary     Status: Abnormal   Collection Time: 01/12/21  4:00 PM  Result Value Ref Range   Glucose-Capillary 118 (H) 70 - 99 mg/dL    Comment: Glucose  reference range applies only to samples taken after fasting for at least 8 hours.  Glucose, capillary     Status: Abnormal   Collection Time: 01/12/21  9:16 PM  Result Value Ref Range   Glucose-Capillary 159 (H) 70 - 99 mg/dL    Comment: Glucose reference range applies only to samples taken after fasting for at least 8 hours.  Glucose, capillary     Status: Abnormal   Collection Time: 01/13/21 12:03 AM  Result Value Ref Range   Glucose-Capillary 100 (H) 70 - 99 mg/dL    Comment: Glucose reference range applies only to samples taken after fasting for at least 8 hours.  Glucose, capillary     Status: Abnormal   Collection Time: 01/13/21  4:07 AM  Result Value Ref Range   Glucose-Capillary 106 (H) 70 - 99 mg/dL    Comment: Glucose reference range applies only to samples taken after fasting for at least 8 hours.  CBC with Differential/Platelet     Status: Abnormal   Collection Time: 01/13/21  4:49 AM  Result Value Ref Range   WBC 10.9 (H) 4.0 - 10.5 K/uL   RBC 2.50 (L) 3.87 - 5.11 MIL/uL   Hemoglobin 8.1 (L) 12.0 - 15.0 g/dL   HCT 24.7 (L) 36.0 - 46.0 %   MCV 98.8 80.0 - 100.0 fL   MCH 32.4 26.0 - 34.0 pg   MCHC 32.8 30.0 - 36.0 g/dL   RDW 13.4 11.5 - 15.5 %   Platelets 340 150 - 400 K/uL   nRBC 0.0 0.0 - 0.2 %   Neutrophils Relative % 67 %   Neutro Abs 7.3 1.7 - 7.7 K/uL   Lymphocytes Relative 20 %   Lymphs Abs 2.1 0.7 - 4.0 K/uL   Monocytes Relative 11 %   Monocytes Absolute 1.2 (H) 0.1 - 1.0 K/uL   Eosinophils Relative 1 %   Eosinophils Absolute 0.1 0.0 - 0.5 K/uL   Basophils Relative 1 %   Basophils Absolute 0.1 0.0 - 0.1 K/uL   Immature Granulocytes 0 %   Abs Immature Granulocytes 0.04 0.00 - 0.07 K/uL    Comment: Performed at [facility], [address] [place], [place] [address]  Basic metabolic panel     Status: Abnormal   Collection Time: 01/13/21  4:49 AM  Result Value Ref Range   Sodium 139 135 - 145 mmol/L   Potassium 3.8 3.5 - 5.1 mmol/L   Chloride 109 98 -  111 mmol/L   CO2 23 22 - 32 mmol/L   Glucose, Bld 111 (H) 70 - 99 mg/dL    Comment: Glucose reference range applies only to samples taken after fasting for at least 8 hours.   BUN 9 8 - 23 mg/dL  Creatinine, Ser 1.00 0.44 - 1.00 mg/dL   Calcium 8.2 (L) 8.9 - 10.3 mg/dL   GFR, Estimated 59 (L) >60 mL/min    Comment: (NOTE) Calculated using the CKD-EPI Creatinine Equation (2021)    Anion gap 7 5 - 15    Comment: Performed at [facility], [address], [place], [place] [address]  Magnesium     Status: None   Collection Time: 01/13/21  4:49 AM  Result Value Ref Range   Magnesium 1.8 1.7 - 2.4 mg/dL    Comment: Performed at [facility], [address], [place], [place] [address]  Vitamin B12     Status: None   Collection Time: 01/13/21  4:49 AM  Result Value Ref Range   Vitamin B-12 881 180 - 914 pg/mL    Comment: (NOTE) This assay is not validated for testing neonatal or myeloproliferative syndrome specimens for Vitamin B12 levels. Performed at [facility], [address], [place], [place] [address]   Folate, serum, performed at [facility] lab     Status: None   Collection Time: 01/13/21  4:49 AM  Result Value Ref Range   Folate 11.9 >5.9 ng/mL    Comment: Performed at [facility], [address], [place], [place] [address]  Iron and TIBC     Status: Abnormal   Collection Time: 01/13/21  4:49 AM  Result Value Ref Range   Iron 19 (L) 28 - 170 ug/dL   TIBC 263 250 - 450 ug/dL   Saturation Ratios 7 (L) 10.4 - 31.8 %   UIBC 244 ug/dL    Comment: Performed at [facility], [address] [place], [place] [address]  Ferritin     Status: None   Collection Time: 01/13/21  4:49 AM  Result Value Ref Range   Ferritin 128 11 - 307 ng/mL    Comment: Performed at [facility], [address] [place], [place] [address]  Glucose, capillary     Status: Abnormal   Collection Time: 01/13/21  7:30 AM  Result Value Ref Range   Glucose-Capillary 115 (H) 70 - 99 mg/dL    Comment: Glucose  reference range applies only to samples taken after fasting for at least 8 hours.   Recent Results (from the past 240 hour(s))  Resp Panel by RT-PCR (Flu A&B, Covid) Nasopharyngeal Swab     Status: None   Collection Time: 01/10/21 11:54 PM   Specimen: Nasopharyngeal Swab; Nasopharyngeal(NP) swabs in vial transport medium  Result Value Ref Range Status   SARS Coronavirus 2 by RT PCR NEGATIVE NEGATIVE Final    Comment: (NOTE) SARS-CoV-2 target nucleic acids are NOT DETECTED.  The SARS-CoV-2 RNA is generally detectable in upper respiratory specimens during the acute phase of infection. The lowest concentration of SARS-CoV-2 viral copies this assay can detect is 138 copies/mL. A negative result does not preclude SARS-Cov-2 infection and should not be used as the sole basis for treatment or other patient management decisions. A negative result may occur with  improper specimen collection/handling, submission of specimen other than nasopharyngeal swab, presence of viral mutation(s) within the areas targeted by this assay, and inadequate number of viral copies(<138 copies/mL). A negative result must be combined with clinical observations, patient history, and epidemiological information. The expected result is Negative.  Fact Sheet for Patients:  EntrepreneurPulse.com.au  Fact Sheet for Healthcare Providers:  IncredibleEmployment.be  This test is no t yet approved or cleared by the [place] FDA and  has been authorized for detection and/or  diagnosis of SARS-CoV-2 by FDA under an Emergency Use Authorization (EUA). This EUA will remain  in effect (meaning this test can be used) for the duration of the COVID-19 declaration under Section 564(b)(1) of the Act, 21 U.S.C.section 360bbb-3(b)(1), unless the authorization is terminated  or revoked sooner.       Influenza A by PCR NEGATIVE NEGATIVE Final   Influenza B by PCR NEGATIVE NEGATIVE Final     Comment: (NOTE) The Xpert Xpress SARS-CoV-2/FLU/RSV plus assay is intended as an aid in the diagnosis of influenza from Nasopharyngeal swab specimens and should not be used as a sole basis for treatment. Nasal washings and aspirates are unacceptable for Xpert Xpress SARS-CoV-2/FLU/RSV testing.  Fact Sheet for Patients: EntrepreneurPulse.com.au  Fact Sheet for Healthcare Providers: IncredibleEmployment.be  This test is not yet approved or cleared by the [place] FDA and has been authorized for detection and/or diagnosis of SARS-CoV-2 by FDA under an Emergency Use Authorization (EUA). This EUA will remain in effect (meaning this test can be used) for the duration of the COVID-19 declaration under Section 564(b)(1) of the Act, 21 U.S.C. section 360bbb-3(b)(1), unless the authorization is terminated or revoked.  Performed at [facility], [address] [place], [place] [address]   Blood Culture (routine x 2)     Status: None (Preliminary result)   Collection Time: 01/10/21 11:54 PM   Specimen: BLOOD  Result Value Ref Range Status   Specimen Description   Final    BLOOD BLOOD RIGHT ARM Performed at [facility], [address], [place], [place] [address]    Special Requests   Final    BOTTLES DRAWN AEROBIC AND ANAEROBIC Blood Culture adequate volume Performed at [facility], [address] [place], [place] [address]    Culture  Setup Time   Final    GRAM NEGATIVE RODS ANAEROBIC BOTTLE ONLY CRITICAL VALUE NOTED.  VALUE IS CONSISTENT WITH PREVIOUSLY REPORTED AND CALLED VALUE. Performed at [facility], [address] [place], [place] [address]    Culture Rico  Final   Report Status PENDING  Incomplete  Blood Culture (routine x 2)     Status: Abnormal   Collection Time: 01/11/21 12:13 AM   Specimen: BLOOD  Result Value Ref Range Status   Specimen Description   Final    BLOOD BLOOD RIGHT FOREARM Performed at [facility], [address], [place], [place] [address]    Special Requests   Final    BOTTLES DRAWN AEROBIC AND ANAEROBIC Blood Culture adequate volume Performed at [place] [place],  [address]    Culture  Setup Time   Final    GRAM NEGATIVE RODS ANAEROBIC BOTTLE ONLY Gram Stain Report Called to,Read Back By and Verified With: WOODS,R AT 5208 ON 10.26.22 BY RUCINSKI,B CRITICAL RESULT CALLED TO, READ BACK BY AND VERIFIED WITH: ANGIE COE RN 01/12/2021 @0116  BY JW Performed at [facility], [place] [address] [place], [place] [address]    Culture ESCHERICHIA COLI (A)  Final   Report Status 01/13/2021 FINAL  Final   Organism ID, Bacteria ESCHERICHIA COLI  Final      Susceptibility   Escherichia coli - MIC*    AMPICILLIN 8 SENSITIVE Sensitive     CEFAZOLIN <=4 SENSITIVE Sensitive     CEFEPIME <=0.12 SENSITIVE Sensitive     CEFTAZIDIME <=1 SENSITIVE Sensitive     CEFTRIAXONE <=0.25 SENSITIVE Sensitive     CIPROFLOXACIN <=0.25 SENSITIVE Sensitive  GENTAMICIN <=1 SENSITIVE Sensitive     IMIPENEM <=0.25 SENSITIVE Sensitive     TRIMETH/SULFA <=20 SENSITIVE Sensitive     AMPICILLIN/SULBACTAM <=2 SENSITIVE Sensitive     PIP/TAZO <=4 SENSITIVE Sensitive     * ESCHERICHIA COLI  Blood Culture ID Panel (Reflexed)     Status: Abnormal   Collection Time: 01/11/21 12:13 AM  Result Value Ref Range Status   Enterococcus faecalis NOT DETECTED NOT DETECTED Final   Enterococcus Faecium NOT DETECTED NOT DETECTED Final   Listeria monocytogenes NOT DETECTED NOT DETECTED Final   Staphylococcus species NOT DETECTED NOT DETECTED Final   Staphylococcus aureus (BCID) NOT DETECTED NOT DETECTED Final   Staphylococcus epidermidis NOT DETECTED NOT DETECTED Final   Staphylococcus lugdunensis NOT DETECTED NOT DETECTED Final   Streptococcus species NOT DETECTED NOT DETECTED Final   Streptococcus agalactiae NOT DETECTED NOT DETECTED Final   Streptococcus pneumoniae NOT DETECTED NOT DETECTED  Final   Streptococcus pyogenes NOT DETECTED NOT DETECTED Final   A.calcoaceticus-baumannii NOT DETECTED NOT DETECTED Final   Bacteroides fragilis NOT DETECTED NOT DETECTED Final   Enterobacterales DETECTED (A) NOT DETECTED Final    Comment: Enterobacterales represent a large order of gram negative bacteria, not a single organism. CRITICAL RESULT CALLED TO, READ BACK BY AND VERIFIED WITH: ANGIE COE RN 01/12/2021 @0116  BY JW    Enterobacter cloacae complex NOT DETECTED NOT DETECTED Final   Escherichia coli DETECTED (A) NOT DETECTED Final    Comment: CRITICAL RESULT CALLED TO, READ BACK BY AND VERIFIED WITH: ANGIE COE RN 01/12/2021 @0116  BY JW    Klebsiella aerogenes NOT DETECTED NOT DETECTED Final   Klebsiella oxytoca NOT DETECTED NOT DETECTED Final   Klebsiella pneumoniae NOT DETECTED NOT DETECTED Final   Proteus species NOT DETECTED NOT DETECTED Final   Salmonella species NOT DETECTED NOT DETECTED Final   Serratia marcescens NOT DETECTED NOT DETECTED Final   Haemophilus influenzae NOT DETECTED NOT DETECTED Final   Neisseria meningitidis NOT DETECTED NOT DETECTED Final   Pseudomonas aeruginosa NOT DETECTED NOT DETECTED Final   Stenotrophomonas maltophilia NOT DETECTED NOT DETECTED Final   Candida albicans NOT DETECTED NOT DETECTED Final   Candida auris NOT DETECTED NOT DETECTED Final   Candida glabrata NOT DETECTED NOT DETECTED Final   Candida krusei NOT DETECTED NOT DETECTED Final   Candida parapsilosis NOT DETECTED NOT DETECTED Final   Candida tropicalis NOT DETECTED NOT DETECTED Final   Cryptococcus neoformans/gattii NOT DETECTED NOT DETECTED Final   CTX-M ESBL NOT DETECTED NOT DETECTED Final   Carbapenem resistance IMP NOT DETECTED NOT DETECTED Final   Carbapenem resistance KPC NOT DETECTED NOT DETECTED Final   Carbapenem resistance NDM NOT DETECTED NOT DETECTED Final   Carbapenem resist OXA 48 LIKE NOT DETECTED NOT DETECTED Final   Carbapenem resistance VIM NOT DETECTED NOT  DETECTED Final    Comment: Performed at [facility], [address] [place], [place] [address]  Urine Culture     Status: Abnormal (Preliminary result)   Collection Time: 01/11/21  1:48 AM   Specimen: In/Out Cath Urine  Result Value Ref Range Status   Specimen Description   Final    IN/OUT CATH URINE Performed at [facility], [address] [place], [place] [address]    Special Requests   Final    NONE Performed at [facility], [address], [place],  [address]    Culture (A)  Final    2,000 COLONIES/mL ESCHERICHIA COLI SUSCEPTIBILITIES TO FOLLOW Performed at [facility], [place] [address] [place], [place] [address]    Report Status PENDING  Incomplete  MRSA Next Gen by PCR, Nasal     Status: None   Collection Time: 01/12/21  3:40 AM   Specimen: Nasal Mucosa; Nasal Swab  Result Value Ref Range Status   MRSA by PCR Next Gen NOT DETECTED NOT DETECTED Final    Comment: (NOTE) The GeneXpert MRSA Assay (FDA approved for NASAL specimens only), is one component of a comprehensive MRSA colonization surveillance program. It is not intended to diagnose MRSA infection nor to guide or monitor treatment for MRSA infections. Test performance is not FDA approved in patients less than 73 years old. Performed at [facility], [address] [place], [place] [address]    Creatinine: Recent Labs    01/10/21 2331 01/11/21 6283 01/12/21 0532 01/13/21 0449  CREATININE 1.50* 1.28* 1.05* 1.00   Baseline Creatinine: 1  Impression/Assessment:  75yo with pyelonephritis, left renal calculus  Plan:  Pyelonephritis: Please continue broad spectrum antibiotics pending urine culture. The patient will need 10-14 days of culture specific antibiotics prior to treating her calculus Left renal calculus -We discussed the management of kidney stones. These options include observation, ureteroscopy, shockwave lithotripsy (ESWL) and percutaneous nephrolithotomy (PCNL). We discussed  which options are relevant to the patient's stone(s). We discussed the natural history of kidney stones as well as the complications of untreated stones and the impact on quality of life without treatment as well as with each of the above listed treatments. We also discussed the efficacy of each treatment in its ability to clear the stone burden. With any of these management options I discussed the signs and symptoms of infection and the need for emergent treatment should these be experienced. For each option we discussed the ability of each procedure to clear the patient of their stone burden.   For observation I described the risks which include but are not limited to silent renal damage, life-threatening infection, need for emergent surgery, failure to pass stone and pain.   For ureteroscopy I described the risks which include bleeding, infection, damage to contiguous structures, positioning injury, ureteral stricture, ureteral avulsion, ureteral injury, need for prolonged ureteral stent, inability to perform ureteroscopy, need for an interval procedure, inability to clear stone burden, stent discomfort/pain, heart attack, stroke, pulmonary embolus and the inherent risks with general anesthesia.   For shockwave lithotripsy I described the risks which include arrhythmia, kidney contusion, kidney hemorrhage, need for transfusion, pain, inability to adequately break up stone, inability to pass stone fragments, Steinstrasse, infection associated with obstructing stones, need for alternate surgical procedure, need for repeat shockwave lithotripsy, MI, CVA, PE and the inherent risks with anesthesia/conscious sedation.   For PCNL I described the risks including positioning injury, pneumothorax, hydrothorax, need for chest tube, inability to clear stone burden, renal laceration, arterial venous fistula or malformation, need for embolization of kidney, loss of kidney or renal function, need for repeat procedure,  need for prolonged nephrostomy tube, ureteral avulsion, MI, CVA, PE and the inherent risks of general anesthesia.   - The patient would like to proceed with ESWL versus ureteroscopy. This will be scheduled as an outpatient  Nicolette Bang 01/13/2021, 9:05 AM

## 2021-01-13 NOTE — Progress Notes (Signed)
Physical Therapy Treatment Patient Details Name: Heather Espinoza MRN: [id number] DOB: 1945/07/13 Today's Date: 01/13/2021   History of Present Illness Heather Espinoza is a 75 y.o. female with medical history significant for GERD, restless leg syndrome, COPD, hypothyroidism, hyperlipidemia, type 2 diabetes mellitus and chronic back pain who presents to the emergency department accompanied by husband.  Patient was unable to provide history, history was obtained from ED physician and husband at bedside.  Per husband, he states that when he returned home yesterday, he noted that patient was weaker than normal and mental status was also altered, so he thought that patient may be dehydrated, so EMS was activated and patient was taken to the ED for further evaluation and management.  There was no report of chest pain, shortness of breath, abdominal, nausea, vomiting, cough or cold symptoms.    PT Comments    Patient does not require assist with mobility today. She demonstrates good sitting balance and sitting tolerance EOB while completing seated exercises. She ambulates without use of AD without loss of balance. Patient returned to bed at end of session. Patient will benefit from continued physical therapy in hospital and recommended venue below to increase strength, balance, endurance for safe ADLs and gait.    Recommendations for follow up therapy are one component of a multi-disciplinary discharge planning process, led by the attending physician.  Recommendations may be updated based on patient status, additional functional criteria and insurance authorization.  Follow Up Recommendations  Home health PT     Assistance Recommended at Discharge Intermittent Supervision/Assistance  Equipment Recommendations  None recommended by PT    Recommendations for Other Services       Precautions / Restrictions Precautions Precautions: None Restrictions Weight Bearing Restrictions: No     Mobility  Bed  Mobility Overal bed mobility: Modified Independent                  Transfers Overall transfer level: Modified independent Equipment used: None                    Ambulation/Gait Ambulation/Gait assistance: Modified independent (Device/Increase time);Supervision Gait Distance (Feet): 150 Feet Assistive device: None Gait Pattern/deviations: Step-through pattern;Trunk flexed Gait velocity: reduced   General Gait Details: slightly slow, labored, near baseline   Stairs             Wheelchair Mobility    Modified Rankin (Stroke Patients Only)       Balance Overall balance assessment: Mild deficits observed, not formally tested                                          Cognition Arousal/Alertness: Awake/alert Behavior During Therapy: WFL for tasks assessed/performed Overall Cognitive Status: Within Functional Limits for tasks assessed                                          Exercises General Exercises - Lower Extremity Ankle Circles/Pumps: AROM;Both;15 reps Long Arc Quad: AROM;15 reps;Both Hip Flexion/Marching: AROM;15 reps;Both    General Comments        Pertinent Vitals/Pain Pain Assessment: No/denies pain    Home Living  Prior Function            PT Goals (current goals can now be found in the care plan section) Acute Rehab PT Goals Patient Stated Goal: to gohome PT Goal Formulation: With patient Time For Goal Achievement: 01/26/21 Potential to Achieve Goals: Good Progress towards PT goals: Progressing toward goals    Frequency    Min 3X/week      PT Plan Current plan remains appropriate    Co-evaluation              AM-PAC PT "6 Clicks" Mobility   Outcome Measure  Help needed turning from your back to your side while in a flat bed without using bedrails?: None Help needed moving from lying on your back to sitting on the side of a flat bed  without using bedrails?: None Help needed moving to and from a bed to a chair (including a wheelchair)?: None Help needed standing up from a chair using your arms (e.g., wheelchair or bedside chair)?: None Help needed to walk in hospital room?: None Help needed climbing 3-5 steps with a railing? : A Little 6 Click Score: 23    End of Session   Activity Tolerance: Patient tolerated treatment well Patient left: in bed;with call bell/phone within reach Nurse Communication: Mobility status PT Visit Diagnosis: Unsteadiness on feet (R26.81);Muscle weakness (generalized) (M62.81)     Time: 2778-2423 PT Time Calculation (min) (ACUTE ONLY): 22 min  Charges:  $Therapeutic Activity: 8-22 mins                     11:43 AM, 01/13/21 Mearl Latin PT, DPT Physical Therapist at [facility]

## 2021-01-13 NOTE — Discharge Summary (Signed)
Discharge Summary  Heather Espinoza JME:[id number] DOB: 03-Apr-1945  PCP: Heather Pepper, MD  Admit date: 01/10/2021 Discharge date: 01/13/2021  Time spent: 53mins, more than 50% time spent on coordination of care.  Husband updated at bedside  case is discussed with urology Heather Espinoza  Recommendations for Outpatient Follow-up:  F/u with PCP within a week  for hospital discharge follow up, repeat cbc/bmp at follow up, PCP to follow-up on anemia and blood pressure control Follow-up with urology Heather Espinoza Home health order placed   Only new medication at discharge is Keflex  Continue all home medications no changes  Discharge Diagnoses:  Active Hospital Problems   Diagnosis Date Noted   Severe sepsis ([place]) 01/11/2021   Altered mental status 01/11/2021   Leukocytosis 01/11/2021   Hyperglycemia due to diabetes mellitus ([place]) 01/11/2021   Lactic acidosis 01/11/2021   Anemia of chronic disease 01/11/2021   Chronic back pain 01/11/2021   Thrombocytosis 01/11/2021   Mixed hyperlipidemia 01/11/2021   Hypomagnesemia 01/11/2021   Hypophosphatemia 01/11/2021   Dehydration 01/11/2021   CKD (chronic kidney disease), stage III ([place]) 02/03/2019   COPD (chronic obstructive pulmonary disease) ([place]) 02/03/2019   GERD (gastroesophageal reflux disease) 02/17/2013   Essential hypertension, benign 11/24/2012   Acquired hypothyroidism 11/24/2012    Resolved Hospital Problems  No resolved problems to display.    Discharge Condition: stable  Diet recommendation: heart healthy/carb modified  Filed Weights   01/10/21 2205 01/12/21 0305  Weight: 54 kg 58.1 kg    History of present illness: (Per admitting MD Heather Espinoza) PCP: Heather Pepper, MD  Patient coming from: Home   Chief Complaint: Fever, altered mental status   HPI: Heather Espinoza is a 75 y.o. female with medical history significant for GERD, restless leg syndrome, COPD, hypothyroidism, hyperlipidemia, type 2 diabetes  mellitus and chronic back pain who presents to the emergency department accompanied by husband.  Patient was unable to provide history, history was obtained from ED physician and husband at bedside.  Per husband, he states that when he returned home yesterday, he noted that patient was weaker than normal and mental status was also altered, so he thought that patient may be dehydrated, so EMS was activated and patient was taken to the ED for further evaluation and management.  There was no report of chest pain, shortness of breath, abdominal, nausea, vomiting, cough or cold symptoms.   ED Course:  In the emergency department, she was febrile with a temperature of 102F, she was tachycardic and tachypneic.  Work-up in the ED showed leukocytosis with WBC at 27.4, normocytic anemia, thrombocytosis, lactic acid 1.7 > 2.3 > 2.3.  Urinalysis was positive for large leukocytes, > 50 WBC and rare bacteria.  Influenza A, B, SARS coronavirus 2 was negative.   CT of head without contrast showed no acute intracranial abnormality CT abdomen and pelvis without contrast showed 1 cm left inferior pole renal stone with mild fullness of the inferior pole collecting systems and mild left perinephric stranding. Correlation with urinalysis recommended to exclude UTI Chest x-ray showed no active cardiopulmonary disease She was treated with IV ceftriaxone, IV hydration was provided per sepsis protocol.  Hospitalist was asked to admit patient for further evaluation and management.  Hospital Course:  Principal Problem:   Severe sepsis ([place]) Active Problems:   Essential hypertension, benign   Acquired hypothyroidism   GERD (gastroesophageal reflux disease)   COPD (chronic obstructive pulmonary disease) ([facility])   CKD (chronic kidney disease), stage III ([place])  Altered mental status   Leukocytosis   Hyperglycemia due to diabetes mellitus ([facility])   Lactic acidosis   Anemia of chronic disease   Chronic back pain    Thrombocytosis   Mixed hyperlipidemia   Hypomagnesemia   Hypophosphatemia   Dehydration   Sepsis presents on admission with acute metabolic encephalopathy, UTI, E. coli bacteremia, left side kidney stone -Sepsis and acute metabolic encephalopathy resolved on Rocephin - blood culture grew E. coli pansensitive,  -Patient is seen by urology who recommend discharge home on oral antibiotic with close urology follow-up    AKI on CKD IIIa, anemia of chronic disease Creatinine normalized at discharge   Hypomagnesemia Replaced, improved   Noninsulin-dependent type 2 diabetes, controlled  metformin held in hospital, resumed at discharge    Hyperlipidemia, continue statin   Hypothyroidism Continue Synthroid   H/o lumbar surgery, chronic low back pain   COPD -Chart history of alpha-1 antitrypsin deficiency, no active bronchospasm on exam. -Does not use oxygen at baseline.       Body mass index is 24.2 kg/m..  OSA, not able to tolerate CPAP mask due to latex allergy At [employer] sleep apnea device installed a few months ago, she is to follow up with Dr Loma Sousa   FTT; seen by PT , home health order placed        Procedures: none  Consultations: Urology Heather Espinoza  Discharge Exam: BP 133/64 (BP Location: Left Arm)   Pulse 70   Temp 98.1 F (36.7 C) (Oral)   Resp 18   Ht 5\' 1"  (1.549 m)   Wt 58.1 kg   SpO2 100%   BMI 24.20 kg/m   General: NAD, AAOx3 Cardiovascular: RRR Respiratory: Normal respiratory effort  Discharge Instructions You were cared for by a hospitalist during your hospital stay. If you have any questions about your discharge medications or the care you received while you were in the hospital after you are discharged, you can call the unit and asked to speak with the hospitalist on call if the hospitalist that took care of you is not available. Once you are discharged, your primary care physician will handle any further medical issues. Please  note that NO REFILLS for any discharge medications will be authorized once you are discharged, as it is imperative that you return to your primary care physician (or establish a relationship with a primary care physician if you do not have one) for your aftercare needs so that they can reassess your need for medications and monitor your lab values.  Discharge Instructions     Diet - low sodium heart healthy   Complete by: As directed    Carb modified   Increase activity slowly   Complete by: As directed       Allergies as of 01/13/2021       Reactions   Flagyl [metronidazole] Shortness Of Breath, Swelling   Lyrica [pregabalin] Shortness Of Breath   Adhesive [tape] Other (See Comments)   Takes off patient's skin   Aspirin Other (See Comments)   Stomach bleed   Latex Itching, Rash   Phenergan [promethazine Hcl] Other (See Comments)   Caused grogginess, altered mental status   Prednisone Anxiety   Sulfa Antibiotics Nausea Only   Tetanus Toxoids Swelling   Arm Area        Medication List     STOP taking these medications    HYDROcodone-acetaminophen 5-325 MG tablet Commonly known as: Norco   polyethylene glycol powder 17 GM/SCOOP powder  Commonly known as: GLYCOLAX/MIRALAX   ZUPLENZ PO       TAKE these medications    acetaminophen 500 MG tablet Commonly known as: TYLENOL Take 1,000 mg by mouth every 6 (six) hours as needed for moderate pain.   albuterol 108 (90 Base) MCG/ACT inhaler Commonly known as: VENTOLIN HFA Inhale 2 puffs into the lungs every 6 (six) hours as needed for wheezing or shortness of breath.   B-12 2500 MCG Tabs Take 2,500 mcg by mouth daily.   cephALEXin 500 MG capsule Commonly known as: KEFLEX Take 1 capsule (500 mg total) by mouth every 12 (twelve) hours for 11 days.   dicyclomine 10 MG capsule Commonly known as: BENTYL Take 1 capsule (10 mg total) by mouth 2 (two) times daily before a meal. What changed:  when to take this Another  medication with the same name was removed. Continue taking this medication, and follow the directions you see here.   docusate sodium 100 MG capsule Commonly known as: Colace Take 2 capsules (200 mg total) by mouth at bedtime.   DULoxetine 30 MG capsule Commonly known as: CYMBALTA Take 30 mg by mouth daily.   esomeprazole 40 MG capsule Commonly known as: NEXIUM Take 1 capsule (40 mg total) by mouth daily before breakfast.   famotidine 40 MG tablet Commonly known as: PEPCID Take 1 tablet (40 mg total) by mouth at bedtime.   Fluticasone-Salmeterol 250-50 MCG/DOSE Aepb Commonly known as: ADVAIR Inhale 1 puff into the lungs every 12 (twelve) hours.   gabapentin 100 MG capsule Commonly known as: NEURONTIN Take 100 mg by mouth 3 (three) times daily. What changed: Another medication with the same name was removed. Continue taking this medication, and follow the directions you see here.   levothyroxine 75 MCG tablet Commonly known as: SYNTHROID Take 75 mcg by mouth daily before breakfast.   lisinopril 10 MG tablet Commonly known as: ZESTRIL Take 10 mg by mouth in the morning. What changed: Another medication with the same name was removed. Continue taking this medication, and follow the directions you see here.   lubiprostone 8 MCG capsule Commonly known as: AMITIZA Take 8 mcg by mouth 2 (two) times daily with a meal.   metFORMIN 500 MG 24 hr tablet Commonly known as: GLUCOPHAGE-XR Take 500 mg by mouth daily with supper.   mupirocin ointment 2 % Commonly known as: BACTROBAN Apply 1 application topically 2 (two) times daily.   neomycin-bacitracin-polymyxin ointment Commonly known as: NEOSPORIN Apply 1 application topically as needed for wound care.   ondansetron 4 MG tablet Commonly known as: ZOFRAN TAKE 1 TABLET BY MOUTH TWICE DAILY AS NEEDED FOR NAUSEA OR VOMITING What changed: See the new instructions.   simvastatin 20 MG tablet Commonly known as: ZOCOR Take 20 mg  by mouth every evening.   traMADol 50 MG tablet Commonly known as: ULTRAM Take 1-2 tablets by mouth every 6 (six) hours as needed. What changed: Another medication with the same name was removed. Continue taking this medication, and follow the directions you see here.   traZODone 100 MG tablet Commonly known as: DESYREL Take 50-100 mg by mouth at bedtime.       Allergies  Allergen Reactions   Flagyl [Metronidazole] Shortness Of Breath and Swelling   Lyrica [Pregabalin] Shortness Of Breath   Adhesive [Tape] Other (See Comments)    Takes off patient's skin   Aspirin Other (See Comments)    Stomach bleed   Latex Itching and Rash   Phenergan [  Promethazine Hcl] Other (See Comments)    Caused grogginess, altered mental status   Prednisone Anxiety   Sulfa Antibiotics Nausea Only   Tetanus Toxoids Swelling    Arm Area    Follow-up Information     Heather Pepper, MD Follow up in 1 week(s).   Specialty: Family Medicine Why: hospital discharge follow up, repeat cbc/bmp at follow up Dr Orland Mustard to monitor anemia please check your blood pressure twice a day at home, bring in record for your pcp to review, Contact information: [place] [address] [phone]         Cleon Gustin, MD. Call in 1 week(s).   Specialty: Urology Contact information: [address]  [place] [address] [phone]                  The results of significant diagnostics from this hospitalization (including imaging, microbiology, ancillary and laboratory) are listed below for reference.    Significant Diagnostic Studies: DG Chest 2 View  Result Date: 01/10/2021 CLINICAL DATA:  Back pain and lethargy. EXAM: CHEST - 2 VIEW COMPARISON:  Chest radiograph dated 11/25/2020. FINDINGS: The lungs are clear. There is no pleural effusion pneumothorax. The cardiac silhouette is within limits. No acute osseous pathology. Stimulator over the  anterior chest wall. IMPRESSION: No active cardiopulmonary disease. Electronically Signed   By: Anner Crete M.D.   On: 01/10/2021 22:57   CT HEAD WO CONTRAST (5MM)  Result Date: 01/11/2021 CLINICAL DATA:  Altered mental status. EXAM: CT HEAD WITHOUT CONTRAST TECHNIQUE: Contiguous axial images were obtained from the base of the skull through the vertex without intravenous contrast. COMPARISON:  None. FINDINGS: Brain: There is mild cerebral atrophy with widening of the extra-axial spaces and ventricular dilatation. There are areas of decreased attenuation within the white matter tracts of the supratentorial brain, consistent with microvascular disease changes. Mild, bilateral basal ganglia calcification is seen. Vascular: No hyperdense vessels are identified. Skull: Normal. Negative for fracture or focal lesion. Sinuses/Orbits: There is mild to moderate severity left maxillary sinus mucosal thickening. Other: None. IMPRESSION: No acute intracranial abnormality. Electronically Signed   By: Virgina Norfolk M.D.   On: 01/11/2021 00:53   CT RENAL STONE STUDY  Result Date: 01/11/2021 CLINICAL DATA:  Flank pain.  Concern for kidney stone. EXAM: CT ABDOMEN AND PELVIS WITHOUT CONTRAST TECHNIQUE: Multidetector CT imaging of the abdomen and pelvis was performed following the standard protocol without IV contrast. COMPARISON:  CT abdomen pelvis dated 07/30/2020. FINDINGS: Evaluation of this exam is limited in the absence of intravenous contrast. Lower chest: The visualized lung bases are clear. No intra-abdominal free air or free fluid. Hepatobiliary: The liver is unremarkable. No intrahepatic biliary dilatation. Cholecystectomy. Pancreas: Unremarkable. No pancreatic ductal dilatation or surrounding inflammatory changes. Spleen: Several small scattered calcified splenic granuloma. Adrenals/Urinary Tract: The adrenal glands are unremarkable. There is a 1 cm stone in the inferior pole of the left kidney with mild  fullness of the inferior pole collecting systems. There is mild left perinephric stranding. The right kidney is unremarkable. The visualized ureters and urinary bladder appear unremarkable. Stomach/Bowel: There is sigmoid diverticulosis without active inflammatory changes. Moderate stool throughout the colon. There is no bowel obstruction or active inflammation. Appendectomy. Vascular/Lymphatic: Mild aortoiliac atherosclerotic disease. The IVC is unremarkable. No portal venous gas. There is no adenopathy. Reproductive: Hysterectomy.  No adnexal masses. Other: None Musculoskeletal: Lower lumbar fusion hardware. No acute osseous pathology. IMPRESSION: 1. A 1 cm left  inferior pole renal stone with mild fullness of the inferior pole collecting systems and mild left perinephric stranding. Correlation with urinalysis recommended to exclude UTI. 2. Sigmoid diverticulosis. No bowel obstruction. 3. Aortic Atherosclerosis (ICD10-I70.0). Electronically Signed   By: Anner Crete M.D.   On: 01/11/2021 00:46    Microbiology: Recent Results (from the past 240 hour(s))  Resp Panel by RT-PCR (Flu A&B, Covid) Nasopharyngeal Swab     Status: None   Collection Time: 01/10/21 11:54 PM   Specimen: Nasopharyngeal Swab; Nasopharyngeal(NP) swabs in vial transport medium  Result Value Ref Range Status   SARS Coronavirus 2 by RT PCR NEGATIVE NEGATIVE Final    Comment: (NOTE) SARS-CoV-2 target nucleic acids are NOT DETECTED.  The SARS-CoV-2 RNA is generally detectable in upper respiratory specimens during the acute phase of infection. The lowest concentration of SARS-CoV-2 viral copies this assay can detect is 138 copies/mL. A negative result does not preclude SARS-Cov-2 infection and should not be used as the sole basis for treatment or other patient management decisions. A negative result may occur with  improper specimen collection/handling, submission of specimen other than nasopharyngeal swab, presence of viral  mutation(s) within the areas targeted by this assay, and inadequate number of viral copies(<138 copies/mL). A negative result must be combined with clinical observations, patient history, and epidemiological information. The expected result is Negative.  Fact Sheet for Patients:  EntrepreneurPulse.com.au  Fact Sheet for Healthcare Providers:  IncredibleEmployment.be  This test is no t yet approved or cleared by the [place] FDA and  has been authorized for detection and/or diagnosis of SARS-CoV-2 by FDA under an Emergency Use Authorization (EUA). This EUA will remain  in effect (meaning this test can be used) for the duration of the COVID-19 declaration under Section 564(b)(1) of the Act, 21 U.S.C.section 360bbb-3(b)(1), unless the authorization is terminated  or revoked sooner.       Influenza A by PCR NEGATIVE NEGATIVE Final   Influenza B by PCR NEGATIVE NEGATIVE Final    Comment: (NOTE) The Xpert Xpress SARS-CoV-2/FLU/RSV plus assay is intended as an aid in the diagnosis of influenza from Nasopharyngeal swab specimens and should not be used as a sole basis for treatment. Nasal washings and aspirates are unacceptable for Xpert Xpress SARS-CoV-2/FLU/RSV testing.  Fact Sheet for Patients: EntrepreneurPulse.com.au  Fact Sheet for Healthcare Providers: IncredibleEmployment.be  This test is not yet approved or cleared by the [place] FDA and has been authorized for detection and/or diagnosis of SARS-CoV-2 by FDA under an Emergency Use Authorization (EUA). This EUA will remain in effect (meaning this test can be used) for the duration of the COVID-19 declaration under Section 564(b)(1) of the Act, 21 U.S.C. section 360bbb-3(b)(1), unless the authorization is terminated or revoked.  Performed at [facility], [address], [place], [place] [address]   Blood Culture (routine x 2)     Status: None  (Preliminary result)   Collection Time: 01/10/21 11:54 PM   Specimen: BLOOD  Result Value Ref Range Status   Specimen Description   Final    BLOOD BLOOD RIGHT ARM Performed at [facility], [address] [place], [place] [address]    Special Requests   Final    BOTTLES DRAWN AEROBIC AND ANAEROBIC Blood Culture adequate volume Performed at [facility], [address], [place], [place] [address]    Culture  Setup Time   Final    GRAM NEGATIVE RODS ANAEROBIC BOTTLE ONLY CRITICAL VALUE NOTED.  VALUE IS CONSISTENT WITH PREVIOUSLY REPORTED  AND CALLED VALUE. Performed at [facility], [address], [place], [place] [address]    Culture Simms  Final   Report Status PENDING  Incomplete  Blood Culture (routine x 2)     Status: Abnormal   Collection Time: 01/11/21 12:13 AM   Specimen: BLOOD  Result Value Ref Range Status   Specimen Description   Final    BLOOD BLOOD RIGHT FOREARM Performed at [facility], [address], [place], [place] [address]    Special Requests   Final    BOTTLES DRAWN AEROBIC AND ANAEROBIC Blood Culture adequate volume Performed at [place] [place], [place] [address]    Culture  Setup Time   Final    GRAM NEGATIVE RODS ANAEROBIC BOTTLE ONLY Gram Stain Report Called to,Read Back By and Verified With: WOODS,R AT 0932 ON 10.26.22 BY RUCINSKI,B CRITICAL RESULT CALLED TO, READ BACK BY AND VERIFIED WITH: ANGIE COE RN 01/12/2021 @0116  BY JW Performed at [facility], [place] [address] [place], [place] [address]    Culture ESCHERICHIA COLI (A)  Final   Report Status 01/13/2021 FINAL  Final   Organism ID, Bacteria ESCHERICHIA COLI  Final      Susceptibility   Escherichia coli - MIC*    AMPICILLIN 8 SENSITIVE Sensitive     CEFAZOLIN <=4 SENSITIVE Sensitive     CEFEPIME <=0.12 SENSITIVE Sensitive     CEFTAZIDIME <=1 SENSITIVE Sensitive     CEFTRIAXONE <=0.25 SENSITIVE Sensitive     CIPROFLOXACIN <=0.25 SENSITIVE Sensitive      GENTAMICIN <=1 SENSITIVE Sensitive     IMIPENEM <=0.25 SENSITIVE Sensitive     TRIMETH/SULFA <=20 SENSITIVE Sensitive     AMPICILLIN/SULBACTAM <=2 SENSITIVE Sensitive     PIP/TAZO <=4 SENSITIVE Sensitive     * ESCHERICHIA COLI  Blood Culture ID Panel (Reflexed)     Status: Abnormal   Collection Time: 01/11/21 12:13 AM  Result Value Ref Range Status   Enterococcus faecalis NOT DETECTED NOT DETECTED Final   Enterococcus Faecium NOT DETECTED NOT DETECTED Final   Listeria monocytogenes NOT DETECTED NOT DETECTED Final   Staphylococcus species NOT DETECTED NOT DETECTED Final   Staphylococcus aureus (BCID) NOT DETECTED NOT DETECTED Final   Staphylococcus epidermidis NOT DETECTED NOT DETECTED Final   Staphylococcus lugdunensis NOT DETECTED NOT DETECTED Final   Streptococcus species NOT DETECTED NOT DETECTED Final   Streptococcus agalactiae NOT DETECTED NOT DETECTED Final   Streptococcus pneumoniae NOT DETECTED NOT DETECTED Final   Streptococcus pyogenes NOT DETECTED NOT DETECTED Final   A.calcoaceticus-baumannii NOT DETECTED NOT DETECTED Final   Bacteroides fragilis NOT DETECTED NOT DETECTED Final   Enterobacterales DETECTED (A) NOT DETECTED Final    Comment: Enterobacterales represent a large order of gram negative bacteria, not a single organism. CRITICAL RESULT CALLED TO, READ BACK BY AND VERIFIED WITH: ANGIE COE RN 01/12/2021 @0116  BY JW    Enterobacter cloacae complex NOT DETECTED NOT DETECTED Final   Escherichia coli DETECTED (A) NOT DETECTED Final    Comment: CRITICAL RESULT CALLED TO, READ BACK BY AND VERIFIED WITH: ANGIE COE RN 01/12/2021 @0116  BY JW    Klebsiella aerogenes NOT DETECTED NOT DETECTED Final   Klebsiella oxytoca NOT DETECTED NOT DETECTED Final   Klebsiella pneumoniae NOT DETECTED NOT DETECTED Final   Proteus species NOT DETECTED NOT DETECTED Final   Salmonella species NOT DETECTED NOT DETECTED Final   Serratia marcescens NOT DETECTED NOT DETECTED Final    Haemophilus influenzae NOT  DETECTED NOT DETECTED Final   Neisseria meningitidis NOT DETECTED NOT DETECTED Final   Pseudomonas aeruginosa NOT DETECTED NOT DETECTED Final   Stenotrophomonas maltophilia NOT DETECTED NOT DETECTED Final   Candida albicans NOT DETECTED NOT DETECTED Final   Candida auris NOT DETECTED NOT DETECTED Final   Candida glabrata NOT DETECTED NOT DETECTED Final   Candida krusei NOT DETECTED NOT DETECTED Final   Candida parapsilosis NOT DETECTED NOT DETECTED Final   Candida tropicalis NOT DETECTED NOT DETECTED Final   Cryptococcus neoformans/gattii NOT DETECTED NOT DETECTED Final   CTX-M ESBL NOT DETECTED NOT DETECTED Final   Carbapenem resistance IMP NOT DETECTED NOT DETECTED Final   Carbapenem resistance KPC NOT DETECTED NOT DETECTED Final   Carbapenem resistance NDM NOT DETECTED NOT DETECTED Final   Carbapenem resist OXA 48 LIKE NOT DETECTED NOT DETECTED Final   Carbapenem resistance VIM NOT DETECTED NOT DETECTED Final    Comment: Performed at [facility], [address] [place], [place] [address]  Urine Culture     Status: Abnormal (Preliminary result)   Collection Time: 01/11/21  1:48 AM   Specimen: In/Out Cath Urine  Result Value Ref Range Status   Specimen Description   Final    IN/OUT CATH URINE Performed at [facility], [address] [place], [place] [address]    Special Requests   Final    NONE Performed at [facility], [address] [place], [place] [address]    Culture (A)  Final    2,000 COLONIES/mL ESCHERICHIA COLI SUSCEPTIBILITIES TO FOLLOW Performed at [facility], [place] [address] [place], [place] [address]    Report Status PENDING  Incomplete  MRSA Next Gen by PCR, Nasal     Status: None   Collection Time: 01/12/21  3:40 AM   Specimen: Nasal Mucosa; Nasal Swab  Result Value Ref Range Status   MRSA by PCR Next Gen NOT DETECTED NOT DETECTED Final    Comment: (NOTE) The GeneXpert MRSA Assay (FDA approved for NASAL specimens  only), is one component of a comprehensive MRSA colonization surveillance program. It is not intended to diagnose MRSA infection nor to guide or monitor treatment for MRSA infections. Test performance is not FDA approved in patients less than 49 years old. Performed at [facility], [address] [place], [place] [address]      Labs: Basic Metabolic Panel: Recent Labs  Lab 01/10/21 2331 01/11/21 0635 01/11/21 0833 01/12/21 0532 01/13/21 0449  NA 137  --  135 136 139  K 4.7  --  3.9 4.0 3.8  CL 102  --  104 108 109  CO2 23  --  23 20* 23  GLUCOSE 143*  --  134* 131* 111*  BUN 16  --  15 11 9   CREATININE 1.50*  --  1.28* 1.05* 1.00  CALCIUM 9.0  --  8.3* 7.7* 8.2*  MG  --  1.2*  --  1.7 1.8  PHOS  --  2.4*  --   --   --    Liver Function Tests: Recent Labs  Lab 01/10/21 2331 01/12/21 0532  AST 20 13*  ALT 13 8  ALKPHOS 53 42  BILITOT 0.9 0.5  PROT 7.5 5.7*  ALBUMIN 3.6 2.5*   No results for input(s): LIPASE, AMYLASE in the last 168 hours. No results for input(s): AMMONIA in the last 168 hours. CBC: Recent Labs  Lab 01/10/21 2331 01/12/21 0532 01/13/21 0449  WBC 27.4* 15.7* 10.9*  NEUTROABS  23.8*  --  7.3  HGB 11.4* 8.2* 8.1*  HCT 34.2* 25.4* 24.7*  MCV 97.7 101.2* 98.8  PLT 526* 326 340   Cardiac Enzymes: No results for input(s): CKTOTAL, CKMB, CKMBINDEX, TROPONINI in the last 168 hours. BNP: BNP (last 3 results) No results for input(s): BNP in the last 8760 hours.  ProBNP (last 3 results) No results for input(s): PROBNP in the last 8760 hours.  CBG: Recent Labs  Lab 01/13/21 0003 01/13/21 0407 01/13/21 0730 01/13/21 1111 01/13/21 1643  GLUCAP 100* 106* 115* 117* 155*       Signed:  Florencia Reasons MD, PhD, FACP  [facility] 01/13/2021, 6:25 PM

## 2021-01-14 LAB — URINE CULTURE: Culture: 2000 — AB

## 2021-01-14 LAB — CULTURE, BLOOD (ROUTINE X 2): Special Requests: ADEQUATE

## 2021-01-30 ENCOUNTER — Encounter: Payer: Self-pay | Admitting: Urology

## 2021-01-30 ENCOUNTER — Ambulatory Visit (INDEPENDENT_AMBULATORY_CARE_PROVIDER_SITE_OTHER): Payer: Medicare Other | Admitting: Urology

## 2021-01-30 ENCOUNTER — Ambulatory Visit (HOSPITAL_COMMUNITY)
Admission: RE | Admit: 2021-01-30 | Discharge: 2021-01-30 | Disposition: A | Discharge status: Home / Self Care | Payer: Medicare Other | Source: Ambulatory Visit | Attending: Urology | Admitting: Urology

## 2021-01-30 ENCOUNTER — Other Ambulatory Visit: Payer: Self-pay

## 2021-01-30 VITALS — BP 122/59 | HR 62 | Temp 98.0°F

## 2021-01-30 DIAGNOSIS — N200 Calculus of kidney: Secondary | ICD-10-CM | POA: Insufficient documentation

## 2021-01-30 DIAGNOSIS — Z87440 Personal history of urinary (tract) infections: Secondary | ICD-10-CM | POA: Diagnosis not present

## 2021-01-30 DIAGNOSIS — R8290 Unspecified abnormal findings in urine: Secondary | ICD-10-CM

## 2021-01-30 MED ORDER — CEFDINIR 300 MG PO CAPS: 300.0000 mg | ORAL_CAPSULE | Freq: Two times a day (BID) | ORAL | 0 refills | Status: AC

## 2021-01-30 NOTE — Progress Notes (Signed)
Assessment: 1. Left nephrolithiasis   2. History of UTI   3. Abnormal urine findings     Plan: I reviewed the patient's records from her recent hospitalization including laboratory results and imaging results.  I personally reviewed the CT study 01/11/2021. Urine culture today Begin Omnicef 300 mg BID x 7 days KUB at [facility] today Return to office in 1 week to discuss options for management of stone  Chief Complaint:  Chief Complaint  Patient presents with   Nephrolithiasis   Urinary Tract Infection     History of Present Illness:  Heather Espinoza is a 75 y.o. year old female who is seen for further evaluation of a left renal calculus and pyelonephritis.  She was recently admitted to the hospital in October 2022 for pyelonephritis.  She has a history of recurrent UTIs getting 3-4 UTIs per year.  CT scan obtained during the hospital admission showed a 1 cm left lower pole calculus with mild fullness of the lower pole.  Urine culture grew E. coli.  He was treated with culture appropriate antibiotics.  She was seen by Dr. Alyson Ingles while in the hospital.  Treatment options for the left renal calculus were discussed including ureteroscopy and shockwave lithotripsy. She returns today for follow-up.  She has completed her antibiotics.  She is not having any UTI symptoms at the present time.  No flank pain.  No dysuria or gross hematuria.  Past Medical History:  Past Medical History:  Diagnosis Date   Anemia    Arthritis    COPD (chronic obstructive pulmonary disease) ([place])    COVID-19 11/11/2020   Diabetes ([place])    Diverticulitis    Diverticulosis    Dyspnea    Fibromyalgia    High cholesterol    History of kidney stones    Hypertension    Hypothyroid    IBS (irritable bowel syndrome)    Osteopenia    Pneumonia    Restless leg    Sleep apnea    Doesn't use CPAP.     Tachycardia    per pt/fim   Tremor, essential 01/07/2018    Past Surgical History:  Past Surgical  History:  Procedure Laterality Date   ABDOMINAL HYSTERECTOMY     APPENDECTOMY     BACK SURGERY     BIOPSY  12/25/2016   Procedure: BIOPSY;  Surgeon: Rogene Houston, MD;  [facility];  Service: Endoscopy;;  gastric    BREAST LUMPECTOMY Right 03/20/1999   CATARACT EXTRACTION Bilateral 03/19/2014   CHOLECYSTECTOMY     COLONOSCOPY N/A 12/17/2012   Procedure: COLONOSCOPY;  Surgeon: Rogene Houston, MD;  [facility];  Service: Endoscopy;  Laterality: N/A;  215   COLONOSCOPY WITH PROPOFOL N/A 07/24/2019   Procedure: COLONOSCOPY WITH PROPOFOL;  Surgeon: Rogene Houston, MD;  [facility];  Service: Endoscopy;  Laterality: N/A;  730   complete hysterectomy     DRUG INDUCED ENDOSCOPY N/A 09/02/2020   Procedure: DRUG INDUCED ENDOSCOPY;  Surgeon: Jerrell Belfast, MD;  Location: [place];  Service: ENT;  Laterality: N/A;   ESOPHAGEAL DILATION N/A 12/25/2016   Procedure: ESOPHAGEAL DILATION;  Surgeon: Rogene Houston, MD;  [facility];  Service: Endoscopy;  Laterality: N/A;   ESOPHAGEAL DILATION N/A 07/24/2019   Procedure: ESOPHAGEAL DILATION;  Surgeon: Rogene Houston, MD;  [facility];  Service: Endoscopy;  Laterality: N/A;   ESOPHAGOGASTRODUODENOSCOPY N/A 12/25/2016   Procedure: ESOPHAGOGASTRODUODENOSCOPY (  EGD);  Surgeon: Rogene Houston, MD;  [facility];  Service: Endoscopy;  Laterality: N/A;  730   ESOPHAGOGASTRODUODENOSCOPY (EGD) WITH PROPOFOL N/A 07/24/2019   Procedure: ESOPHAGOGASTRODUODENOSCOPY (EGD) WITH PROPOFOL;  Surgeon: Rogene Houston, MD;  [facility];  Service: Endoscopy;  Laterality: N/A;   Heel tumor removed     IMPLANTATION OF HYPOGLOSSAL NERVE STIMULATOR Right 11/25/2020   Procedure: IMPLANTATION OF HYPOGLOSSAL NERVE STIMULATOR;  Surgeon: Jerrell Belfast, MD;  Location: [place];  Service: ENT;  Laterality: Right;   POLYPECTOMY  07/24/2019   Procedure: POLYPECTOMY;  Surgeon:  Rogene Houston, MD;  [facility];  Service: Endoscopy;;   rt elbow surgery     TONSILLECTOMY      Allergies:  Allergies  Allergen Reactions   Flagyl [Metronidazole] Shortness Of Breath and Swelling   Lyrica [Pregabalin] Shortness Of Breath   Adhesive [Tape] Other (See Comments)    Takes off patient's skin   Aspirin Other (See Comments)    Stomach bleed   Latex Itching and Rash   Phenergan [Promethazine Hcl] Other (See Comments)    Caused grogginess, altered mental status   Prednisone Anxiety   Sulfa Antibiotics Nausea Only   Tetanus Toxoids Swelling    Arm Area    Family History:  Family History  Problem Relation Age of Onset   Uterine cancer Mother    Parkinson's disease Father    Stroke Father    Colon cancer Brother    Lung cancer Brother    COPD Sister    Diabetes Grandchild    Asthma Son     Social History:  Social History   Tobacco Use   Smoking status: Never   Smokeless tobacco: Never  Vaping Use   Vaping Use: Never used  Substance Use Topics   Alcohol use: No    Alcohol/week: 0.0 standard drinks   Drug use: No    Review of symptoms:  Constitutional:  Negative for unexplained weight loss, night sweats, fever, chills ENT:  Negative for nose bleeds, sinus pain, painful swallowing CV:  Negative for chest pain, shortness of breath, exercise intolerance, palpitations, loss of consciousness Resp:  Negative for cough, wheezing, shortness of breath GI:  Negative for nausea, vomiting, diarrhea, bloody stools GU:  Positives noted in HPI; otherwise negative for gross hematuria, dysuria, urinary incontinence Neuro:  Negative for seizures, poor balance, limb weakness, slurred speech Psych:  Negative for lack of energy, depression, anxiety Endocrine:  Negative for polydipsia, polyuria, symptoms of hypoglycemia (dizziness, hunger, sweating) Hematologic:  Negative for anemia, purpura, petechia, prolonged or excessive bleeding, use of anticoagulants   Allergic:  Negative for difficulty breathing or choking as a result of exposure to anything; no shellfish allergy; no allergic response (rash/itch) to materials, foods  Physical exam: BP (!) 122/59   Pulse 62   Temp 98 F (36.7 C)  GENERAL APPEARANCE:  Well appearing, well developed, well nourished, NAD HEENT: Atraumatic, Normocephalic, oropharynx clear. NECK: Supple without lymphadenopathy or thyromegaly. LUNGS: Clear to auscultation bilaterally. HEART: Regular Rate and Rhythm without murmurs, gallops, or rubs. ABDOMEN: Soft, non-tender, No Masses. EXTREMITIES: Moves all extremities well.  Without clubbing, cyanosis, or edema. NEUROLOGIC:  Alert and oriented x 3, normal gait, CN II-XII grossly intact.  MENTAL STATUS:  Appropriate. BACK:  Non-tender to palpation.  No CVAT SKIN:  Warm, dry and intact.    Results: U/A dipstick:  trace blood, 3+ LE, + nitrite

## 2021-01-31 ENCOUNTER — Other Ambulatory Visit (INDEPENDENT_AMBULATORY_CARE_PROVIDER_SITE_OTHER): Payer: Self-pay | Admitting: Internal Medicine

## 2021-01-31 NOTE — Telephone Encounter (Signed)
Seen 09/27/20 for chronic nausea. Next appt 04/04/21

## 2021-02-01 LAB — URINE CULTURE

## 2021-02-02 ENCOUNTER — Telehealth: Payer: Self-pay

## 2021-02-02 NOTE — Telephone Encounter (Signed)
-----   Message from Primus Bravo, MD sent at 02/02/2021  8:58 AM EST ----- Please notify patient to continue Cefdinir as prescribed for UTI.

## 2021-02-02 NOTE — Telephone Encounter (Signed)
Patient was called by Lurline Hare. No answer and no message left.

## 2021-02-07 ENCOUNTER — Ambulatory Visit (INDEPENDENT_AMBULATORY_CARE_PROVIDER_SITE_OTHER): Payer: Medicare Other | Admitting: Urology

## 2021-02-07 ENCOUNTER — Other Ambulatory Visit: Payer: Self-pay

## 2021-02-07 ENCOUNTER — Encounter: Payer: Self-pay | Admitting: Urology

## 2021-02-07 VITALS — BP 140/67 | HR 65

## 2021-02-07 DIAGNOSIS — N200 Calculus of kidney: Secondary | ICD-10-CM

## 2021-02-07 DIAGNOSIS — Z87440 Personal history of urinary (tract) infections: Secondary | ICD-10-CM | POA: Diagnosis not present

## 2021-02-07 LAB — URINALYSIS, ROUTINE W REFLEX MICROSCOPIC
Bilirubin, UA: NEGATIVE
Glucose, UA: NEGATIVE
Ketones, UA: NEGATIVE
Leukocytes,UA: NEGATIVE
Nitrite, UA: NEGATIVE
Protein,UA: NEGATIVE
RBC, UA: NEGATIVE
Specific Gravity, UA: 1.02 (ref 1.005–1.030)
Urobilinogen, Ur: 0.2 mg/dL (ref 0.2–1.0)
pH, UA: 5.5 (ref 5.0–7.5)

## 2021-02-07 MED ORDER — CEPHALEXIN 250 MG PO CAPS: 250.0000 mg | ORAL_CAPSULE | Freq: Every day | ORAL | 1 refills | Status: DC

## 2021-02-07 NOTE — H&P (View-Only) (Signed)
Assessment: 1. Left nephrolithiasis   2. History of UTI     Plan: Recommend beginning cephalexin 250 mg daily for UTI prevention. I personally reviewed the KUB from 01/30/2021 showing a 9 mm calcification in the left lower renal shadow. Options for management of the left renal calculus discussed including shockwave lithotripsy, ureteroscopic laser lithotripsy, and percutaneous nephrolithotomy.  Risk and benefits of each treatment discussed.  Following our discussion, she would like to proceed with left shockwave lithotripsy.  Procedure: The patient will be scheduled for Left ESL at [facility].  Surgical request is placed with the surgery schedulers and will be scheduled at the patient's/family request. Informed consent is given as documented below. Anesthesia:  local  The patient does not have sleep apnea, history of MRSA, history of VRE, history of cardiac device requiring special anesthetic needs. Patient is stable and considered clear for surgical in an outpatient ambulatory surgery setting as well as patient hospital setting.  Consent for Operation or Procedure: Provider Certification I hereby certify that the nature, purpose, benefits, usual and most frequent risks of, and alternatives to, the operation or procedure have been explained to the patient (or person authorized to sign for the patient) either by me as responsible physician or by the provider who is to perform the operation or procedure. Time spent such that the patient/family has had an opportunity to ask questions, and that those questions have been answered. The patient or the patient's representative has been advised that selected tasks may be performed by assistants to the primary health care provider(s). I believe that the patient (or person authorized to sign for the patient) understands what has been explained, and has consented to the operation or procedure. No guarantees were implied or made.   Chief Complaint:   Chief Complaint  Patient presents with   Nephrolithiasis    History of Present Illness:  Heather Espinoza is a 75 y.o. year old female who is seen for further evaluation of a left renal calculus and pyelonephritis.  She was recently admitted to the hospital in October 2022 for pyelonephritis.  She has a history of recurrent UTIs getting 3-4 UTIs per year.  CT scan obtained during the hospital admission showed a 1 cm left lower pole calculus with mild fullness of the lower pole.  Urine culture grew E. coli.  He was treated with culture appropriate antibiotics.  She was seen by Dr. Alyson Ingles while in the hospital.  Treatment options for the left renal calculus were discussed including ureteroscopy and shockwave lithotripsy. She completed her antibiotics.  She was not having any UTI symptoms at her visit on 01/30/21.  No flank pain.  No dysuria or gross hematuria. Urine culture from 01/30/21 grew >100 K E. Coli.  Treated with Cefdinir x 7 days. KUB from 01/30/21 showed 9 mm calcification in lower left renal shadow.  She returns today for follow-up.  She has completed the cefdinir.  No current UTI symptoms.  No flank pain.  Portions of the above documentation were copied from a prior visit for review purposes only.   Past Medical History:  Past Medical History:  Diagnosis Date   Anemia    Arthritis    COPD (chronic obstructive pulmonary disease) ([place])    COVID-19 11/11/2020   Diabetes ([place])    Diverticulitis    Diverticulosis    Dyspnea    Fibromyalgia    High cholesterol    History of kidney stones    Hypertension    Hypothyroid  IBS (irritable bowel syndrome)    Osteopenia    Pneumonia    Restless leg    Sleep apnea    Doesn't use CPAP.     Tachycardia    per pt/fim   Tremor, essential 01/07/2018    Past Surgical History:  Past Surgical History:  Procedure Laterality Date   ABDOMINAL HYSTERECTOMY     APPENDECTOMY     BACK SURGERY     BIOPSY  12/25/2016   Procedure:  BIOPSY;  Surgeon: Rogene Houston, MD;  [facility];  Service: Endoscopy;;  gastric    BREAST LUMPECTOMY Right 03/20/1999   CATARACT EXTRACTION Bilateral 03/19/2014   CHOLECYSTECTOMY     COLONOSCOPY N/A 12/17/2012   Procedure: COLONOSCOPY;  Surgeon: Rogene Houston, MD;  [facility];  Service: Endoscopy;  Laterality: N/A;  215   COLONOSCOPY WITH PROPOFOL N/A 07/24/2019   Procedure: COLONOSCOPY WITH PROPOFOL;  Surgeon: Rogene Houston, MD;  [facility];  Service: Endoscopy;  Laterality: N/A;  730   complete hysterectomy     DRUG INDUCED ENDOSCOPY N/A 09/02/2020   Procedure: DRUG INDUCED ENDOSCOPY;  Surgeon: Jerrell Belfast, MD;  Location: [place];  Service: ENT;  Laterality: N/A;   ESOPHAGEAL DILATION N/A 12/25/2016   Procedure: ESOPHAGEAL DILATION;  Surgeon: Rogene Houston, MD;  [facility];  Service: Endoscopy;  Laterality: N/A;   ESOPHAGEAL DILATION N/A 07/24/2019   Procedure: ESOPHAGEAL DILATION;  Surgeon: Rogene Houston, MD;  [facility];  Service: Endoscopy;  Laterality: N/A;   ESOPHAGOGASTRODUODENOSCOPY N/A 12/25/2016   Procedure: ESOPHAGOGASTRODUODENOSCOPY (EGD);  Surgeon: Rogene Houston, MD;  [facility];  Service: Endoscopy;  Laterality: N/A;  730   ESOPHAGOGASTRODUODENOSCOPY (EGD) WITH PROPOFOL N/A 07/24/2019   Procedure: ESOPHAGOGASTRODUODENOSCOPY (EGD) WITH PROPOFOL;  Surgeon: Rogene Houston, MD;  [facility];  Service: Endoscopy;  Laterality: N/A;   Heel tumor removed     IMPLANTATION OF HYPOGLOSSAL NERVE STIMULATOR Right 11/25/2020   Procedure: IMPLANTATION OF HYPOGLOSSAL NERVE STIMULATOR;  Surgeon: Jerrell Belfast, MD;  Location: [place];  Service: ENT;  Laterality: Right;   POLYPECTOMY  07/24/2019   Procedure: POLYPECTOMY;  Surgeon: Rogene Houston, MD;  [facility];  Service: Endoscopy;;   rt elbow surgery     TONSILLECTOMY      Allergies:   Allergies  Allergen Reactions   Flagyl [Metronidazole] Shortness Of Breath and Swelling   Lyrica [Pregabalin] Shortness Of Breath   Adhesive [Tape] Other (See Comments)    Takes off patient's skin   Aspirin Other (See Comments)    Stomach bleed   Latex Itching and Rash   Phenergan [Promethazine Hcl] Other (See Comments)    Caused grogginess, altered mental status   Prednisone Anxiety   Sulfa Antibiotics Nausea Only   Tetanus Toxoids Swelling    Arm Area    Family History:  Family History  Problem Relation Age of Onset   Uterine cancer Mother    Parkinson's disease Father    Stroke Father    Colon cancer Brother    Lung cancer Brother    COPD Sister    Diabetes Grandchild    Asthma Son     Social History:  Social History   Tobacco Use   Smoking status: Never   Smokeless tobacco: Never  Vaping Use   Vaping Use: Never used  Substance Use Topics   Alcohol use: No    Alcohol/week:  0.0 standard drinks   Drug use: No    ROS: Constitutional:  Negative for fever, chills, weight loss CV: Negative for chest pain, previous MI, hypertension Respiratory:  Negative for shortness of breath, wheezing, sleep apnea, frequent cough GI:  Negative for nausea, vomiting, bloody stool, GERD  Physical exam: BP 140/67   Pulse 65  GENERAL APPEARANCE:  Well appearing, well developed, well nourished, NAD HEENT: Atraumatic, Normocephalic, oropharynx clear. NECK: Supple without lymphadenopathy or thyromegaly. LUNGS: Clear to auscultation bilaterally. HEART: Regular Rate and Rhythm without murmurs, gallops, or rubs. ABDOMEN: Soft, non-tender, No Masses. EXTREMITIES: Moves all extremities well.  Without clubbing, cyanosis, or edema. NEUROLOGIC:  Alert and oriented x 3, normal gait, CN II-XII grossly intact.  MENTAL STATUS:  Appropriate. BACK:  Non-tender to palpation.  No CVAT SKIN:  Warm, dry and intact.     Results: U/A:  dipstick negative

## 2021-02-07 NOTE — Progress Notes (Signed)
Assessment: 1. Left nephrolithiasis   2. History of UTI     Plan: Recommend beginning cephalexin 250 mg daily for UTI prevention. I personally reviewed the KUB from 01/30/2021 showing a 9 mm calcification in the left lower renal shadow. Options for management of the left renal calculus discussed including shockwave lithotripsy, ureteroscopic laser lithotripsy, and percutaneous nephrolithotomy.  Risk and benefits of each treatment discussed.  Following our discussion, she would like to proceed with left shockwave lithotripsy.  Procedure: The patient will be scheduled for Left ESL at [facility].  Surgical request is placed with the surgery schedulers and will be scheduled at the patient's/family request. Informed consent is given as documented below. Anesthesia:  local  The patient does not have sleep apnea, history of MRSA, history of VRE, history of cardiac device requiring special anesthetic needs. Patient is stable and considered clear for surgical in an outpatient ambulatory surgery setting as well as patient hospital setting.  Consent for Operation or Procedure: Provider Certification I hereby certify that the nature, purpose, benefits, usual and most frequent risks of, and alternatives to, the operation or procedure have been explained to the patient (or person authorized to sign for the patient) either by me as responsible physician or by the provider who is to perform the operation or procedure. Time spent such that the patient/family has had an opportunity to ask questions, and that those questions have been answered. The patient or the patient's representative has been advised that selected tasks may be performed by assistants to the primary health care provider(s). I believe that the patient (or person authorized to sign for the patient) understands what has been explained, and has consented to the operation or procedure. No guarantees were implied or made.   Chief Complaint:   Chief Complaint  Patient presents with   Nephrolithiasis    History of Present Illness:  Heather Espinoza is a 75 y.o. year old female who is seen for further evaluation of a left renal calculus and pyelonephritis.  She was recently admitted to the hospital in October 2022 for pyelonephritis.  She has a history of recurrent UTIs getting 3-4 UTIs per year.  CT scan obtained during the hospital admission showed a 1 cm left lower pole calculus with mild fullness of the lower pole.  Urine culture grew E. coli.  He was treated with culture appropriate antibiotics.  She was seen by Dr. Alyson Ingles while in the hospital.  Treatment options for the left renal calculus were discussed including ureteroscopy and shockwave lithotripsy. She completed her antibiotics.  She was not having any UTI symptoms at her visit on 01/30/21.  No flank pain.  No dysuria or gross hematuria. Urine culture from 01/30/21 grew >100 K E. Coli.  Treated with Cefdinir x 7 days. KUB from 01/30/21 showed 9 mm calcification in lower left renal shadow.  She returns today for follow-up.  She has completed the cefdinir.  No current UTI symptoms.  No flank pain.  Portions of the above documentation were copied from a prior visit for review purposes only.   Past Medical History:  Past Medical History:  Diagnosis Date   Anemia    Arthritis    COPD (chronic obstructive pulmonary disease) ([place])    COVID-19 11/11/2020   Diabetes ([place])    Diverticulitis    Diverticulosis    Dyspnea    Fibromyalgia    High cholesterol    History of kidney stones    Hypertension    Hypothyroid  IBS (irritable bowel syndrome)    Osteopenia    Pneumonia    Restless leg    Sleep apnea    Doesn't use CPAP.     Tachycardia    per pt/fim   Tremor, essential 01/07/2018    Past Surgical History:  Past Surgical History:  Procedure Laterality Date   ABDOMINAL HYSTERECTOMY     APPENDECTOMY     BACK SURGERY     BIOPSY  12/25/2016   Procedure:  BIOPSY;  Surgeon: Rogene Houston, MD;  [facility];  Service: Endoscopy;;  gastric    BREAST LUMPECTOMY Right 03/20/1999   CATARACT EXTRACTION Bilateral 03/19/2014   CHOLECYSTECTOMY     COLONOSCOPY N/A 12/17/2012   Procedure: COLONOSCOPY;  Surgeon: Rogene Houston, MD;  [facility];  Service: Endoscopy;  Laterality: N/A;  215   COLONOSCOPY WITH PROPOFOL N/A 07/24/2019   Procedure: COLONOSCOPY WITH PROPOFOL;  Surgeon: Rogene Houston, MD;  [facility];  Service: Endoscopy;  Laterality: N/A;  730   complete hysterectomy     DRUG INDUCED ENDOSCOPY N/A 09/02/2020   Procedure: DRUG INDUCED ENDOSCOPY;  Surgeon: Jerrell Belfast, MD;  Location: [place];  Service: ENT;  Laterality: N/A;   ESOPHAGEAL DILATION N/A 12/25/2016   Procedure: ESOPHAGEAL DILATION;  Surgeon: Rogene Houston, MD;  [facility];  Service: Endoscopy;  Laterality: N/A;   ESOPHAGEAL DILATION N/A 07/24/2019   Procedure: ESOPHAGEAL DILATION;  Surgeon: Rogene Houston, MD;  [facility];  Service: Endoscopy;  Laterality: N/A;   ESOPHAGOGASTRODUODENOSCOPY N/A 12/25/2016   Procedure: ESOPHAGOGASTRODUODENOSCOPY (EGD);  Surgeon: Rogene Houston, MD;  [facility];  Service: Endoscopy;  Laterality: N/A;  730   ESOPHAGOGASTRODUODENOSCOPY (EGD) WITH PROPOFOL N/A 07/24/2019   Procedure: ESOPHAGOGASTRODUODENOSCOPY (EGD) WITH PROPOFOL;  Surgeon: Rogene Houston, MD;  [facility];  Service: Endoscopy;  Laterality: N/A;   Heel tumor removed     IMPLANTATION OF HYPOGLOSSAL NERVE STIMULATOR Right 11/25/2020   Procedure: IMPLANTATION OF HYPOGLOSSAL NERVE STIMULATOR;  Surgeon: Jerrell Belfast, MD;  Location: [place];  Service: ENT;  Laterality: Right;   POLYPECTOMY  07/24/2019   Procedure: POLYPECTOMY;  Surgeon: Rogene Houston, MD;  [facility];  Service: Endoscopy;;   rt elbow surgery     TONSILLECTOMY      Allergies:   Allergies  Allergen Reactions   Flagyl [Metronidazole] Shortness Of Breath and Swelling   Lyrica [Pregabalin] Shortness Of Breath   Adhesive [Tape] Other (See Comments)    Takes off patient's skin   Aspirin Other (See Comments)    Stomach bleed   Latex Itching and Rash   Phenergan [Promethazine Hcl] Other (See Comments)    Caused grogginess, altered mental status   Prednisone Anxiety   Sulfa Antibiotics Nausea Only   Tetanus Toxoids Swelling    Arm Area    Family History:  Family History  Problem Relation Age of Onset   Uterine cancer Mother    Parkinson's disease Father    Stroke Father    Colon cancer Brother    Lung cancer Brother    COPD Sister    Diabetes Grandchild    Asthma Son     Social History:  Social History   Tobacco Use   Smoking status: Never   Smokeless tobacco: Never  Vaping Use   Vaping Use: Never used  Substance Use Topics   Alcohol use: No    Alcohol/week:  0.0 standard drinks   Drug use: No    ROS: Constitutional:  Negative for fever, chills, weight loss CV: Negative for chest pain, previous MI, hypertension Respiratory:  Negative for shortness of breath, wheezing, sleep apnea, frequent cough GI:  Negative for nausea, vomiting, bloody stool, GERD  Physical exam: BP 140/67   Pulse 65  GENERAL APPEARANCE:  Well appearing, well developed, well nourished, NAD HEENT: Atraumatic, Normocephalic, oropharynx clear. NECK: Supple without lymphadenopathy or thyromegaly. LUNGS: Clear to auscultation bilaterally. HEART: Regular Rate and Rhythm without murmurs, gallops, or rubs. ABDOMEN: Soft, non-tender, No Masses. EXTREMITIES: Moves all extremities well.  Without clubbing, cyanosis, or edema. NEUROLOGIC:  Alert and oriented x 3, normal gait, CN II-XII grossly intact.  MENTAL STATUS:  Appropriate. BACK:  Non-tender to palpation.  No CVAT SKIN:  Warm, dry and intact.     Results: U/A:  dipstick negative

## 2021-02-07 NOTE — Progress Notes (Signed)
Urological Symptom Review  Patient is experiencing the following symptoms: Frequent urination Hard to postpone urination Get up at night to urinate Leakage of urine Urinary tract infection Painful intercourse   Review of Systems  Gastrointestinal (upper)  : Indigestion/heartburn  Gastrointestinal (lower) : Diarrhea Constipation  Constitutional : Weight loss Fatigue  Skin: Negative for skin symptoms  Eyes: Negative for eye symptoms  Ear/Nose/Throat : Sinus problems  Hematologic/Lymphatic: Easy bruising  Cardiovascular : Negative for cardiovascular symptoms  Respiratory : Shortness of breath  Endocrine: Excessive thirst  Musculoskeletal: Back pain Joint pain  Neurological: Headaches  Psychologic: Depression Anxiety

## 2021-02-13 ENCOUNTER — Other Ambulatory Visit: Payer: Self-pay

## 2021-02-13 ENCOUNTER — Encounter (HOSPITAL_COMMUNITY): Payer: Self-pay

## 2021-02-13 ENCOUNTER — Telehealth: Payer: Self-pay

## 2021-02-13 ENCOUNTER — Encounter (HOSPITAL_COMMUNITY)
Admission: RE | Admit: 2021-02-13 | Discharge: 2021-02-13 | Disposition: A | Discharge status: Home / Self Care | Payer: Medicare Other | Source: Ambulatory Visit | Attending: Urology | Admitting: Urology

## 2021-02-13 NOTE — Telephone Encounter (Signed)
Received call from patient. Unable to get to hospital tomorrow for ESWL due to family sickness. Patient needs to rescheduled ESWL. Message sent to [facility] to move ESWL to next week per patient.

## 2021-02-20 ENCOUNTER — Encounter (HOSPITAL_COMMUNITY): Payer: Self-pay

## 2021-02-20 ENCOUNTER — Other Ambulatory Visit: Payer: Self-pay

## 2021-02-20 ENCOUNTER — Encounter (HOSPITAL_COMMUNITY)
Admission: RE | Admit: 2021-02-20 | Discharge: 2021-02-20 | Disposition: A | Discharge status: Home / Self Care | Payer: Medicare Other | Source: Ambulatory Visit | Attending: Urology | Admitting: Urology

## 2021-02-21 ENCOUNTER — Encounter (HOSPITAL_COMMUNITY): Payer: Self-pay | Admitting: Urology

## 2021-02-21 ENCOUNTER — Encounter (HOSPITAL_COMMUNITY)
Admission: RE | Disposition: A | Discharge status: Home / Self Care | Payer: Self-pay | Source: Home / Self Care | Attending: Urology

## 2021-02-21 ENCOUNTER — Ambulatory Visit (HOSPITAL_COMMUNITY)
Admission: RE | Admit: 2021-02-21 | Discharge: 2021-02-21 | Disposition: A | Discharge status: Home / Self Care | Payer: Medicare Other | Attending: Urology | Admitting: Urology

## 2021-02-21 ENCOUNTER — Ambulatory Visit (HOSPITAL_COMMUNITY): Payer: Medicare Other

## 2021-02-21 DIAGNOSIS — E119 Type 2 diabetes mellitus without complications: Secondary | ICD-10-CM | POA: Diagnosis not present

## 2021-02-21 DIAGNOSIS — Z87440 Personal history of urinary (tract) infections: Secondary | ICD-10-CM | POA: Insufficient documentation

## 2021-02-21 DIAGNOSIS — N200 Calculus of kidney: Secondary | ICD-10-CM | POA: Diagnosis not present

## 2021-02-21 HISTORY — PX: EXTRACORPOREAL SHOCK WAVE LITHOTRIPSY: SHX1557

## 2021-02-21 LAB — GLUCOSE, CAPILLARY: Glucose-Capillary: 86 mg/dL (ref 70–99)

## 2021-02-21 SURGERY — LITHOTRIPSY, ESWL
Anesthesia: LOCAL | Laterality: Left

## 2021-02-21 MED ORDER — DIAZEPAM 5 MG PO TABS
10.0000 mg | ORAL_TABLET | Freq: Once | ORAL | Status: AC
  Administered 2021-02-21: 10 mg via ORAL

## 2021-02-21 MED ORDER — SODIUM CHLORIDE 0.9 % IV SOLN: INTRAVENOUS | Status: DC

## 2021-02-21 MED ORDER — HYDROCODONE-ACETAMINOPHEN 5-325 MG PO TABS: 1.0000 | ORAL_TABLET | Freq: Four times a day (QID) | ORAL | 0 refills | Status: DC | PRN

## 2021-02-21 MED ORDER — DIPHENHYDRAMINE HCL 25 MG PO CAPS
25.0000 mg | ORAL_CAPSULE | ORAL | Status: AC
  Administered 2021-02-21: 25 mg via ORAL

## 2021-02-21 NOTE — Interval H&P Note (Signed)
History and Physical Interval Note:  02/21/2021 7:31 AM  Heather Espinoza  has presented today for surgery, with the diagnosis of left neprholithiasis.  The various methods of treatment have been discussed with the patient and family. After consideration of risks, benefits and other options for treatment, the patient has consented to  Procedure(s): EXTRACORPOREAL SHOCK WAVE LITHOTRIPSY (ESWL) (Left) as a surgical intervention.  The patient's history has been reviewed, patient examined, no change in status, stable for surgery.  I have reviewed the patient's chart and labs.  Questions were answered to the patient's satisfaction.     Michaelle Birks

## 2021-02-21 NOTE — Progress Notes (Signed)
Made Dr.Stoneking aware that is unable to urinate. Completed the rest of fluids left after procedure, and given oral fluids. Patient educated on urinary retention and when to be seen and verbalizes understanding.

## 2021-02-23 ENCOUNTER — Ambulatory Visit: Payer: Medicare Other | Admitting: Urology

## 2021-02-24 ENCOUNTER — Encounter (HOSPITAL_COMMUNITY): Payer: Self-pay | Admitting: Urology

## 2021-02-28 ENCOUNTER — Ambulatory Visit: Payer: Medicare Other | Admitting: Urology

## 2021-02-28 NOTE — Progress Notes (Deleted)
Assessment: 1. Left nephrolithiasis   2. History of UTI      Plan: Recommend beginning cephalexin 250 mg daily for UTI prevention.  Chief Complaint:  No chief complaint on file.   History of Present Illness:  Heather Espinoza is a 75 y.o. year old female who is seen for further evaluation of a left renal calculus and pyelonephritis.  She was recently admitted to the hospital in October 2022 for pyelonephritis.  She has a history of recurrent UTIs getting 3-4 UTIs per year.  CT scan obtained during the hospital admission showed a 1 cm left lower pole calculus with mild fullness of the lower pole.  Urine culture grew E. coli.  He was treated with culture appropriate antibiotics.  She was seen by Dr. Alyson Ingles while in the hospital.  Treatment options for the left renal calculus were discussed including ureteroscopy and shockwave lithotripsy. She completed her antibiotics.  She was not having any UTI symptoms at her visit on 01/30/21.  No flank pain.  No dysuria or gross hematuria. Urine culture from 01/30/21 grew >100 K E. Coli.  Treated with Cefdinir x 7 days. KUB from 01/30/21 showed 9 mm calcification in lower left renal shadow.  She underwent left ESL on 02/22/2021.   Portions of the above documentation were copied from a prior visit for review purposes only.   Past Medical History:  Past Medical History:  Diagnosis Date   Anemia    Arthritis    COPD (chronic obstructive pulmonary disease) ([place])    COVID-19 11/11/2020   Diabetes ([place])    Diverticulitis    Diverticulosis    Dyspnea    Fibromyalgia    High cholesterol    History of kidney stones    Hypertension    Hypothyroid    IBS (irritable bowel syndrome)    Osteopenia    Pneumonia    Restless leg    Sleep apnea    Doesn't use CPAP.     Tachycardia    per pt/fim   Tremor, essential 01/07/2018    Past Surgical History:  Past Surgical History:  Procedure Laterality Date   ABDOMINAL HYSTERECTOMY      APPENDECTOMY     BACK SURGERY     BIOPSY  12/25/2016   Procedure: BIOPSY;  Surgeon: Rogene Houston, MD;  [facility];  Service: Endoscopy;;  gastric    BREAST LUMPECTOMY Right 03/20/1999   CATARACT EXTRACTION Bilateral 03/19/2014   CHOLECYSTECTOMY     COLONOSCOPY N/A 12/17/2012   Procedure: COLONOSCOPY;  Surgeon: Rogene Houston, MD;  [facility];  Service: Endoscopy;  Laterality: N/A;  215   COLONOSCOPY WITH PROPOFOL N/A 07/24/2019   Procedure: COLONOSCOPY WITH PROPOFOL;  Surgeon: Rogene Houston, MD;  [facility];  Service: Endoscopy;  Laterality: N/A;  730   complete hysterectomy     DRUG INDUCED ENDOSCOPY N/A 09/02/2020   Procedure: DRUG INDUCED ENDOSCOPY;  Surgeon: Jerrell Belfast, MD;  Location: [place];  Service: ENT;  Laterality: N/A;   ESOPHAGEAL DILATION N/A 12/25/2016   Procedure: ESOPHAGEAL DILATION;  Surgeon: Rogene Houston, MD;  [facility];  Service: Endoscopy;  Laterality: N/A;   ESOPHAGEAL DILATION N/A 07/24/2019   Procedure: ESOPHAGEAL DILATION;  Surgeon: Rogene Houston, MD;  [facility];  Service: Endoscopy;  Laterality: N/A;   ESOPHAGOGASTRODUODENOSCOPY N/A 12/25/2016   Procedure: ESOPHAGOGASTRODUODENOSCOPY (EGD);  Surgeon: Rogene Houston, MD;  [facility];  Service: Endoscopy;  Laterality: N/A;  730   ESOPHAGOGASTRODUODENOSCOPY (EGD) WITH PROPOFOL N/A 07/24/2019   Procedure: ESOPHAGOGASTRODUODENOSCOPY (EGD) WITH PROPOFOL;  Surgeon: Rogene Houston, MD;  [facility];  Service: Endoscopy;  Laterality: N/A;   EXTRACORPOREAL SHOCK WAVE LITHOTRIPSY Left 02/21/2021   Procedure: EXTRACORPOREAL SHOCK WAVE LITHOTRIPSY (ESWL);  Surgeon: Primus Bravo., MD;  [facility];  Service: Urology;  Laterality: Left;   Heel tumor removed     IMPLANTATION OF HYPOGLOSSAL NERVE STIMULATOR Right 11/25/2020   Procedure: IMPLANTATION OF HYPOGLOSSAL NERVE STIMULATOR;   Surgeon: Jerrell Belfast, MD;  Location: [place];  Service: ENT;  Laterality: Right;   POLYPECTOMY  07/24/2019   Procedure: POLYPECTOMY;  Surgeon: Rogene Houston, MD;  [facility];  Service: Endoscopy;;   rt elbow surgery     THUMB FUSION Bilateral    TONSILLECTOMY      Allergies:  Allergies  Allergen Reactions   Flagyl [Metronidazole] Shortness Of Breath and Swelling   Lyrica [Pregabalin] Shortness Of Breath   Adhesive [Tape] Other (See Comments)    Takes off patient's skin   Aspirin Other (See Comments)    Stomach bleed   Latex Itching and Rash   Phenergan [Promethazine Hcl] Other (See Comments)    Caused grogginess, altered mental status   Prednisone Anxiety   Sulfa Antibiotics Nausea Only   Tetanus Toxoids Swelling    Arm Area    Family History:  Family History  Problem Relation Age of Onset   Uterine cancer Mother    Parkinson's disease Father    Stroke Father    Colon cancer Brother    Lung cancer Brother    COPD Sister    Diabetes Grandchild    Asthma Son     Social History:  Social History   Tobacco Use   Smoking status: Never   Smokeless tobacco: Never  Vaping Use   Vaping Use: Never used  Substance Use Topics   Alcohol use: No    Alcohol/week: 0.0 standard drinks   Drug use: No    ROS: Constitutional:  Negative for fever, chills, weight loss CV: Negative for chest pain, previous MI, hypertension Respiratory:  Negative for shortness of breath, wheezing, sleep apnea, frequent cough GI:  Negative for nausea, vomiting, bloody stool, GERD  Physical exam: There were no vitals taken for this visit. ***  Results: ***

## 2021-03-01 ENCOUNTER — Encounter: Payer: Self-pay | Admitting: *Deleted

## 2021-03-06 ENCOUNTER — Ambulatory Visit: Payer: Medicare Other | Admitting: Psychiatry

## 2021-03-06 ENCOUNTER — Encounter: Payer: Self-pay | Admitting: Psychiatry

## 2021-03-06 NOTE — Progress Notes (Deleted)
[facility]  PATIENT: Heather Espinoza DOB: 1945/06/22  REFERRING CLINICIAN: London Pepper, MD HISTORY FROM: *** REASON FOR VISIT: memory loss   HISTORICAL  CHIEF COMPLAINT:  No chief complaint on file.   HISTORY OF PRESENT ILLNESS:  The patient presents for evaluation of memory loss which has been present since***  The patient was recently admitted to the hospital from 10/25-10/28 for sepsis and metabolic encephalopathy. [facility] 01/11/21 showed mild cerebral atrophy with no acute process.  TBI: *** No past history of TBI Stroke: *** no past history of stroke Seizures: *** no past history of seizures Sleep: *** no history of sleep apnea.  Has *** never had sleep study.  STOP BANG score *** Mood: *** patient denies anxiety and depression  Functional status: independent in all ** ADLs and IADLs Patient lives with *** in a *** with *** stairs. Cooking: *** Cleaning: *** Shopping: *** Bathing: *** Toileting: *** Driving: *** Bills: *** Medications: *** Ever left the stove on by accident?: *** Forget how to use items around the house?: *** Getting lost going to familiar places?: *** Forgetting loved ones names?: *** Word finding difficulty? *** Sleep: ***  OTHER MEDICAL CONDITIONS: essential tremor, kidney stones, OSA, HTN, HLD, hypothyroidism   REVIEW OF SYSTEMS: Full 14 system review of systems performed and negative with exception of: ***  ALLERGIES: Allergies  Allergen Reactions   Flagyl [Metronidazole] Shortness Of Breath and Swelling   Lyrica [Pregabalin] Shortness Of Breath   Adhesive [Tape] Other (See Comments)    Takes off patient's skin   Aspirin Other (See Comments)    Stomach bleed   Latex Itching and Rash   Phenergan [Promethazine Hcl] Other (See Comments)    Caused grogginess, altered mental status   Prednisone Anxiety   Sulfa Antibiotics Nausea Only   Tetanus Toxoids Swelling    Arm Area    HOME MEDICATIONS: Outpatient  Medications Prior to Visit  Medication Sig Dispense Refill   acetaminophen (TYLENOL) 500 MG tablet Take 1,000 mg by mouth every 6 (six) hours as needed for moderate pain.     albuterol (PROVENTIL HFA;VENTOLIN HFA) 108 (90 Base) MCG/ACT inhaler Inhale 2 puffs into the lungs every 6 (six) hours as needed for wheezing or shortness of breath.     cephALEXin (KEFLEX) 250 MG capsule Take 1 capsule (250 mg total) by mouth daily. 30 capsule 1   Cyanocobalamin (B-12) 2500 MCG TABS Take 2,500 mcg by mouth daily.     dicyclomine (BENTYL) 10 MG capsule Take 1 capsule (10 mg total) by mouth 2 (two) times daily before a meal. (Patient taking differently: Take 10 mg by mouth 3 (three) times daily before meals.) 180 capsule 1   docusate sodium (COLACE) 100 MG capsule Take 2 capsules (200 mg total) by mouth at bedtime. 10 capsule 0   DULoxetine (CYMBALTA) 30 MG capsule Take 30 mg by mouth daily.     esomeprazole (NEXIUM) 40 MG capsule Take 1 capsule (40 mg total) by mouth daily before breakfast. 90 capsule 1   famotidine (PEPCID) 40 MG tablet Take 1 tablet (40 mg total) by mouth at bedtime. 90 tablet 1   Fluticasone-Salmeterol (ADVAIR) 250-50 MCG/DOSE AEPB Inhale 1 puff into the lungs every 12 (twelve) hours.     gabapentin (NEURONTIN) 100 MG capsule Take 100 mg by mouth 3 (three) times daily.     HYDROcodone-acetaminophen (NORCO/VICODIN) 5-325 MG tablet Take 1 tablet by mouth every 6 (six) hours as needed for moderate pain. 15  tablet 0   levothyroxine (SYNTHROID, LEVOTHROID) 75 MCG tablet Take 75 mcg by mouth daily before breakfast.     lisinopril (PRINIVIL,ZESTRIL) 10 MG tablet Take 10 mg by mouth in the morning.     lubiprostone (AMITIZA) 8 MCG capsule Take 8 mcg by mouth 2 (two) times daily with a meal.     metFORMIN (GLUCOPHAGE-XR) 500 MG 24 hr tablet Take 500 mg by mouth daily with supper.     mupirocin ointment (BACTROBAN) 2 % Apply 1 application topically 2 (two) times daily.      neomycin-bacitracin-polymyxin (NEOSPORIN) ointment Apply 1 application topically as needed for wound care.     ondansetron (ZOFRAN) 4 MG tablet TAKE 1 TABLET BY MOUTH TWICE DAILY AS NEEDED FOR NAUSEA OR VOMITING 30 tablet 1   simvastatin (ZOCOR) 20 MG tablet Take 20 mg by mouth every evening.     traZODone (DESYREL) 100 MG tablet Take 50-100 mg by mouth at bedtime.     No facility-administered medications prior to visit.    PAST MEDICAL HISTORY: Past Medical History:  Diagnosis Date   Anemia    Arthritis    COPD (chronic obstructive pulmonary disease) ([place])    COVID-19 11/11/2020   Diabetes ([place])    Diverticulitis    Diverticulosis    Dyspnea    Fibromyalgia    High cholesterol    History of kidney stones    Hypertension    Hypothyroid    IBS (irritable bowel syndrome)    Osteopenia    Pneumonia    Restless leg    Sleep apnea    Doesn't use CPAP.     Tachycardia    per pt/fim   Tremor, essential 01/07/2018    PAST SURGICAL HISTORY: Past Surgical History:  Procedure Laterality Date   ABDOMINAL HYSTERECTOMY     APPENDECTOMY     BACK SURGERY     BIOPSY  12/25/2016   Procedure: BIOPSY;  Surgeon: Rogene Houston, MD;  [facility];  Service: Endoscopy;;  gastric    BREAST LUMPECTOMY Right 03/20/1999   CATARACT EXTRACTION Bilateral 03/19/2014   CHOLECYSTECTOMY     COLONOSCOPY N/A 12/17/2012   Procedure: COLONOSCOPY;  Surgeon: Rogene Houston, MD;  [facility];  Service: Endoscopy;  Laterality: N/A;  215   COLONOSCOPY WITH PROPOFOL N/A 07/24/2019   Procedure: COLONOSCOPY WITH PROPOFOL;  Surgeon: Rogene Houston, MD;  [facility];  Service: Endoscopy;  Laterality: N/A;  730   complete hysterectomy     DRUG INDUCED ENDOSCOPY N/A 09/02/2020   Procedure: DRUG INDUCED ENDOSCOPY;  Surgeon: Jerrell Belfast, MD;  Location: [place];  Service: ENT;  Laterality: N/A;   ESOPHAGEAL DILATION N/A 12/25/2016   Procedure:  ESOPHAGEAL DILATION;  Surgeon: Rogene Houston, MD;  [facility];  Service: Endoscopy;  Laterality: N/A;   ESOPHAGEAL DILATION N/A 07/24/2019   Procedure: ESOPHAGEAL DILATION;  Surgeon: Rogene Houston, MD;  [facility];  Service: Endoscopy;  Laterality: N/A;   ESOPHAGOGASTRODUODENOSCOPY N/A 12/25/2016   Procedure: ESOPHAGOGASTRODUODENOSCOPY (EGD);  Surgeon: Rogene Houston, MD;  [facility];  Service: Endoscopy;  Laterality: N/A;  730   ESOPHAGOGASTRODUODENOSCOPY (EGD) WITH PROPOFOL N/A 07/24/2019   Procedure: ESOPHAGOGASTRODUODENOSCOPY (EGD) WITH PROPOFOL;  Surgeon: Rogene Houston, MD;  [facility];  Service: Endoscopy;  Laterality: N/A;   EXTRACORPOREAL SHOCK WAVE LITHOTRIPSY Left 02/21/2021   Procedure: EXTRACORPOREAL SHOCK WAVE LITHOTRIPSY (ESWL);  Surgeon: Michaelle Birks  J., MD;  [facility];  Service: Urology;  Laterality: Left;   Heel tumor removed     IMPLANTATION OF HYPOGLOSSAL NERVE STIMULATOR Right 11/25/2020   Procedure: IMPLANTATION OF HYPOGLOSSAL NERVE STIMULATOR;  Surgeon: Jerrell Belfast, MD;  Location: [place];  Service: ENT;  Laterality: Right;   POLYPECTOMY  07/24/2019   Procedure: POLYPECTOMY;  Surgeon: Rogene Houston, MD;  [facility];  Service: Endoscopy;;   rt elbow surgery     THUMB FUSION Bilateral    TONSILLECTOMY      FAMILY HISTORY: Family History  Problem Relation Age of Onset   Uterine cancer Mother    Parkinson's disease Father    Stroke Father    Colon cancer Brother    Lung cancer Brother    COPD Sister    Diabetes Grandchild    Asthma Son     SOCIAL HISTORY: Social History   Socioeconomic History   Marital status: Married    Spouse name: Not on file   Number of children: 2   Years of education: College   Highest education level: Associate degree: academic program  Occupational History   Occupation: Retired  Tobacco Use   Smoking status: Never   Smokeless tobacco:  Never  Scientific laboratory technician Use: Never used  Substance and Sexual Activity   Alcohol use: No    Alcohol/week: 0.0 standard drinks   Drug use: Never   Sexual activity: Not Currently  Other Topics Concern   Not on file  Social History Narrative   Lives at home w/ her husband   Right-handed   Occasional caffeine: decaf tea, diet sodas 2 daily   Social Determinants of Health   Financial Resource Strain: Not on file  Food Insecurity: Not on file  Transportation Needs: Not on file  Physical Activity: Not on file  Stress: Not on file  Social Connections: Not on file  Intimate Partner Violence: Not on file     PHYSICAL EXAM ***  GENERAL EXAM/CONSTITUTIONAL: Vitals: There were no vitals filed for this visit. There is no height or weight on file to calculate BMI. Wt Readings from Last 3 Encounters:  02/21/21 119 lb 14.9 oz (54.4 kg)  02/13/21 120 lb (54.4 kg)  01/12/21 128 lb 1.4 oz (58.1 kg)   Patient is in no distress; well developed, nourished and groomed; neck is supple  CARDIOVASCULAR: Examination of carotid arteries is normal; no carotid bruits Regular rate and rhythm, no murmurs Examination of peripheral vascular system by observation and palpation is normal  EYES: Pupils round and reactive to light, Visual fields full to confrontation, Extraocular movements intacts,   MUSCULOSKELETAL: Gait, strength, tone, movements noted in Neurologic exam below  NEUROLOGIC: MENTAL STATUS:  No flowsheet data found. awake, alert, oriented to person, place and time recent and remote memory intact normal attention and concentration language fluent, comprehension intact, naming intact fund of knowledge appropriate  CRANIAL NERVE:  2nd - no papilledema or hemorrhages on fundoscopic exam 2nd, 3rd, 4th, 6th - pupils equal and reactive to light, visual fields full to confrontation, extraocular muscles intact, no nystagmus 5th - facial sensation symmetric 7th - facial strength  symmetric 8th - hearing intact 9th - palate elevates symmetrically, uvula midline 11th - shoulder shrug symmetric 12th - tongue protrusion midline  MOTOR:  normal bulk and tone, no cogwheeling, full strength in the BUE, BLE  SENSORY:  normal and symmetric to light touch, pinprick, temperature, vibration  COORDINATION:  finger-nose-finger, fine finger  movements normal, no tremor  REFLEXES:  deep tendon reflexes present and symmetric  GAIT/STATION:  normal     DIAGNOSTIC DATA (LABS, IMAGING, TESTING) - I reviewed patient records, labs, notes, testing and imaging myself where available.  Lab Results  Component Value Date   WBC 10.9 (H) 01/13/2021   HGB 8.1 (L) 01/13/2021   HCT 24.7 (L) 01/13/2021   MCV 98.8 01/13/2021   PLT 340 01/13/2021      Component Value Date/Time   NA 139 01/13/2021 0449   K 3.8 01/13/2021 0449   CL 109 01/13/2021 0449   CO2 23 01/13/2021 0449   GLUCOSE 111 (H) 01/13/2021 0449   BUN 9 01/13/2021 0449   CREATININE 1.00 01/13/2021 0449   CALCIUM 8.2 (L) 01/13/2021 0449   PROT 5.7 (L) 01/12/2021 0532   PROT 6.6 05/09/2018 1511   ALBUMIN 2.5 (L) 01/12/2021 0532   ALBUMIN 4.2 05/09/2018 1511   AST 13 (L) 01/12/2021 0532   ALT 8 01/12/2021 0532   ALKPHOS 42 01/12/2021 0532   BILITOT 0.5 01/12/2021 0532   BILITOT 0.3 05/09/2018 1511   GFRNONAA 59 (L) 01/13/2021 0449   GFRAA 45 (L) 02/05/2019 1008   Lab Results  Component Value Date   CHOL 129 05/09/2018   HDL 53 05/09/2018   LDLCALC 49 05/09/2018   TRIG 134 05/09/2018   CHOLHDL 2.4 05/09/2018   Lab Results  Component Value Date   HGBA1C 6.0 (H) 01/11/2021   Lab Results  Component Value Date   [id number] 798 01/13/2021   Lab Results  Component Value Date   TSH 1.340 05/09/2018    ***    ASSESSMENT AND PLAN  75 y.o. year old female with ***   No diagnosis found.    PLAN: - Labs: CBC, CMP, TSH, B12, RPR  - MRI brain to assess for signs of neurodegeneration and/or  significant vascular disease.  - Will place referral for neuropsychological testing to better characterize his/her reported deficits and to establish a cognitive baseline.  - Referral placed for cognitive rehabilitation to help with some of his/her issues.  - Follow up after testing is complete.   No orders of the defined types were placed in this encounter.   No orders of the defined types were placed in this encounter.   No follow-ups on file.  I spent an average of *** chart reviewing and counseling the patient, with at least 50% of the time face to face with the patient. General brain health measures discussed, including the importance of regular aerobic exercise. Reviewed safety measures including driving safety.   Genia Harold, MD  [facility] [address] [place], [place] [address] [phone]

## 2021-03-21 ENCOUNTER — Encounter (HOSPITAL_BASED_OUTPATIENT_CLINIC_OR_DEPARTMENT_OTHER): Payer: Medicare Other | Admitting: Internal Medicine

## 2021-04-02 ENCOUNTER — Other Ambulatory Visit (INDEPENDENT_AMBULATORY_CARE_PROVIDER_SITE_OTHER): Payer: Self-pay | Admitting: Internal Medicine

## 2021-04-02 DIAGNOSIS — K582 Mixed irritable bowel syndrome: Secondary | ICD-10-CM

## 2021-04-03 NOTE — Telephone Encounter (Signed)
Last seen 09/27/20 and has appt on 1/17

## 2021-04-04 ENCOUNTER — Encounter (INDEPENDENT_AMBULATORY_CARE_PROVIDER_SITE_OTHER): Payer: Self-pay | Admitting: Internal Medicine

## 2021-04-04 ENCOUNTER — Ambulatory Visit (INDEPENDENT_AMBULATORY_CARE_PROVIDER_SITE_OTHER): Payer: Medicare Other | Admitting: Internal Medicine

## 2021-04-04 ENCOUNTER — Other Ambulatory Visit: Payer: Self-pay

## 2021-04-04 ENCOUNTER — Ambulatory Visit (HOSPITAL_COMMUNITY)
Admission: RE | Admit: 2021-04-04 | Discharge: 2021-04-04 | Disposition: A | Discharge status: Home / Self Care | Payer: Medicare Other | Source: Ambulatory Visit | Attending: Urology | Admitting: Urology

## 2021-04-04 VITALS — BP 114/67 | HR 79 | Temp 98.9°F | Ht 61.5 in | Wt 117.8 lb

## 2021-04-04 DIAGNOSIS — R1319 Other dysphagia: Secondary | ICD-10-CM

## 2021-04-04 DIAGNOSIS — K219 Gastro-esophageal reflux disease without esophagitis: Secondary | ICD-10-CM | POA: Diagnosis not present

## 2021-04-04 DIAGNOSIS — N200 Calculus of kidney: Secondary | ICD-10-CM | POA: Insufficient documentation

## 2021-04-04 DIAGNOSIS — D509 Iron deficiency anemia, unspecified: Secondary | ICD-10-CM

## 2021-04-04 DIAGNOSIS — K582 Mixed irritable bowel syndrome: Secondary | ICD-10-CM

## 2021-04-04 DIAGNOSIS — R634 Abnormal weight loss: Secondary | ICD-10-CM

## 2021-04-04 LAB — CBC WITH DIFFERENTIAL/PLATELET
Absolute Monocytes: 766 cells/uL (ref 200–950)
Basophils Absolute: 87 cells/uL (ref 0–200)
Basophils Relative: 1.1 %
Eosinophils Absolute: 300 cells/uL (ref 15–500)
Eosinophils Relative: 3.8 %
HCT: 33.2 % — ABNORMAL LOW (ref 35.0–45.0)
Hemoglobin: 10.8 g/dL — ABNORMAL LOW (ref 11.7–15.5)
Lymphs Abs: 2560 cells/uL (ref 850–3900)
MCH: 30.2 pg (ref 27.0–33.0)
MCHC: 32.5 g/dL (ref 32.0–36.0)
MCV: 92.7 fL (ref 80.0–100.0)
MPV: 10.1 fL (ref 7.5–12.5)
Monocytes Relative: 9.7 %
Neutro Abs: 4187 cells/uL (ref 1500–7800)
Neutrophils Relative %: 53 %
Platelets: 381 10*3/uL (ref 140–400)
RBC: 3.58 10*6/uL — ABNORMAL LOW (ref 3.80–5.10)
RDW: 12.3 % (ref 11.0–15.0)
Total Lymphocyte: 32.4 %
WBC: 7.9 10*3/uL (ref 3.8–10.8)

## 2021-04-04 MED ORDER — ESOMEPRAZOLE MAGNESIUM 40 MG PO CPDR: 40.0000 mg | DELAYED_RELEASE_CAPSULE | Freq: Every day | ORAL | 3 refills | Status: DC

## 2021-04-04 MED ORDER — FERROUS FUMARATE 324 (106 FE) MG PO TABS: 1.0000 | ORAL_TABLET | Freq: Every day | ORAL | 1 refills | Status: DC

## 2021-04-04 NOTE — Progress Notes (Signed)
Presenting complaint;  Follow-up for GERD IBS nausea. Patient now complains of dysphagia and weight loss.  Database and subjective:  Patient is 76 year old Caucasian female with multiple medical problems and GI issues is here for scheduled visit.  She was last seen in July 2022. She was admitted to [facility] in October 2022 and discharged 3 days later.  She apparently developed acute mental status changes secondary to sepsis/UTI secondary to E. coli.  Patient says she has not felt since then.  She has frequent nausea without vomiting.  She remains with irregular bowel movements.  She generally has 2-3 bowel movements per day.  Stool consistency varies from loose slimy stool to normal to hard stool.  He denies melena or rectal bleeding.  Her appetite is poor.  She eats 1 meal a day and she may have some snacks.  She has lost 9 pounds since her last visit of July 2022.  She says heartburn is well controlled with PPI but now she is complaining of dysphagia which is experienced with pills as well as solids particularly meats.  She points to upper sternal area as site of bolus obstruction.  She underwent esophageal dilation in May 2021 and she says it helped her for 1 year.  She eats slowly and Sater sure food good.  She has not had any episode of food impaction. She continues to complain of nausea but it does not occur every day.  Ondansetron is helping. She had inspire implant in September 2022 for obstructive sleep apnea.  She says she still has soreness at entry site. She takes gabapentin for neuropathy in her feet.  Current Medications: Outpatient Encounter Medications as of 04/04/2021  Medication Sig   acetaminophen (TYLENOL) 500 MG tablet Take 1,000 mg by mouth every 6 (six) hours as needed for moderate pain.   albuterol (PROVENTIL HFA;VENTOLIN HFA) 108 (90 Base) MCG/ACT inhaler Inhale 2 puffs into the lungs every 6 (six) hours as needed for wheezing or shortness of breath.   cephALEXin  (KEFLEX) 250 MG capsule Take 1 capsule (250 mg total) by mouth daily.   Cyanocobalamin (B-12) 2500 MCG TABS Take 2,500 mcg by mouth daily.   dicyclomine (BENTYL) 10 MG capsule Take 1 capsule (10 mg total) by mouth 2 (two) times daily before a meal. (Patient taking differently: Take 10 mg by mouth 2 (two) times daily.)   docusate sodium (COLACE) 100 MG capsule Take 2 capsules (200 mg total) by mouth at bedtime.   DULoxetine (CYMBALTA) 30 MG capsule Take 30 mg by mouth daily.   esomeprazole (NEXIUM) 40 MG capsule Take 1 capsule (40 mg total) by mouth daily before breakfast.   famotidine (PEPCID) 40 MG tablet Take 1 tablet (40 mg total) by mouth at bedtime.   Fluticasone-Salmeterol (ADVAIR) 250-50 MCG/DOSE AEPB Inhale 1 puff into the lungs every 12 (twelve) hours.   gabapentin (NEURONTIN) 100 MG capsule Take 100 mg by mouth 3 (three) times daily.   HYDROcodone-acetaminophen (NORCO/VICODIN) 5-325 MG tablet Take 1 tablet by mouth every 6 (six) hours as needed for moderate pain.   levothyroxine (SYNTHROID, LEVOTHROID) 75 MCG tablet Take 75 mcg by mouth daily before breakfast.   lisinopril (PRINIVIL,ZESTRIL) 10 MG tablet Take 10 mg by mouth in the morning.   METAMUCIL FIBER PO Take by mouth. Gummies 1 -3 a day as needed   metFORMIN (GLUCOPHAGE-XR) 500 MG 24 hr tablet Take 500 mg by mouth daily with supper.   mupirocin ointment (BACTROBAN) 2 % Apply 1 application topically 2 (  two) times daily.   neomycin-bacitracin-polymyxin (NEOSPORIN) ointment Apply 1 application topically as needed for wound care.   ondansetron (ZOFRAN) 4 MG tablet TAKE 1 TABLET BY MOUTH TWICE DAILY AS NEEDED FOR NAUSEA OR VOMITING   simvastatin (ZOCOR) 20 MG tablet Take 20 mg by mouth every evening.   traZODone (DESYREL) 100 MG tablet Take 50-100 mg by mouth at bedtime.   [DISCONTINUED] lubiprostone (AMITIZA) 8 MCG capsule Take 8 mcg by mouth 2 (two) times daily with a meal. (Patient not taking: Reported on 04/04/2021)   No  facility-administered encounter medications on file as of 04/04/2021.     Objective: Blood pressure 114/67, pulse 79, temperature 98.9 F (37.2 C), temperature source Oral, height 5' 1.5" (1.562 m), weight 117 lb 12.8 oz (53.4 kg). Patient is alert and in no acute distress. Conjunctiva is pink. Sclera is nonicteric Oropharyngeal mucosa is dry. No neck masses or thyromegaly noted. Cardiac exam with regular rhythm normal S1 and S2. No murmur or gallop noted. Lungs are clear to auscultation. Abdomen is symmetrical.  Bowel sounds are normal.  On palpation abdomen is soft.  She has mild tenderness across lower abdomen.  No organomegaly or masses. No LE edema or clubbing noted.  Labs/studies Results:   CBC Latest Ref Rng & Units 01/13/2021 01/12/2021 01/10/2021  WBC 4.0 - 10.5 K/uL 10.9(H) 15.7(H) 27.4(H)  Hemoglobin 12.0 - 15.0 g/dL 8.1(L) 8.2(L) 11.4(L)  Hematocrit 36.0 - 46.0 % 24.7(L) 25.4(L) 34.2(L)  Platelets 150 - 400 K/uL 340 326 526(H)    CMP Latest Ref Rng & Units 01/13/2021 01/12/2021 01/11/2021  Glucose 70 - 99 mg/dL 111(H) 131(H) 134(H)  BUN 8 - 23 mg/dL _0 Creatinine 0.44 - 1.00 mg/dL 1.00 1.05(H) 1.28(H)  Sodium 135 - 145 mmol/L 139 136 135  Potassium 3.5 - 5.1 mmol/L 3.8 4.0 3.9  Chloride 98 - 111 mmol/L 109 108 104  CO2 22 - 32 mmol/L 23 20(L) 23  Calcium 8.9 - 10.3 mg/dL 8.2(L) 7.7(L) 8.3(L)  Total Protein 6.5 - 8.1 g/dL - 5.7(L) -  Total Bilirubin 0.3 - 1.2 mg/dL - 0.5 -  Alkaline Phos 38 - 126 U/L - 42 -  AST 15 - 41 U/L - 13(L) -  ALT 0 - 44 U/L - 8 -    Hepatic Function Latest Ref Rng & Units 01/12/2021 01/10/2021 07/30/2020  Total Protein 6.5 - 8.1 g/dL 5.7(L) 7.5 6.9  Albumin 3.5 - 5.0 g/dL 2.5(L) 3.6 3.7  AST 15 - 41 U/L 13(L) 20 26  ALT 0 - 44 U/L _1 Alk Phosphatase 38 - 126 U/L 42 53 40  Total Bilirubin 0.3 - 1.2 mg/dL 0.5 0.9 0.5  Bilirubin, Direct 0.00 - 0.40 mg/dL - - -      Assessment:  #1.  Esophageal dysphagia.  She has a  history of esophageal valve noted on EGD of May 2021.  She also has small sliding hiatal hernia.  She did respond to esophageal dilation.  Remains to be seen if valve has recurred or if she has esophageal motility disorder.  She will be further evaluated barium pill esophagogram gram.  #2.  Chronic GERD.  GERD symptoms are well controlled with therapy.  Will hold off making any changes in acid suppression at this time given ongoing issues.  #3.  IBS.  She has typical symptoms.  However she continues to lose weight may consider ruling out malabsorption EPI.  #4.  Iron deficiency anemia.  Her hemoglobin on 01/13/2021  was 8.1.  She needs to be on iron supplement.  She possibly does not absorb iron given chronic acid suppression.  #5.  Weight loss.  Weight loss most likely secondary to recent hospitalization for sepsis.  We will continue to monitor her weight.   Plan:  Hemoccult x1. Barium pill esophagogram. CBC today. Ferrous fumarate 324 mg by mouth daily with breakfast. Patient encouraged to eat 3 meals even though they are small and she should also eat at least 2 snacks per day. Dicyclomine 10 mg by mouth before breakfast and lunch daily. Ondansetron and esomeprazole prescriptions filled. Office visit in 3 months.

## 2021-04-04 NOTE — Patient Instructions (Signed)
Keep stool diary as to frequency and consistency of stool for the next 2 weeks Physician will call results of barium study when completed. Hemoccult x1.

## 2021-04-04 NOTE — Telephone Encounter (Signed)
Also requesting nexium

## 2021-04-10 ENCOUNTER — Ambulatory Visit (INDEPENDENT_AMBULATORY_CARE_PROVIDER_SITE_OTHER): Payer: Medicare Other | Admitting: Urology

## 2021-04-10 ENCOUNTER — Encounter: Payer: Self-pay | Admitting: Urology

## 2021-04-10 VITALS — BP 140/66 | HR 83 | Ht 61.0 in | Wt 117.0 lb

## 2021-04-10 DIAGNOSIS — R8290 Unspecified abnormal findings in urine: Secondary | ICD-10-CM

## 2021-04-10 DIAGNOSIS — N3941 Urge incontinence: Secondary | ICD-10-CM | POA: Diagnosis not present

## 2021-04-10 DIAGNOSIS — N200 Calculus of kidney: Secondary | ICD-10-CM

## 2021-04-10 DIAGNOSIS — Z87440 Personal history of urinary (tract) infections: Secondary | ICD-10-CM

## 2021-04-10 LAB — BLADDER SCAN AMB NON-IMAGING: Scan Result: 10

## 2021-04-10 MED ORDER — GEMTESA 75 MG PO TABS
75.0000 mg | ORAL_TABLET | Freq: Every day | ORAL | 0 refills | Status: DC
Start: 2021-04-10 — End: 2021-05-19

## 2021-04-10 NOTE — Progress Notes (Signed)
post void residual =10   Urological Symptom Review  Patient is experiencing the following symptoms: Frequent urination Burning/pain with urination   Review of Systems  Gastrointestinal (upper)  : Nausea  Gastrointestinal (lower) : Diarrhea  Constitutional : Weight loss  Skin: Negative for skin symptoms  Eyes: Negative for eye symptoms  Ear/Nose/Throat : Negative for Ear/Nose/Throat symptoms  Hematologic/Lymphatic: Negative for Hematologic/Lymphatic symptoms  Cardiovascular : Negative for cardiovascular symptoms  Respiratory : Negative for respiratory symptoms  Endocrine: Negative for endocrine symptoms  Musculoskeletal: Negative for musculoskeletal symptoms  Neurological: Negative for neurological symptoms  Psychologic: Negative for psychiatric symptoms

## 2021-04-10 NOTE — Progress Notes (Signed)
Assessment: 1. Left nephrolithiasis   2. History of UTI   3. Urge incontinence   4. Abnormal urine findings     Plan: I reviewed the KUB from 04/05/2021 with results as noted below. Continue cephalexin 250 mg daily for UTI prevention. Urine culture today Trial of Gemtesa 75 mg daily.  Samples given. Return to office in 1 month Renal U/S prior to next visit  Chief Complaint:  Chief Complaint  Patient presents with   Nephrolithiasis    History of Present Illness:  Heather Espinoza is a 76 y.o. year old female who is seen for further evaluation of a left renal calculus and pyelonephritis.  She was recently admitted to the hospital in October 2022 for pyelonephritis.  She has a history of recurrent UTIs getting 3-4 UTIs per year.  CT scan obtained during the hospital admission showed a 1 cm left lower pole calculus with mild fullness of the lower pole.  Urine culture grew E. coli.  He was treated with culture appropriate antibiotics.  She was seen by Dr. Alyson Ingles while in the hospital.  Treatment options for the left renal calculus were discussed including ureteroscopy and shockwave lithotripsy. She completed her antibiotics.  She was not having any UTI symptoms at her visit on 01/30/21.  No flank pain.  No dysuria or gross hematuria. Urine culture from 01/30/21 grew >100 K E. Coli.  Treated with Cefdinir x 7 days. KUB from 01/30/21 showed 9 mm calcification in lower left renal shadow.  She underwent left ESL on 02/21/2021. KUB from 04/04/2021 showed the left lower pole calcification decreased in size to approximately 6 mm. She has done well since the procedure.  No flank pain.  She is not aware of passing any large fragments.  No dysuria or gross hematuria.  She continues on daily cephalexin. She does report frequency and urgency with episodes of urge incontinence.  No current medical therapy.  Portions of the above documentation were copied from a prior visit for review purposes  only.   Past Medical History:  Past Medical History:  Diagnosis Date   Anemia    Arthritis    COPD (chronic obstructive pulmonary disease) ([place])    COVID-19 11/11/2020   Diabetes ([place])    Diverticulitis    Diverticulosis    Dyspnea    Fibromyalgia    High cholesterol    History of kidney stones    Hypertension    Hypothyroid    IBS (irritable bowel syndrome)    Osteopenia    Pneumonia    Restless leg    Sleep apnea    Doesn't use CPAP.     Tachycardia    per pt/fim   Tremor, essential 01/07/2018    Past Surgical History:  Past Surgical History:  Procedure Laterality Date   ABDOMINAL HYSTERECTOMY     APPENDECTOMY     BACK SURGERY     BIOPSY  12/25/2016   Procedure: BIOPSY;  Surgeon: Rogene Houston, MD;  [facility];  Service: Endoscopy;;  gastric    BREAST LUMPECTOMY Right 03/20/1999   CATARACT EXTRACTION Bilateral 03/19/2014   CHOLECYSTECTOMY     COLONOSCOPY N/A 12/17/2012   Procedure: COLONOSCOPY;  Surgeon: Rogene Houston, MD;  [facility];  Service: Endoscopy;  Laterality: N/A;  215   COLONOSCOPY WITH PROPOFOL N/A 07/24/2019   Procedure: COLONOSCOPY WITH PROPOFOL;  Surgeon: Rogene Houston, MD;  [facility];  Service: Endoscopy;  Laterality: N/A;  730  complete hysterectomy     DRUG INDUCED ENDOSCOPY N/A 09/02/2020   Procedure: DRUG INDUCED ENDOSCOPY;  Surgeon: Jerrell Belfast, MD;  Location: [place];  Service: ENT;  Laterality: N/A;   ESOPHAGEAL DILATION N/A 12/25/2016   Procedure: ESOPHAGEAL DILATION;  Surgeon: Rogene Houston, MD;  [facility];  Service: Endoscopy;  Laterality: N/A;   ESOPHAGEAL DILATION N/A 07/24/2019   Procedure: ESOPHAGEAL DILATION;  Surgeon: Rogene Houston, MD;  [facility];  Service: Endoscopy;  Laterality: N/A;   ESOPHAGOGASTRODUODENOSCOPY N/A 12/25/2016   Procedure: ESOPHAGOGASTRODUODENOSCOPY (EGD);  Surgeon: Rogene Houston, MD;  [facility];  Service: Endoscopy;  Laterality: N/A;  730   ESOPHAGOGASTRODUODENOSCOPY (EGD) WITH PROPOFOL N/A 07/24/2019   Procedure: ESOPHAGOGASTRODUODENOSCOPY (EGD) WITH PROPOFOL;  Surgeon: Rogene Houston, MD;  [facility];  Service: Endoscopy;  Laterality: N/A;   EXTRACORPOREAL SHOCK WAVE LITHOTRIPSY Left 02/21/2021   Procedure: EXTRACORPOREAL SHOCK WAVE LITHOTRIPSY (ESWL);  Surgeon: Primus Bravo., MD;  [facility];  Service: Urology;  Laterality: Left;   Heel tumor removed     IMPLANTATION OF HYPOGLOSSAL NERVE STIMULATOR Right 11/25/2020   Procedure: IMPLANTATION OF HYPOGLOSSAL NERVE STIMULATOR;  Surgeon: Jerrell Belfast, MD;  Location: [place];  Service: ENT;  Laterality: Right;   POLYPECTOMY  07/24/2019   Procedure: POLYPECTOMY;  Surgeon: Rogene Houston, MD;  [facility];  Service: Endoscopy;;   rt elbow surgery     THUMB FUSION Bilateral    TONSILLECTOMY      Allergies:  Allergies  Allergen Reactions   Flagyl [Metronidazole] Shortness Of Breath and Swelling   Lyrica [Pregabalin] Shortness Of Breath   Adhesive [Tape] Other (See Comments)    Takes off patient's skin   Aspirin Other (See Comments)    Stomach bleed   Latex Itching and Rash   Phenergan [Promethazine Hcl] Other (See Comments)    Caused grogginess, altered mental status   Prednisone Anxiety   Sulfa Antibiotics Nausea Only   Tetanus Toxoids Swelling    Arm Area    Family History:  Family History  Problem Relation Age of Onset   Uterine cancer Mother    Parkinson's disease Father    Stroke Father    Colon cancer Brother    Lung cancer Brother    COPD Sister    Diabetes Grandchild    Asthma Son     Social History:  Social History   Tobacco Use   Smoking status: Never   Smokeless tobacco: Never  Vaping Use   Vaping Use: Never used  Substance Use Topics   Alcohol use: No    Alcohol/week: 0.0 standard drinks   Drug use: Never    ROS: Constitutional:  Negative for  fever, chills, weight loss CV: Negative for chest pain, previous MI, hypertension Respiratory:  Negative for shortness of breath, wheezing, sleep apnea, frequent cough GI:  Negative for nausea, vomiting, bloody stool, GERD  Physical exam: BP 140/66 (BP Location: Right Arm)    Pulse 83    Ht 5\' 1"  (1.549 m)    Wt 117 lb (53.1 kg)    BMI 22.11 kg/m  GENERAL APPEARANCE:  Well appearing, well developed, well nourished, NAD HEENT:  Atraumatic, normocephalic, oropharynx clear NECK:  Supple without lymphadenopathy or thyromegaly ABDOMEN:  Soft, non-tender, no masses EXTREMITIES:  Moves all extremities well, without clubbing, cyanosis, or edema NEUROLOGIC:  Alert and oriented x 3, normal gait, CN II-XII grossly intact MENTAL STATUS:  appropriate BACK:  Non-tender to palpation, No CVAT SKIN:  Warm, dry, and intact   Results: U/A:  6-10 WBC, 0-2 RBC, many bacteria  PVR = 10 ml

## 2021-04-11 LAB — URINALYSIS, ROUTINE W REFLEX MICROSCOPIC
Bilirubin, UA: NEGATIVE
Glucose, UA: NEGATIVE
Ketones, UA: NEGATIVE
Nitrite, UA: NEGATIVE
Protein,UA: NEGATIVE
Specific Gravity, UA: 1.02 (ref 1.005–1.030)
Urobilinogen, Ur: 0.2 mg/dL (ref 0.2–1.0)
pH, UA: 5.5 (ref 5.0–7.5)

## 2021-04-11 LAB — MICROSCOPIC EXAMINATION: Renal Epithel, UA: NONE SEEN /hpf

## 2021-04-12 ENCOUNTER — Telehealth: Payer: Self-pay

## 2021-04-12 LAB — URINE CULTURE

## 2021-04-12 NOTE — Telephone Encounter (Signed)
Patient notified. Voiced understanding.

## 2021-04-12 NOTE — Telephone Encounter (Signed)
-----   Message from Primus Bravo, MD sent at 04/12/2021  8:32 AM EST ----- Please notify patient of negative urine culture.  Continue daily cephalexin and keep f/u as scheduled.

## 2021-04-18 ENCOUNTER — Ambulatory Visit (HOSPITAL_COMMUNITY): Payer: Medicare Other

## 2021-04-19 ENCOUNTER — Ambulatory Visit (HOSPITAL_COMMUNITY): Payer: Medicare Other

## 2021-04-26 ENCOUNTER — Encounter (HOSPITAL_BASED_OUTPATIENT_CLINIC_OR_DEPARTMENT_OTHER): Payer: Medicare Other | Admitting: Internal Medicine

## 2021-05-11 ENCOUNTER — Ambulatory Visit: Payer: Medicare Other | Admitting: Urology

## 2021-05-11 ENCOUNTER — Other Ambulatory Visit (INDEPENDENT_AMBULATORY_CARE_PROVIDER_SITE_OTHER): Payer: Self-pay | Admitting: Internal Medicine

## 2021-05-11 DIAGNOSIS — Z87440 Personal history of urinary (tract) infections: Secondary | ICD-10-CM

## 2021-05-11 DIAGNOSIS — R8290 Unspecified abnormal findings in urine: Secondary | ICD-10-CM

## 2021-05-11 DIAGNOSIS — N3941 Urge incontinence: Secondary | ICD-10-CM

## 2021-05-11 DIAGNOSIS — N200 Calculus of kidney: Secondary | ICD-10-CM

## 2021-05-11 NOTE — Telephone Encounter (Signed)
Seen 04/04/21

## 2021-05-11 NOTE — Progress Notes (Unsigned)
Assessment: 1. Left nephrolithiasis   2. History of UTI   3. Urge incontinence   4. Abnormal urine findings     Plan: I reviewed the KUB from 04/05/2021 with results as noted below. Continue cephalexin 250 mg daily for UTI prevention. Urine culture today Trial of Gemtesa 75 mg daily.  Samples given. Return to office in 1 month Renal U/S prior to next visit  Chief Complaint:  No chief complaint on file.   History of Present Illness:  Heather Espinoza is a 76 y.o. year old female who is seen for further evaluation of a left renal calculus and pyelonephritis.  She was recently admitted to the hospital in October 2022 for pyelonephritis.  She has a history of recurrent UTIs getting 3-4 UTIs per year.  CT scan obtained during the hospital admission showed a 1 cm left lower pole calculus with mild fullness of the lower pole.  Urine culture grew E. coli.  He was treated with culture appropriate antibiotics.  She was seen by Dr. Alyson Ingles while in the hospital.  Treatment options for the left renal calculus were discussed including ureteroscopy and shockwave lithotripsy. She completed her antibiotics.  She was not having any UTI symptoms at her visit on 01/30/21.  No flank pain.  No dysuria or gross hematuria. Urine culture from 01/30/21 grew >100 K E. Coli.  Treated with Cefdinir x 7 days. KUB from 01/30/21 showed 9 mm calcification in lower left renal shadow.  She underwent left ESL on 02/21/2021. KUB from 04/04/2021 showed the left lower pole calcification decreased in size to approximately 6 mm. She did well following the procedure.  She was not aware of passing any large stone fragments.  She continued on daily cephalexin for UTI prevention. Urine culture from 1/23 showed <10K colonies. She was given a trial of Gemtesa for symptoms of urge incontinence.   Portions of the above documentation were copied from a prior visit for review purposes only.   Past Medical History:  Past Medical  History:  Diagnosis Date   Anemia    Arthritis    COPD (chronic obstructive pulmonary disease) ([place])    COVID-19 11/11/2020   Diabetes ([place])    Diverticulitis    Diverticulosis    Dyspnea    Fibromyalgia    High cholesterol    History of kidney stones    Hypertension    Hypothyroid    IBS (irritable bowel syndrome)    Osteopenia    Pneumonia    Restless leg    Sleep apnea    Doesn't use CPAP.     Tachycardia    per pt/fim   Tremor, essential 01/07/2018    Past Surgical History:  Past Surgical History:  Procedure Laterality Date   ABDOMINAL HYSTERECTOMY     APPENDECTOMY     BACK SURGERY     BIOPSY  12/25/2016   Procedure: BIOPSY;  Surgeon: Rogene Houston, MD;  [facility];  Service: Endoscopy;;  gastric    BREAST LUMPECTOMY Right 03/20/1999   CATARACT EXTRACTION Bilateral 03/19/2014   CHOLECYSTECTOMY     COLONOSCOPY N/A 12/17/2012   Procedure: COLONOSCOPY;  Surgeon: Rogene Houston, MD;  [facility];  Service: Endoscopy;  Laterality: N/A;  215   COLONOSCOPY WITH PROPOFOL N/A 07/24/2019   Procedure: COLONOSCOPY WITH PROPOFOL;  Surgeon: Rogene Houston, MD;  [facility];  Service: Endoscopy;  Laterality: N/A;  730   complete hysterectomy  DRUG INDUCED ENDOSCOPY N/A 09/02/2020   Procedure: DRUG INDUCED ENDOSCOPY;  Surgeon: Jerrell Belfast, MD;  Location: [place];  Service: ENT;  Laterality: N/A;   ESOPHAGEAL DILATION N/A 12/25/2016   Procedure: ESOPHAGEAL DILATION;  Surgeon: Rogene Houston, MD;  [facility];  Service: Endoscopy;  Laterality: N/A;   ESOPHAGEAL DILATION N/A 07/24/2019   Procedure: ESOPHAGEAL DILATION;  Surgeon: Rogene Houston, MD;  [facility];  Service: Endoscopy;  Laterality: N/A;   ESOPHAGOGASTRODUODENOSCOPY N/A 12/25/2016   Procedure: ESOPHAGOGASTRODUODENOSCOPY (EGD);  Surgeon: Rogene Houston, MD;  [facility];  Service: Endoscopy;  Laterality: N/A;   730   ESOPHAGOGASTRODUODENOSCOPY (EGD) WITH PROPOFOL N/A 07/24/2019   Procedure: ESOPHAGOGASTRODUODENOSCOPY (EGD) WITH PROPOFOL;  Surgeon: Rogene Houston, MD;  [facility];  Service: Endoscopy;  Laterality: N/A;   EXTRACORPOREAL SHOCK WAVE LITHOTRIPSY Left 02/21/2021   Procedure: EXTRACORPOREAL SHOCK WAVE LITHOTRIPSY (ESWL);  Surgeon: Primus Bravo., MD;  [facility];  Service: Urology;  Laterality: Left;   Heel tumor removed     IMPLANTATION OF HYPOGLOSSAL NERVE STIMULATOR Right 11/25/2020   Procedure: IMPLANTATION OF HYPOGLOSSAL NERVE STIMULATOR;  Surgeon: Jerrell Belfast, MD;  Location: [place];  Service: ENT;  Laterality: Right;   POLYPECTOMY  07/24/2019   Procedure: POLYPECTOMY;  Surgeon: Rogene Houston, MD;  [facility];  Service: Endoscopy;;   rt elbow surgery     THUMB FUSION Bilateral    TONSILLECTOMY      Allergies:  Allergies  Allergen Reactions   Flagyl [Metronidazole] Shortness Of Breath and Swelling   Lyrica [Pregabalin] Shortness Of Breath   Adhesive [Tape] Other (See Comments)    Takes off patient's skin   Aspirin Other (See Comments)    Stomach bleed   Latex Itching and Rash   Phenergan [Promethazine Hcl] Other (See Comments)    Caused grogginess, altered mental status   Prednisone Anxiety   Sulfa Antibiotics Nausea Only   Tetanus Toxoids Swelling    Arm Area    Family History:  Family History  Problem Relation Age of Onset   Uterine cancer Mother    Parkinson's disease Father    Stroke Father    Colon cancer Brother    Lung cancer Brother    COPD Sister    Diabetes Grandchild    Asthma Son     Social History:  Social History   Tobacco Use   Smoking status: Never   Smokeless tobacco: Never  Vaping Use   Vaping Use: Never used  Substance Use Topics   Alcohol use: No    Alcohol/week: 0.0 standard drinks   Drug use: Never    ROS: Constitutional:  Negative for fever, chills, weight loss CV: Negative for  chest pain, previous MI, hypertension Respiratory:  Negative for shortness of breath, wheezing, sleep apnea, frequent cough GI:  Negative for nausea, vomiting, bloody stool, GERD  Physical exam: There were no vitals taken for this visit. GENERAL APPEARANCE:  Well appearing, well developed, well nourished, NAD HEENT:  Atraumatic, normocephalic, oropharynx clear NECK:  Supple without lymphadenopathy or thyromegaly ABDOMEN:  Soft, non-tender, no masses EXTREMITIES:  Moves all extremities well, without clubbing, cyanosis, or edema NEUROLOGIC:  Alert and oriented x 3, normal gait, CN II-XII grossly intact MENTAL STATUS:  appropriate BACK:  Non-tender to palpation, No CVAT SKIN:  Warm, dry, and intact   Results: U/A:

## 2021-05-15 ENCOUNTER — Ambulatory Visit (HOSPITAL_COMMUNITY): Payer: Medicare Other

## 2021-05-18 ENCOUNTER — Ambulatory Visit (HOSPITAL_COMMUNITY)
Admission: RE | Admit: 2021-05-18 | Discharge: 2021-05-18 | Disposition: A | Discharge status: Home / Self Care | Payer: Medicare Other | Source: Ambulatory Visit | Attending: Urology | Admitting: Urology

## 2021-05-18 ENCOUNTER — Other Ambulatory Visit: Payer: Self-pay

## 2021-05-18 DIAGNOSIS — N200 Calculus of kidney: Secondary | ICD-10-CM | POA: Diagnosis present

## 2021-05-19 ENCOUNTER — Encounter: Payer: Self-pay | Admitting: Urology

## 2021-05-19 ENCOUNTER — Ambulatory Visit (INDEPENDENT_AMBULATORY_CARE_PROVIDER_SITE_OTHER): Payer: Medicare Other | Admitting: Urology

## 2021-05-19 VITALS — BP 166/87 | HR 59 | Ht 62.0 in | Wt 120.0 lb

## 2021-05-19 DIAGNOSIS — N3941 Urge incontinence: Secondary | ICD-10-CM

## 2021-05-19 DIAGNOSIS — N200 Calculus of kidney: Secondary | ICD-10-CM

## 2021-05-19 DIAGNOSIS — Z87440 Personal history of urinary (tract) infections: Secondary | ICD-10-CM

## 2021-05-19 LAB — URINALYSIS, ROUTINE W REFLEX MICROSCOPIC
Bilirubin, UA: NEGATIVE
Glucose, UA: NEGATIVE
Ketones, UA: NEGATIVE
Nitrite, UA: NEGATIVE
Protein,UA: NEGATIVE
RBC, UA: NEGATIVE
Specific Gravity, UA: 1.015 (ref 1.005–1.030)
Urobilinogen, Ur: 0.2 mg/dL (ref 0.2–1.0)
pH, UA: 6 (ref 5.0–7.5)

## 2021-05-19 LAB — MICROSCOPIC EXAMINATION
RBC, Urine: NONE SEEN /hpf (ref 0–2)
Renal Epithel, UA: NONE SEEN /hpf

## 2021-05-19 MED ORDER — GEMTESA 75 MG PO TABS: 75.0000 mg | ORAL_TABLET | Freq: Every day | ORAL | 11 refills | Status: DC

## 2021-05-19 MED ORDER — CEPHALEXIN 250 MG PO CAPS: 250.0000 mg | ORAL_CAPSULE | Freq: Every day | ORAL | 1 refills | Status: DC

## 2021-05-19 NOTE — Progress Notes (Signed)
? ?Assessment: ?1. Left nephrolithiasis   ?2. History of UTI   ?3. Urge incontinence   ? ? ?Plan: ?Continue cephalexin 250 mg daily for UTI prevention. ?We will continue the antibiotic for 2 months and discontinue shortly before her next visit. ?Continue Gemtesa 75 mg daily.  Samples and rx given. ?Return to office in 3 months ? ?Chief Complaint:  ?Chief Complaint  ?Patient presents with  ? Nephrolithiasis  ? ? ?History of Present Illness: ? ?Heather Espinoza is a 76 y.o. year old female who is seen for further evaluation of a left renal calculus, history of UTI, and urge incontinence.  ?She was admitted to the hospital in October 2022 for pyelonephritis.  She has a history of recurrent UTIs getting 3-4 UTIs per year.  CT scan obtained during the hospital admission showed a 1 cm left lower pole calculus with mild fullness of the lower pole.  Urine culture grew E. coli.  He was treated with culture appropriate antibiotics.  She was seen by Dr. Alyson Ingles while in the hospital.  Treatment options for the left renal calculus were discussed including ureteroscopy and shockwave lithotripsy. ?She completed her antibiotics.  She was not having any UTI symptoms at her visit on 01/30/21.  No flank pain.  No dysuria or gross hematuria. ?Urine culture from 01/30/21 grew >100 K E. Coli.  Treated with Cefdinir x 7 days. ?KUB from 01/30/21 showed 9 mm calcification in lower left renal shadow. ? ?She underwent left ESL on 02/21/2021. ?KUB from 04/04/2021 showed the left lower pole calcification decreased in size to approximately 6 mm. ?She did well following the procedure.  She was not aware of passing any large stone fragments.  She continued on daily cephalexin for UTI prevention. ?Urine culture from 1/23 showed <10K colonies. ?She was given a trial of Gemtesa for symptoms of urge incontinence. ? ?Renal ultrasound from 05/18/2021 showed no evidence of renal mass or hydronephrosis. ? ?She returns today for follow-up.  She continues on  daily cephalexin.  No recent UTI symptoms.  No flank pain.  No dysuria or gross hematuria.  She has seen significant improvement in her urge incontinence with the Gemtesa.  No side effects. ? ?Portions of the above documentation were copied from a prior visit for review purposes only. ? ? ?Past Medical History:  ?Past Medical History:  ?Diagnosis Date  ? Anemia   ? Arthritis   ? COPD (chronic obstructive pulmonary disease) ([place])   ? COVID-19 11/11/2020  ? Diabetes ([place])   ? Diverticulitis   ? Diverticulosis   ? Dyspnea   ? Fibromyalgia   ? High cholesterol   ? History of kidney stones   ? Hypertension   ? Hypothyroid   ? IBS (irritable bowel syndrome)   ? Osteopenia   ? Pneumonia   ? Restless leg   ? Sleep apnea   ? Doesn't use CPAP.    ? Tachycardia   ? per pt/fim  ? Tremor, essential 01/07/2018  ? ? ?Past Surgical History:  ?Past Surgical History:  ?Procedure Laterality Date  ? ABDOMINAL HYSTERECTOMY    ? APPENDECTOMY    ? BACK SURGERY    ? BIOPSY  12/25/2016  ? Procedure: BIOPSY;  Surgeon: Rogene Houston, MD;  [facility];  Service: Endoscopy;;  gastric ?  ? BREAST LUMPECTOMY Right 03/20/1999  ? CATARACT EXTRACTION Bilateral 03/19/2014  ? CHOLECYSTECTOMY    ? COLONOSCOPY N/A 12/17/2012  ? Procedure: COLONOSCOPY;  Surgeon: Rogene Houston,  MD;  [facility];  Service: Endoscopy;  Laterality: N/A;  215  ? COLONOSCOPY WITH PROPOFOL N/A 07/24/2019  ? Procedure: COLONOSCOPY WITH PROPOFOL;  Surgeon: Rogene Houston, MD;  [facility];  Service: Endoscopy;  Laterality: N/A;  730  ? complete hysterectomy    ? DRUG INDUCED ENDOSCOPY N/A 09/02/2020  ? Procedure: DRUG INDUCED ENDOSCOPY;  Surgeon: Jerrell Belfast, MD;  Location: [facility];  Service: ENT;  Laterality: N/A;  ? ESOPHAGEAL DILATION N/A 12/25/2016  ? Procedure: ESOPHAGEAL DILATION;  Surgeon: Rogene Houston, MD;  [facility];  Service: Endoscopy;  Laterality: N/A;  ? ESOPHAGEAL DILATION N/A  07/24/2019  ? Procedure: ESOPHAGEAL DILATION;  Surgeon: Rogene Houston, MD;  [facility];  Service: Endoscopy;  Laterality: N/A;  ? ESOPHAGOGASTRODUODENOSCOPY N/A 12/25/2016  ? Procedure: ESOPHAGOGASTRODUODENOSCOPY (EGD);  Surgeon: Rogene Houston, MD;  [facility];  Service: Endoscopy;  Laterality: N/A;  730  ? ESOPHAGOGASTRODUODENOSCOPY (EGD) WITH PROPOFOL N/A 07/24/2019  ? Procedure: ESOPHAGOGASTRODUODENOSCOPY (EGD) WITH PROPOFOL;  Surgeon: Rogene Houston, MD;  [facility];  Service: Endoscopy;  Laterality: N/A;  ? EXTRACORPOREAL SHOCK WAVE LITHOTRIPSY Left 02/21/2021  ? Procedure: EXTRACORPOREAL SHOCK WAVE LITHOTRIPSY (ESWL);  Surgeon: Primus Bravo., MD;  [facility];  Service: Urology;  Laterality: Left;  ? Heel tumor removed    ? IMPLANTATION OF HYPOGLOSSAL NERVE STIMULATOR Right 11/25/2020  ? Procedure: IMPLANTATION OF HYPOGLOSSAL NERVE STIMULATOR;  Surgeon: Jerrell Belfast, MD;  Location: [facility];  Service: ENT;  Laterality: Right;  ? POLYPECTOMY  07/24/2019  ? Procedure: POLYPECTOMY;  Surgeon: Rogene Houston, MD;  [facility];  Service: Endoscopy;;  ? rt elbow surgery    ? THUMB FUSION Bilateral   ? TONSILLECTOMY    ? ? ?Allergies:  ?Allergies  ?Allergen Reactions  ? Flagyl [Metronidazole] Shortness Of Breath and Swelling  ? Lyrica [Pregabalin] Shortness Of Breath  ? Adhesive [Tape] Other (See Comments)  ?  Takes off patient's skin  ? Aspirin Other (See Comments)  ?  Stomach bleed  ? Phenergan [Promethazine]   ?  Other reaction(s): AMS, grogginess  ? Latex Itching and Rash  ? Phenergan [Promethazine Hcl] Other (See Comments)  ?  Caused grogginess, altered mental status  ? Prednisone Anxiety  ? Sulfa Antibiotics Nausea Only  ? Tetanus Toxoids Swelling  ?  Arm Area  ? Wound Dressing Adhesive Rash  ? ? ?Family History:  ?Family History  ?Problem Relation Age of Onset  ? Uterine cancer Mother   ? Parkinson's disease Father   ? Stroke Father   ?  Colon cancer Brother   ? Lung cancer Brother   ? COPD Sister   ? Diabetes Grandchild   ? Asthma Son   ? ? ?Social History:  ?Social History  ? ?Tobacco Use  ? Smoking status: Never  ? Smokeless tobacco: Never  ?Vaping Use  ? Vaping Use: Never used  ?Substance Use Topics  ? Alcohol use: No  ?  Alcohol/week: 0.0 standard drinks  ? Drug use: Never  ? ? ?ROS: ?Constitutional:  Negative for fever, chills, weight loss ?CV: Negative for chest pain, previous MI, hypertension ?Respiratory:  Negative for shortness of breath, wheezing, sleep apnea, frequent cough ?GI:  Negative for nausea, vomiting, bloody stool, GERD ? ?Physical exam: ?BP (!) 166/87   Pulse (!) 59   Ht 5\' 2"  (1.575 m)   Wt 120 lb (54.4 kg)  BMI 21.95 kg/m?  ?GENERAL APPEARANCE:  Well appearing, well developed, well nourished, NAD ?HEENT:  Atraumatic, normocephalic, oropharynx clear ?NECK:  Supple without lymphadenopathy or thyromegaly ?ABDOMEN:  Soft, non-tender, no masses ?EXTREMITIES:  Moves all extremities well, without clubbing, cyanosis, or edema ?NEUROLOGIC:  Alert and oriented x 3, normal gait, CN II-XII grossly intact ?MENTAL STATUS:  appropriate ?BACK:  Non-tender to palpation, No CVAT ?SKIN:  Warm, dry, and intact ? ? ?Results: ?U/A: 11-30 WBC, few bacteria ?

## 2021-05-29 ENCOUNTER — Ambulatory Visit (HOSPITAL_COMMUNITY): Payer: Medicare Other

## 2021-06-06 ENCOUNTER — Telehealth: Payer: Self-pay

## 2021-06-06 NOTE — Telephone Encounter (Signed)
Patient says the Vibegron (GEMTESA) 75 MG TABS is working great.  She is needing more, but the cost is over $200.   ? ?Please advise. ? ?Call back:  [phone] ? ?Thanks, ?Helene Kelp ? ? ? ? ? ? ? ?

## 2021-06-09 ENCOUNTER — Other Ambulatory Visit: Payer: Self-pay | Admitting: Urology

## 2021-06-09 DIAGNOSIS — N3941 Urge incontinence: Secondary | ICD-10-CM

## 2021-06-09 MED ORDER — TROSPIUM CHLORIDE 20 MG PO TABS: 20.0000 mg | ORAL_TABLET | Freq: Two times a day (BID) | ORAL | 11 refills | Status: DC

## 2021-06-09 NOTE — Telephone Encounter (Signed)
Patient is unable to afford Gemtesa.  ?Can you change medication please?  ? ? ?Will leave samples for patient at desk ( patient request more samples in the meantime)  ?

## 2021-06-10 ENCOUNTER — Other Ambulatory Visit: Payer: Self-pay | Admitting: Urology

## 2021-06-12 NOTE — Telephone Encounter (Signed)
Patient called and notified of new prescription sent to pharmacy ?

## 2021-06-20 ENCOUNTER — Other Ambulatory Visit (INDEPENDENT_AMBULATORY_CARE_PROVIDER_SITE_OTHER): Payer: Self-pay | Admitting: Internal Medicine

## 2021-06-20 DIAGNOSIS — K582 Mixed irritable bowel syndrome: Secondary | ICD-10-CM

## 2021-06-20 NOTE — Telephone Encounter (Signed)
04/04/2021 last seen by Dr. Laural Golden ? ?

## 2021-06-21 ENCOUNTER — Ambulatory Visit (HOSPITAL_BASED_OUTPATIENT_CLINIC_OR_DEPARTMENT_OTHER): Payer: Medicare Other | Attending: Sleep Medicine | Admitting: Sleep Medicine

## 2021-07-03 ENCOUNTER — Encounter (INDEPENDENT_AMBULATORY_CARE_PROVIDER_SITE_OTHER): Payer: Self-pay | Admitting: Gastroenterology

## 2021-07-03 ENCOUNTER — Ambulatory Visit (INDEPENDENT_AMBULATORY_CARE_PROVIDER_SITE_OTHER): Payer: Medicare Other | Admitting: Gastroenterology

## 2021-07-03 VITALS — BP 179/90 | HR 71 | Temp 98.5°F | Ht 62.0 in | Wt 128.4 lb

## 2021-07-03 DIAGNOSIS — R1032 Left lower quadrant pain: Secondary | ICD-10-CM | POA: Diagnosis not present

## 2021-07-03 DIAGNOSIS — K582 Mixed irritable bowel syndrome: Secondary | ICD-10-CM

## 2021-07-03 DIAGNOSIS — D509 Iron deficiency anemia, unspecified: Secondary | ICD-10-CM | POA: Diagnosis not present

## 2021-07-03 DIAGNOSIS — K219 Gastro-esophageal reflux disease without esophagitis: Secondary | ICD-10-CM | POA: Diagnosis not present

## 2021-07-03 DIAGNOSIS — R1319 Other dysphagia: Secondary | ICD-10-CM

## 2021-07-03 MED ORDER — DICYCLOMINE HCL 10 MG PO CAPS: 10.0000 mg | ORAL_CAPSULE | Freq: Three times a day (TID) | ORAL | 1 refills | Status: DC

## 2021-07-03 MED ORDER — FAMOTIDINE 40 MG PO TABS: 40.0000 mg | ORAL_TABLET | Freq: Every day | ORAL | 3 refills | Status: DC

## 2021-07-03 NOTE — Patient Instructions (Signed)
Will recheck blood counts today as well as iron studies ?Refill dicyclomine '10mg'$  sent, you can try taking this before meals, I have sent for three times per day ?Continue nexium '40mg'$  daily ?Continue famotidine '40mg'$  [facility] nightly refill sent ?Will get barium esophogram set up ?Will order CT scan for your abdominal tenderness ?Please follow up with Dr. Felipa Eth regarding fluid retention ? ?

## 2021-07-03 NOTE — Progress Notes (Signed)
? ?Referring Provider: London Pepper, MD ?Primary Care Physician:  London Pepper, MD ?Primary GI Physician: Laural Golden ? ?Chief Complaint  ?Patient presents with  ? Weight Loss  ?  3 month follow up on weight loss, GERD, IDA, IBS, esophageal dysphagia. Patient has concerns about gas, bloating, fluid buildup. Needs refill on bentyl.   ? ?HPI:   ?Heather Espinoza is a 76 y.o. female with past medical history of anemia, COPD, DM, diverticulitis, Fibromyalgia, high cholesterol, HTN, IBS, osteopenia, RLS.  ? ?Patient presenting today for follow up of GERD, IDA, IBS, weight loss and dysphagia. ? ?Last seen January 2023 with nausea/vomiting, irregular BMs, loose to hard. Appetite poor at that time, eating one meal per day. GERD well controlled with PPI but having dysphagia with pills and solids. Had hemoccult, barium pill esophogram, CBC, started on ferrous fumarate '324mg'$  daily, dicyclomine '10mg'$  BID. Hgb 04/04/21 was 10.8, up from 8.1 in Oct 2022. Esophogram has not yet been completed.  ? ?Today, she states that she is doing okay, appetite is so-so, some days she still is not eating very much, she reports that appetite is still very low. She has gained approximately 8 pounds since her last visit but she feels that this may be related to fluid retention in lower legs that she has been noticing over the past week or so since starting [place] for urinary incontinence. She also endorses more gas/bloating with some abdominal cramping and tenderness to lower abdomen over the past few days. Does have hx of diverticulitis and reports that this feels similar to previous episodes. She denies any fevers or chills. She is taking dicyclomine twice a day, once in the morning and one at night. She occasionally takes a third dose, she feels that this helps to calm down her abdominal discomfort for the most part. BMs are still sporadic, some days she may not have a BM, and other days she may have multiple BMs, Saturday she went 6 times, though  stools were not diarrhea at that time, she does continue to alternate between normal stools and diarrhea. Denies nausea or vomiting. GERD is still pretty well controlled on Nexium '40mg'$  once daily. She reports occasional heartburn maybe 3 times since the beginning of the year. She endorses continued occasional dysphagia with pills or thicker foods, they usually stop at the sternal notch. States she never heard anything about scheduling esophagram that was ordered at last visit.  Denies rectal bleeding or melena. She is still taking her iron pill daily, feels that she is tolerating this without issue.  ? ? ?Last Colonoscopy:07/24/19- Decreased sphincter tone found on digital rectal exam. ?- Diverticulosis in the entire examined colon. ?- One 10 mm polyp at the hepatic flexure, removed piecemeal using a cold snare. ?Complete resection. Partial retrieval. Clips (MR conditional) were placed. Tubular adenoma ?- One small polyp at the splenic flexure, removed with a cold snare. Resected and ?retrieved. -benign colonic mucosa ?- External hemorrhoids. ?Last Endoscopy:07/24/19- Normal hypopharynx. ?- Web in the proximal esophagus. ?- Z-line regular, 34 cm from the incisors. ?- 3 cm hiatal hernia. ?- Normal stomach. ?- Duodenal lipoma. ?- Normal second portion of the duodenum. ?- No specimens collected. ? ?Recommendations:  ?Colonoscopy in May 2026 ? ?Past Medical History:  ?Diagnosis Date  ? Anemia   ? Arthritis   ? COPD (chronic obstructive pulmonary disease) ([place])   ? COVID-19 11/11/2020  ? Diabetes ([place])   ? Diverticulitis   ? Diverticulosis   ? Dyspnea   ? Fibromyalgia   ?  High cholesterol   ? History of kidney stones   ? Hypertension   ? Hypothyroid   ? IBS (irritable bowel syndrome)   ? Osteopenia   ? Pneumonia   ? Restless leg   ? Sleep apnea   ? Doesn't use CPAP.    ? Tachycardia   ? per pt/fim  ? Tremor, essential 01/07/2018  ? ? ?Past Surgical History:  ?Procedure Laterality Date  ? ABDOMINAL HYSTERECTOMY    ?  APPENDECTOMY    ? BACK SURGERY    ? BIOPSY  12/25/2016  ? Procedure: BIOPSY;  Surgeon: Rogene Houston, MD;  [facility];  Service: Endoscopy;;  gastric ?  ? BREAST LUMPECTOMY Right 03/20/1999  ? CATARACT EXTRACTION Bilateral 03/19/2014  ? CHOLECYSTECTOMY    ? COLONOSCOPY N/A 12/17/2012  ? Procedure: COLONOSCOPY;  Surgeon: Rogene Houston, MD;  [facility];  Service: Endoscopy;  Laterality: N/A;  215  ? COLONOSCOPY WITH PROPOFOL N/A 07/24/2019  ? Procedure: COLONOSCOPY WITH PROPOFOL;  Surgeon: Rogene Houston, MD;  [facility];  Service: Endoscopy;  Laterality: N/A;  730  ? complete hysterectomy    ? DRUG INDUCED ENDOSCOPY N/A 09/02/2020  ? Procedure: DRUG INDUCED ENDOSCOPY;  Surgeon: Jerrell Belfast, MD;  Location: [facility];  Service: ENT;  Laterality: N/A;  ? ESOPHAGEAL DILATION N/A 12/25/2016  ? Procedure: ESOPHAGEAL DILATION;  Surgeon: Rogene Houston, MD;  [facility];  Service: Endoscopy;  Laterality: N/A;  ? ESOPHAGEAL DILATION N/A 07/24/2019  ? Procedure: ESOPHAGEAL DILATION;  Surgeon: Rogene Houston, MD;  [facility];  Service: Endoscopy;  Laterality: N/A;  ? ESOPHAGOGASTRODUODENOSCOPY N/A 12/25/2016  ? Procedure: ESOPHAGOGASTRODUODENOSCOPY (EGD);  Surgeon: Rogene Houston, MD;  [facility];  Service: Endoscopy;  Laterality: N/A;  730  ? ESOPHAGOGASTRODUODENOSCOPY (EGD) WITH PROPOFOL N/A 07/24/2019  ? Procedure: ESOPHAGOGASTRODUODENOSCOPY (EGD) WITH PROPOFOL;  Surgeon: Rogene Houston, MD;  [facility];  Service: Endoscopy;  Laterality: N/A;  ? EXTRACORPOREAL SHOCK WAVE LITHOTRIPSY Left 02/21/2021  ? Procedure: EXTRACORPOREAL SHOCK WAVE LITHOTRIPSY (ESWL);  Surgeon: Primus Bravo., MD;  [facility];  Service: Urology;  Laterality: Left;  ? Heel tumor removed    ? IMPLANTATION OF HYPOGLOSSAL NERVE STIMULATOR Right 11/25/2020  ? Procedure: IMPLANTATION OF HYPOGLOSSAL NERVE STIMULATOR;   Surgeon: Jerrell Belfast, MD;  Location: [facility];  Service: ENT;  Laterality: Right;  ? POLYPECTOMY  07/24/2019  ? Procedure: POLYPECTOMY;  Surgeon: Rogene Houston, MD;  [facility];  Service: Endoscopy;;  ? rt elbow surgery    ? THUMB FUSION Bilateral   ? TONSILLECTOMY    ? ? ?Current Outpatient Medications  ?Medication Sig Dispense Refill  ? acetaminophen (TYLENOL) 500 MG tablet Take 1,000 mg by mouth every 6 (six) hours as needed for moderate pain.    ? albuterol (PROVENTIL HFA;VENTOLIN HFA) 108 (90 Base) MCG/ACT inhaler Inhale 2 puffs into the lungs every 6 (six) hours as needed for wheezing or shortness of breath.    ? cephALEXin (KEFLEX) 250 MG capsule Take 1 capsule (250 mg total) by mouth daily. 30 capsule 1  ? Cholecalciferol (VITAMIN D-3) 125 MCG (5000 UT) TABS Take by mouth.    ? Cyanocobalamin (B-12) 2500 MCG TABS Take 2,500 mcg by mouth daily.    ? dicyclomine (BENTYL) 10 MG capsule Take 1 capsule by mouth twice daily 180 capsule 0  ? docusate sodium (COLACE) 100 MG capsule Take  2 capsules (200 mg total) by mouth at bedtime. 10 capsule 0  ? DULoxetine (CYMBALTA) 30 MG capsule Take 30 mg by mouth daily.    ? esomeprazole (NEXIUM) 40 MG capsule Take 1 capsule (40 mg total) by mouth daily before breakfast. 90 capsule 3  ? famotidine (PEPCID) 40 MG tablet TAKE 1 TABLET BY MOUTH AT BEDTIME 90 tablet 0  ? Ferrous Fumarate (HEMOCYTE - 106 MG FE) 324 (106 Fe) MG TABS tablet Take 1 tablet (106 mg of iron total) by mouth daily with breakfast. 90 tablet 1  ? Fluticasone-Salmeterol (ADVAIR) 250-50 MCG/DOSE AEPB Inhale 1 puff into the lungs every 12 (twelve) hours.    ? gabapentin (NEURONTIN) 100 MG capsule Take 100 mg by mouth 3 (three) times daily.    ? levothyroxine (SYNTHROID, LEVOTHROID) 75 MCG tablet Take 37.5 mcg by mouth daily before breakfast.    ? lisinopril (PRINIVIL,ZESTRIL) 10 MG tablet Take 10 mg by mouth in the morning.    ? METAMUCIL FIBER PO Take by mouth. Gummies 1 -3 a day as needed     ? metFORMIN (GLUCOPHAGE-XR) 500 MG 24 hr tablet Take 500 mg by mouth daily with supper.    ? mupirocin ointment (BACTROBAN) 2 % Apply 1 application topically 2 (two) times daily.    ? neomycin-bacitracin-polymyxi

## 2021-07-06 ENCOUNTER — Other Ambulatory Visit (INDEPENDENT_AMBULATORY_CARE_PROVIDER_SITE_OTHER): Payer: Self-pay

## 2021-07-07 ENCOUNTER — Ambulatory Visit (HOSPITAL_COMMUNITY)
Admission: RE | Admit: 2021-07-07 | Discharge: 2021-07-07 | Disposition: A | Discharge status: Home / Self Care | Payer: Medicare Other | Source: Ambulatory Visit | Attending: Gastroenterology | Admitting: Gastroenterology

## 2021-07-07 DIAGNOSIS — R1032 Left lower quadrant pain: Secondary | ICD-10-CM | POA: Diagnosis present

## 2021-07-07 LAB — CBC
HCT: 28.9 % — ABNORMAL LOW (ref 35.0–45.0)
Hemoglobin: 9.6 g/dL — ABNORMAL LOW (ref 11.7–15.5)
MCH: 31.2 pg (ref 27.0–33.0)
MCHC: 33.2 g/dL (ref 32.0–36.0)
MCV: 93.8 fL (ref 80.0–100.0)
MPV: 10.3 fL (ref 7.5–12.5)
Platelets: 300 10*3/uL (ref 140–400)
RBC: 3.08 10*6/uL — ABNORMAL LOW (ref 3.80–5.10)
RDW: 13.4 % (ref 11.0–15.0)
WBC: 6.1 10*3/uL (ref 3.8–10.8)

## 2021-07-07 LAB — IRON,TIBC AND FERRITIN PANEL
%SAT: 21 % (calc) (ref 16–45)
Ferritin: 19 ng/mL (ref 16–288)
Iron: 77 ug/dL (ref 45–160)
TIBC: 364 mcg/dL (calc) (ref 250–450)

## 2021-07-07 LAB — POCT I-STAT CREATININE: Creatinine, Ser: 1.2 mg/dL — ABNORMAL HIGH (ref 0.44–1.00)

## 2021-07-07 MED ORDER — IOHEXOL 9 MG/ML PO SOLN
ORAL | Status: AC
  Filled 2021-07-07: qty 1000

## 2021-07-07 MED ORDER — IOHEXOL 300 MG/ML  SOLN
75.0000 mL | Freq: Once | INTRAMUSCULAR | Status: AC | PRN
  Administered 2021-07-07: 75 mL via INTRAVENOUS

## 2021-07-12 ENCOUNTER — Ambulatory Visit (HOSPITAL_COMMUNITY)
Admission: RE | Admit: 2021-07-12 | Discharge: 2021-07-12 | Disposition: A | Discharge status: Home / Self Care | Payer: Medicare Other | Source: Ambulatory Visit | Attending: Internal Medicine | Admitting: Internal Medicine

## 2021-07-12 DIAGNOSIS — R1319 Other dysphagia: Secondary | ICD-10-CM | POA: Diagnosis present

## 2021-07-14 ENCOUNTER — Ambulatory Visit: Payer: Medicare Other | Admitting: Family Medicine

## 2021-07-17 ENCOUNTER — Other Ambulatory Visit (INDEPENDENT_AMBULATORY_CARE_PROVIDER_SITE_OTHER): Payer: Self-pay

## 2021-07-17 DIAGNOSIS — R1319 Other dysphagia: Secondary | ICD-10-CM

## 2021-07-18 ENCOUNTER — Encounter (INDEPENDENT_AMBULATORY_CARE_PROVIDER_SITE_OTHER): Payer: Self-pay

## 2021-07-28 ENCOUNTER — Encounter (HOSPITAL_COMMUNITY)
Admission: RE | Admit: 2021-07-28 | Discharge: 2021-07-28 | Disposition: A | Discharge status: Home / Self Care | Payer: Medicare Other | Source: Ambulatory Visit | Attending: Internal Medicine | Admitting: Internal Medicine

## 2021-07-31 ENCOUNTER — Ambulatory Visit: Payer: Medicare Other | Admitting: Family Medicine

## 2021-08-02 ENCOUNTER — Ambulatory Visit (HOSPITAL_COMMUNITY): Payer: Medicare Other | Admitting: Anesthesiology

## 2021-08-02 ENCOUNTER — Encounter (HOSPITAL_COMMUNITY): Payer: Self-pay | Admitting: Internal Medicine

## 2021-08-02 ENCOUNTER — Encounter (HOSPITAL_COMMUNITY)
Admission: RE | Disposition: A | Discharge status: Home / Self Care | Payer: Self-pay | Source: Home / Self Care | Attending: Internal Medicine

## 2021-08-02 ENCOUNTER — Ambulatory Visit (HOSPITAL_BASED_OUTPATIENT_CLINIC_OR_DEPARTMENT_OTHER): Payer: Medicare Other | Admitting: Anesthesiology

## 2021-08-02 ENCOUNTER — Ambulatory Visit (HOSPITAL_COMMUNITY)
Admission: RE | Admit: 2021-08-02 | Discharge: 2021-08-02 | Disposition: A | Discharge status: Home / Self Care | Payer: Medicare Other | Attending: Internal Medicine | Admitting: Internal Medicine

## 2021-08-02 DIAGNOSIS — K3189 Other diseases of stomach and duodenum: Secondary | ICD-10-CM

## 2021-08-02 DIAGNOSIS — Z7189 Other specified counseling: Secondary | ICD-10-CM

## 2021-08-02 DIAGNOSIS — M1812 Unilateral primary osteoarthritis of first carpometacarpal joint, left hand: Secondary | ICD-10-CM

## 2021-08-02 DIAGNOSIS — R634 Abnormal weight loss: Secondary | ICD-10-CM

## 2021-08-02 DIAGNOSIS — R4182 Altered mental status, unspecified: Secondary | ICD-10-CM

## 2021-08-02 DIAGNOSIS — Q394 Esophageal web: Secondary | ICD-10-CM

## 2021-08-02 DIAGNOSIS — E119 Type 2 diabetes mellitus without complications: Secondary | ICD-10-CM | POA: Insufficient documentation

## 2021-08-02 DIAGNOSIS — D175 Benign lipomatous neoplasm of intra-abdominal organs: Secondary | ICD-10-CM | POA: Diagnosis not present

## 2021-08-02 DIAGNOSIS — K317 Polyp of stomach and duodenum: Secondary | ICD-10-CM

## 2021-08-02 DIAGNOSIS — M1990 Unspecified osteoarthritis, unspecified site: Secondary | ICD-10-CM | POA: Insufficient documentation

## 2021-08-02 DIAGNOSIS — M797 Fibromyalgia: Secondary | ICD-10-CM | POA: Insufficient documentation

## 2021-08-02 DIAGNOSIS — Z9889 Other specified postprocedural states: Secondary | ICD-10-CM

## 2021-08-02 DIAGNOSIS — R9431 Abnormal electrocardiogram [ECG] [EKG]: Secondary | ICD-10-CM

## 2021-08-02 DIAGNOSIS — I10 Essential (primary) hypertension: Secondary | ICD-10-CM | POA: Diagnosis not present

## 2021-08-02 DIAGNOSIS — K1120 Sialoadenitis, unspecified: Secondary | ICD-10-CM

## 2021-08-02 DIAGNOSIS — N183 Chronic kidney disease, stage 3 unspecified: Secondary | ICD-10-CM

## 2021-08-02 DIAGNOSIS — E872 Acidosis, unspecified: Secondary | ICD-10-CM

## 2021-08-02 DIAGNOSIS — D638 Anemia in other chronic diseases classified elsewhere: Secondary | ICD-10-CM

## 2021-08-02 DIAGNOSIS — G8929 Other chronic pain: Secondary | ICD-10-CM

## 2021-08-02 DIAGNOSIS — E039 Hypothyroidism, unspecified: Secondary | ICD-10-CM | POA: Insufficient documentation

## 2021-08-02 DIAGNOSIS — Z9189 Other specified personal risk factors, not elsewhere classified: Secondary | ICD-10-CM

## 2021-08-02 DIAGNOSIS — R1319 Other dysphagia: Secondary | ICD-10-CM

## 2021-08-02 DIAGNOSIS — D72829 Elevated white blood cell count, unspecified: Secondary | ICD-10-CM

## 2021-08-02 DIAGNOSIS — R197 Diarrhea, unspecified: Secondary | ICD-10-CM

## 2021-08-02 DIAGNOSIS — R1032 Left lower quadrant pain: Secondary | ICD-10-CM

## 2021-08-02 DIAGNOSIS — N3941 Urge incontinence: Secondary | ICD-10-CM

## 2021-08-02 DIAGNOSIS — G250 Essential tremor: Secondary | ICD-10-CM

## 2021-08-02 DIAGNOSIS — L03211 Cellulitis of face: Secondary | ICD-10-CM

## 2021-08-02 DIAGNOSIS — R1314 Dysphagia, pharyngoesophageal phase: Secondary | ICD-10-CM | POA: Diagnosis present

## 2021-08-02 DIAGNOSIS — K5900 Constipation, unspecified: Secondary | ICD-10-CM

## 2021-08-02 DIAGNOSIS — D649 Anemia, unspecified: Secondary | ICD-10-CM | POA: Insufficient documentation

## 2021-08-02 DIAGNOSIS — E782 Mixed hyperlipidemia: Secondary | ICD-10-CM

## 2021-08-02 DIAGNOSIS — G4730 Sleep apnea, unspecified: Secondary | ICD-10-CM | POA: Diagnosis not present

## 2021-08-02 DIAGNOSIS — J449 Chronic obstructive pulmonary disease, unspecified: Secondary | ICD-10-CM | POA: Insufficient documentation

## 2021-08-02 DIAGNOSIS — M4316 Spondylolisthesis, lumbar region: Secondary | ICD-10-CM

## 2021-08-02 DIAGNOSIS — F1120 Opioid dependence, uncomplicated: Secondary | ICD-10-CM | POA: Diagnosis not present

## 2021-08-02 DIAGNOSIS — R000 Tachycardia, unspecified: Secondary | ICD-10-CM

## 2021-08-02 DIAGNOSIS — F32A Depression, unspecified: Secondary | ICD-10-CM | POA: Diagnosis not present

## 2021-08-02 DIAGNOSIS — K449 Diaphragmatic hernia without obstruction or gangrene: Secondary | ICD-10-CM

## 2021-08-02 DIAGNOSIS — E7800 Pure hypercholesterolemia, unspecified: Secondary | ICD-10-CM

## 2021-08-02 DIAGNOSIS — D75839 Thrombocytosis, unspecified: Secondary | ICD-10-CM

## 2021-08-02 DIAGNOSIS — K2289 Other specified disease of esophagus: Secondary | ICD-10-CM | POA: Diagnosis not present

## 2021-08-02 DIAGNOSIS — K219 Gastro-esophageal reflux disease without esophagitis: Secondary | ICD-10-CM | POA: Diagnosis not present

## 2021-08-02 DIAGNOSIS — Z87440 Personal history of urinary (tract) infections: Secondary | ICD-10-CM

## 2021-08-02 DIAGNOSIS — R55 Syncope and collapse: Secondary | ICD-10-CM

## 2021-08-02 DIAGNOSIS — J181 Lobar pneumonia, unspecified organism: Secondary | ICD-10-CM

## 2021-08-02 DIAGNOSIS — E860 Dehydration: Secondary | ICD-10-CM

## 2021-08-02 DIAGNOSIS — R110 Nausea: Secondary | ICD-10-CM | POA: Diagnosis not present

## 2021-08-02 DIAGNOSIS — J309 Allergic rhinitis, unspecified: Secondary | ICD-10-CM

## 2021-08-02 DIAGNOSIS — E1165 Type 2 diabetes mellitus with hyperglycemia: Secondary | ICD-10-CM

## 2021-08-02 DIAGNOSIS — R6520 Severe sepsis without septic shock: Secondary | ICD-10-CM

## 2021-08-02 DIAGNOSIS — J13 Pneumonia due to Streptococcus pneumoniae: Secondary | ICD-10-CM

## 2021-08-02 DIAGNOSIS — R933 Abnormal findings on diagnostic imaging of other parts of digestive tract: Secondary | ICD-10-CM

## 2021-08-02 DIAGNOSIS — Z800 Family history of malignant neoplasm of digestive organs: Secondary | ICD-10-CM

## 2021-08-02 DIAGNOSIS — Z7984 Long term (current) use of oral hypoglycemic drugs: Secondary | ICD-10-CM | POA: Insufficient documentation

## 2021-08-02 DIAGNOSIS — G4733 Obstructive sleep apnea (adult) (pediatric): Secondary | ICD-10-CM

## 2021-08-02 DIAGNOSIS — N200 Calculus of kidney: Secondary | ICD-10-CM

## 2021-08-02 DIAGNOSIS — Z79899 Other long term (current) drug therapy: Secondary | ICD-10-CM | POA: Diagnosis not present

## 2021-08-02 DIAGNOSIS — R002 Palpitations: Secondary | ICD-10-CM

## 2021-08-02 DIAGNOSIS — D509 Iron deficiency anemia, unspecified: Secondary | ICD-10-CM

## 2021-08-02 DIAGNOSIS — D62 Acute posthemorrhagic anemia: Secondary | ICD-10-CM

## 2021-08-02 DIAGNOSIS — K589 Irritable bowel syndrome without diarrhea: Secondary | ICD-10-CM

## 2021-08-02 DIAGNOSIS — R0602 Shortness of breath: Secondary | ICD-10-CM

## 2021-08-02 DIAGNOSIS — R0683 Snoring: Secondary | ICD-10-CM

## 2021-08-02 HISTORY — PX: ESOPHAGEAL DILATION: SHX303

## 2021-08-02 HISTORY — PX: ESOPHAGOGASTRODUODENOSCOPY (EGD) WITH PROPOFOL: SHX5813

## 2021-08-02 LAB — GLUCOSE, CAPILLARY: Glucose-Capillary: 86 mg/dL (ref 70–99)

## 2021-08-02 SURGERY — ESOPHAGOGASTRODUODENOSCOPY (EGD) WITH PROPOFOL
Anesthesia: General

## 2021-08-02 MED ORDER — PROPOFOL 10 MG/ML IV BOLUS
INTRAVENOUS | Status: DC | PRN
Start: 2021-08-02 — End: 2021-08-02
  Administered 2021-08-02 (×3): 50 mg via INTRAVENOUS

## 2021-08-02 MED ORDER — LACTATED RINGERS IV SOLN
INTRAVENOUS | Status: DC
  Administered 2021-08-02: 1000 mL via INTRAVENOUS

## 2021-08-02 MED ORDER — LIDOCAINE HCL 1 % IJ SOLN
INTRAMUSCULAR | Status: DC | PRN
  Administered 2021-08-02: 50 mg via INTRADERMAL

## 2021-08-02 NOTE — Op Note (Signed)
[facility] ?Patient Name: Heather Espinoza ?Procedure Date: 08/02/2021 1:04 PM ?MRN: [id number] ?Date of Birth: 04-Sep-1945 ?Attending MD: Hildred Laser , MD ?CSN: [id number] ?Age: 76 ?Admit Type: Outpatient ?Procedure:                Upper GI endoscopy ?Indications:              Esophageal dysphagia, Abnormal cine-esophagram ?Providers:                Hildred Laser, MD, Caprice Kluver, Raphael Gibney,  ?                          Technician, Janeece Riggers, RN ?Referring MD:             London Pepper, MD ?Medicines:                Propofol per Anesthesia ?Complications:            No immediate complications. ?Estimated Blood Loss:     Estimated blood loss was minimal. ?Procedure:                Pre-Anesthesia Assessment: ?                          - Prior to the procedure, a History and Physical  ?                          was performed, and patient medications and  ?                          allergies were reviewed. The patient's tolerance of  ?                          previous anesthesia was also reviewed. The risks  ?                          and benefits of the procedure and the sedation  ?                          options and risks were discussed with the patient.  ?                          All questions were answered, and informed consent  ?                          was obtained. Prior Anticoagulants: The patient has  ?                          taken no previous anticoagulant or antiplatelet  ?                          agents except for aspirin. ASA Grade Assessment:  ?                          III - A patient with severe systemic disease. After  ?  reviewing the risks and benefits, the patient was  ?                          deemed in satisfactory condition to undergo the  ?                          procedure. ?                          After obtaining informed consent, the endoscope was  ?                          passed under direct vision. Throughout the  ?                           procedure, the patient's blood pressure, pulse, and  ?                          oxygen saturations were monitored continuously. The  ?                          GIF-H190 ([id number]) scope was introduced through the  ?                          mouth, and advanced to the second part of duodenum.  ?                          The upper GI endoscopy was accomplished without  ?                          difficulty. The patient tolerated the procedure  ?                          well. ?Scope In: 1:43:46 PM ?Scope Out: 1:51:00 PM ?Total Procedure Duration: 0 hours 7 minutes 14 seconds  ?Findings: ?     The hypopharynx was normal. ?     A subtle web was found in the proximal esophagus. The dilation site was  ?     examined following endoscope reinsertion and showed mild mucosal  ?     disruption, mild improvement in luminal narrowing and no perforation. ?     The Z-line was irregular and was found 34 cm from the incisors. ?     A 2 cm hiatal hernia was present. ?     Multiple 3 to 6 mm pedunculated and sessile polyps with no stigmata of  ?     recent bleeding were found in the gastric fundus and in the gastric body. ?     Erythematous mucosa was found in the prepyloric region of the stomach. ?     There was a small lipoma in the duodenal bulb. ?     The second portion of the duodenum was normal. ?Impression:               - Normal hypopharynx. ?                          - Web in  the proximal esophagus. ?                          - Z-line irregular, 34 cm from the incisors. ?                          - 2 cm hiatal hernia. ?                          - Multiple gastric polyps. ?                          - Erythematous mucosa in the prepyloric region of  ?                          the stomach. ?                          - Duodenal lipoma. ?                          - Normal second portion of the duodenum. ?                          - No specimens collected. ?Moderate Sedation: ?     Per Anesthesia Care ?Recommendation:           -  Patient has a contact number available for  ?                          emergencies. The signs and symptoms of potential  ?                          delayed complications were discussed with the  ?                          patient. Return to normal activities tomorrow.  ?                          Written discharge instructions were provided to the  ?                          patient. ?                          - Resume previous diet today. ?                          - Continue present medications. ?                          - No aspirin, ibuprofen, naproxen, or other  ?                          non-steroidal anti-inflammatory drugs for 3 days. ?                          - Telephone  GI clinic in 1 week. ?Procedure Code(s):        --- Professional --- ?                          [phone], Esophagogastroduodenoscopy, flexible,  ?                          transoral; diagnostic, including collection of  ?                          specimen(s) by brushing or washing, when performed  ?                          (separate procedure) ?Diagnosis Code(s):        --- Professional --- ?                          Q39.4, Esophageal web ?                          K22.8, Other specified diseases of esophagus ?                          K44.9, Diaphragmatic hernia without obstruction or  ?                          gangrene ?                          K31.7, Polyp of stomach and duodenum ?                          K31.89, Other diseases of stomach and duodenum ?                          D17.5, Benign lipomatous neoplasm of  ?                          intra-abdominal organs ?                          R13.14, Dysphagia, pharyngoesophageal phase ?                          R93.3, Abnormal findings on diagnostic imaging of  ?                          other parts of digestive tract ?CPT copyright 2019 [employer]. All rights reserved. ?The codes documented in this report are preliminary and upon coder review may  ?be revised to meet  current compliance requirements. ?Hildred Laser, MD ?Hildred Laser, MD ?08/02/2021 2:02:06 PM ?This report has been signed electronically. ?Number of Addenda: 0 ?

## 2021-08-02 NOTE — Transfer of Care (Signed)
Immediate Anesthesia Transfer of Care Note ? ?Patient: Heather Espinoza ? ?Procedure(s) Performed: ESOPHAGOGASTRODUODENOSCOPY (EGD) WITH PROPOFOL ?ESOPHAGEAL DILATION ? ?Patient Location: Short Stay ? ?Anesthesia Type:General ? ?Level of Consciousness: awake ? ?Airway & Oxygen Therapy: Patient Spontanous Breathing ? ?Post-op Assessment: Report given to RN ? ?Post vital signs: Reviewed and stable ? ?Last Vitals:  ?Vitals Value Taken Time  ?BP 118/40 08/02/21 1355  ?Temp 36.7 ?C 08/02/21 1355  ?Pulse 75 08/02/21 1355  ?Resp 16 08/02/21 1355  ?SpO2 97 % 08/02/21 1355  ? ? ?Last Pain:  ?Vitals:  ? 08/02/21 1355  ?TempSrc: Oral  ?PainSc: 0-No pain  ?   ? ?Patients Stated Pain Goal: 7 (08/02/21 1053) ? ?Complications: No notable events documented. ?

## 2021-08-02 NOTE — Discharge Instructions (Addendum)
No aspirin or NSAIDs for 72 hours. ?Resume other medications and diet as before. ?No driving for 24 hours. ?Please call office with progress report next week ?

## 2021-08-02 NOTE — Anesthesia Postprocedure Evaluation (Signed)
Anesthesia Post Note ? ?Patient: ELOYCE BULTMAN ? ?Procedure(s) Performed: ESOPHAGOGASTRODUODENOSCOPY (EGD) WITH PROPOFOL ?ESOPHAGEAL DILATION ? ?Patient location during evaluation: Short Stay ?Anesthesia Type: General ?Level of consciousness: awake and alert ?Pain management: pain level controlled ?Vital Signs Assessment: post-procedure vital signs reviewed and stable ?Respiratory status: spontaneous breathing ?Cardiovascular status: blood pressure returned to baseline and stable ?Postop Assessment: no apparent nausea or vomiting ?Anesthetic complications: no ? ? ?No notable events documented. ? ? ?Last Vitals:  ?Vitals:  ? 08/02/21 1053 08/02/21 1355  ?BP: (!) 125/55 (!) 118/40  ?Pulse:  75  ?Resp: 14 16  ?Temp: 36.6 ?C 36.7 ?C  ?SpO2: 98% 97%  ?  ?Last Pain:  ?Vitals:  ? 08/02/21 1355  ?TempSrc: Oral  ?PainSc: 0-No pain  ? ? ?  ?  ?  ?  ?  ?  ? ?Breiona Couvillon ? ? ? ? ?

## 2021-08-02 NOTE — Anesthesia Preprocedure Evaluation (Signed)
Anesthesia Evaluation  ?Patient identified by MRN, date of birth, ID band ?Patient awake ? ? ? ?Reviewed: ?Allergy & Precautions, NPO status , Patient's Chart, lab work & pertinent test results ? ?Airway ?Mallampati: II ? ?TM Distance: >3 FB ?Neck ROM: Full ? ? ? Dental ? ?(+) Chipped, Missing, Dental Advisory Given ?  ?Pulmonary ?shortness of breath and with exertion, sleep apnea , pneumonia, COPD,  ?  ?Pulmonary exam normal ?breath sounds clear to auscultation ? ? ? ? ? ? Cardiovascular ?Exercise Tolerance: Good ?hypertension, Pt. on medications ?+ DOE  ?Normal cardiovascular exam ?Rhythm:Regular Rate:Normal ? ? ?  ?Neuro/Psych ?PSYCHIATRIC DISORDERS Depression  Neuromuscular disease   ? GI/Hepatic ?Neg liver ROS, GERD  Medicated and Controlled,  ?Endo/Other  ?diabetes, Well Controlled, Type 2, Oral Hypoglycemic AgentsHypothyroidism  ? Renal/GU ?Renal disease  ?negative genitourinary ?  ?Musculoskeletal ? ?(+) Arthritis , Osteoarthritis,  Fibromyalgia -, narcotic dependent ? Abdominal ?  ?Peds ?negative pediatric ROS ?(+)  Hematology ? ?(+) Blood dyscrasia, anemia ,   ?Anesthesia Other Findings ? ? Reproductive/Obstetrics ?negative OB ROS ? ?  ? ? ? ? ? ? ? ? ? ? ? ? ? ?  ?  ? ? ? ? ? ? ? ?Anesthesia Physical ?Anesthesia Plan ? ?ASA: 3 ? ?Anesthesia Plan: General  ? ?Post-op Pain Management: Minimal or no pain anticipated  ? ?Induction: Intravenous ? ?PONV Risk Score and Plan: Propofol infusion ? ?Airway Management Planned: Nasal Cannula and Natural Airway ? ?Additional Equipment:  ? ?Intra-op Plan:  ? ?Post-operative Plan:  ? ?Informed Consent: I have reviewed the patients History and Physical, chart, labs and discussed the procedure including the risks, benefits and alternatives for the proposed anesthesia with the patient or authorized representative who has indicated his/her understanding and acceptance.  ? ? ? ?Dental advisory given ? ?Plan Discussed with: CRNA and  Surgeon ? ?Anesthesia Plan Comments:   ? ? ? ? ? ? ?Anesthesia Quick Evaluation ? ?

## 2021-08-02 NOTE — H&P (Signed)
Heather Espinoza is an 76 y.o. female.   ?Chief Complaint: Patient is here for esophagogastroduodenoscopy with esophageal dilation ?HPI: Patient is 76 year old Caucasian female with history of chronic GERD as well as esophageal web who presents for dysphagia.  She has responded to esophageal dilation in the past.  She underwent barium pill study recently which revealed esophageal web obstructing the passage of barium pill.  It finally went down.  She continues to experience dysphagia and points to suprasternal area as site of bolus obstruction.  She said food bolus eventually passes down.  She has not had any episode of regurgitation.  She has intermittent nausea usually at night.  She denies epigastric pain melena.  She states she has gained 5 pounds since her last office visit. ?Aspirin dose was 5 days ago. ? ?Past Medical History:  ?Diagnosis Date  ? Anemia   ? Arthritis   ? COPD (chronic obstructive pulmonary disease) ([place])   ? COVID-19 11/11/2020  ? Diabetes ([place])   ? Diverticulitis   ? Diverticulosis   ? Dyspnea   ? Fibromyalgia   ? High cholesterol   ? History of kidney stones   ? Hypertension   ? Hypothyroid   ? IBS (irritable bowel syndrome)   ? Osteopenia   ? Pneumonia   ? Restless leg   ? Sleep apnea   ? Doesn't use CPAP.    ? Tachycardia   ? per pt/fim  ? Tremor, essential 01/07/2018  ?  GERD ? ?Past Surgical History:  ?Procedure Laterality Date  ? ABDOMINAL HYSTERECTOMY    ? APPENDECTOMY    ? BACK SURGERY    ? BIOPSY  12/25/2016  ? Procedure: BIOPSY;  Surgeon: Rogene Houston, MD;  [facility];  Service: Endoscopy;;  gastric ?  ? BREAST LUMPECTOMY Right 03/20/1999  ? CATARACT EXTRACTION Bilateral 03/19/2014  ? CHOLECYSTECTOMY    ? COLONOSCOPY N/A 12/17/2012  ? Procedure: COLONOSCOPY;  Surgeon: Rogene Houston, MD;  [facility];  Service: Endoscopy;  Laterality: N/A;  215  ? COLONOSCOPY WITH PROPOFOL N/A 07/24/2019  ? Procedure: COLONOSCOPY WITH PROPOFOL;  Surgeon: Rogene Houston, MD;  [facility];  Service: Endoscopy;  Laterality: N/A;  730  ? complete hysterectomy    ? DRUG INDUCED ENDOSCOPY N/A 09/02/2020  ? Procedure: DRUG INDUCED ENDOSCOPY;  Surgeon: Jerrell Belfast, MD;  Location: [place];  Service: ENT;  Laterality: N/A;  ? ESOPHAGEAL DILATION N/A 12/25/2016  ? Procedure: ESOPHAGEAL DILATION;  Surgeon: Rogene Houston, MD;  [facility];  Service: Endoscopy;  Laterality: N/A;  ? ESOPHAGEAL DILATION N/A 07/24/2019  ? Procedure: ESOPHAGEAL DILATION;  Surgeon: Rogene Houston, MD;  [facility];  Service: Endoscopy;  Laterality: N/A;  ? ESOPHAGOGASTRODUODENOSCOPY N/A 12/25/2016  ? Procedure: ESOPHAGOGASTRODUODENOSCOPY (EGD);  Surgeon: Rogene Houston, MD;  [facility];  Service: Endoscopy;  Laterality: N/A;  730  ? ESOPHAGOGASTRODUODENOSCOPY (EGD) WITH PROPOFOL N/A 07/24/2019  ? Procedure: ESOPHAGOGASTRODUODENOSCOPY (EGD) WITH PROPOFOL;  Surgeon: Rogene Houston, MD;  [facility];  Service: Endoscopy;  Laterality: N/A;  ? EXTRACORPOREAL SHOCK WAVE LITHOTRIPSY Left 02/21/2021  ? Procedure: EXTRACORPOREAL SHOCK WAVE LITHOTRIPSY (ESWL);  Surgeon: Primus Bravo., MD;  [facility];  Service: Urology;  Laterality: Left;  ? Heel tumor removed    ? IMPLANTATION OF HYPOGLOSSAL NERVE STIMULATOR Right 11/25/2020  ? Procedure: IMPLANTATION OF HYPOGLOSSAL NERVE STIMULATOR;  Surgeon: Jerrell Belfast, MD;  Location: [facility];  Service: ENT;  Laterality: Right;  ? POLYPECTOMY  07/24/2019  ? Procedure: POLYPECTOMY;  Surgeon: Rogene Houston, MD;  [facility];  Service: Endoscopy;;  ? rt elbow surgery    ? THUMB FUSION Bilateral   ? TONSILLECTOMY    ? ? ?Family History  ?Problem Relation Age of Onset  ? Uterine cancer Mother   ? Parkinson's disease Father   ? Stroke Father   ? Colon cancer Brother   ? Lung cancer Brother   ? COPD Sister   ? Diabetes Grandchild   ? Asthma Son   ? ?Social History:   reports that she has never smoked. She has never been exposed to tobacco smoke. She has never used smokeless tobacco. She reports that she does not drink alcohol and does not use drugs. ? ?Allergies:  ?Allergies  ?Allergen Reactions  ? Flagyl [Metronidazole] Shortness Of Breath and Swelling  ? Lyrica [Pregabalin] Shortness Of Breath  ? Adhesive [Tape] Other (See Comments)  ?  Takes off patient's skin  ? Aspirin Other (See Comments)  ?  Stomach bleed  ? Latex Itching and Rash  ? Phenergan [Promethazine Hcl] Other (See Comments)  ?  Caused grogginess, altered mental status  ? Prednisone Anxiety  ? Sulfa Antibiotics Nausea Only  ? Tetanus Toxoids Swelling  ?  Arm Area  ? Wound Dressing Adhesive Rash  ? ? ?Medications Prior to Admission  ?Medication Sig Dispense Refill  ? acetaminophen (TYLENOL) 500 MG tablet Take 1,000 mg by mouth every 6 (six) hours as needed for moderate pain.    ? albuterol (PROVENTIL HFA;VENTOLIN HFA) 108 (90 Base) MCG/ACT inhaler Inhale 2 puffs into the lungs every 6 (six) hours as needed for wheezing or shortness of breath.    ? Biotin w/ Vitamins C & E (HAIR SKIN & NAILS GUMMIES PO) Take 2 tablets by mouth daily.    ? cephALEXin (KEFLEX) 250 MG capsule Take 1 capsule (250 mg total) by mouth daily. 30 capsule 1  ? Cholecalciferol (VITAMIN D-3) 25 MCG (1000 UT) CAPS Take 1,000 Units by mouth in the morning.    ? Cyanocobalamin (B-12) 1000 MCG CAPS Take 1,000 mcg by mouth in the morning.    ? dicyclomine (BENTYL) 10 MG capsule Take 1 capsule (10 mg total) by mouth 3 (three) times daily before meals. 180 capsule 1  ? docusate sodium (COLACE) 100 MG capsule Take 2 capsules (200 mg total) by mouth at bedtime. (Patient taking differently: Take 200 mg by mouth at bedtime as needed (constipation.).) 10 capsule 0  ? DULoxetine (CYMBALTA) 60 MG capsule Take 60 mg by mouth in the morning.    ? esomeprazole (NEXIUM) 40 MG capsule Take 1 capsule (40 mg total) by mouth daily before breakfast. 90 capsule 3  ?  famotidine (PEPCID) 40 MG tablet Take 1 tablet (40 mg total) by mouth at bedtime. 90 tablet 3  ? Ferrous Fumarate (HEMOCYTE - 106 MG FE) 324 (106 Fe) MG TABS tablet Take 1 tablet (106 mg of iron total) by mouth daily with breakfast. 90 tablet 1  ? ferrous gluconate (FERGON) 240 (27 FE) MG tablet Take 240 mg by mouth every evening.    ? Fluticasone-Salmeterol (ADVAIR) 250-50 MCG/DOSE AEPB Inhale 1 puff into the lungs every 12 (twelve) hours.    ? gabapentin (NEURONTIN) 100 MG capsule Take 100 mg by mouth 3 (three) times daily.    ? levothyroxine (SYNTHROID, LEVOTHROID) 75 MCG tablet Take 37.5 mcg by  mouth daily before breakfast.    ? lisinopril (PRINIVIL,ZESTRIL) 10 MG tablet Take 10 mg by mouth in the morning.    ? METAMUCIL FIBER PO Take 1-3 each by mouth daily. Gummies    ? metFORMIN (GLUCOPHAGE-XR) 500 MG 24 hr tablet Take 500 mg by mouth daily before supper.    ? mupirocin ointment (BACTROBAN) 2 % Apply 1 application. topically 2 (two) times daily as needed (skin breakdown).    ? neomycin-bacitracin-polymyxin (NEOSPORIN) ointment Apply 1 application topically as needed for wound care.    ? ondansetron (ZOFRAN) 4 MG tablet TAKE 1 TABLET BY MOUTH TWICE DAILY AS NEEDED FOR NAUSEA OR VOMITING 30 tablet 1  ? simvastatin (ZOCOR) 20 MG tablet Take 20 mg by mouth every evening.    ? traMADol (ULTRAM) 50 MG tablet Take 50-100 mg by mouth every 6 (six) hours as needed for pain.    ? traZODone (DESYREL) 100 MG tablet Take 50-100 mg by mouth at bedtime.    ? trospium (SANCTURA) 20 MG tablet Take 1 tablet (20 mg total) by mouth 2 (two) times daily. 60 tablet 11  ? vitamin E 180 MG (400 UNITS) capsule Take 400 Units by mouth in the morning.    ? ? ?Results for orders placed or performed during the hospital encounter of 08/02/21 (from the past 48 hour(s))  ?Glucose, capillary     Status: None  ? Collection Time: 08/02/21 11:01 AM  ?Result Value Ref Range  ? Glucose-Capillary 86 70 - 99 mg/dL  ?  Comment: Glucose reference  range applies only to samples taken after fasting for at least 8 hours.  ? ?No results found. ? ?Review of Systems ? ?Blood pressure (!) 125/55, temperature 97.8 ?F (36.6 ?C), temperature source Oral, resp. rate

## 2021-08-15 ENCOUNTER — Encounter (HOSPITAL_COMMUNITY): Payer: Self-pay | Admitting: Internal Medicine

## 2021-08-18 ENCOUNTER — Ambulatory Visit (INDEPENDENT_AMBULATORY_CARE_PROVIDER_SITE_OTHER): Payer: Medicare Other | Admitting: Urology

## 2021-08-18 ENCOUNTER — Encounter: Payer: Self-pay | Admitting: Urology

## 2021-08-18 VITALS — BP 116/73 | HR 72 | Ht 61.5 in | Wt 120.0 lb

## 2021-08-18 DIAGNOSIS — Z87440 Personal history of urinary (tract) infections: Secondary | ICD-10-CM | POA: Diagnosis not present

## 2021-08-18 DIAGNOSIS — N3941 Urge incontinence: Secondary | ICD-10-CM

## 2021-08-18 DIAGNOSIS — R8290 Unspecified abnormal findings in urine: Secondary | ICD-10-CM | POA: Diagnosis not present

## 2021-08-18 DIAGNOSIS — N200 Calculus of kidney: Secondary | ICD-10-CM | POA: Diagnosis not present

## 2021-08-18 LAB — MICROSCOPIC EXAMINATION: RBC, Urine: NONE SEEN /hpf (ref 0–2)

## 2021-08-18 LAB — URINALYSIS, ROUTINE W REFLEX MICROSCOPIC
Bilirubin, UA: NEGATIVE
Glucose, UA: NEGATIVE
Nitrite, UA: POSITIVE — AB
RBC, UA: NEGATIVE
Specific Gravity, UA: 1.02 (ref 1.005–1.030)
Urobilinogen, Ur: 0.2 mg/dL (ref 0.2–1.0)
pH, UA: 5.5 (ref 5.0–7.5)

## 2021-08-18 LAB — BLADDER SCAN AMB NON-IMAGING: Scan Result: 0

## 2021-08-18 MED ORDER — CEFDINIR 300 MG PO CAPS
300.0000 mg | ORAL_CAPSULE | Freq: Two times a day (BID) | ORAL | 0 refills | Status: AC
Start: 2021-08-18 — End: 2021-08-25

## 2021-08-18 NOTE — Progress Notes (Signed)
post void residual =0mL 

## 2021-08-18 NOTE — Progress Notes (Signed)
Assessment: 1. Urge incontinence   2. Left nephrolithiasis   3. History of UTI   4. Abnormal urine findings     Plan: Urine culture sent today. Begin cefdinir 300 mg p.o. twice daily x7 days.  Rx sent. Discontinue trospium due to ineffectiveness. Samples of Gemtesa 75 mg provided.  Other drugs are not recommended due to potential QT prolongation including Myrbetriq. Return to office in 6 weeks.  Chief Complaint:  Chief Complaint  Patient presents with   Urinary Incontinence    History of Present Illness:  Heather Espinoza is a 76 y.o. year old female who is seen for further evaluation of a left renal calculus, history of UTI, and urge incontinence.  She was admitted to the hospital in October 2022 for pyelonephritis.  She has a history of recurrent UTIs getting 3-4 UTIs per year.  CT scan obtained during the hospital admission showed a 1 cm left lower pole calculus with mild fullness of the lower pole.  Urine culture grew E. coli.  He was treated with culture appropriate antibiotics.  She was seen by Dr. Alyson Ingles while in the hospital.  Treatment options for the left renal calculus were discussed including ureteroscopy and shockwave lithotripsy. She completed her antibiotics.  She was not having any UTI symptoms at her visit on 01/30/21.  No flank pain.  No dysuria or gross hematuria. Urine culture from 01/30/21 grew >100 K E. Coli.  Treated with Cefdinir x 7 days. KUB from 01/30/21 showed 9 mm calcification in lower left renal shadow.  She underwent left ESL on 02/21/2021. KUB from 04/04/2021 showed the left lower pole calcification decreased in size to approximately 6 mm. She did well following the procedure.  She was not aware of passing any large stone fragments.  She continued on daily cephalexin for UTI prevention. Urine culture from 1/23 showed <10K colonies. She was given a trial of Gemtesa for symptoms of urge incontinence.  Renal ultrasound from 05/18/2021 showed no  evidence of renal mass or hydronephrosis.  At her visit in March 2023, she continued on daily cephalexin.  No recent UTI symptoms.  No flank pain.  No dysuria or gross hematuria.  She reported significant improvement in her urge incontinence with the Gemtesa.  No side effects.  She returns today for follow-up.  She ran out of the daily cephalexin 1-2 days ago.  She is not having any dysuria or gross hematuria.  She is having frequency, urgency, and urge incontinence.  She was unable to afford the [facility] and is currently on trospium.  She does not feel like this medication controls her symptoms.  Portions of the above documentation were copied from a prior visit for review purposes only.   Past Medical History:  Past Medical History:  Diagnosis Date   Anemia    Arthritis    COPD (chronic obstructive pulmonary disease) ([place])    COVID-19 11/11/2020   Diabetes ([place])    Diverticulitis    Diverticulosis    Dyspnea    Fibromyalgia    High cholesterol    History of kidney stones    Hypertension    Hypothyroid    IBS (irritable bowel syndrome)    Osteopenia    Pneumonia    Restless leg    Sleep apnea    Doesn't use CPAP.     Tachycardia    per pt/fim   Tremor, essential 01/07/2018    Past Surgical History:  Past Surgical History:  Procedure Laterality Date  ABDOMINAL HYSTERECTOMY     APPENDECTOMY     BACK SURGERY     BIOPSY  12/25/2016   Procedure: BIOPSY;  Surgeon: Rogene Houston, MD;  [facility];  Service: Endoscopy;;  gastric    BREAST LUMPECTOMY Right 03/20/1999   CATARACT EXTRACTION Bilateral 03/19/2014   CHOLECYSTECTOMY     COLONOSCOPY N/A 12/17/2012   Procedure: COLONOSCOPY;  Surgeon: Rogene Houston, MD;  [facility];  Service: Endoscopy;  Laterality: N/A;  215   COLONOSCOPY WITH PROPOFOL N/A 07/24/2019   Procedure: COLONOSCOPY WITH PROPOFOL;  Surgeon: Rogene Houston, MD;  [facility];  Service: Endoscopy;  Laterality: N/A;   730   complete hysterectomy     DRUG INDUCED ENDOSCOPY N/A 09/02/2020   Procedure: DRUG INDUCED ENDOSCOPY;  Surgeon: Jerrell Belfast, MD;  Location: [place];  Service: ENT;  Laterality: N/A;   ESOPHAGEAL DILATION N/A 12/25/2016   Procedure: ESOPHAGEAL DILATION;  Surgeon: Rogene Houston, MD;  [facility];  Service: Endoscopy;  Laterality: N/A;   ESOPHAGEAL DILATION N/A 07/24/2019   Procedure: ESOPHAGEAL DILATION;  Surgeon: Rogene Houston, MD;  [facility];  Service: Endoscopy;  Laterality: N/A;   ESOPHAGEAL DILATION N/A 08/02/2021   Procedure: ESOPHAGEAL DILATION;  Surgeon: Rogene Houston, MD;  [facility];  Service: Endoscopy;  Laterality: N/A;   ESOPHAGOGASTRODUODENOSCOPY N/A 12/25/2016   Procedure: ESOPHAGOGASTRODUODENOSCOPY (EGD);  Surgeon: Rogene Houston, MD;  [facility];  Service: Endoscopy;  Laterality: N/A;  730   ESOPHAGOGASTRODUODENOSCOPY (EGD) WITH PROPOFOL N/A 07/24/2019   Procedure: ESOPHAGOGASTRODUODENOSCOPY (EGD) WITH PROPOFOL;  Surgeon: Rogene Houston, MD;  [facility];  Service: Endoscopy;  Laterality: N/A;   ESOPHAGOGASTRODUODENOSCOPY (EGD) WITH PROPOFOL N/A 08/02/2021   Procedure: ESOPHAGOGASTRODUODENOSCOPY (EGD) WITH PROPOFOL;  Surgeon: Rogene Houston, MD;  [facility];  Service: Endoscopy;  Laterality: N/A;  1245 ASA 2   EXTRACORPOREAL SHOCK WAVE LITHOTRIPSY Left 02/21/2021   Procedure: EXTRACORPOREAL SHOCK WAVE LITHOTRIPSY (ESWL);  Surgeon: Primus Bravo., MD;  [facility];  Service: Urology;  Laterality: Left;   Heel tumor removed     IMPLANTATION OF HYPOGLOSSAL NERVE STIMULATOR Right 11/25/2020   Procedure: IMPLANTATION OF HYPOGLOSSAL NERVE STIMULATOR;  Surgeon: Jerrell Belfast, MD;  Location: [place];  Service: ENT;  Laterality: Right;   POLYPECTOMY  07/24/2019   Procedure: POLYPECTOMY;  Surgeon: Rogene Houston, MD;  [facility];  Service:  Endoscopy;;   rt elbow surgery     THUMB FUSION Bilateral    TONSILLECTOMY      Allergies:  Allergies  Allergen Reactions   Flagyl [Metronidazole] Shortness Of Breath and Swelling   Lyrica [Pregabalin] Shortness Of Breath   Adhesive [Tape] Other (See Comments)    Takes off patient's skin   Aspirin Other (See Comments)    Stomach bleed   Latex Itching and Rash   Phenergan [Promethazine Hcl] Other (See Comments)    Caused grogginess, altered mental status   Prednisone Anxiety   Sulfa Antibiotics Nausea Only   Tetanus Toxoids Swelling    Arm Area   Wound Dressing Adhesive Rash    Family History:  Family History  Problem Relation Age of Onset   Uterine cancer Mother    Parkinson's disease Father    Stroke Father    Colon cancer Brother    Lung cancer Brother    COPD Sister    Diabetes Grandchild  Asthma Son     Social History:  Social History   Tobacco Use   Smoking status: Never    Passive exposure: Never   Smokeless tobacco: Never  Vaping Use   Vaping Use: Never used  Substance Use Topics   Alcohol use: No    Alcohol/week: 0.0 standard drinks   Drug use: Never    ROS: Constitutional:  Negative for fever, chills, weight loss CV: Negative for chest pain, previous MI, hypertension Respiratory:  Negative for shortness of breath, wheezing, sleep apnea, frequent cough GI:  Negative for nausea, vomiting, bloody stool, GERD  Physical exam: BP 116/73   Pulse 72   Ht 5' 1.5" (1.562 m)   Wt 120 lb (54.4 kg)   BMI 22.31 kg/m  GENERAL APPEARANCE:  Well appearing, well developed, well nourished, NAD HEENT:  Atraumatic, normocephalic, oropharynx clear NECK:  Supple without lymphadenopathy or thyromegaly ABDOMEN:  Soft, non-tender, no masses EXTREMITIES:  Moves all extremities well, without clubbing, cyanosis, or edema NEUROLOGIC:  Alert and oriented x 3, normal gait, CN II-XII grossly intact MENTAL STATUS:  appropriate BACK:  Non-tender to palpation, No  CVAT SKIN:  Warm, dry, and intact   Results: U/A: 11-30 WBCs, many bacteria, nitrite positive  PVR = 0 ml

## 2021-08-21 LAB — URINE CULTURE

## 2021-08-22 ENCOUNTER — Telehealth: Payer: Self-pay

## 2021-08-22 MED ORDER — CEPHALEXIN 500 MG PO CAPS: 500.0000 mg | ORAL_CAPSULE | Freq: Every day | ORAL | 2 refills | Status: DC

## 2021-08-22 NOTE — Telephone Encounter (Signed)
-----   Message from Primus Bravo, MD sent at 08/22/2021 12:44 PM EDT ----- Please notify patient that her urine culture did show evidence of a UTI. Recommend completing Cefdinir x 7 days then resuming daily Cephalexin - new rx sent. Keep f/u for 6 weeks

## 2021-08-22 NOTE — Addendum Note (Signed)
Addended by: Primus Bravo on: 08/22/2021 12:44 PM   Modules accepted: Orders

## 2021-08-22 NOTE — Telephone Encounter (Signed)
Informed patient of her results. Pt voiced understanding.

## 2021-08-29 ENCOUNTER — Ambulatory Visit: Payer: Medicare Other | Admitting: Pulmonary Disease

## 2021-09-14 ENCOUNTER — Other Ambulatory Visit (INDEPENDENT_AMBULATORY_CARE_PROVIDER_SITE_OTHER): Payer: Self-pay | Admitting: Internal Medicine

## 2021-09-27 ENCOUNTER — Ambulatory Visit: Payer: Medicare Other | Admitting: Urology

## 2021-09-27 NOTE — Progress Notes (Deleted)
Assessment: 1. History of UTI   2. Urge incontinence      Plan: Continue daily cephalexin. Samples of Gemtesa 75 mg provided.  Other drugs are not recommended due to potential QT prolongation including Myrbetriq. Return to office in 6 weeks.  Chief Complaint:  No chief complaint on file.   History of Present Illness:  Heather Espinoza is a 76 y.o. year old female who is seen for further evaluation of a left renal calculus, history of UTI, and urge incontinence.  She was admitted to the hospital in October 2022 for pyelonephritis.  She has a history of recurrent UTIs getting 3-4 UTIs per year.  CT scan obtained during the hospital admission showed a 1 cm left lower pole calculus with mild fullness of the lower pole.  Urine culture grew E. coli.  He was treated with culture appropriate antibiotics.  She was seen by Dr. Alyson Ingles while in the hospital.  Treatment options for the left renal calculus were discussed including ureteroscopy and shockwave lithotripsy. She completed her antibiotics.  She was not having any UTI symptoms at her visit on 01/30/21.  No flank pain.  No dysuria or gross hematuria. Urine culture from 01/30/21 grew >100 K E. Coli.  Treated with Cefdinir x 7 days. KUB from 01/30/21 showed 9 mm calcification in lower left renal shadow.  She underwent left ESL on 02/21/2021. KUB from 04/04/2021 showed the left lower pole calcification decreased in size to approximately 6 mm. She did well following the procedure.  She was not aware of passing any large stone fragments.  She continued on daily cephalexin for UTI prevention. Urine culture from 1/23 showed <10K colonies. She was given a trial of Gemtesa for symptoms of urge incontinence.  Renal ultrasound from 05/18/2021 showed no evidence of renal mass or hydronephrosis.  At her visit in March 2023, she continued on daily cephalexin.  No recent UTI symptoms.  No flank pain.  No dysuria or gross hematuria.  She reported  significant improvement in her urge incontinence with the Gemtesa.  No side effects. At her visit in June 2023, she had run out of the daily cephalexin.  She was not having any dysuria or gross hematuria.  She was having frequency, urgency, and urge incontinence.  She was unable to afford the [facility] and was on trospium.  She did not feel like this medication controlled her symptoms. Urine culture grew >100 K E. coli.  She was treated with cefdinir and restarted on daily cephalexin. She was also restarted on Gemtesa 75 mg daily.  Portions of the above documentation were copied from a prior visit for review purposes only.   Past Medical History:  Past Medical History:  Diagnosis Date   Anemia    Arthritis    COPD (chronic obstructive pulmonary disease) ([place])    COVID-19 11/11/2020   Diabetes ([place])    Diverticulitis    Diverticulosis    Dyspnea    Fibromyalgia    High cholesterol    History of kidney stones    Hypertension    Hypothyroid    IBS (irritable bowel syndrome)    Osteopenia    Pneumonia    Restless leg    Sleep apnea    Doesn't use CPAP.     Tachycardia    per pt/fim   Tremor, essential 01/07/2018    Past Surgical History:  Past Surgical History:  Procedure Laterality Date   ABDOMINAL HYSTERECTOMY     APPENDECTOMY  BACK SURGERY     BIOPSY  12/25/2016   Procedure: BIOPSY;  Surgeon: Rogene Houston, MD;  [facility];  Service: Endoscopy;;  gastric    BREAST LUMPECTOMY Right 03/20/1999   CATARACT EXTRACTION Bilateral 03/19/2014   CHOLECYSTECTOMY     COLONOSCOPY N/A 12/17/2012   Procedure: COLONOSCOPY;  Surgeon: Rogene Houston, MD;  [facility];  Service: Endoscopy;  Laterality: N/A;  215   COLONOSCOPY WITH PROPOFOL N/A 07/24/2019   Procedure: COLONOSCOPY WITH PROPOFOL;  Surgeon: Rogene Houston, MD;  [facility];  Service: Endoscopy;  Laterality: N/A;  730   complete hysterectomy     DRUG INDUCED ENDOSCOPY N/A  09/02/2020   Procedure: DRUG INDUCED ENDOSCOPY;  Surgeon: Jerrell Belfast, MD;  Location: [place];  Service: ENT;  Laterality: N/A;   ESOPHAGEAL DILATION N/A 12/25/2016   Procedure: ESOPHAGEAL DILATION;  Surgeon: Rogene Houston, MD;  [facility];  Service: Endoscopy;  Laterality: N/A;   ESOPHAGEAL DILATION N/A 07/24/2019   Procedure: ESOPHAGEAL DILATION;  Surgeon: Rogene Houston, MD;  [facility];  Service: Endoscopy;  Laterality: N/A;   ESOPHAGEAL DILATION N/A 08/02/2021   Procedure: ESOPHAGEAL DILATION;  Surgeon: Rogene Houston, MD;  [facility];  Service: Endoscopy;  Laterality: N/A;   ESOPHAGOGASTRODUODENOSCOPY N/A 12/25/2016   Procedure: ESOPHAGOGASTRODUODENOSCOPY (EGD);  Surgeon: Rogene Houston, MD;  [facility];  Service: Endoscopy;  Laterality: N/A;  730   ESOPHAGOGASTRODUODENOSCOPY (EGD) WITH PROPOFOL N/A 07/24/2019   Procedure: ESOPHAGOGASTRODUODENOSCOPY (EGD) WITH PROPOFOL;  Surgeon: Rogene Houston, MD;  [facility];  Service: Endoscopy;  Laterality: N/A;   ESOPHAGOGASTRODUODENOSCOPY (EGD) WITH PROPOFOL N/A 08/02/2021   Procedure: ESOPHAGOGASTRODUODENOSCOPY (EGD) WITH PROPOFOL;  Surgeon: Rogene Houston, MD;  [facility];  Service: Endoscopy;  Laterality: N/A;  1245 ASA 2   EXTRACORPOREAL SHOCK WAVE LITHOTRIPSY Left 02/21/2021   Procedure: EXTRACORPOREAL SHOCK WAVE LITHOTRIPSY (ESWL);  Surgeon: Primus Bravo., MD;  [facility];  Service: Urology;  Laterality: Left;   Heel tumor removed     IMPLANTATION OF HYPOGLOSSAL NERVE STIMULATOR Right 11/25/2020   Procedure: IMPLANTATION OF HYPOGLOSSAL NERVE STIMULATOR;  Surgeon: Jerrell Belfast, MD;  Location: [place];  Service: ENT;  Laterality: Right;   POLYPECTOMY  07/24/2019   Procedure: POLYPECTOMY;  Surgeon: Rogene Houston, MD;  [facility];  Service: Endoscopy;;   rt elbow surgery     THUMB FUSION Bilateral     TONSILLECTOMY      Allergies:  Allergies  Allergen Reactions   Flagyl [Metronidazole] Shortness Of Breath and Swelling   Lyrica [Pregabalin] Shortness Of Breath   Adhesive [Tape] Other (See Comments)    Takes off patient's skin   Aspirin Other (See Comments)    Stomach bleed   Latex Itching and Rash   Phenergan [Promethazine Hcl] Other (See Comments)    Caused grogginess, altered mental status   Prednisone Anxiety   Sulfa Antibiotics Nausea Only   Tetanus Toxoids Swelling    Arm Area   Wound Dressing Adhesive Rash    Family History:  Family History  Problem Relation Age of Onset   Uterine cancer Mother    Parkinson's disease Father    Stroke Father    Colon cancer Brother    Lung cancer Brother    COPD Sister    Diabetes Grandchild    Asthma Son     Social History:  Social History   Tobacco Use   Smoking status: Never    Passive exposure: Never   Smokeless tobacco: Never  Vaping Use   Vaping Use: Never used  Substance Use Topics   Alcohol use: No    Alcohol/week: 0.0 standard drinks of alcohol   Drug use: Never    ROS: Constitutional:  Negative for fever, chills, weight loss CV: Negative for chest pain, previous MI, hypertension Respiratory:  Negative for shortness of breath, wheezing, sleep apnea, frequent cough GI:  Negative for nausea, vomiting, bloody stool, GERD  Physical exam: There were no vitals taken for this visit. GENERAL APPEARANCE:  Well appearing, well developed, well nourished, NAD HEENT:  Atraumatic, normocephalic, oropharynx clear NECK:  Supple without lymphadenopathy or thyromegaly ABDOMEN:  Soft, non-tender, no masses EXTREMITIES:  Moves all extremities well, without clubbing, cyanosis, or edema NEUROLOGIC:  Alert and oriented x 3, normal gait, CN II-XII grossly intact MENTAL STATUS:  appropriate BACK:  Non-tender to palpation, No CVAT SKIN:  Warm, dry, and intact   Results: U/A:

## 2021-10-03 ENCOUNTER — Ambulatory Visit (INDEPENDENT_AMBULATORY_CARE_PROVIDER_SITE_OTHER): Payer: Medicare Other | Admitting: Urology

## 2021-10-03 ENCOUNTER — Encounter: Payer: Self-pay | Admitting: Urology

## 2021-10-03 ENCOUNTER — Ambulatory Visit (INDEPENDENT_AMBULATORY_CARE_PROVIDER_SITE_OTHER): Payer: Medicare Other | Admitting: Gastroenterology

## 2021-10-03 ENCOUNTER — Encounter (INDEPENDENT_AMBULATORY_CARE_PROVIDER_SITE_OTHER): Payer: Self-pay | Admitting: Gastroenterology

## 2021-10-03 VITALS — BP 147/72 | HR 71

## 2021-10-03 VITALS — BP 132/71 | HR 67 | Temp 97.9°F | Ht 61.5 in | Wt 131.1 lb

## 2021-10-03 DIAGNOSIS — Z87440 Personal history of urinary (tract) infections: Secondary | ICD-10-CM | POA: Diagnosis not present

## 2021-10-03 DIAGNOSIS — N3941 Urge incontinence: Secondary | ICD-10-CM | POA: Diagnosis not present

## 2021-10-03 DIAGNOSIS — K219 Gastro-esophageal reflux disease without esophagitis: Secondary | ICD-10-CM

## 2021-10-03 DIAGNOSIS — K582 Mixed irritable bowel syndrome: Secondary | ICD-10-CM | POA: Diagnosis not present

## 2021-10-03 DIAGNOSIS — R110 Nausea: Secondary | ICD-10-CM

## 2021-10-03 DIAGNOSIS — N200 Calculus of kidney: Secondary | ICD-10-CM | POA: Diagnosis not present

## 2021-10-03 DIAGNOSIS — R1319 Other dysphagia: Secondary | ICD-10-CM

## 2021-10-03 DIAGNOSIS — D509 Iron deficiency anemia, unspecified: Secondary | ICD-10-CM

## 2021-10-03 LAB — URINALYSIS, ROUTINE W REFLEX MICROSCOPIC
Bilirubin, UA: NEGATIVE
Glucose, UA: NEGATIVE
Ketones, UA: NEGATIVE
Leukocytes,UA: NEGATIVE
Nitrite, UA: NEGATIVE
Protein,UA: NEGATIVE
Specific Gravity, UA: 1.02 (ref 1.005–1.030)
Urobilinogen, Ur: 0.2 mg/dL (ref 0.2–1.0)
pH, UA: 5 (ref 5.0–7.5)

## 2021-10-03 LAB — MICROSCOPIC EXAMINATION
Bacteria, UA: NONE SEEN
Renal Epithel, UA: NONE SEEN /hpf

## 2021-10-03 MED ORDER — FERROUS FUMARATE 324 (106 FE) MG PO TABS
1.0000 | ORAL_TABLET | Freq: Every day | ORAL | 1 refills | Status: DC
Start: 2021-10-03 — End: 2022-10-08

## 2021-10-03 MED ORDER — GEMTESA 75 MG PO TABS: 75.0000 mg | ORAL_TABLET | Freq: Every day | ORAL | 11 refills | Status: DC

## 2021-10-03 MED ORDER — OMEPRAZOLE 40 MG PO CPDR: 40.0000 mg | DELAYED_RELEASE_CAPSULE | Freq: Every day | ORAL | 1 refills | Status: DC

## 2021-10-03 NOTE — Progress Notes (Signed)
Referring Provider: London Pepper, MD Primary Care Physician:  London Pepper, MD Primary GI Physician: Jenetta Downer  Chief Complaint  Patient presents with   Gastroesophageal Reflux    3 month follow up on GERD, dysphagia, weight loss, IBS and IDA. States dysphagia is better since having esophagus stretched. States she still gets choked at times but not as much. Has diarrhea and constipation. No change from last visit. Has gained some weight now that she is not getting choked as much. Takes iron once daily for IDA.    HPI:   Heather Espinoza is a 76 y.o. female with past medical history of  anemia, COPD, DM, diverticulitis, Fibromyalgia, high cholesterol, HTN, IBS, osteopenia, RLS.    Patient presenting today for follow up.  Last seen July 03 2021, appetite up and down at that time though she had gained about 8 pounds. Endorsing more gas, bloating and abdominal cramping. Taking bentyl BID, occasionally TID. BMs sporadic, with up to 6 per day some days and other days with none. GERD well controlled on nexium '40mg'$  daily and famotidine '40mg'$  QHS. occasional dysphagia with thicker foods and pills. Maintained on ferrous fumarate '324mg'$  daily for ongoing IDA with last hgb 10.8, in January 2023. Was previously supposed to have esophagram but this was not completed. TTP of LLQ on exam with hx of diverticulitis. CT A/P done for further evaluation, this was unremarkable. Esophagram done in April with findings as below, patient underwent EGD thereafter, also outlined below    Present:  IDA: chronic, previous work up was unremarkable, suspected secondary to decreased iron absorption due to chronic acid suppression. Currently on otc iron supplement, ferrous gluconate '240mg'$  daily, denies rectal bleeding or melena. She does have chronic fatigue. SOB only if she is out in the heat. Denies dizziness. Hemoglobin in April 9.6, MCV 93.8, Iron 77, TIBC 364, Sat 21%, Ferritin 19  GERD: some burning in mid abdomen  around umbilicus with some burning in her chest/throat, occurring maybe 2-3x/week. Is maintained on nexium '40mg'$  once daily and famotidine '40mg'$  QHS. Occasionally burning will wake her up at night. Denies cough or sore throat, though does have some hoarseness. Has occasional nausea but no vomiting. Taking zofran for this as needed, nausea occurs a few times per week, this is also a chronic issue. No vomiting. Denies early satiety.   Dysphagia: improved since EGD in May, having issues maybe 2-3 times per month, occasionally with certain foods or pills but overall much better. She tries to avoid thicker, dryer foods. Feels that appetite has improved since EGD as swallowing is better.   IBS: she has occasional abdominal pain, mostly in LLQ, she is still taking bentyl BID, occasionally will take it TID though not often. She continues with mix of constipation and looser stools. Sometimes will have multiple stools per day, maybe up to 6-7x and sometimes will skip a few days. Stools may start as little hard stool balls then can transition to more watery stools, this is baseline for her.   Weight loss: up 14 lbs since January, appetite seems to be better since EGD as she is able to swallow better  esophagram April 2023: Protrusion of cricopharyngeus muscle noted with swallowing. Implanted device in place to upper right chest. Last Colonoscopy:07/24/19- Decreased sphincter tone found on digital rectal exam. - Diverticulosis in the entire examined colon. - One 10 mm polyp at the hepatic flexure, removed piecemeal using a cold snare. Complete resection. Partial retrieval. Clips (MR conditional) were placed. Tubular adenoma -  One small polyp at the splenic flexure, removed with a cold snare. Resected and retrieved. -benign colonic mucosa - External hemorrhoids. Last Endoscopy:08/02/21- Normal hypopharynx. - Web in the proximal esophagus. - Z-line irregular, 34 cm from the incisors. - 2 cm hiatal hernia. -  Multiple gastric polyps. - Erythematous mucosa in the prepyloric region of the stomach. - Duodenal lipoma. - Normal second portion of the duodenum. - No specimens collected.  Recommendations:    Past Medical History:  Diagnosis Date   Anemia    Arthritis    COPD (chronic obstructive pulmonary disease) ([place])    COVID-19 11/11/2020   Diabetes ([place])    Diverticulitis    Diverticulosis    Dyspnea    Fibromyalgia    High cholesterol    History of kidney stones    Hypertension    Hypothyroid    IBS (irritable bowel syndrome)    Osteopenia    Pneumonia    Restless leg    Sleep apnea    Doesn't use CPAP.     Tachycardia    per pt/fim   Tremor, essential 01/07/2018    Past Surgical History:  Procedure Laterality Date   ABDOMINAL HYSTERECTOMY     APPENDECTOMY     BACK SURGERY     BIOPSY  12/25/2016   Procedure: BIOPSY;  Surgeon: Rogene Houston, MD;  [facility];  Service: Endoscopy;;  gastric    BREAST LUMPECTOMY Right 03/20/1999   CATARACT EXTRACTION Bilateral 03/19/2014   CHOLECYSTECTOMY     COLONOSCOPY N/A 12/17/2012   Procedure: COLONOSCOPY;  Surgeon: Rogene Houston, MD;  [facility];  Service: Endoscopy;  Laterality: N/A;  215   COLONOSCOPY WITH PROPOFOL N/A 07/24/2019   Procedure: COLONOSCOPY WITH PROPOFOL;  Surgeon: Rogene Houston, MD;  [facility];  Service: Endoscopy;  Laterality: N/A;  730   complete hysterectomy     DRUG INDUCED ENDOSCOPY N/A 09/02/2020   Procedure: DRUG INDUCED ENDOSCOPY;  Surgeon: Jerrell Belfast, MD;  Location: [place];  Service: ENT;  Laterality: N/A;   ESOPHAGEAL DILATION N/A 12/25/2016   Procedure: ESOPHAGEAL DILATION;  Surgeon: Rogene Houston, MD;  [facility];  Service: Endoscopy;  Laterality: N/A;   ESOPHAGEAL DILATION N/A 07/24/2019   Procedure: ESOPHAGEAL DILATION;  Surgeon: Rogene Houston, MD;  [facility];  Service: Endoscopy;  Laterality: N/A;    ESOPHAGEAL DILATION N/A 08/02/2021   Procedure: ESOPHAGEAL DILATION;  Surgeon: Rogene Houston, MD;  [facility];  Service: Endoscopy;  Laterality: N/A;   ESOPHAGOGASTRODUODENOSCOPY N/A 12/25/2016   Procedure: ESOPHAGOGASTRODUODENOSCOPY (EGD);  Surgeon: Rogene Houston, MD;  [facility];  Service: Endoscopy;  Laterality: N/A;  730   ESOPHAGOGASTRODUODENOSCOPY (EGD) WITH PROPOFOL N/A 07/24/2019   Procedure: ESOPHAGOGASTRODUODENOSCOPY (EGD) WITH PROPOFOL;  Surgeon: Rogene Houston, MD;  [facility];  Service: Endoscopy;  Laterality: N/A;   ESOPHAGOGASTRODUODENOSCOPY (EGD) WITH PROPOFOL N/A 08/02/2021   Procedure: ESOPHAGOGASTRODUODENOSCOPY (EGD) WITH PROPOFOL;  Surgeon: Rogene Houston, MD;  [facility];  Service: Endoscopy;  Laterality: N/A;  1245 ASA 2   EXTRACORPOREAL SHOCK WAVE LITHOTRIPSY Left 02/21/2021   Procedure: EXTRACORPOREAL SHOCK WAVE LITHOTRIPSY (ESWL);  Surgeon: Primus Bravo., MD;  [facility];  Service: Urology;  Laterality: Left;   Heel tumor removed     IMPLANTATION OF HYPOGLOSSAL NERVE STIMULATOR Right 11/25/2020   Procedure: IMPLANTATION OF HYPOGLOSSAL NERVE STIMULATOR;  Surgeon: Jerrell Belfast, MD;  [facility];  Service: ENT;  Laterality: Right;   POLYPECTOMY  07/24/2019   Procedure: POLYPECTOMY;  Surgeon: Rogene Houston, MD;  [facility];  Service: Endoscopy;;   rt elbow surgery     THUMB FUSION Bilateral    TONSILLECTOMY      Current Outpatient Medications  Medication Sig Dispense Refill   acetaminophen (TYLENOL) 500 MG tablet Take 1,000 mg by mouth every 6 (six) hours as needed for moderate pain.     albuterol (PROVENTIL HFA;VENTOLIN HFA) 108 (90 Base) MCG/ACT inhaler Inhale 2 puffs into the lungs every 6 (six) hours as needed for wheezing or shortness of breath.     Biotin w/ Vitamins C & E (HAIR SKIN & NAILS GUMMIES PO) Take 2 tablets by mouth daily.     cephALEXin (KEFLEX) 500 MG  capsule Take 1 capsule (500 mg total) by mouth daily. 30 capsule 2   Cholecalciferol (VITAMIN D-3) 25 MCG (1000 UT) CAPS Take 1,000 Units by mouth in the morning.     Cyanocobalamin (B-12) 1000 MCG CAPS Take 1,000 mcg by mouth in the morning.     dicyclomine (BENTYL) 10 MG capsule Take 1 capsule (10 mg total) by mouth 3 (three) times daily before meals. 180 capsule 1   docusate sodium (COLACE) 100 MG capsule Take 2 capsules (200 mg total) by mouth at bedtime. (Patient taking differently: Take 200 mg by mouth at bedtime as needed (constipation.).) 10 capsule 0   DULoxetine (CYMBALTA) 60 MG capsule Take 60 mg by mouth in the morning.     esomeprazole (NEXIUM) 40 MG capsule Take 1 capsule (40 mg total) by mouth daily before breakfast. 90 capsule 3   famotidine (PEPCID) 40 MG tablet Take 1 tablet (40 mg total) by mouth at bedtime. 90 tablet 3   ferrous gluconate (FERGON) 240 (27 FE) MG tablet Take 240 mg by mouth every evening.     Fluticasone-Salmeterol (ADVAIR) 250-50 MCG/DOSE AEPB Inhale 1 puff into the lungs every 12 (twelve) hours.     gabapentin (NEURONTIN) 100 MG capsule Take 100 mg by mouth 3 (three) times daily.     levothyroxine (SYNTHROID, LEVOTHROID) 75 MCG tablet Take 37.5 mcg by mouth daily before breakfast.     lisinopril (PRINIVIL,ZESTRIL) 10 MG tablet Take 10 mg by mouth in the morning.     METAMUCIL FIBER PO Take 1-3 each by mouth daily. Gummies     metFORMIN (GLUCOPHAGE-XR) 500 MG 24 hr tablet Take 500 mg by mouth daily before supper.     mupirocin ointment (BACTROBAN) 2 % Apply 1 application. topically 2 (two) times daily as needed (skin breakdown).     neomycin-bacitracin-polymyxin (NEOSPORIN) ointment Apply 1 application topically as needed for wound care.     ondansetron (ZOFRAN) 4 MG tablet TAKE 1 TABLET BY MOUTH TWICE DAILY AS NEEDED FOR NAUSEA OR VOMITING 30 tablet 0   simvastatin (ZOCOR) 20 MG tablet Take 20 mg by mouth every evening.     traMADol (ULTRAM) 50 MG tablet Take  50-100 mg by mouth every 6 (six) hours as needed for pain.     Ferrous Fumarate (HEMOCYTE - 106 MG FE) 324 (106 Fe) MG TABS tablet Take 1 tablet (106 mg of iron total) by mouth daily with breakfast. (Patient not taking: Reported on 10/03/2021) 90 tablet 1   traZODone (DESYREL) 100 MG tablet Take 50-100 mg by mouth at bedtime.     trospium (SANCTURA) 20 MG tablet Take 1 tablet (20 mg total) by  mouth 2 (two) times daily. 60 tablet 11   vitamin E 180 MG (400 UNITS) capsule Take 400 Units by mouth in the morning.     No current facility-administered medications for this visit.    Allergies as of 10/03/2021 - Review Complete 10/03/2021  Allergen Reaction Noted   Flagyl [metronidazole] Shortness Of Breath and Swelling 10/07/2015   Lyrica [pregabalin] Shortness Of Breath 01/04/2015   Adhesive [tape] Other (See Comments) 12/02/2012   Aspirin Other (See Comments) 11/24/2012   Latex Itching and Rash 11/24/2012   Phenergan [promethazine hcl] Other (See Comments) 10/07/2015   Prednisone Anxiety 11/24/2012   Sulfa antibiotics Nausea Only 10/07/2015   Tetanus toxoids Swelling 11/24/2012   Wound dressing adhesive Rash 05/07/2021    Family History  Problem Relation Age of Onset   Uterine cancer Mother    Parkinson's disease Father    Stroke Father    Colon cancer Brother    Lung cancer Brother    COPD Sister    Diabetes Grandchild    Asthma Son     Social History   Socioeconomic History   Marital status: Married    Spouse name: Not on file   Number of children: 2   Years of education: College   Highest education level: Associate degree: academic program  Occupational History   Occupation: Retired  Tobacco Use   Smoking status: Never    Passive exposure: Never   Smokeless tobacco: Never  Scientific laboratory technician Use: Never used  Substance and Sexual Activity   Alcohol use: No    Alcohol/week: 0.0 standard drinks of alcohol   Drug use: Never   Sexual activity: Not Currently  Other  Topics Concern   Not on file  Social History Narrative   Lives at home w/ her husband   Right-handed   Occasional caffeine: decaf tea, diet sodas 2 daily   Social Determinants of Radio broadcast assistant Strain: Not on file  Food Insecurity: Not on file  Transportation Needs: Not on file  Physical Activity: Not on file  Stress: Not on file  Social Connections: Not on file   Review of systems General: negative for malaise, night sweats, fever, chills, weight loss Neck: Negative for lumps, goiter, pain and significant neck swelling Resp: Negative for cough, wheezing, dyspnea at rest CV: Negative for chest pain, leg swelling, palpitations, orthopnea GI: denies melena, hematochezia,  odyonophagia, early satiety or unintentional weight loss. +constipation +diarrhea +LLQ pain +occasional dysphagia +nausea  MSK: Negative for joint pain or swelling, back pain, and muscle pain. Derm: Negative for itching or rash Psych: Denies depression, anxiety, memory loss, confusion. No homicidal or suicidal ideation.  Heme: Negative for prolonged bleeding, bruising easily, and swollen nodes. Endocrine: Negative for cold or heat intolerance, polyuria, polydipsia and goiter. Neuro: negative for tremor, gait imbalance, syncope and seizures. The remainder of the review of systems is noncontributory.  Physical Exam: There were no vitals taken for this visit. General:   Alert and oriented. No distress noted. Pleasant and cooperative.  Head:  Normocephalic and atraumatic. Eyes:  Conjuctiva clear without scleral icterus. Mouth:  Oral mucosa pink and moist. Good dentition. No lesions. Heart: Normal rate and rhythm, s1 and s2 heart sounds present.  Lungs: Clear lung sounds in all lobes. Respirations equal and unlabored. Abdomen:  +BS, soft, non-tender and non-distended. No rebound or guarding. No HSM or masses noted. Derm: No palmar erythema or jaundice Msk:  Symmetrical without gross deformities. Normal  posture. Extremities:  Without edema. Neurologic:  Alert and  oriented x4 Psych:  Alert and cooperative. Normal mood and affect.  Invalid input(s): "6 MONTHS"   ASSESSMENT: Heather Espinoza is a 76 y.o. female presenting today for follow up of IBS, GERD, Dysphagia, weight loss and IDA.   GERD:burning in throat and upper abdomen, sometimes symptoms will wake her up at night. Also with some hoarseness. Will d/c nexium and start omeprazole '40mg'$  daily, continue famotidine '40mg'$  QHS. Avoid greasy, spicy, fried, citrus foods, and be mindful that caffeine, carbonated drinks, chocolate and alcohol can increase reflux symptoms. Stay upright 2-3 hours after eating, prior to lying down and avoid eating late in the evenings.  IBS: continued with intermittent LLQ pain, diarrhea and constipation, taking bentyl BID-TID with good results. Should continue with current regimen  Weight loss: up 14 lbs since January, eating much better since EGD and improvement with dysphagia.   Dysphagia: improved since EGD, having issues only very occasionally with larger pills or thicker, dryer foods, tries to avoid these. Should continue with chewing precautions and staying away from foods that tend to cause her issues.   IDA: chronic, endoscopic workup previously negative for source. No rectal bleeding or melena. Last Iron studies in April WNL. She is taking otc iron, will restart her on ferrous fumarate daily. She will make me aware of worsening fatigue, sob or dizziness.    PLAN:  Rx Omeprazole '40mg'$  daily, stop Nexium  2. Continue Famotidine '40mg'$  QHS  3. Reflux precautions 4. Continue zofran as needed 5. Continue ferrous fumarate daily, Rx sent  6. Chewing precautions  All questions were answered, patient verbalized understanding and is in agreement with plan as outlined above.   Follow Up: 6 MONTHS  Latarra Eagleton L. Alver Sorrow, MSN, APRN, AGNP-C Adult-Gerontology Nurse Practitioner [facility]

## 2021-10-03 NOTE — Progress Notes (Signed)
Assessment: 1. Urge incontinence   2. Left nephrolithiasis   3. History of UTI     Plan: Continue daily cephalexin. Continue Gemtesa 75 mg daily.  Samples and prescription provided. Other medications for management of urge incontinence are contraindicated due to potential QT prolongation (including Myrbetriq).  She failed a trial of trospium. Return to office in 3 months  Chief Complaint:  Chief Complaint  Patient presents with   Urinary Incontinence    History of Present Illness:  Heather Espinoza is a 76 y.o. year old female who is seen for further evaluation of a left renal calculus, history of UTI, and urge incontinence.  She was admitted to the hospital in October 2022 for pyelonephritis.  She has a history of recurrent UTIs getting 3-4 UTIs per year.  CT scan obtained during the hospital admission showed a 1 cm left lower pole calculus with mild fullness of the lower pole.  Urine culture grew E. coli.  He was treated with culture appropriate antibiotics.  She was seen by Dr. Alyson Ingles while in the hospital.  Treatment options for the left renal calculus were discussed including ureteroscopy and shockwave lithotripsy. She completed her antibiotics.  She was not having any UTI symptoms at her visit on 01/30/21.  No flank pain.  No dysuria or gross hematuria. Urine culture from 01/30/21 grew >100 K E. Coli.  Treated with Cefdinir x 7 days. KUB from 01/30/21 showed 9 mm calcification in lower left renal shadow.  She underwent left ESL on 02/21/2021. KUB from 04/04/2021 showed the left lower pole calcification decreased in size to approximately 6 mm. She did well following the procedure.  She was not aware of passing any large stone fragments.  She continued on daily cephalexin for UTI prevention. Urine culture from 1/23 showed <10K colonies. She was given a trial of Gemtesa for symptoms of urge incontinence.  Renal ultrasound from 05/18/2021 showed no evidence of renal mass or  hydronephrosis.  At her visit in March 2023, she continued on daily cephalexin.  No recent UTI symptoms.  No flank pain.  No dysuria or gross hematuria.  She reported significant improvement in her urge incontinence with the Gemtesa.  No side effects. At her visit in June 2023, she had run out of the daily cephalexin.  She was not having any dysuria or gross hematuria.  She was having frequency, urgency, and urge incontinence.  She was unable to afford the [facility] and was on trospium.  She did not feel like this medication controlled her symptoms. Urine culture grew >100 K E. coli.  She was treated with cefdinir and restarted on daily cephalexin. She was also restarted on Gemtesa 75 mg daily.  She returns today for follow-up.  She continues on daily cephalexin and Gemtesa.  No UTI symptoms.  She feels like the [place] is controlling her symptoms well.  She reports occasional episodes of urge incontinence.  She has decreased frequency, urgency, and nocturia.  No side effects.  No dysuria or gross hematuria.  No recent stone symptoms.    Portions of the above documentation were copied from a prior visit for review purposes only.   Past Medical History:  Past Medical History:  Diagnosis Date   Anemia    Arthritis    COPD (chronic obstructive pulmonary disease) ([place])    COVID-19 11/11/2020   Diabetes ([place])    Diverticulitis    Diverticulosis    Dyspnea    Fibromyalgia    High cholesterol  History of kidney stones    Hypertension    Hypothyroid    IBS (irritable bowel syndrome)    Osteopenia    Pneumonia    Restless leg    Sleep apnea    Doesn't use CPAP.     Tachycardia    per pt/fim   Tremor, essential 01/07/2018    Past Surgical History:  Past Surgical History:  Procedure Laterality Date   ABDOMINAL HYSTERECTOMY     APPENDECTOMY     BACK SURGERY     BIOPSY  12/25/2016   Procedure: BIOPSY;  Surgeon: Rogene Houston, MD;  [facility];  Service: Endoscopy;;   gastric    BREAST LUMPECTOMY Right 03/20/1999   CATARACT EXTRACTION Bilateral 03/19/2014   CHOLECYSTECTOMY     COLONOSCOPY N/A 12/17/2012   Procedure: COLONOSCOPY;  Surgeon: Rogene Houston, MD;  [facility];  Service: Endoscopy;  Laterality: N/A;  215   COLONOSCOPY WITH PROPOFOL N/A 07/24/2019   Procedure: COLONOSCOPY WITH PROPOFOL;  Surgeon: Rogene Houston, MD;  [facility];  Service: Endoscopy;  Laterality: N/A;  730   complete hysterectomy     DRUG INDUCED ENDOSCOPY N/A 09/02/2020   Procedure: DRUG INDUCED ENDOSCOPY;  Surgeon: Jerrell Belfast, MD;  Location: [place];  Service: ENT;  Laterality: N/A;   ESOPHAGEAL DILATION N/A 12/25/2016   Procedure: ESOPHAGEAL DILATION;  Surgeon: Rogene Houston, MD;  [facility];  Service: Endoscopy;  Laterality: N/A;   ESOPHAGEAL DILATION N/A 07/24/2019   Procedure: ESOPHAGEAL DILATION;  Surgeon: Rogene Houston, MD;  [facility];  Service: Endoscopy;  Laterality: N/A;   ESOPHAGEAL DILATION N/A 08/02/2021   Procedure: ESOPHAGEAL DILATION;  Surgeon: Rogene Houston, MD;  [facility];  Service: Endoscopy;  Laterality: N/A;   ESOPHAGOGASTRODUODENOSCOPY N/A 12/25/2016   Procedure: ESOPHAGOGASTRODUODENOSCOPY (EGD);  Surgeon: Rogene Houston, MD;  [facility];  Service: Endoscopy;  Laterality: N/A;  730   ESOPHAGOGASTRODUODENOSCOPY (EGD) WITH PROPOFOL N/A 07/24/2019   Procedure: ESOPHAGOGASTRODUODENOSCOPY (EGD) WITH PROPOFOL;  Surgeon: Rogene Houston, MD;  [facility];  Service: Endoscopy;  Laterality: N/A;   ESOPHAGOGASTRODUODENOSCOPY (EGD) WITH PROPOFOL N/A 08/02/2021   Procedure: ESOPHAGOGASTRODUODENOSCOPY (EGD) WITH PROPOFOL;  Surgeon: Rogene Houston, MD;  [facility];  Service: Endoscopy;  Laterality: N/A;  1245 ASA 2   EXTRACORPOREAL SHOCK WAVE LITHOTRIPSY Left 02/21/2021   Procedure: EXTRACORPOREAL SHOCK WAVE LITHOTRIPSY (ESWL);   Surgeon: Primus Bravo., MD;  [facility];  Service: Urology;  Laterality: Left;   Heel tumor removed     IMPLANTATION OF HYPOGLOSSAL NERVE STIMULATOR Right 11/25/2020   Procedure: IMPLANTATION OF HYPOGLOSSAL NERVE STIMULATOR;  Surgeon: Jerrell Belfast, MD;  Location: [place];  Service: ENT;  Laterality: Right;   POLYPECTOMY  07/24/2019   Procedure: POLYPECTOMY;  Surgeon: Rogene Houston, MD;  [facility];  Service: Endoscopy;;   rt elbow surgery     THUMB FUSION Bilateral    TONSILLECTOMY      Allergies:  Allergies  Allergen Reactions   Flagyl [Metronidazole] Shortness Of Breath and Swelling   Lyrica [Pregabalin] Shortness Of Breath   Adhesive [Tape] Other (See Comments)    Takes off patient's skin   Aspirin Other (See Comments)    Stomach bleed   Latex Itching and Rash   Phenergan [Promethazine Hcl] Other (See Comments)    Caused grogginess, altered mental status   Prednisone Anxiety  Sulfa Antibiotics Nausea Only   Tetanus Toxoids Swelling    Arm Area   Wound Dressing Adhesive Rash    Family History:  Family History  Problem Relation Age of Onset   Uterine cancer Mother    Parkinson's disease Father    Stroke Father    Colon cancer Brother    Lung cancer Brother    COPD Sister    Diabetes Grandchild    Asthma Son     Social History:  Social History   Tobacco Use   Smoking status: Never    Passive exposure: Never   Smokeless tobacco: Never  Vaping Use   Vaping Use: Never used  Substance Use Topics   Alcohol use: No    Alcohol/week: 0.0 standard drinks of alcohol   Drug use: Never    ROS: Constitutional:  Negative for fever, chills, weight loss CV: Negative for chest pain, previous MI, hypertension Respiratory:  Negative for shortness of breath, wheezing, sleep apnea, frequent cough GI:  Negative for nausea, vomiting, bloody stool, GERD  Physical exam: BP (!) 147/72   Pulse 71  GENERAL APPEARANCE:  Well appearing, well  developed, well nourished, NAD HEENT:  Atraumatic, normocephalic, oropharynx clear NECK:  Supple without lymphadenopathy or thyromegaly ABDOMEN:  Soft, non-tender, no masses EXTREMITIES:  Moves all extremities well, without clubbing, cyanosis, or edema NEUROLOGIC:  Alert and oriented x 3, normal gait, CN II-XII grossly intact MENTAL STATUS:  appropriate BACK:  Non-tender to palpation, No CVAT SKIN:  Warm, dry, and intact   Results: U/A: 0-5 WBC, 0-2 RBC

## 2021-10-03 NOTE — Patient Instructions (Addendum)
I have sent Omeprazole '40mg'$  daily to your pharmacy (STOP ESOMEPRAZOLE/NEXIUM)  Please take this 30 minutes prior to breakfast Avoid greasy, spicy, fried, citrus foods, and be mindful that caffeine, carbonated drinks, chocolate and alcohol can increase reflux symptoms Stay upright 2-3 hours after eating, prior to lying down and avoid eating late in the evenings.  -Continue Famotidine '40MG'$  nightly -Continue zofran as needed for nausea -Continue ferrous fumarate daily, refill sent, please stop over the counter iron -continue dicyclomine 2-3x/day as needed  You can let me know how the burning and hoarseness are in about 1 month

## 2021-10-11 ENCOUNTER — Other Ambulatory Visit (INDEPENDENT_AMBULATORY_CARE_PROVIDER_SITE_OTHER): Payer: Self-pay | Admitting: Gastroenterology

## 2021-10-11 NOTE — Telephone Encounter (Signed)
Last OV 10/03/21

## 2021-10-25 ENCOUNTER — Ambulatory Visit: Payer: Medicare Other | Admitting: Pulmonary Disease

## 2021-11-24 ENCOUNTER — Ambulatory Visit: Payer: Medicare Other | Admitting: Pulmonary Disease

## 2021-12-12 ENCOUNTER — Other Ambulatory Visit: Payer: Self-pay | Admitting: Urology

## 2021-12-12 ENCOUNTER — Other Ambulatory Visit (INDEPENDENT_AMBULATORY_CARE_PROVIDER_SITE_OTHER): Payer: Self-pay | Admitting: Gastroenterology

## 2021-12-12 DIAGNOSIS — R8290 Unspecified abnormal findings in urine: Secondary | ICD-10-CM

## 2021-12-18 ENCOUNTER — Other Ambulatory Visit (INDEPENDENT_AMBULATORY_CARE_PROVIDER_SITE_OTHER): Payer: Self-pay | Admitting: Gastroenterology

## 2021-12-18 DIAGNOSIS — K582 Mixed irritable bowel syndrome: Secondary | ICD-10-CM

## 2021-12-19 ENCOUNTER — Telehealth (INDEPENDENT_AMBULATORY_CARE_PROVIDER_SITE_OTHER): Payer: Self-pay | Admitting: *Deleted

## 2021-12-19 NOTE — Telephone Encounter (Signed)
error 

## 2021-12-19 NOTE — Telephone Encounter (Signed)
Fax from [place]. Dicyclomine capsule was approved from 03/19/21 -12/18/22. Letter of approval faxed to pharmacy.

## 2021-12-20 ENCOUNTER — Ambulatory Visit: Payer: Medicare Other | Admitting: Pulmonary Disease

## 2021-12-20 ENCOUNTER — Other Ambulatory Visit (INDEPENDENT_AMBULATORY_CARE_PROVIDER_SITE_OTHER): Payer: Self-pay | Admitting: Gastroenterology

## 2021-12-20 DIAGNOSIS — K582 Mixed irritable bowel syndrome: Secondary | ICD-10-CM

## 2021-12-29 ENCOUNTER — Other Ambulatory Visit: Payer: Self-pay | Admitting: Urology

## 2021-12-29 DIAGNOSIS — N3941 Urge incontinence: Secondary | ICD-10-CM

## 2021-12-29 MED ORDER — GEMTESA 75 MG PO TABS: 75.0000 mg | ORAL_TABLET | Freq: Every day | ORAL | 11 refills | Status: DC

## 2022-01-04 ENCOUNTER — Ambulatory Visit (INDEPENDENT_AMBULATORY_CARE_PROVIDER_SITE_OTHER): Payer: Medicare Other | Admitting: Urology

## 2022-01-04 ENCOUNTER — Encounter: Payer: Self-pay | Admitting: Urology

## 2022-01-04 VITALS — BP 124/79 | HR 69 | Ht 61.5 in | Wt 131.0 lb

## 2022-01-04 DIAGNOSIS — N3941 Urge incontinence: Secondary | ICD-10-CM

## 2022-01-04 DIAGNOSIS — Z87442 Personal history of urinary calculi: Secondary | ICD-10-CM | POA: Diagnosis not present

## 2022-01-04 DIAGNOSIS — N200 Calculus of kidney: Secondary | ICD-10-CM

## 2022-01-04 DIAGNOSIS — Z87440 Personal history of urinary (tract) infections: Secondary | ICD-10-CM | POA: Diagnosis not present

## 2022-01-04 LAB — URINALYSIS, ROUTINE W REFLEX MICROSCOPIC
Bilirubin, UA: NEGATIVE
Glucose, UA: NEGATIVE
Ketones, UA: NEGATIVE
Leukocytes,UA: NEGATIVE
Nitrite, UA: NEGATIVE
Protein,UA: NEGATIVE
RBC, UA: NEGATIVE
Specific Gravity, UA: 1.01 (ref 1.005–1.030)
Urobilinogen, Ur: 0.2 mg/dL (ref 0.2–1.0)
pH, UA: 5.5 (ref 5.0–7.5)

## 2022-01-04 NOTE — Progress Notes (Signed)
Assessment: 1. Urge incontinence   2. Left nephrolithiasis   3. History of UTI      Plan: Continue daily cephalexin. Continue Gemtesa 75 mg daily.  Samples and prescription provided. Other medications for management of urge incontinence are contraindicated due to potential QT prolongation (including Myrbetriq).  She failed a trial of trospium. I provided her with information on other treatment modalities for urge incontinence including PTNS and Botox. Return to office in 3 months  Chief Complaint:  Chief Complaint  Patient presents with   Urinary Incontinence    History of Present Illness:  Heather Espinoza is a 76 y.o. year old female who is seen for further evaluation of a left renal calculus, history of UTI, and urge incontinence.  She was admitted to the hospital in October 2022 for pyelonephritis.  She has a history of recurrent UTIs getting 3-4 UTIs per year.  CT scan obtained during the hospital admission showed a 1 cm left lower pole calculus with mild fullness of the lower pole.  Urine culture grew E. coli.  He was treated with culture appropriate antibiotics.  She was seen by Dr. Alyson Ingles while in the hospital.  Treatment options for the left renal calculus were discussed including ureteroscopy and shockwave lithotripsy. She completed her antibiotics.  She was not having any UTI symptoms at her visit on 01/30/21.  No flank pain.  No dysuria or gross hematuria. Urine culture from 01/30/21 grew >100 K E. Coli.  Treated with Cefdinir x 7 days. KUB from 01/30/21 showed 9 mm calcification in lower left renal shadow.  She underwent left ESL on 02/21/2021. KUB from 04/04/2021 showed the left lower pole calcification decreased in size to approximately 6 mm. She did well following the procedure.  She was not aware of passing any large stone fragments.  She continued on daily cephalexin for UTI prevention. Urine culture from 1/23 showed <10K colonies. She was given a trial of Gemtesa  for symptoms of urge incontinence.  Renal ultrasound from 05/18/2021 showed no evidence of renal mass or hydronephrosis.  At her visit in March 2023, she continued on daily cephalexin.  No recent UTI symptoms.  No flank pain.  No dysuria or gross hematuria.  She reported significant improvement in her urge incontinence with the Gemtesa.  No side effects. At her visit in June 2023, she had run out of the daily cephalexin.  She was not having any dysuria or gross hematuria.  She was having frequency, urgency, and urge incontinence.  She was unable to afford the [facility] and was on trospium.  She did not feel like this medication controlled her symptoms. Urine culture grew >100 K E. coli.  She was treated with cefdinir and restarted on daily cephalexin. She was also restarted on Gemtesa 75 mg daily.  She returns today for follow-up.  She continues on daily cephalexin and Gemtesa.  No recent UTI symptoms.  The Gemtesa controls her symptoms well.  She has only occasional episodes of incontinence.  No side effects from the medication.  Unfortunately, the medication is expensive and is not covered by her insurance.  Portions of the above documentation were copied from a prior visit for review purposes only.   Past Medical History:  Past Medical History:  Diagnosis Date   Anemia    Arthritis    COPD (chronic obstructive pulmonary disease) ([place])    COVID-19 11/11/2020   Diabetes ([facility])    Diverticulitis    Diverticulosis    Dyspnea  Fibromyalgia    High cholesterol    History of kidney stones    Hypertension    Hypothyroid    IBS (irritable bowel syndrome)    Osteopenia    Pneumonia    Restless leg    Sleep apnea    Doesn't use CPAP.     Tachycardia    per pt/fim   Tremor, essential 01/07/2018    Past Surgical History:  Past Surgical History:  Procedure Laterality Date   ABDOMINAL HYSTERECTOMY     APPENDECTOMY     BACK SURGERY     BIOPSY  12/25/2016   Procedure: BIOPSY;  Surgeon:  Rogene Houston, MD;  [facility];  Service: Endoscopy;;  gastric    BREAST LUMPECTOMY Right 03/20/1999   CATARACT EXTRACTION Bilateral 03/19/2014   CHOLECYSTECTOMY     COLONOSCOPY N/A 12/17/2012   Procedure: COLONOSCOPY;  Surgeon: Rogene Houston, MD;  [facility];  Service: Endoscopy;  Laterality: N/A;  215   COLONOSCOPY WITH PROPOFOL N/A 07/24/2019   Procedure: COLONOSCOPY WITH PROPOFOL;  Surgeon: Rogene Houston, MD;  [facility];  Service: Endoscopy;  Laterality: N/A;  730   complete hysterectomy     DRUG INDUCED ENDOSCOPY N/A 09/02/2020   Procedure: DRUG INDUCED ENDOSCOPY;  Surgeon: Jerrell Belfast, MD;  Location: [place];  Service: ENT;  Laterality: N/A;   ESOPHAGEAL DILATION N/A 12/25/2016   Procedure: ESOPHAGEAL DILATION;  Surgeon: Rogene Houston, MD;  [facility];  Service: Endoscopy;  Laterality: N/A;   ESOPHAGEAL DILATION N/A 07/24/2019   Procedure: ESOPHAGEAL DILATION;  Surgeon: Rogene Houston, MD;  [facility];  Service: Endoscopy;  Laterality: N/A;   ESOPHAGEAL DILATION N/A 08/02/2021   Procedure: ESOPHAGEAL DILATION;  Surgeon: Rogene Houston, MD;  [facility];  Service: Endoscopy;  Laterality: N/A;   ESOPHAGOGASTRODUODENOSCOPY N/A 12/25/2016   Procedure: ESOPHAGOGASTRODUODENOSCOPY (EGD);  Surgeon: Rogene Houston, MD;  [facility];  Service: Endoscopy;  Laterality: N/A;  730   ESOPHAGOGASTRODUODENOSCOPY (EGD) WITH PROPOFOL N/A 07/24/2019   Procedure: ESOPHAGOGASTRODUODENOSCOPY (EGD) WITH PROPOFOL;  Surgeon: Rogene Houston, MD;  [facility];  Service: Endoscopy;  Laterality: N/A;   ESOPHAGOGASTRODUODENOSCOPY (EGD) WITH PROPOFOL N/A 08/02/2021   Procedure: ESOPHAGOGASTRODUODENOSCOPY (EGD) WITH PROPOFOL;  Surgeon: Rogene Houston, MD;  [facility];  Service: Endoscopy;  Laterality: N/A;  1245 ASA 2   EXTRACORPOREAL SHOCK WAVE LITHOTRIPSY Left  02/21/2021   Procedure: EXTRACORPOREAL SHOCK WAVE LITHOTRIPSY (ESWL);  Surgeon: Primus Bravo., MD;  [facility];  Service: Urology;  Laterality: Left;   Heel tumor removed     IMPLANTATION OF HYPOGLOSSAL NERVE STIMULATOR Right 11/25/2020   Procedure: IMPLANTATION OF HYPOGLOSSAL NERVE STIMULATOR;  Surgeon: Jerrell Belfast, MD;  Location: [place];  Service: ENT;  Laterality: Right;   POLYPECTOMY  07/24/2019   Procedure: POLYPECTOMY;  Surgeon: Rogene Houston, MD;  [facility];  Service: Endoscopy;;   rt elbow surgery     THUMB FUSION Bilateral    TONSILLECTOMY      Allergies:  Allergies  Allergen Reactions   Flagyl [Metronidazole] Shortness Of Breath and Swelling   Lyrica [Pregabalin] Shortness Of Breath   Adhesive [Tape] Other (See Comments)    Takes off patient's skin   Aspirin Other (See Comments)    Stomach bleed   Latex Itching and Rash   Phenergan [Promethazine Hcl] Other (See Comments)    Caused  grogginess, altered mental status   Prednisone Anxiety   Sulfa Antibiotics Nausea Only   Tetanus Toxoids Swelling    Arm Area   Wound Dressing Adhesive Rash    Family History:  Family History  Problem Relation Age of Onset   Uterine cancer Mother    Parkinson's disease Father    Stroke Father    Colon cancer Brother    Lung cancer Brother    COPD Sister    Diabetes Grandchild    Asthma Son     Social History:  Social History   Tobacco Use   Smoking status: Never    Passive exposure: Never   Smokeless tobacco: Never  Vaping Use   Vaping Use: Never used  Substance Use Topics   Alcohol use: No    Alcohol/week: 0.0 standard drinks of alcohol   Drug use: Never    ROS: Constitutional:  Negative for fever, chills, weight loss CV: Negative for chest pain, previous MI, hypertension Respiratory:  Negative for shortness of breath, wheezing, sleep apnea, frequent cough GI:  Negative for nausea, vomiting, bloody stool, GERD  Physical exam: BP  124/79   Pulse 69   Ht 5' 1.5" (1.562 m)   Wt 131 lb (59.4 kg)   BMI 24.35 kg/m  GENERAL APPEARANCE:  Well appearing, well developed, well nourished, NAD HEENT:  Atraumatic, normocephalic, oropharynx clear NECK:  Supple without lymphadenopathy or thyromegaly ABDOMEN:  Soft, non-tender, no masses EXTREMITIES:  Moves all extremities well, without clubbing, cyanosis, or edema NEUROLOGIC:  Alert and oriented x 3, normal gait, CN II-XII grossly intact MENTAL STATUS:  appropriate BACK:  Non-tender to palpation, No CVAT SKIN:  Warm, dry, and intact  Results: U/A: Dipstick negative

## 2022-01-17 ENCOUNTER — Other Ambulatory Visit (INDEPENDENT_AMBULATORY_CARE_PROVIDER_SITE_OTHER): Payer: Self-pay | Admitting: Gastroenterology

## 2022-01-18 ENCOUNTER — Ambulatory Visit: Payer: Medicare Other | Admitting: Pulmonary Disease

## 2022-02-14 ENCOUNTER — Other Ambulatory Visit (INDEPENDENT_AMBULATORY_CARE_PROVIDER_SITE_OTHER): Payer: Self-pay | Admitting: Gastroenterology

## 2022-02-14 DIAGNOSIS — K582 Mixed irritable bowel syndrome: Secondary | ICD-10-CM

## 2022-02-26 ENCOUNTER — Other Ambulatory Visit: Payer: Self-pay | Admitting: Neurosurgery

## 2022-02-26 DIAGNOSIS — M4722 Other spondylosis with radiculopathy, cervical region: Secondary | ICD-10-CM

## 2022-03-16 ENCOUNTER — Ambulatory Visit
Admission: RE | Admit: 2022-03-16 | Discharge: 2022-03-16 | Disposition: A | Discharge status: Home / Self Care | Payer: Medicare Other | Source: Ambulatory Visit | Attending: Neurosurgery | Admitting: Neurosurgery

## 2022-03-16 DIAGNOSIS — M4722 Other spondylosis with radiculopathy, cervical region: Secondary | ICD-10-CM

## 2022-03-16 MED ORDER — IOPAMIDOL (ISOVUE-M 300) INJECTION 61%
10.0000 mL | Freq: Once | INTRAMUSCULAR | Status: AC
  Administered 2022-03-16: 10 mL via INTRATHECAL

## 2022-03-16 MED ORDER — ONDANSETRON HCL 4 MG/2ML IJ SOLN: 4.0000 mg | Freq: Once | INTRAMUSCULAR | Status: DC | PRN

## 2022-03-16 MED ORDER — MEPERIDINE HCL 50 MG/ML IJ SOLN: 50.0000 mg | Freq: Once | INTRAMUSCULAR | Status: DC | PRN

## 2022-03-16 MED ORDER — DIAZEPAM 5 MG PO TABS
5.0000 mg | ORAL_TABLET | Freq: Once | ORAL | Status: AC
  Administered 2022-03-16: 5 mg via ORAL

## 2022-03-16 NOTE — Discharge Instructions (Signed)

## 2022-03-26 ENCOUNTER — Ambulatory Visit: Payer: Medicare Other | Admitting: Pulmonary Disease

## 2022-04-05 ENCOUNTER — Ambulatory Visit (INDEPENDENT_AMBULATORY_CARE_PROVIDER_SITE_OTHER): Payer: Medicare Other | Admitting: Gastroenterology

## 2022-04-11 ENCOUNTER — Ambulatory Visit: Payer: Medicare Other | Admitting: Podiatry

## 2022-04-29 ENCOUNTER — Other Ambulatory Visit (INDEPENDENT_AMBULATORY_CARE_PROVIDER_SITE_OTHER): Payer: Self-pay | Admitting: Gastroenterology

## 2022-04-29 ENCOUNTER — Other Ambulatory Visit (INDEPENDENT_AMBULATORY_CARE_PROVIDER_SITE_OTHER): Payer: Self-pay | Admitting: Internal Medicine

## 2022-04-29 DIAGNOSIS — K582 Mixed irritable bowel syndrome: Secondary | ICD-10-CM

## 2022-05-09 ENCOUNTER — Ambulatory Visit (INDEPENDENT_AMBULATORY_CARE_PROVIDER_SITE_OTHER): Payer: Medicare Other | Admitting: Podiatry

## 2022-05-09 VITALS — BP 133/62 | HR 64

## 2022-05-09 DIAGNOSIS — E0843 Diabetes mellitus due to underlying condition with diabetic autonomic (poly)neuropathy: Secondary | ICD-10-CM

## 2022-05-09 DIAGNOSIS — M79674 Pain in right toe(s): Secondary | ICD-10-CM | POA: Diagnosis not present

## 2022-05-09 DIAGNOSIS — M79675 Pain in left toe(s): Secondary | ICD-10-CM | POA: Diagnosis not present

## 2022-05-09 DIAGNOSIS — B351 Tinea unguium: Secondary | ICD-10-CM

## 2022-05-09 DIAGNOSIS — M19079 Primary osteoarthritis, unspecified ankle and foot: Secondary | ICD-10-CM

## 2022-05-09 DIAGNOSIS — E119 Type 2 diabetes mellitus without complications: Secondary | ICD-10-CM | POA: Diagnosis not present

## 2022-05-09 MED ORDER — MELOXICAM 15 MG PO TABS: 15.0000 mg | ORAL_TABLET | Freq: Every day | ORAL | 1 refills | Status: DC

## 2022-05-09 NOTE — Progress Notes (Signed)
Chief Complaint  Patient presents with   Diabetes    Patient came in today for diabetic foot care,A1c- 5.7, BG- Not taking, Neuropathy, started 7 years ago, numbness burning [place]; Gabapentin     HPI: 77 y.o. female presenting today as a new patient for evaluation of her bilateral feet.  Patient states that she has seen a podiatrist in [place], [place] and she receives monthly injections around her ankles bilaterally for her "neuropathy".  She does have a longstanding history of neuropathy to the bilateral feet.  She experiences numbness with tingling sensation and loss of sensation to the bilateral feet.  She was unsatisfied with the care she received from her previous podiatrist and presents today for further treatment evaluation and to establish care with [place]  Past Medical History:  Diagnosis Date   Anemia    Arthritis    COPD (chronic obstructive pulmonary disease) ([place])    COVID-19 11/11/2020   Diabetes ([place])    Diverticulitis    Diverticulosis    Dyspnea    Fibromyalgia    High cholesterol    History of kidney stones    Hypertension    Hypothyroid    IBS (irritable bowel syndrome)    Osteopenia    Pneumonia    Restless leg    Sleep apnea    Doesn't use CPAP.     Tachycardia    per pt/fim   Tremor, essential 01/07/2018    Past Surgical History:  Procedure Laterality Date   ABDOMINAL HYSTERECTOMY     APPENDECTOMY     BACK SURGERY     BIOPSY  12/25/2016   Procedure: BIOPSY;  Surgeon: Rogene Houston, MD;  [facility];  Service: Endoscopy;;  gastric    BREAST LUMPECTOMY Right 03/20/1999   CATARACT EXTRACTION Bilateral 03/19/2014   CHOLECYSTECTOMY     COLONOSCOPY N/A 12/17/2012   Procedure: COLONOSCOPY;  Surgeon: Rogene Houston, MD;  [facility];  Service: Endoscopy;  Laterality: N/A;  215   COLONOSCOPY WITH PROPOFOL N/A 07/24/2019   Procedure: COLONOSCOPY WITH PROPOFOL;  Surgeon: Rogene Houston, MD;  [facility];  Service:  Endoscopy;  Laterality: N/A;  730   complete hysterectomy     DRUG INDUCED ENDOSCOPY N/A 09/02/2020   Procedure: DRUG INDUCED ENDOSCOPY;  Surgeon: Jerrell Belfast, MD;  Location: [place];  Service: ENT;  Laterality: N/A;   ESOPHAGEAL DILATION N/A 12/25/2016   Procedure: ESOPHAGEAL DILATION;  Surgeon: Rogene Houston, MD;  [facility];  Service: Endoscopy;  Laterality: N/A;   ESOPHAGEAL DILATION N/A 07/24/2019   Procedure: ESOPHAGEAL DILATION;  Surgeon: Rogene Houston, MD;  [facility];  Service: Endoscopy;  Laterality: N/A;   ESOPHAGEAL DILATION N/A 08/02/2021   Procedure: ESOPHAGEAL DILATION;  Surgeon: Rogene Houston, MD;  [facility];  Service: Endoscopy;  Laterality: N/A;   ESOPHAGOGASTRODUODENOSCOPY N/A 12/25/2016   Procedure: ESOPHAGOGASTRODUODENOSCOPY (EGD);  Surgeon: Rogene Houston, MD;  [facility];  Service: Endoscopy;  Laterality: N/A;  730   ESOPHAGOGASTRODUODENOSCOPY (EGD) WITH PROPOFOL N/A 07/24/2019   Procedure: ESOPHAGOGASTRODUODENOSCOPY (EGD) WITH PROPOFOL;  Surgeon: Rogene Houston, MD;  [facility];  Service: Endoscopy;  Laterality: N/A;   ESOPHAGOGASTRODUODENOSCOPY (EGD) WITH PROPOFOL N/A 08/02/2021   Procedure: ESOPHAGOGASTRODUODENOSCOPY (EGD) WITH PROPOFOL;  Surgeon: Rogene Houston, MD;  [facility];  Service: Endoscopy;  Laterality: N/A;  1245 ASA 2   EXTRACORPOREAL SHOCK WAVE  LITHOTRIPSY Left 02/21/2021   Procedure: EXTRACORPOREAL SHOCK WAVE LITHOTRIPSY (ESWL);  Surgeon: Primus Bravo., MD;  [facility];  Service: Urology;  Laterality: Left;   Heel tumor removed     IMPLANTATION OF HYPOGLOSSAL NERVE STIMULATOR Right 11/25/2020   Procedure: IMPLANTATION OF HYPOGLOSSAL NERVE STIMULATOR;  Surgeon: Jerrell Belfast, MD;  Location: [place];  Service: ENT;  Laterality: Right;   POLYPECTOMY  07/24/2019   Procedure: POLYPECTOMY;  Surgeon: Rogene Houston, MD;  [facility];  Service: Endoscopy;;   rt elbow surgery     THUMB FUSION Bilateral    TONSILLECTOMY      Allergies  Allergen Reactions   Flagyl [Metronidazole] Shortness Of Breath and Swelling   Lyrica [Pregabalin] Shortness Of Breath   Adhesive [Tape] Other (See Comments)    Takes off patient's skin   Aspirin Other (See Comments)    Stomach bleed   Latex Itching and Rash   Phenergan [Promethazine Hcl] Other (See Comments)    Caused grogginess, altered mental status   Prednisone Anxiety   Sulfa Antibiotics Nausea Only   Tetanus Toxoids Swelling    Arm Area   Wound Dressing Adhesive Rash     Physical Exam: General: The patient is alert and oriented x3 in no acute distress.  Dermatology: Skin is warm, dry and supple bilateral lower extremities. Negative for open lesions or macerations.  Vascular: Palpable pedal pulses bilaterally. Capillary refill within normal limits.  Negative for any significant edema or erythema  Neurological: Light touch and protective threshold diminished  Musculoskeletal Exam: No pedal deformities noted.  There is some tenderness with palpation along the posterior tibial tendons bilateral   Assessment: 1.  DMT2 with long history of peripheral polyneuropathy 2.  Encounter for diabetic foot exam 3.  Posterior tibial tendinitis bilateral 4.  Pain due to onychomycosis of toenails both   Plan of Care:  1. Patient evaluated. X-Rays reviewed.  2.  Comprehensive diabetic foot exam performed today 3.  Prescription for meloxicam 15 mg daily.  Cautioned against prolonged use of cortisone injections only spaced 1 month apart.  These may be more detrimental over a long period of time than beneficial 4.  Appointment with orthotics department for custom molded diabetic shoes and insoles 5.  Mechanical debridement of nails 1-5 bilateral was performed using a nail nipper without incident or bleeding  6.  Return to clinic 3 months for routine footcare      Edrick Kins, DPM [facility]  Dr. Edrick Kins, DPM    [address], [place] [address]                Office [phone]  Fax [phone]

## 2022-05-15 ENCOUNTER — Ambulatory Visit (INDEPENDENT_AMBULATORY_CARE_PROVIDER_SITE_OTHER): Payer: Medicare Other | Admitting: Gastroenterology

## 2022-05-15 ENCOUNTER — Telehealth (INDEPENDENT_AMBULATORY_CARE_PROVIDER_SITE_OTHER): Payer: Self-pay | Admitting: *Deleted

## 2022-05-15 ENCOUNTER — Encounter (INDEPENDENT_AMBULATORY_CARE_PROVIDER_SITE_OTHER): Payer: Self-pay | Admitting: Gastroenterology

## 2022-05-15 VITALS — BP 116/64 | HR 74 | Temp 98.8°F | Ht 61.5 in | Wt 121.1 lb

## 2022-05-15 DIAGNOSIS — K582 Mixed irritable bowel syndrome: Secondary | ICD-10-CM | POA: Diagnosis not present

## 2022-05-15 DIAGNOSIS — D509 Iron deficiency anemia, unspecified: Secondary | ICD-10-CM

## 2022-05-15 DIAGNOSIS — K219 Gastro-esophageal reflux disease without esophagitis: Secondary | ICD-10-CM

## 2022-05-15 DIAGNOSIS — R110 Nausea: Secondary | ICD-10-CM

## 2022-05-15 DIAGNOSIS — R634 Abnormal weight loss: Secondary | ICD-10-CM

## 2022-05-15 DIAGNOSIS — R6881 Early satiety: Secondary | ICD-10-CM

## 2022-05-15 MED ORDER — FAMOTIDINE 40 MG PO TABS: 40.0000 mg | ORAL_TABLET | Freq: Every day | ORAL | 3 refills | Status: DC

## 2022-05-15 MED ORDER — OMEPRAZOLE 40 MG PO CPDR: 40.0000 mg | DELAYED_RELEASE_CAPSULE | Freq: Every day | ORAL | 3 refills | Status: DC

## 2022-05-15 NOTE — Telephone Encounter (Signed)
LMOVM to call back to schedule EGD with Dr. Jenetta Downer, room 3, hold iron x 7 days, no metformin day of

## 2022-05-15 NOTE — Progress Notes (Unsigned)
Referring Provider: London Pepper, MD Primary Care Physician:  London Pepper, MD Primary GI Physician: Jenetta Downer   Chief Complaint  Patient presents with   Gastroesophageal Reflux    Follow up on GERD, IBS, and IDA. Has concerns about gas and indigestion. Reports she has been out of her meds for a couple of weeks now and needs refills on zofran, omeprazole, famotidine.    HPI:   Heather Espinoza is a 77 y.o. female with past medical history of anemia, COPD, DM, diverticulitis, Fibromyalgia, high cholesterol, HTN, IBS, osteopenia, RLS.    Patient presenting today for for follow up of GERD, IBS, IDA.  Last seen July 2023, at that timeon otc iron supplement, ferrous gluconate '240mg'$  daily, denies rectal bleeding or melena. Having chronic fatigue. SOB only if she is out in the heat. Denies dizziness. Hemoglobin in April 9.6, MCV 93.8, Iron 77, TIBC 364, Sat 21%, Ferritin 19 . Having some burning in mid abdomen and throat a few times per week, maintained on nexium '40mg'$  daily, famotidine '40mg'$  QHS. Having some hoarseness, occasional nausea, taking zofran as needed. Having occasional LLQ Pain, taking bentyl BID, occasional TID, having constipation and looser stools, up to 6-7x/day.   Recommended to start omeprazole '40mg'$  daily, stop nexium, continue famotidine '40mg'$  QHS, zofran PRN, continue daily iron.   Last iron studies WNL in April 2023.   Present:  Patient reports that she has been out of GERD meds x2 weeks. She is having some burning in her chest. She is unsure if she was taking nexium or omeprazole, does know she was taking famtodine nightly. Felt symptoms were well controlled on regimen.   She notes some intermittent nausea and vomiting, this began prior to being off of her GERD medication, maybe present for about 1 month, worse over the past few weeks. Per chart review has chronic nausea, taking zofran previously for this. She notes appetite is not great. Some foods make nausea worse. She  notes some early satiety as well. Down 10 pounds since her last visit.   Denies rectal bleeding or melena. She has some lower abdominal cramping. She still ranges from constipation to diarrhea. She can have 2-3 BMs per day, sometimes can go 2-3 days without a BM. She is taking dicyclomine '10mg'$  TID, does not think this helps much with her cramping. Sometimes pain improved with a BM but not always. She takes imodium when diarrhea becomes bad. Her husband inquires about having the "bad part of her bowel removed" to help alleviate her symptoms.   She has not had any recent lab work done. Has appt Thursday with Dr. Orland Mustard. Denies rectal bleeding or melena. She is taking iron pills usually at night, though sometimes skips these as they cause GI upset, taking 3-4x/week. Denies dizziness, but having some fatigue with exertion.   She has had some SOB but is seeing pulmonary next month for this   esophagram April 2023: Protrusion of cricopharyngeus muscle noted with swallowing. Implanted device in place to upper right chest. Last Colonoscopy:07/24/19- Decreased sphincter tone found on digital rectal exam. - Diverticulosis in the entire examined colon. - One 10 mm polyp at the hepatic flexure, removed piecemeal using a cold snare. Complete resection. Partial retrieval. Clips (MR conditional) were placed. Tubular adenoma - One small polyp at the splenic flexure, removed with a cold snare. Resected and retrieved. -benign colonic mucosa - External hemorrhoids. Last Endoscopy:08/02/21- Normal hypopharynx. - Web in the proximal esophagus. - Z-line irregular, 34 cm from the incisors. -  2 cm hiatal hernia. - Multiple gastric polyps. - Erythematous mucosa in the prepyloric region of the stomach. 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 - Normal second portion of the duodenum. - No specimens collected.  Repeat Colonoscopy in 2026  Past Medical History:  Diagnosis Date   Anemia    Arthritis    COPD (chronic  obstructive pulmonary disease) ([place])    COVID-19 11/11/2020   Diabetes ([place])    Diverticulitis    Diverticulosis    Dyspnea    Fibromyalgia    High cholesterol    History of kidney stones    Hypertension    Hypothyroid    IBS (irritable bowel syndrome)    Osteopenia    Pneumonia    Restless leg    Sleep apnea    Doesn't use CPAP.     Tachycardia    per pt/fim   Tremor, essential 01/07/2018    Past Surgical History:  Procedure Laterality Date   ABDOMINAL HYSTERECTOMY     APPENDECTOMY     BACK SURGERY     BIOPSY  12/25/2016   Procedure: BIOPSY;  Surgeon: Rogene Houston, MD;  [facility];  Service: Endoscopy;;  gastric    BREAST LUMPECTOMY Right 03/20/1999   CATARACT EXTRACTION Bilateral 03/19/2014   CHOLECYSTECTOMY     COLONOSCOPY N/A 12/17/2012   Procedure: COLONOSCOPY;  Surgeon: Rogene Houston, MD;  [facility];  Service: Endoscopy;  Laterality: N/A;  215   COLONOSCOPY WITH PROPOFOL N/A 07/24/2019   Procedure: COLONOSCOPY WITH PROPOFOL;  Surgeon: Rogene Houston, MD;  [facility];  Service: Endoscopy;  Laterality: N/A;  730   complete hysterectomy     DRUG INDUCED ENDOSCOPY N/A 09/02/2020   Procedure: DRUG INDUCED ENDOSCOPY;  Surgeon: Jerrell Belfast, MD;  Location: [facility];  Service: ENT;  Laterality: N/A;   ESOPHAGEAL DILATION N/A 12/25/2016   Procedure: ESOPHAGEAL DILATION;  Surgeon: Rogene Houston, MD;  [facility];  Service: Endoscopy;  Laterality: N/A;   ESOPHAGEAL DILATION N/A 07/24/2019   Procedure: ESOPHAGEAL DILATION;  Surgeon: Rogene Houston, MD;  [facility];  Service: Endoscopy;  Laterality: N/A;   ESOPHAGEAL DILATION N/A 08/02/2021   Procedure: ESOPHAGEAL DILATION;  Surgeon: Rogene Houston, MD;  [facility];  Service: Endoscopy;  Laterality: N/A;   ESOPHAGOGASTRODUODENOSCOPY N/A 12/25/2016   Procedure: ESOPHAGOGASTRODUODENOSCOPY (EGD);  Surgeon: Rogene Houston, MD;  [facility];  Service: Endoscopy;  Laterality: N/A;  730   ESOPHAGOGASTRODUODENOSCOPY (EGD) WITH PROPOFOL N/A 07/24/2019   Procedure: ESOPHAGOGASTRODUODENOSCOPY (EGD) WITH PROPOFOL;  Surgeon: Rogene Houston, MD;  [facility];  Service: Endoscopy;  Laterality: N/A;   ESOPHAGOGASTRODUODENOSCOPY (EGD) WITH PROPOFOL N/A 08/02/2021   Procedure: ESOPHAGOGASTRODUODENOSCOPY (EGD) WITH PROPOFOL;  Surgeon: Rogene Houston, MD;  [facility];  Service: Endoscopy;  Laterality: N/A;  1245 ASA 2   EXTRACORPOREAL SHOCK WAVE LITHOTRIPSY Left 02/21/2021   Procedure: EXTRACORPOREAL SHOCK WAVE LITHOTRIPSY (ESWL);  Surgeon: Primus Bravo., MD;  [facility];  Service: Urology;  Laterality: Left;   Heel tumor removed     IMPLANTATION OF HYPOGLOSSAL NERVE STIMULATOR Right 11/25/2020   Procedure: IMPLANTATION OF HYPOGLOSSAL NERVE STIMULATOR;  Surgeon: Jerrell Belfast, MD;  Location: [facility];  Service: ENT;  Laterality: Right;   POLYPECTOMY  07/24/2019   Procedure: POLYPECTOMY;  Surgeon: Rogene Houston, MD;  [facility];  Service: Endoscopy;;   rt elbow surgery  THUMB FUSION Bilateral    TONSILLECTOMY      Current Outpatient Medications  Medication Sig Dispense Refill   acetaminophen (TYLENOL) 500 MG tablet Take 1,000 mg by mouth every 6 (six) hours as needed for moderate pain.     albuterol (PROVENTIL HFA;VENTOLIN HFA) 108 (90 Base) MCG/ACT inhaler Inhale 2 puffs into the lungs every 6 (six) hours as needed for wheezing or shortness of breath.     Biotin w/ Vitamins C & E (HAIR SKIN & NAILS GUMMIES PO) Take 2 tablets by mouth daily.     cephALEXin (KEFLEX) 500 MG capsule Take 1 capsule by mouth once daily 30 capsule 3   Cholecalciferol (VITAMIN D-3) 25 MCG (1000 UT) CAPS Take 1,000 Units by mouth in the morning.     Cyanocobalamin (B-12) 1000 MCG CAPS Take 1,000 mcg by mouth in the morning.     dicyclomine (BENTYL) 10 MG capsule TAKE 1  CAPSULE BY MOUTH THREE TIMES DAILY BEFORE MEAL(S) 180 capsule 0   docusate sodium (COLACE) 100 MG capsule Take 2 capsules (200 mg total) by mouth at bedtime. (Patient taking differently: Take 200 mg by mouth at bedtime as needed (constipation.).) 10 capsule 0   DULoxetine (CYMBALTA) 60 MG capsule Take 60 mg by mouth in the morning.     famotidine (PEPCID) 40 MG tablet Take 1 tablet (40 mg total) by mouth at bedtime. 90 tablet 3   Ferrous Fumarate (HEMOCYTE - 106 MG FE) 324 (106 Fe) MG TABS tablet Take 1 tablet (106 mg of iron total) by mouth daily with breakfast. 90 tablet 1   Fluticasone-Salmeterol (ADVAIR) 250-50 MCG/DOSE AEPB Inhale 1 puff into the lungs every 12 (twelve) hours.     levothyroxine (SYNTHROID, LEVOTHROID) 75 MCG tablet Take 37.5 mcg by mouth daily before breakfast.     lisinopril (PRINIVIL,ZESTRIL) 10 MG tablet Take 10 mg by mouth in the morning.     meloxicam (MOBIC) 15 MG tablet Take 1 tablet (15 mg total) by mouth daily. 30 tablet 1   METAMUCIL FIBER PO Take 1-3 each by mouth daily. Gummies     metFORMIN (GLUCOPHAGE-XR) 500 MG 24 hr tablet Take 500 mg by mouth daily before supper.     mupirocin ointment (BACTROBAN) 2 % Apply 1 application. topically 2 (two) times daily as needed (skin breakdown).     neomycin-bacitracin-polymyxin (NEOSPORIN) ointment Apply 1 application topically as needed for wound care.     omeprazole (PRILOSEC) 40 MG capsule Take 1 capsule by mouth once daily 90 capsule 0   ondansetron (ZOFRAN) 4 MG tablet TAKE 1 TABLET BY MOUTH TWICE DAILY AS NEEDED FOR NAUSEA OR VOMITING 30 tablet 0   simvastatin (ZOCOR) 20 MG tablet Take 20 mg by mouth every evening.     traMADol (ULTRAM) 50 MG tablet Take 50-100 mg by mouth every 6 (six) hours as needed for pain.     traZODone (DESYREL) 100 MG tablet Take 50-100 mg by mouth at bedtime.     vitamin E 180 MG (400 UNITS) capsule Take 400 Units by mouth in the morning.     gabapentin (NEURONTIN) 100 MG capsule Take 300 mg  by mouth 3 (three) times daily. (Patient not taking: Reported on 05/15/2022)     Vibegron (GEMTESA) 75 MG TABS Take 75 mg by mouth daily. (Patient not taking: Reported on 05/15/2022) 30 tablet 11   No current facility-administered medications for this visit.    Allergies as of 05/15/2022 - Review Complete 05/15/2022  Allergen Reaction Noted  Flagyl [metronidazole] Shortness Of Breath and Swelling 10/07/2015   Lyrica [pregabalin] Shortness Of Breath 01/04/2015   Adhesive [tape] Other (See Comments) 12/02/2012   Aspirin Other (See Comments) 11/24/2012   Latex Itching and Rash 11/24/2012   Phenergan [promethazine hcl] Other (See Comments) 10/07/2015   Prednisone Anxiety 11/24/2012   Sulfa antibiotics Nausea Only 10/07/2015   Tetanus toxoids Swelling 11/24/2012   Wound dressing adhesive Rash 05/07/2021    Family History  Problem Relation Age of Onset   Uterine cancer Mother    Parkinson's disease Father    Stroke Father    Colon cancer Brother    Lung cancer Brother    COPD Sister    Diabetes Grandchild    Asthma Son     Social History   Socioeconomic History   Marital status: Married    Spouse name: Not on file   Number of children: 2   Years of education: College   Highest education level: Associate degree: academic program  Occupational History   Occupation: Retired  Tobacco Use   Smoking status: Never    Passive exposure: Never   Smokeless tobacco: Never  Scientific laboratory technician Use: Never used  Substance and Sexual Activity   Alcohol use: No    Alcohol/week: 0.0 standard drinks of alcohol   Drug use: Never   Sexual activity: Not Currently  Other Topics Concern   Not on file  Social History Narrative   Lives at home w/ her husband   Right-handed   Occasional caffeine: decaf tea, diet sodas 2 daily   Social Determinants of Radio broadcast assistant Strain: Not on file  Food Insecurity: Not on file  Transportation Needs: Not on file  Physical Activity: Not  on file  Stress: Not on file  Social Connections: Not on file    Review of systems General: negative for malaise, night sweats, fever, chills, weight los Neck: Negative for lumps, goiter, pain and significant neck swelling Resp: Negative for cough, wheezing, dyspnea at rest CV: Negative for chest pain, leg swelling, palpitations, orthopnea GI: denies melena, hematochezia, nausea, vomiting, diarrhea, constipation, dysphagia, odyonophagia, early satiety or unintentional weight loss.  MSK: Negative for joint pain or swelling, back pain, and muscle pain. Derm: Negative for itching or rash Psych: Denies depression, anxiety, memory loss, confusion. No homicidal or suicidal ideation.  Heme: Negative for prolonged bleeding, bruising easily, and swollen nodes. Endocrine: Negative for cold or heat intolerance, polyuria, polydipsia and goiter. Neuro: negative for tremor, gait imbalance, syncope and seizures. The remainder of the review of systems is noncontributory.  Physical Exam: BP 116/64 (BP Location: Right Arm, Patient Position: Sitting, Cuff Size: Normal)   Pulse 74   Temp 98.8 F (37.1 C) (Oral)   Ht 5' 1.5" (1.562 m)   Wt 121 lb 1.6 oz (54.9 kg)   BMI 22.51 kg/m  General:   Alert and oriented. No distress noted. Pleasant and cooperative.  Head:  Normocephalic and atraumatic. Eyes:  Conjuctiva clear without scleral icterus. Mouth:  Oral mucosa pink and moist. Good dentition. No lesions. Heart: Normal rate and rhythm, s1 and s2 heart sounds present.  Lungs: Clear lung sounds in all lobes. Respirations equal and unlabored. Abdomen:  +BS, soft, non-tender and non-distended. No rebound or guarding. No HSM or masses noted. Derm: No palmar erythema or jaundice Msk:  Symmetrical without gross deformities. Normal posture. Extremities:  Without edema. Neurologic:  Alert and  oriented x4 Psych:  Alert and cooperative. Normal mood and  affect.  Invalid input(s): "6 MONTHS"    ASSESSMENT: NAVEY SLEDZ is a 77 y.o. female presenting today for follow up of GERD, IBS, IDA.  GERD:  IBS:  IDA:    PLAN:  h&h/iron studies  2. Restart omeprazole '40mg'$  daily, Famotidine '40mg'$  QHS  3. Schedule EGD, ASA III ENDO 3, colonoscopy if iron studies low  4. Continue bentyl '10mg'$  TID with meals 5. Start daily probiotic  6. Low FODMAP  7. Continue on oral iron  All questions were answered, patient verbalized understanding and is in agreement with plan as outlined above.     Follow Up: 3 months   Shany Marinez L. Alver Sorrow, MSN, APRN, AGNP-C Adult-Gerontology Nurse Practitioner [facility]

## 2022-05-15 NOTE — Patient Instructions (Addendum)
Please restart omeprazole '40mg'$  daily Restart famotidine '40mg'$  nightly  Avoid greasy, spicy, fried, citrus foods, and be mindful that caffeine, carbonated drinks, chocolate and alcohol can increase reflux symptoms Stay upright 2-3 hours after eating, prior to lying down and avoid eating late in the evenings.  We will get you scheduled for upper endoscopy for further evaluation of your nausea/vomiting, weight loss  Continue to use dicyclomine '10mg'$  up to three times per day for abdominal cramping I am providing some probiotics you can try which may help with your IBS I am also providing the low fodmap diet which can be helpful in managing IBS  Follow up 3 months

## 2022-05-16 ENCOUNTER — Ambulatory Visit (INDEPENDENT_AMBULATORY_CARE_PROVIDER_SITE_OTHER): Payer: Medicare Other

## 2022-05-16 DIAGNOSIS — R6881 Early satiety: Secondary | ICD-10-CM | POA: Insufficient documentation

## 2022-05-16 LAB — IRON,TIBC AND FERRITIN PANEL
%SAT: 21 % (calc) (ref 16–45)
Ferritin: 36 ng/mL (ref 16–288)
Iron: 73 ug/dL (ref 45–160)
TIBC: 340 mcg/dL (calc) (ref 250–450)

## 2022-05-16 LAB — HEMOGLOBIN AND HEMATOCRIT, BLOOD
HCT: 31.3 % — ABNORMAL LOW (ref 35.0–45.0)
Hemoglobin: 10.7 g/dL — ABNORMAL LOW (ref 11.7–15.5)

## 2022-05-16 NOTE — Progress Notes (Signed)
Patient presents to the office today for diabetic shoe and insole measuring.  Patient was measured with brannock device to determine size and width for 1 pair of extra depth shoes and foam casted for 3 pair of insoles.   ABN signed.   Documentation of medical necessity will be sent to patient's treating diabetic doctor to verify and sign.   Patient's diabetic provider: London Pepper NPI: [id number]   Shoes and insoles will be ordered at that time and patient will be notified for an appointment for fitting when they arrive.   Brannock measurement: 5.520M  Patient shoe selection-   1st   Shoe choice:   [id number]  Shoe size ordered: 20M

## 2022-05-20 NOTE — Addendum Note (Signed)
Addended by: Edrick Kins on: 05/20/2022 12:19 PM   Modules accepted: Orders

## 2022-05-21 ENCOUNTER — Telehealth (INDEPENDENT_AMBULATORY_CARE_PROVIDER_SITE_OTHER): Payer: Self-pay | Admitting: *Deleted

## 2022-05-21 NOTE — Telephone Encounter (Signed)
I called patient about lab results and she was asking about scheduling her EGD. I told her a message had been left for her to return call on 2/27 to schedule. She said call her back on [phone]

## 2022-05-22 NOTE — Telephone Encounter (Signed)
Cheron Every, CMA7 days ago    LMOVM to call back to schedule EGD with Dr. Jenetta Downer, room 3, hold iron x 7 days, no metformin day of

## 2022-05-30 NOTE — Telephone Encounter (Signed)
Left message to return call 

## 2022-06-05 ENCOUNTER — Ambulatory Visit: Payer: Medicare Other | Admitting: Pulmonary Disease

## 2022-06-05 NOTE — Telephone Encounter (Signed)
Left message to return call. Sent letter to pt.

## 2022-06-06 NOTE — Telephone Encounter (Signed)
Pt returned call. Pt states she has 5 pinched nerves in neck and is going to see surgeon in [place] on 06/29/22. Pt states she will give [place] a call back after she sees from surgeon to schedule EGD.

## 2022-06-06 NOTE — Telephone Encounter (Signed)
Thanks

## 2022-06-07 ENCOUNTER — Other Ambulatory Visit: Payer: Self-pay | Admitting: Neurosurgery

## 2022-06-15 IMAGING — DX DG ABDOMEN 1V
1 series · 1 of 1 positions shown · non-contrast
Comparison: KUB 02/21/2021, CT abdomen and pelvis 01/11/2021

CLINICAL DATA: Post ESWL left side [DATE].

EXAM:
ABDOMEN - 1 VIEW

[abdomen kub]
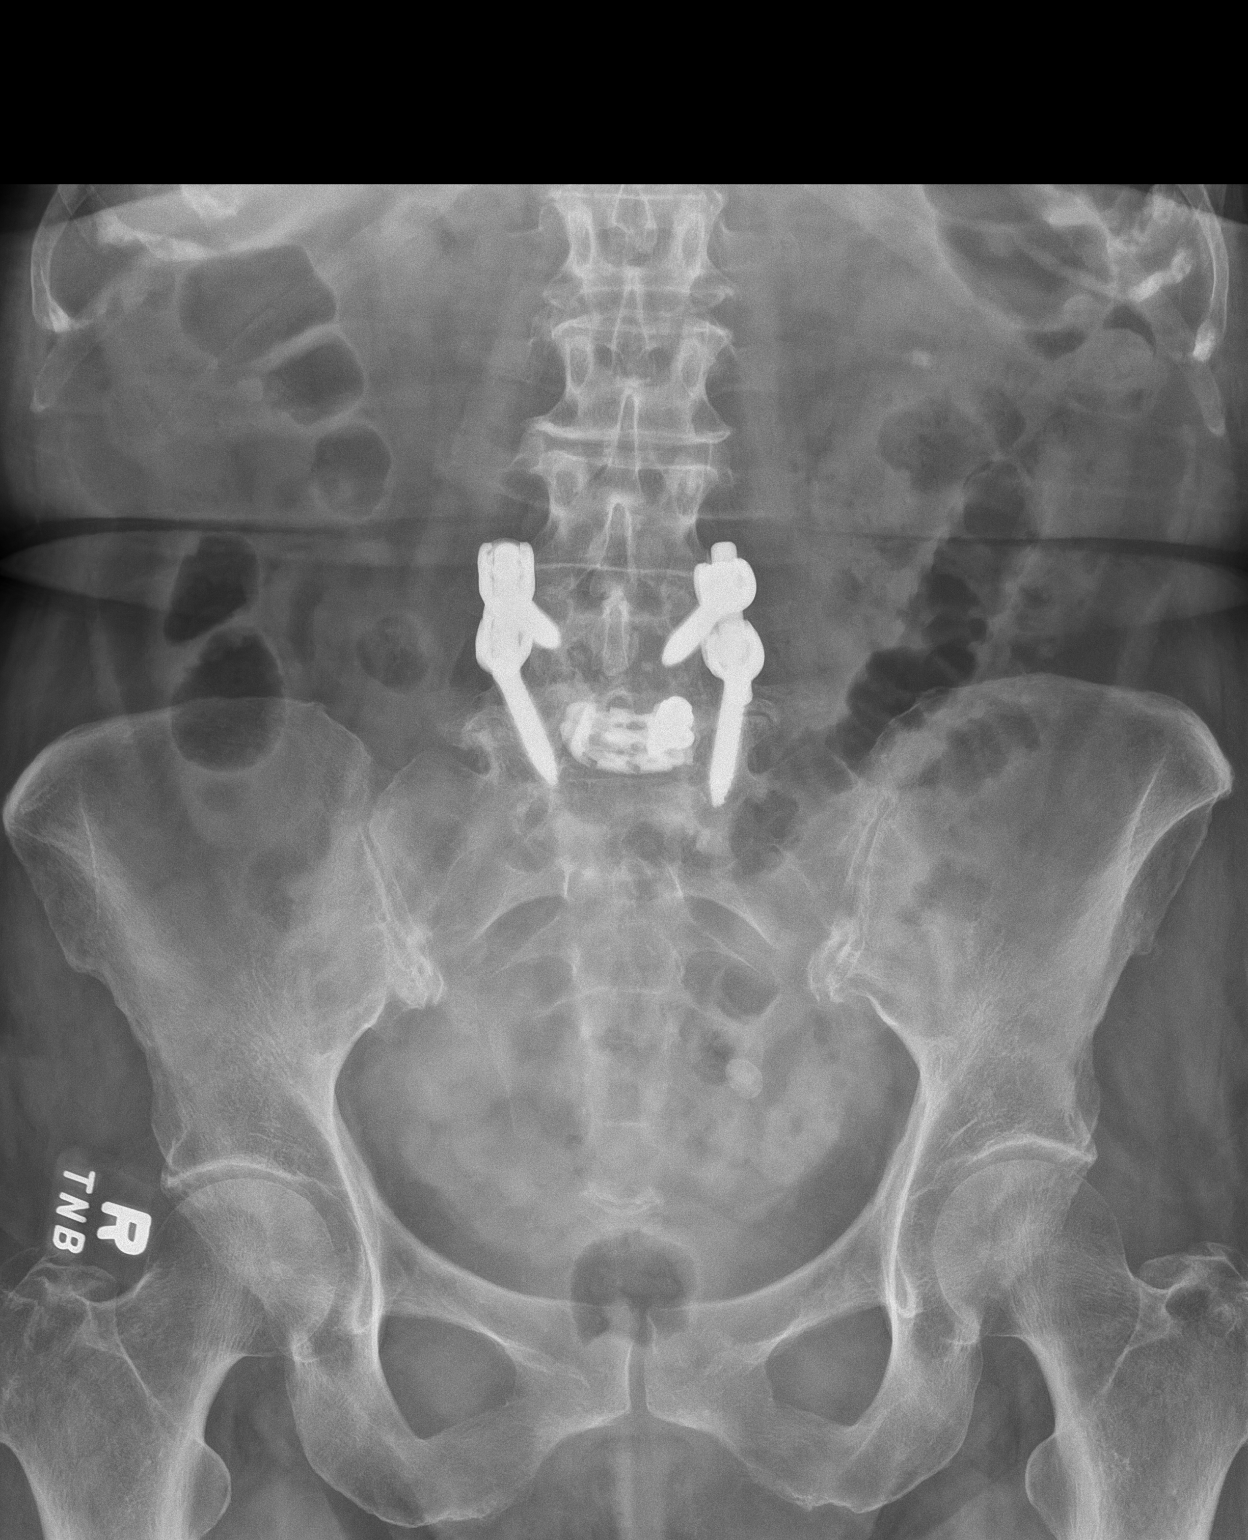

[1 of 1 positions shown; findings below may reference images not displayed]

FINDINGS: The previously seen 10 mm stone within lower pole of the left kidney
was seen on the prior KUB also measuring approximately 10 mm. This
now appears to measure 6 mm following lithotripsy.

Nonobstructed bowel-gas pattern.

There is an unchanged 12 mm oval calcific density overlying the left
hemipelvis which is localized posterior to the sigmoid colon on
prior CT. This may represent a vascular phlebolith or less likely
calcified diverticulum.
IMPRESSION: :
IMPRESSION: The prior left lower pole 10 mm renal stone now appears to measure 6
mm following interval lithotripsy.

## 2022-06-18 NOTE — Pre-Procedure Instructions (Signed)
Surgical Instructions    Your procedure is scheduled on June 25, 2022.  Report to [facility] Main Entrance "A" at 8:00 A.M., then check in with the Admitting office.  Call this number if you have problems the morning of surgery:  [phone]   If you have any questions prior to your surgery date call [phone]: Open Monday-Friday 8am-4pm If you experience any cold or flu symptoms such as cough, fever, chills, shortness of breath, etc. between now and your scheduled surgery, please notify us at the above number     Remember:  Do not eat or drink after midnight the night before your surgery    Take these medicines the morning of surgery with A SIP OF WATER:  DULoxetine (CYMBALTA)  Fluticasone-Salmeterol (ADVAIR  levothyroxine (SYNTHROID, LEVOTHROID)  omeprazole (PRILOSEC)   If needed: acetaminophen (TYLENOL)  albuterol (PROVENTIL HFA;VENTOLIN HFA) 108 (90 Base) MCG/ACT inhaler - Please bring all inhalers with you the day of surgery.   Stop taking Aspirin 5 days prior to surgery.   As of today, STOP taking any  Aleve, Naproxen, Ibuprofen, Motrin, Advil, Goody's, BC's, all herbal medications, fish oil, and all vitamins.   WHAT DO I DO ABOUT MY DIABETES MEDICATION?   Do not take oral diabetes medicines (pills) the morning of surgery. DO NOT TAKE Metformin (Glucophage) the day of surgery.  The day of surgery, do not take other diabetes injectables, including Byetta (exenatide), Bydureon (exenatide ER), Victoza (liraglutide), or Trulicity (dulaglutide).  If your CBG is greater than 220 mg/dL, you may take  of your sliding scale (correction) dose of insulin.   HOW TO MANAGE YOUR DIABETES BEFORE AND AFTER SURGERY  Why is it important to control my blood sugar before and after surgery? Improving blood sugar levels before and after surgery helps healing and can limit problems. A way of improving blood sugar control is eating a healthy diet by:  Eating less sugar and  carbohydrates  Increasing activity/exercise  Talking with your doctor about reaching your blood sugar goals High blood sugars (greater than 180 mg/dL) can raise your risk of infections and slow your recovery, so you will need to focus on controlling your diabetes during the weeks before surgery. Make sure that the doctor who takes care of your diabetes knows about your planned surgery including the date and location.  How do I manage my blood sugar before surgery? Check your blood sugar at least 4 times a day, starting 2 days before surgery, to make sure that the level is not too high or low.  Check your blood sugar the morning of your surgery when you wake up and every 2 hours until you get to the Short Stay unit.  If your blood sugar is less than 70 mg/dL, you will need to treat for low blood sugar: Do not take insulin. Treat a low blood sugar (less than 70 mg/dL) with  cup of clear juice (cranberry or apple), 4 glucose tablets, OR glucose gel. Recheck blood sugar in 15 minutes after treatment (to make sure it is greater than 70 mg/dL). If your blood sugar is not greater than 70 mg/dL on recheck, call [phone] for further instructions. Report your blood sugar to the short stay nurse when you get to Short Stay.  If you are admitted to the hospital after surgery: Your blood sugar will be checked by the staff and you will probably be given insulin after surgery (instead of oral diabetes medicines) to make sure you have good blood sugar  levels. The goal for blood sugar control after surgery is 80-180 mg/dL.    [facility] is not responsible for any belongings or valuables.    Do NOT Smoke (Tobacco/Vaping)  24 hours prior to your procedure  If you use a CPAP at night, you may bring your mask for your overnight stay.   Contacts, glasses, hearing aids, dentures or partials may not be worn into surgery, please bring cases for these belongings   For patients admitted to the hospital,  discharge time will be determined by your treatment team.   Patients discharged the day of surgery will not be allowed to drive home, and someone needs to stay with them for 24 hours.   SURGICAL WAITING ROOM VISITATION Patients having surgery or a procedure may have no more than 2 support people in the waiting area - these visitors may rotate.   Children under the age of 33 must have an adult with them who is not the patient. If the patient needs to stay at the hospital during part of their recovery, the visitor guidelines for inpatient rooms apply. Pre-op nurse will coordinate an appropriate time for 1 support person to accompany patient in pre-op.  This support person may not rotate.   Please refer to [email or web address] for the visitor guidelines for Inpatients (after your surgery is over and you are in a regular room).    Special instructions:    Oral Hygiene is also important to reduce your risk of infection.  Remember - BRUSH YOUR TEETH THE MORNING OF SURGERY WITH YOUR REGULAR TOOTHPASTE   [place]- Preparing For Surgery  Before surgery, you can play an important role. Because skin is not sterile, your skin needs to be as free of germs as possible. You can reduce the number of germs on your skin by washing with CHG (chlorahexidine gluconate) Soap before surgery.  CHG is an antiseptic cleaner which kills germs and bonds with the skin to continue killing germs even after washing.     Please do not use if you have an allergy to CHG or antibacterial soaps. If your skin becomes reddened/irritated stop using the CHG.  Do not shave (including legs and underarms) for at least 48 hours prior to first CHG shower. It is OK to shave your face.  Please follow these instructions carefully.     Shower the NIGHT BEFORE SURGERY and the MORNING OF SURGERY with CHG Soap.   If you chose to wash your hair, wash your hair first as usual with your  normal shampoo. After you shampoo, rinse your hair and body thoroughly to remove the shampoo.  Then [employer] and genitals (private parts) with your normal soap and rinse thoroughly to remove soap.  After that Use CHG Soap as you would any other liquid soap. You can apply CHG directly to the skin and wash gently with a scrungie or a clean washcloth.   Apply the CHG Soap to your body ONLY FROM THE NECK DOWN.  Do not use on open wounds or open sores. Avoid contact with your eyes, ears, mouth and genitals (private parts). Wash Face and genitals (private parts)  with your normal soap.   Wash thoroughly, paying special attention to the area where your surgery will be performed.  Thoroughly rinse your body with warm water from the neck down.  DO NOT shower/wash with your normal soap after using and rinsing off the CHG Soap.  Pat yourself dry with a CLEAN TOWEL.  [place]  to bed the night before surgery  Place CLEAN SHEETS on your bed the night before your surgery  DO NOT SLEEP WITH PETS.   Day of Surgery:  Take a shower with CHG soap. Wear Clean/Comfortable clothing the morning of surgery Do not wear jewelry or makeup. Do not wear lotions, powders, perfumes/cologne or deodorant. Do not shave 48 hours prior to surgery.  Men may shave face and neck. Do not bring valuables to the hospital. Do not wear nail polish, gel polish, artificial nails, or any other type of covering on natural nails (fingers and toes) If you have artificial nails or gel coating that need to be removed by a nail salon, please have this removed prior to surgery. Artificial nails or gel coating may interfere with anesthesia's ability to adequately monitor your vital signs.  Remember to brush your teeth WITH YOUR REGULAR TOOTHPASTE.    If you received a COVID test during your pre-op visit, it is requested that you wear a mask when out in public, stay away from anyone that may not be feeling well, and notify  your surgeon if you develop symptoms. If you have been in contact with anyone that has tested positive in the last 10 days, please notify your surgeon.    Please read over the following fact sheets that you were given.

## 2022-06-19 ENCOUNTER — Inpatient Hospital Stay (HOSPITAL_COMMUNITY)
Admission: RE | Admit: 2022-06-19 | Discharge: 2022-06-19 | Disposition: A | Discharge status: Home / Self Care | Payer: Medicare Other | Source: Ambulatory Visit

## 2022-06-19 NOTE — Progress Notes (Signed)
Spoke to patient, she had to reschedule her surgery due to husband having an injury.  Surgery was rescheduled from 06/25/2022 to 59/2024, but states she will have to re-schedule that as well as her husband will need surgery first. PAT appointment will need to be rescheduled.

## 2022-06-20 ENCOUNTER — Other Ambulatory Visit: Payer: Self-pay | Admitting: Neurosurgery

## 2022-06-20 ENCOUNTER — Other Ambulatory Visit (INDEPENDENT_AMBULATORY_CARE_PROVIDER_SITE_OTHER): Payer: Self-pay | Admitting: Gastroenterology

## 2022-06-20 DIAGNOSIS — K582 Mixed irritable bowel syndrome: Secondary | ICD-10-CM

## 2022-06-26 ENCOUNTER — Other Ambulatory Visit: Payer: Self-pay | Admitting: Podiatry

## 2022-06-26 DIAGNOSIS — M19079 Primary osteoarthritis, unspecified ankle and foot: Secondary | ICD-10-CM

## 2022-06-26 DIAGNOSIS — E0843 Diabetes mellitus due to underlying condition with diabetic autonomic (poly)neuropathy: Secondary | ICD-10-CM

## 2022-06-26 NOTE — Addendum Note (Signed)
Addended by: Felecia Shelling on: 06/26/2022 01:54 PM   Modules accepted: Orders

## 2022-07-26 ENCOUNTER — Inpatient Hospital Stay (HOSPITAL_COMMUNITY): Admission: RE | Admit: 2022-07-26 | Payer: Medicare Other | Source: Home / Self Care | Admitting: Neurosurgery

## 2022-07-26 SURGERY — ANTERIOR CERVICAL DECOMPRESSION/DISCECTOMY FUSION 4 LEVELS
Anesthesia: General

## 2022-08-16 ENCOUNTER — Ambulatory Visit (INDEPENDENT_AMBULATORY_CARE_PROVIDER_SITE_OTHER): Payer: Medicare Other | Admitting: Gastroenterology

## 2022-08-16 ENCOUNTER — Ambulatory Visit: Payer: Medicare Other | Admitting: Pulmonary Disease

## 2022-09-17 ENCOUNTER — Ambulatory Visit (HOSPITAL_BASED_OUTPATIENT_CLINIC_OR_DEPARTMENT_OTHER): Payer: Medicare Other | Admitting: Pulmonary Disease

## 2022-10-08 ENCOUNTER — Encounter (INDEPENDENT_AMBULATORY_CARE_PROVIDER_SITE_OTHER): Payer: Self-pay | Admitting: Gastroenterology

## 2022-10-08 ENCOUNTER — Ambulatory Visit (INDEPENDENT_AMBULATORY_CARE_PROVIDER_SITE_OTHER): Payer: Medicare Other | Admitting: Gastroenterology

## 2022-10-08 VITALS — BP 166/69 | HR 56 | Temp 98.0°F | Ht 61.5 in | Wt 122.6 lb

## 2022-10-08 DIAGNOSIS — K582 Mixed irritable bowel syndrome: Secondary | ICD-10-CM | POA: Diagnosis not present

## 2022-10-08 DIAGNOSIS — K219 Gastro-esophageal reflux disease without esophagitis: Secondary | ICD-10-CM | POA: Diagnosis not present

## 2022-10-08 DIAGNOSIS — R112 Nausea with vomiting, unspecified: Secondary | ICD-10-CM

## 2022-10-08 DIAGNOSIS — R6881 Early satiety: Secondary | ICD-10-CM | POA: Diagnosis not present

## 2022-10-08 DIAGNOSIS — D509 Iron deficiency anemia, unspecified: Secondary | ICD-10-CM | POA: Diagnosis not present

## 2022-10-08 DIAGNOSIS — R1319 Other dysphagia: Secondary | ICD-10-CM

## 2022-10-08 MED ORDER — POLYSACCHARIDE IRON COMPLEX 150 MG PO CAPS: 150.0000 mg | ORAL_CAPSULE | Freq: Every day | ORAL | 2 refills | Status: DC

## 2022-10-08 MED ORDER — ONDANSETRON HCL 4 MG PO TABS: 4.0000 mg | ORAL_TABLET | Freq: Three times a day (TID) | ORAL | 0 refills | Status: DC | PRN

## 2022-10-08 NOTE — Patient Instructions (Signed)
We will Schedule EGD for further evaluation of your upper abdominal pain and nausea/vomiting I have sent zofran for you to take up to every 8 hours for nausea and vomiting  Stop ferrous sulfate, start niferex daily, this is a more gentle form of iron and will hopefully cause less GI upset  Continue omeprazole 40mg  daily, famotidine 40mg  at bedtime You can continue with dicyclomine 10mg  up to three times per day for abdominal cramping/loose stools  Follow up 4 months  It was a pleasure to see you today. I want to create trusting relationships with patients and provide genuine, compassionate, and quality care. I truly value your feedback! please be on the lookout for a survey regarding your visit with me today. I appreciate your input about our visit and your time in completing this!    Karron Goens L. Jeanmarie Hubert, MSN, APRN, AGNP-C Adult-Gerontology Nurse Practitioner [facility]

## 2022-10-08 NOTE — Progress Notes (Addendum)
Referring Provider: Farris Has, MD Primary Care Physician:  Farris Has, MD Primary GI Physician: Dr. Levon Hedger   Chief Complaint  Patient presents with   Emesis    Follow up on nausea and vomiting. About the same as last visit. Would like rx for nausea med.    Irritable Bowel Syndrome    Follow up on IBS. Symptoms comes and goes. Also about the same as last visit.    Weight Loss    Follow up on weight loss. States lowest weight was 115 lb. Today 122 lbs. Doing some better with appetite. Cooking at home more now than before.    Anemia    Follow up on IDA. Not taking iron tablet regularly. Takes about twice a week due to it causing diarrhea.    HPI:   Heather Espinoza is a 77 y.o. female with past medical history of anemia, COPD, DM, diverticulitis, Fibromyalgia, high cholesterol, HTN, IBS, osteopenia, RLS.   Presenting today for follow up of nausea/vomiting, GERD, IBS, IDA   Last seen February 2024, at that time, having intermittent nausea and vomiting with history of the same, some early satiety, down 10 pounds since October. Some lower abdominal cramping, stools ranging from constipation to diarrhea.  Taking dicyclomine 10 mg 3 times daily but unsure if it helped much with her symptoms.  Taking iron pills usually taking 3-4 times a week  Recommended checking h&h, Iron studies, restart omeprazole 40mg  daily, famotidine 40mg  at bedtime, Schedule EGD/colonoscopy if iron low, continue bentyl 10mg  TID, daily probiotic, continue oral iron, low FODMAP.   Hgb 10.7, iron studies with iron 73, TIBC 340, Sat 21%, ferritin 36, advised to continue with PO iron  EGD was not scheduled   Present:  Patient states he her husband has had some significant health issues she has been caring for him which kept her from scheduling EGD.  She notes nausea and vomiting are worse, she is nauseated almost daily. She vomited yesterday but typically only has nausea. She has some epigastric pain that is  worsened by eating. She notes some early satiety at times as well. Denies rectal bleeding or melena. She endorses heartburn at times, though unsure how often it occurs, she is not taking anything otc when this occurs. Has occasional dysphagia but this is a chronic issue per patient. She is down about 10 pounds since October though notes she dropped down to 115 lbs but is up to 122 lbs today. She is taking omeprazole 40mg  daily, and famotidine 40mg  at bedtime. She is not taking any NSAIDs.   She continues to have mix of constipation and diarrhea, taking dicyclomine 10mg  TID, she notes she has 2-3 BMs per day typically. She notes she is not really taking iron pills anymore as after she takes them for a few days they tend to give her diarrhea.    Last Endoscopy:08/02/21- Normal hypopharynx. - Web in the proximal esophagus. - Z-line irregular, 34 cm from the incisors. - 2 cm hiatal hernia. - Multiple gastric polyps. - Erythematous mucosa in the prepyloric region of the stomach. 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 - Normal second portion of the duodenum. - No specimens collected. Last Colonoscopy:07/24/19- Decreased sphincter tone found on digital rectal exam. - Diverticulosis in the entire examined colon. - One 10 mm polyp at the hepatic flexure, removed piecemeal using a cold snare. Complete resection. Partial retrieval. Clips (MR conditional) were placed. Tubular adenoma - One small polyp at the splenic flexure, removed with a cold snare.  Resected and retrieved. -benign colonic mucosa - External hemorrhoids. esophagram April 2023: Protrusion of cricopharyngeus muscle noted with swallowing. Implanted device in place to upper right chest.   Recommendations:  Repeat TCS 5 years   Past Medical History:  Diagnosis Date   Anemia    Arthritis    COPD (chronic obstructive pulmonary disease) (HCC)    COVID-19 11/11/2020   Diabetes (HCC)    Diverticulitis    Diverticulosis    Dyspnea    Fibromyalgia     High cholesterol    History of kidney stones    Hypertension    Hypothyroid    IBS (irritable bowel syndrome)    Osteopenia    Pneumonia    Restless leg    Sleep apnea    Doesn't use CPAP.     Tachycardia    per pt/fim   Tremor, essential 01/07/2018    Past Surgical History:  Procedure Laterality Date   ABDOMINAL HYSTERECTOMY     APPENDECTOMY     BACK SURGERY     BIOPSY  12/25/2016   Procedure: BIOPSY;  Surgeon: Malissa Hippo, MD;  Location: [facility];  Service: Endoscopy;;  gastric    BREAST LUMPECTOMY Right 03/20/1999   CATARACT EXTRACTION Bilateral 03/19/2014   CHOLECYSTECTOMY     COLONOSCOPY N/A 12/17/2012   Procedure: COLONOSCOPY;  Surgeon: Malissa Hippo, MD;  Location: [facility];  Service: Endoscopy;  Laterality: N/A;  215   COLONOSCOPY WITH PROPOFOL N/A 07/24/2019   Procedure: COLONOSCOPY WITH PROPOFOL;  Surgeon: Malissa Hippo, MD;  Location: [facility];  Service: Endoscopy;  Laterality: N/A;  730   complete hysterectomy     DRUG INDUCED ENDOSCOPY N/A 09/02/2020   Procedure: DRUG INDUCED ENDOSCOPY;  Surgeon: Osborn Coho, MD;  Location: [facility];  Service: ENT;  Laterality: N/A;   ESOPHAGEAL DILATION N/A 12/25/2016   Procedure: ESOPHAGEAL DILATION;  Surgeon: Malissa Hippo, MD;  [facility]: [facility];  Service: Endoscopy;  Laterality: N/A;   ESOPHAGEAL DILATION N/A 07/24/2019   Procedure: ESOPHAGEAL DILATION;  Surgeon: Malissa Hippo, MD;  [facility]: [facility];  Service: Endoscopy;  Laterality: N/A;   ESOPHAGEAL DILATION N/A 08/02/2021   Procedure: ESOPHAGEAL DILATION;  Surgeon: Malissa Hippo, MD;  [facility]: [facility];  Service: Endoscopy;  Laterality: N/A;   ESOPHAGOGASTRODUODENOSCOPY N/A 12/25/2016   Procedure: ESOPHAGOGASTRODUODENOSCOPY (EGD);  Surgeon: Malissa Hippo, MD;  [facility]: [facility];  Service: Endoscopy;  Laterality: N/A;  730   ESOPHAGOGASTRODUODENOSCOPY (EGD) WITH PROPOFOL N/A 07/24/2019    Procedure: ESOPHAGOGASTRODUODENOSCOPY (EGD) WITH PROPOFOL;  Surgeon: Malissa Hippo, MD;  [facility]: [facility];  Service: Endoscopy;  Laterality: N/A;   ESOPHAGOGASTRODUODENOSCOPY (EGD) WITH PROPOFOL N/A 08/02/2021   Procedure: ESOPHAGOGASTRODUODENOSCOPY (EGD) WITH PROPOFOL;  Surgeon: Malissa Hippo, MD;  Location: [facility];  Service: Endoscopy;  Laterality: N/A;  1245 ASA 2   EXTRACORPOREAL SHOCK WAVE LITHOTRIPSY Left 02/21/2021   Procedure: EXTRACORPOREAL SHOCK WAVE LITHOTRIPSY (ESWL);  Surgeon: Milderd Meager., MD;  Location: [facility];  Service: Urology;  Laterality: Left;   Heel tumor removed     IMPLANTATION OF HYPOGLOSSAL NERVE STIMULATOR Right 11/25/2020   Procedure: IMPLANTATION OF HYPOGLOSSAL NERVE STIMULATOR;  Surgeon: Osborn Coho, MD;  Location: [facility];  Service: ENT;  Laterality: Right;   POLYPECTOMY  07/24/2019   Procedure: POLYPECTOMY;  Surgeon: Malissa Hippo, MD;  [facility]: [facility];  Service: Endoscopy;;   rt elbow surgery  THUMB FUSION Bilateral    TONSILLECTOMY      Current Outpatient Medications  Medication Sig Dispense Refill   acetaminophen (TYLENOL) 500 MG tablet Take 1,000 mg by mouth every 6 (six) hours as needed for moderate pain.     albuterol (PROVENTIL HFA;VENTOLIN HFA) 108 (90 Base) MCG/ACT inhaler Inhale 2 puffs into the lungs every 6 (six) hours as needed for wheezing or shortness of breath.     aspirin EC 81 MG tablet Take 81 mg by mouth. Swallow whole. Takes as needed     Cholecalciferol (VITAMIN D-3) 25 MCG (1000 UT) CAPS Take 1,000 Units by mouth in the morning.     Cyanocobalamin (B-12) 500 MCG TABS Take 500 mcg by mouth in the morning.     dicyclomine (BENTYL) 10 MG capsule TAKE 1 CAPSULE BY MOUTH THREE TIMES DAILY BEFORE MEAL(S) 180 capsule 0   docusate sodium (COLACE) 100 MG capsule Take 2 capsules (200 mg total) by mouth at bedtime. (Patient taking differently: Take 200 mg by mouth at bedtime as needed for moderate  constipation.) 10 capsule 0   DULoxetine (CYMBALTA) 60 MG capsule Take 60 mg by mouth in the morning.     famotidine (PEPCID) 40 MG tablet Take 1 tablet (40 mg total) by mouth at bedtime. 90 tablet 3   ferrous sulfate 325 (65 FE) MG EC tablet Take 325 mg by mouth daily.     Fluticasone-Salmeterol (ADVAIR) 250-50 MCG/DOSE AEPB Inhale 1 puff into the lungs every 12 (twelve) hours.     levothyroxine (SYNTHROID, LEVOTHROID) 75 MCG tablet Take 37.5 mcg by mouth daily before breakfast.     lisinopril (PRINIVIL,ZESTRIL) 10 MG tablet Take 10 mg by mouth in the morning.     loperamide (IMODIUM A-D) 2 MG tablet Take 2 mg by mouth 4 (four) times daily as needed for diarrhea or loose stools.     metFORMIN (GLUCOPHAGE-XR) 500 MG 24 hr tablet Take 500 mg by mouth daily before supper.     mupirocin ointment (BACTROBAN) 2 % Apply 1 application. topically 2 (two) times daily as needed (skin breakdown).     neomycin-bacitracin-polymyxin (NEOSPORIN) ointment Apply 1 application topically as needed for wound care.     simvastatin (ZOCOR) 20 MG tablet Take 20 mg by mouth every evening.     traZODone (DESYREL) 100 MG tablet Take 50-100 mg by mouth at bedtime.     Vitamin E 670 MG (1000 UT) CAPS Take 1,000 Units by mouth in the morning.     METAMUCIL FIBER PO Take 1-3 each by mouth daily. Gummies (Patient not taking: Reported on 10/08/2022)     omeprazole (PRILOSEC) 40 MG capsule Take 1 capsule (40 mg total) by mouth daily. (Patient not taking: Reported on 10/08/2022) 90 capsule 3   ondansetron (ZOFRAN) 4 MG tablet TAKE 1 TABLET BY MOUTH TWICE DAILY AS NEEDED FOR NAUSEA OR VOMITING (Patient not taking: Reported on 06/14/2022) 30 tablet 0   No current facility-administered medications for this visit.    Allergies as of 10/08/2022 - Review Complete 10/08/2022  Allergen Reaction Noted   Flagyl [metronidazole] Shortness Of Breath and Swelling 10/07/2015   Lyrica [pregabalin] Shortness Of Breath 01/04/2015   Adhesive  [tape] Other (See Comments) 12/02/2012   Aspirin Other (See Comments) 11/24/2012   Latex Itching and Rash 11/24/2012   Phenergan [promethazine hcl] Other (See Comments) 10/07/2015   Prednisone Anxiety 11/24/2012   Sulfa antibiotics Nausea Only 10/07/2015   Tetanus toxoids Swelling 11/24/2012   Wound dressing adhesive  Rash 05/07/2021    Family History  Problem Relation Age of Onset   Uterine cancer Mother    Parkinson's disease Father    Stroke Father    Colon cancer Brother    Lung cancer Brother    COPD Sister    Diabetes Grandchild    Asthma Son     Social History   Socioeconomic History   Marital status: Married    Spouse name: Not on file   Number of children: 2   Years of education: College   Highest education level: Associate degree: academic program  Occupational History   Occupation: Retired  Tobacco Use   Smoking status: Never    Passive exposure: Never   Smokeless tobacco: Never  Vaping Use   Vaping status: Never Used  Substance and Sexual Activity   Alcohol use: No    Alcohol/week: 0.0 standard drinks of alcohol   Drug use: Never   Sexual activity: Not Currently  Other Topics Concern   Not on file  Social History Narrative   Lives at home w/ her husband   Right-handed   Occasional caffeine: decaf tea, diet sodas 2 daily   Social Determinants of Corporate investment banker Strain: Not on file  Food Insecurity: Not on file  Transportation Needs: Not on file  Physical Activity: Not on file  Stress: Not on file  Social Connections: Not on file    Review of systems General: negative for malaise, night sweats, fever, chills, weight loss Neck: Negative for lumps, goiter, pain and significant neck swelling Resp: Negative for cough, wheezing, dyspnea at rest CV: Negative for chest pain, leg swelling, palpitations, orthopnea GI: denies melena, hematochezia,  odynophagia +constipation/diarrhea, +nausea/vomiting  +early satiety +weight loss +dysphagia   MSK: Negative for joint pain or swelling, back pain, and muscle pain. Derm: Negative for itching or rash Psych: Denies depression, anxiety, memory loss, confusion. No homicidal or suicidal ideation.  Heme: Negative for prolonged bleeding, bruising easily, and swollen nodes. Endocrine: Negative for cold or heat intolerance, polyuria, polydipsia and goiter. Neuro: negative for tremor, gait imbalance, syncope and seizures. The remainder of the review of systems is noncontributory.  Physical Exam: BP (!) 157/65 (BP Location: Right Arm, Patient Position: Sitting, Cuff Size: Normal)   Pulse (!) 56   Temp 98 F (36.7 C) (Oral)   Ht 5' 1.5" (1.562 m)   Wt 122 lb 9.6 oz (55.6 kg)   BMI 22.79 kg/m  General:   Alert and oriented. No distress noted. Pleasant and cooperative.  Head:  Normocephalic and atraumatic. Eyes:  Conjuctiva clear without scleral icterus. Mouth:  Oral mucosa pink and moist. Good dentition. No lesions. Heart: Normal rate and rhythm, s1 and s2 heart sounds present.  Lungs: Clear lung sounds in all lobes. Respirations equal and unlabored. Abdomen:  +BS, soft, and non-distended.  TTP of epigastric region. No rebound or guarding. No HSM or masses noted. Derm: No palmar erythema or jaundice Msk:  Symmetrical without gross deformities. Normal posture. Extremities:  Without edema. Neurologic:  Alert and  oriented x4 Psych:  Alert and cooperative. Normal mood and affect.  Invalid input(s): "6 MONTHS"   ASSESSMENT: Heather Espinoza is a 77 y.o. female presenting today for follow up of IBS, GERD, IDA, nausea/vomiting  IBS-M: has mix of both diarrhea and constipation, she takes dicyclomine 10mg  TID for abdominal cramping which helps some. Having a BM 2-3x/day. Will continue with dicyclomine 10mg  TID PRN.  Nausea/vomiting: ongoing, worse than at last visit.  having nausea daily with epigastric pain, early satiety. She has lost approximately 10 pounds since the end of last year though  notes she did drop down to 115 lbs a while back and is up to 122 lbs today. I had recommended EGD previously which due to health issues with her husband, she has not yet scheduled, she is amenable to proceeding with getting this scheduled today. Will send zofran 4mg  for her to take Q8H prn.   GERD:well controlled on omeprazole 40mg  daily and famotidine 40mg  at bedtime. She has some dysphagia at times. History of proximal esophageal web, dysphagia can be evaluated further via EGD, as above.   IDA: last labs with low normal iron studies, hgb 10.6. no rectal bleeding or melena. Has not been taking ferrous sulfate recently due to it causing her to have diarrhea after taking for a few days in a row. Ideally she should remain on supplemental iron therapy, will stop ferrous sulfate and try Niferex 150mg  daily in hopes this will cause less GI upset for her   PLAN:  Schedule EGD-ASA III  2. Stop ferrous sulfate, start niferex 150mg  daily  3. Continue omeprazole 40mg  daily, famotidine 40mg  at bedtime 4. Continue bentyl 10mg  up to TID PRN 5. Zofran 4mg  Q8H PRN for nausea/vomiting  All questions were answered, patient verbalized understanding and is in agreement with plan as outlined above.   Follow Up: 4 months   Shondale Quinley L. Jeanmarie Hubert, MSN, APRN, AGNP-C Adult-Gerontology Nurse Practitioner [facility]  I have reviewed the note and agree with the APP's assessment as described in this progress note  Katrinka Blazing, MD Gastroenterology and Hepatology [facility]

## 2022-10-08 NOTE — H&P (View-Only) (Signed)
 Referring Provider: Farris Has, MD Primary Care Physician:  Farris Has, MD Primary GI Physician: Dr. Levon Hedger   Chief Complaint  Patient presents with   Emesis    Follow up on nausea and vomiting. About the same as last visit. Would like rx for nausea med.    Irritable Bowel Syndrome    Follow up on IBS. Symptoms comes and goes. Also about the same as last visit.    Weight Loss    Follow up on weight loss. States lowest weight was 115 lb. Today 122 lbs. Doing some better with appetite. Cooking at home more now than before.    Anemia    Follow up on IDA. Not taking iron tablet regularly. Takes about twice a week due to it causing diarrhea.    HPI:   Heather Espinoza is a 76 y.o. female with past medical history of anemia, COPD, DM, diverticulitis, Fibromyalgia, high cholesterol, HTN, IBS, osteopenia, RLS.   Presenting today for follow up of nausea/vomiting, GERD, IBS, IDA   Last seen February 2024, at that time, having intermittent nausea and vomiting with history of the same, some early satiety, down 10 pounds since October. Some lower abdominal cramping, stools ranging from constipation to diarrhea.  Taking dicyclomine 10 mg 3 times daily but unsure if it helped much with her symptoms.  Taking iron pills usually taking 3-4 times a week  Recommended checking h&h, Iron studies, restart omeprazole 40mg  daily, famotidine 40mg  at bedtime, Schedule EGD/colonoscopy if iron low, continue bentyl 10mg  TID, daily probiotic, continue oral iron, low FODMAP.   Hgb 10.7, iron studies with iron 73, TIBC 340, Sat 21%, ferritin 36, advised to continue with PO iron  EGD was not scheduled   Present:  Patient states he her husband has had some significant health issues she has been caring for him which kept her from scheduling EGD.  She notes nausea and vomiting are worse, she is nauseated almost daily. She vomited yesterday but typically only has nausea. She has some epigastric pain that is  worsened by eating. She notes some early satiety at times as well. Denies rectal bleeding or melena. She endorses heartburn at times, though unsure how often it occurs, she is not taking anything otc when this occurs. Has occasional dysphagia but this is a chronic issue per patient. She is down about 10 pounds since October though notes she dropped down to 115 lbs but is up to 122 lbs today. She is taking omeprazole 40mg  daily, and famotidine 40mg  at bedtime. She is not taking any NSAIDs.   She continues to have mix of constipation and diarrhea, taking dicyclomine 10mg  TID, she notes she has 2-3 BMs per day typically. She notes she is not really taking iron pills anymore as after she takes them for a few days they tend to give her diarrhea.    Last Endoscopy:08/02/21- Normal hypopharynx. - Web in the proximal esophagus. - Z-line irregular, 34 cm from the incisors. - 2 cm hiatal hernia. - Multiple gastric polyps. - Erythematous mucosa in the prepyloric region of the stomach. 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 - Normal second portion of the duodenum. - No specimens collected. Last Colonoscopy:07/24/19- Decreased sphincter tone found on digital rectal exam. - Diverticulosis in the entire examined colon. - One 10 mm polyp at the hepatic flexure, removed piecemeal using a cold snare. Complete resection. Partial retrieval. Clips (MR conditional) were placed. Tubular adenoma - One small polyp at the splenic flexure, removed with a cold snare.  Resected and retrieved. -benign colonic mucosa - External hemorrhoids. esophagram April 2023: Protrusion of cricopharyngeus muscle noted with swallowing. Implanted device in place to upper right chest.   Recommendations:  Repeat TCS 5 years   Past Medical History:  Diagnosis Date   Anemia    Arthritis    COPD (chronic obstructive pulmonary disease) (HCC)    COVID-19 11/11/2020   Diabetes (HCC)    Diverticulitis    Diverticulosis    Dyspnea    Fibromyalgia     High cholesterol    History of kidney stones    Hypertension    Hypothyroid    IBS (irritable bowel syndrome)    Osteopenia    Pneumonia    Restless leg    Sleep apnea    Doesn't use CPAP.     Tachycardia    per pt/fim   Tremor, essential 01/07/2018    Past Surgical History:  Procedure Laterality Date   ABDOMINAL HYSTERECTOMY     APPENDECTOMY     BACK SURGERY     BIOPSY  12/25/2016   Procedure: BIOPSY;  Surgeon: Malissa Hippo, MD;  Location: [facility];  Service: Endoscopy;;  gastric    BREAST LUMPECTOMY Right 03/20/1999   CATARACT EXTRACTION Bilateral 03/19/2014   CHOLECYSTECTOMY     COLONOSCOPY N/A 12/17/2012   Procedure: COLONOSCOPY;  Surgeon: Malissa Hippo, MD;  Location: [facility];  Service: Endoscopy;  Laterality: N/A;  215   COLONOSCOPY WITH PROPOFOL N/A 07/24/2019   Procedure: COLONOSCOPY WITH PROPOFOL;  Surgeon: Malissa Hippo, MD;  [facility]: [facility];  Service: Endoscopy;  Laterality: N/A;  730   complete hysterectomy     DRUG INDUCED ENDOSCOPY N/A 09/02/2020   Procedure: DRUG INDUCED ENDOSCOPY;  Surgeon: Osborn Coho, MD;  Location: [facility];  Service: ENT;  Laterality: N/A;   ESOPHAGEAL DILATION N/A 12/25/2016   Procedure: ESOPHAGEAL DILATION;  Surgeon: Malissa Hippo, MD;  [facility];  Service: Endoscopy;  Laterality: N/A;   ESOPHAGEAL DILATION N/A 07/24/2019   Procedure: ESOPHAGEAL DILATION;  Surgeon: Malissa Hippo, MD;  [facility]: [facility];  Service: Endoscopy;  Laterality: N/A;   ESOPHAGEAL DILATION N/A 08/02/2021   Procedure: ESOPHAGEAL DILATION;  Surgeon: Malissa Hippo, MD;  [facility];  Service: Endoscopy;  Laterality: N/A;   ESOPHAGOGASTRODUODENOSCOPY N/A 12/25/2016   Procedure: ESOPHAGOGASTRODUODENOSCOPY (EGD);  Surgeon: Malissa Hippo, MD;  [facility];  Service: Endoscopy;  Laterality: N/A;  730   ESOPHAGOGASTRODUODENOSCOPY (EGD) WITH PROPOFOL N/A 07/24/2019    Procedure: ESOPHAGOGASTRODUODENOSCOPY (EGD) WITH PROPOFOL;  Surgeon: Malissa Hippo, MD;  [facility];  Service: Endoscopy;  Laterality: N/A;   ESOPHAGOGASTRODUODENOSCOPY (EGD) WITH PROPOFOL N/A 08/02/2021   Procedure: ESOPHAGOGASTRODUODENOSCOPY (EGD) WITH PROPOFOL;  Surgeon: Malissa Hippo, MD;  [facility];  Service: Endoscopy;  Laterality: N/A;  1245 ASA 2   EXTRACORPOREAL SHOCK WAVE LITHOTRIPSY Left 02/21/2021   Procedure: EXTRACORPOREAL SHOCK WAVE LITHOTRIPSY (ESWL);  Surgeon: Milderd Meager., MD;  Location: [facility];  Service: Urology;  Laterality: Left;   Heel tumor removed     IMPLANTATION OF HYPOGLOSSAL NERVE STIMULATOR Right 11/25/2020   Procedure: IMPLANTATION OF HYPOGLOSSAL NERVE STIMULATOR;  Surgeon: Osborn Coho, MD;  Location: [facility];  Service: ENT;  Laterality: Right;   POLYPECTOMY  07/24/2019   Procedure: POLYPECTOMY;  Surgeon: Malissa Hippo, MD;  [facility];  Service: Endoscopy;;   rt elbow surgery  THUMB FUSION Bilateral    TONSILLECTOMY      Current Outpatient Medications  Medication Sig Dispense Refill   acetaminophen (TYLENOL) 500 MG tablet Take 1,000 mg by mouth every 6 (six) hours as needed for moderate pain.     albuterol (PROVENTIL HFA;VENTOLIN HFA) 108 (90 Base) MCG/ACT inhaler Inhale 2 puffs into the lungs every 6 (six) hours as needed for wheezing or shortness of breath.     aspirin EC 81 MG tablet Take 81 mg by mouth. Swallow whole. Takes as needed     Cholecalciferol (VITAMIN D-3) 25 MCG (1000 UT) CAPS Take 1,000 Units by mouth in the morning.     Cyanocobalamin (B-12) 500 MCG TABS Take 500 mcg by mouth in the morning.     dicyclomine (BENTYL) 10 MG capsule TAKE 1 CAPSULE BY MOUTH THREE TIMES DAILY BEFORE MEAL(S) 180 capsule 0   docusate sodium (COLACE) 100 MG capsule Take 2 capsules (200 mg total) by mouth at bedtime. (Patient taking differently: Take 200 mg by mouth at bedtime as needed for moderate  constipation.) 10 capsule 0   DULoxetine (CYMBALTA) 60 MG capsule Take 60 mg by mouth in the morning.     famotidine (PEPCID) 40 MG tablet Take 1 tablet (40 mg total) by mouth at bedtime. 90 tablet 3   ferrous sulfate 325 (65 FE) MG EC tablet Take 325 mg by mouth daily.     Fluticasone-Salmeterol (ADVAIR) 250-50 MCG/DOSE AEPB Inhale 1 puff into the lungs every 12 (twelve) hours.     levothyroxine (SYNTHROID, LEVOTHROID) 75 MCG tablet Take 37.5 mcg by mouth daily before breakfast.     lisinopril (PRINIVIL,ZESTRIL) 10 MG tablet Take 10 mg by mouth in the morning.     loperamide (IMODIUM A-D) 2 MG tablet Take 2 mg by mouth 4 (four) times daily as needed for diarrhea or loose stools.     metFORMIN (GLUCOPHAGE-XR) 500 MG 24 hr tablet Take 500 mg by mouth daily before supper.     mupirocin ointment (BACTROBAN) 2 % Apply 1 application. topically 2 (two) times daily as needed (skin breakdown).     neomycin-bacitracin-polymyxin (NEOSPORIN) ointment Apply 1 application topically as needed for wound care.     simvastatin (ZOCOR) 20 MG tablet Take 20 mg by mouth every evening.     traZODone (DESYREL) 100 MG tablet Take 50-100 mg by mouth at bedtime.     Vitamin E 670 MG (1000 UT) CAPS Take 1,000 Units by mouth in the morning.     METAMUCIL FIBER PO Take 1-3 each by mouth daily. Gummies (Patient not taking: Reported on 10/08/2022)     omeprazole (PRILOSEC) 40 MG capsule Take 1 capsule (40 mg total) by mouth daily. (Patient not taking: Reported on 10/08/2022) 90 capsule 3   ondansetron (ZOFRAN) 4 MG tablet TAKE 1 TABLET BY MOUTH TWICE DAILY AS NEEDED FOR NAUSEA OR VOMITING (Patient not taking: Reported on 06/14/2022) 30 tablet 0   No current facility-administered medications for this visit.    Allergies as of 10/08/2022 - Review Complete 10/08/2022  Allergen Reaction Noted   Flagyl [metronidazole] Shortness Of Breath and Swelling 10/07/2015   Lyrica [pregabalin] Shortness Of Breath 01/04/2015   Adhesive  [tape] Other (See Comments) 12/02/2012   Aspirin Other (See Comments) 11/24/2012   Latex Itching and Rash 11/24/2012   Phenergan [promethazine hcl] Other (See Comments) 10/07/2015   Prednisone Anxiety 11/24/2012   Sulfa antibiotics Nausea Only 10/07/2015   Tetanus toxoids Swelling 11/24/2012   Wound dressing adhesive  Rash 05/07/2021    Family History  Problem Relation Age of Onset   Uterine cancer Mother    Parkinson's disease Father    Stroke Father    Colon cancer Brother    Lung cancer Brother    COPD Sister    Diabetes Grandchild    Asthma Son     Social History   Socioeconomic History   Marital status: Married    Spouse name: Not on file   Number of children: 2   Years of education: College   Highest education level: Associate degree: academic program  Occupational History   Occupation: Retired  Tobacco Use   Smoking status: Never    Passive exposure: Never   Smokeless tobacco: Never  Vaping Use   Vaping status: Never Used  Substance and Sexual Activity   Alcohol use: No    Alcohol/week: 0.0 standard drinks of alcohol   Drug use: Never   Sexual activity: Not Currently  Other Topics Concern   Not on file  Social History Narrative   Lives at home w/ her husband   Right-handed   Occasional caffeine: decaf tea, diet sodas 2 daily   Social Determinants of Corporate investment banker Strain: Not on file  Food Insecurity: Not on file  Transportation Needs: Not on file  Physical Activity: Not on file  Stress: Not on file  Social Connections: Not on file    Review of systems General: negative for malaise, night sweats, fever, chills, weight loss Neck: Negative for lumps, goiter, pain and significant neck swelling Resp: Negative for cough, wheezing, dyspnea at rest CV: Negative for chest pain, leg swelling, palpitations, orthopnea GI: denies melena, hematochezia,  odynophagia +constipation/diarrhea, +nausea/vomiting  +early satiety +weight loss +dysphagia   MSK: Negative for joint pain or swelling, back pain, and muscle pain. Derm: Negative for itching or rash Psych: Denies depression, anxiety, memory loss, confusion. No homicidal or suicidal ideation.  Heme: Negative for prolonged bleeding, bruising easily, and swollen nodes. Endocrine: Negative for cold or heat intolerance, polyuria, polydipsia and goiter. Neuro: negative for tremor, gait imbalance, syncope and seizures. The remainder of the review of systems is noncontributory.  Physical Exam: BP (!) 157/65 (BP Location: Right Arm, Patient Position: Sitting, Cuff Size: Normal)   Pulse (!) 56   Temp 98 F (36.7 C) (Oral)   Ht 5' 1.5" (1.562 m)   Wt 122 lb 9.6 oz (55.6 kg)   BMI 22.79 kg/m  General:   Alert and oriented. No distress noted. Pleasant and cooperative.  Head:  Normocephalic and atraumatic. Eyes:  Conjuctiva clear without scleral icterus. Mouth:  Oral mucosa pink and moist. Good dentition. No lesions. Heart: Normal rate and rhythm, s1 and s2 heart sounds present.  Lungs: Clear lung sounds in all lobes. Respirations equal and unlabored. Abdomen:  +BS, soft, and non-distended.  TTP of epigastric region. No rebound or guarding. No HSM or masses noted. Derm: No palmar erythema or jaundice Msk:  Symmetrical without gross deformities. Normal posture. Extremities:  Without edema. Neurologic:  Alert and  oriented x4 Psych:  Alert and cooperative. Normal mood and affect.  Invalid input(s): "6 MONTHS"   ASSESSMENT: Heather Espinoza is a 77 y.o. female presenting today for follow up of IBS, GERD, IDA, nausea/vomiting  IBS-M: has mix of both diarrhea and constipation, she takes dicyclomine 10mg  TID for abdominal cramping which helps some. Having a BM 2-3x/day. Will continue with dicyclomine 10mg  TID PRN.  Nausea/vomiting: ongoing, worse than at last visit.  having nausea daily with epigastric pain, early satiety. She has lost approximately 10 pounds since the end of last year though  notes she did drop down to 115 lbs a while back and is up to 122 lbs today. I had recommended EGD previously which due to health issues with her husband, she has not yet scheduled, she is amenable to proceeding with getting this scheduled today. Will send zofran 4mg  for her to take Q8H prn.   GERD:well controlled on omeprazole 40mg  daily and famotidine 40mg  at bedtime. She has some dysphagia at times. History of proximal esophageal web, dysphagia can be evaluated further via EGD, as above.   IDA: last labs with low normal iron studies, hgb 10.6. no rectal bleeding or melena. Has not been taking ferrous sulfate recently due to it causing her to have diarrhea after taking for a few days in a row. Ideally she should remain on supplemental iron therapy, will stop ferrous sulfate and try Niferex 150mg  daily in hopes this will cause less GI upset for her   PLAN:  Schedule EGD-ASA III  2. Stop ferrous sulfate, start niferex 150mg  daily  3. Continue omeprazole 40mg  daily, famotidine 40mg  at bedtime 4. Continue bentyl 10mg  up to TID PRN 5. Zofran 4mg  Q8H PRN for nausea/vomiting  All questions were answered, patient verbalized understanding and is in agreement with plan as outlined above.   Follow Up: 4 months   Chelsea L. Jeanmarie Hubert, MSN, APRN, AGNP-C Adult-Gerontology Nurse Practitioner [facility]  I have reviewed the note and agree with the APP's assessment as described in this progress note  Katrinka Blazing, MD Gastroenterology and Hepatology [facility]

## 2022-10-12 ENCOUNTER — Encounter (INDEPENDENT_AMBULATORY_CARE_PROVIDER_SITE_OTHER): Payer: Self-pay

## 2022-10-19 NOTE — Patient Instructions (Signed)
Heather Espinoza  10/19/2022     @PREFPERIOPPHARMACY @   Your procedure is scheduled on  10/26/2022.   Report to [facility] at  0900  A.M.   Call this number if you have problems the morning of surgery:  [phone]  If you experience any cold or flu symptoms such as cough, fever, chills, shortness of breath, etc. between now and your scheduled surgery, please notify us at the above number.   Remember:  Follow the diet instructions given to you by the office.     Take these medicines the morning of surgery with A SIP OF WATER                       cymbalta, omeprazole, zofran (if needed).     Do not wear jewelry, make-up or nail polish, including gel polish,  artificial nails, or any other type of covering on natural nails (fingers and  toes).  Do not wear lotions, powders, or perfumes, or deodorant.  Do not shave 48 hours prior to surgery.  Men may shave face and neck.  Do not bring valuables to the hospital.  [facility] is not responsible for any belongings or valuables.  Contacts, dentures or bridgework may not be worn into surgery.  Leave your suitcase in the car.  After surgery it may be brought to your room.  For patients admitted to the hospital, discharge time will be determined by your treatment team.  Patients discharged the day of surgery will not be allowed to drive home and must have someone with them for 24 hours.    Special instructions:   DO NOT smoke tobacco or vape for 24 hours before your procedure.  Please read over the following fact sheets that you were given. Anesthesia Post-op Instructions and Care and Recovery After Surgery      Upper Endoscopy, Adult, Care After After the procedure, it is common to have a sore throat. It is also common to have: Mild stomach pain or discomfort. Bloating. Nausea. Follow these instructions at home: The instructions below may help you care for yourself at home. Your health care provider may give you  more instructions. If you have questions, ask your health care provider. If you were given a sedative during the procedure, it can affect you for several hours. Do not drive or operate machinery until your health care provider says that it is safe. If you will be going home right after the procedure, plan to have a responsible adult: Take you home from the hospital or clinic. You will not be allowed to drive. Care for you for the time you are told. Follow instructions from your health care provider about what you may eat and drink. Return to your normal activities as told by your health care provider. Ask your health care provider what activities are safe for you. Take over-the-counter and prescription medicines only as told by your health care provider. Contact a health care provider if you: Have a sore throat that lasts longer than one day. Have trouble swallowing. Have a fever. Get help right away if you: Vomit blood or your vomit looks like coffee grounds. Have bloody, black, or tarry stools. Have a very bad sore throat or you cannot swallow. Have difficulty breathing or very bad pain in your chest or abdomen. These symptoms may be an emergency. Get help right away. Call 911. Do not wait to see if the symptoms will  go away. Do not drive yourself to the hospital. Summary After the procedure, it is common to have a sore throat, mild stomach discomfort, bloating, and nausea. If you were given a sedative during the procedure, it can affect you for several hours. Do not drive until your health care provider says that it is safe. Follow instructions from your health care provider about what you may eat and drink. Return to your normal activities as told by your health care provider. This information is not intended to replace advice given to you by your health care provider. Make sure you discuss any questions you have with your health care provider. Document Revised: 06/14/2021 Document  Reviewed: 06/14/2021 Elsevier Patient Education  2024 [employer] Monitored Anesthesia Care, Care After The following information offers guidance on how to care for yourself after your procedure. Your health care provider may also give you more specific instructions. If you have problems or questions, contact your health care provider. What can I expect after the procedure? After the procedure, it is common to have: Tiredness. Little or no memory about what happened during or after the procedure. Impaired judgment when it comes to making decisions. Nausea or vomiting. Some trouble with balance. Follow these instructions at home: For the time period you were told by your health care provider:  Rest. Do not participate in activities where you could fall or become injured. Do not drive or use machinery. Do not drink alcohol. Do not take sleeping pills or medicines that cause drowsiness. Do not make important decisions or sign legal documents. Do not take care of children on your own. Medicines Take over-the-counter and prescription medicines only as told by your health care provider. If you were prescribed antibiotics, take them as told by your health care provider. Do not stop using the antibiotic even if you start to feel better. Eating and drinking Follow instructions from your health care provider about what you may eat and drink. Drink enough fluid to keep your urine pale yellow. If you vomit: Drink clear fluids slowly and in small amounts as you are able. Clear fluids include water, ice chips, low-calorie sports drinks, and fruit juice that has water added to it (diluted fruit juice). Eat light and bland foods in small amounts as you are able. These foods include bananas, applesauce, rice, lean meats, toast, and crackers. General instructions  Have a responsible adult stay with you for the time you are told. It is important to have someone help care for you until you are awake  and alert. If you have sleep apnea, surgery and some medicines can increase your risk for breathing problems. Follow instructions from your health care provider about wearing your sleep device: When you are sleeping. This includes during daytime naps. While taking prescription pain medicines, sleeping medicines, or medicines that make you drowsy. Do not use any products that contain nicotine or tobacco. These products include cigarettes, chewing tobacco, and vaping devices, such as e-cigarettes. If you need help quitting, ask your health care provider. Contact a health care provider if: You feel nauseous or vomit every time you eat or drink. You feel light-headed. You are still sleepy or having trouble with balance after 24 hours. You get a rash. You have a fever. You have redness or swelling around the IV site. Get help right away if: You have trouble breathing. You have new confusion after you get home. These symptoms may be an emergency. Get help right away. Call 911. Do not wait  to see if the symptoms will go away. Do not drive yourself to the hospital. This information is not intended to replace advice given to you by your health care provider. Make sure you discuss any questions you have with your health care provider. Document Revised: 07/31/2021 Document Reviewed: 07/31/2021 Elsevier Patient Education  2024 [employer].

## 2022-10-22 ENCOUNTER — Encounter (HOSPITAL_COMMUNITY)
Admission: RE | Admit: 2022-10-22 | Discharge: 2022-10-22 | Disposition: A | Discharge status: Home / Self Care | Payer: Medicare Other | Source: Ambulatory Visit | Attending: Gastroenterology | Admitting: Gastroenterology

## 2022-10-22 VITALS — BP 166/69 | HR 56 | Temp 98.0°F | Resp 18 | Ht 61.5 in | Wt 122.6 lb

## 2022-10-22 DIAGNOSIS — E119 Type 2 diabetes mellitus without complications: Secondary | ICD-10-CM | POA: Insufficient documentation

## 2022-10-22 DIAGNOSIS — Z01818 Encounter for other preprocedural examination: Secondary | ICD-10-CM | POA: Insufficient documentation

## 2022-10-22 DIAGNOSIS — D509 Iron deficiency anemia, unspecified: Secondary | ICD-10-CM | POA: Insufficient documentation

## 2022-10-22 DIAGNOSIS — I10 Essential (primary) hypertension: Secondary | ICD-10-CM

## 2022-10-22 DIAGNOSIS — N183 Chronic kidney disease, stage 3 unspecified: Secondary | ICD-10-CM

## 2022-10-25 ENCOUNTER — Other Ambulatory Visit (INDEPENDENT_AMBULATORY_CARE_PROVIDER_SITE_OTHER): Payer: Self-pay | Admitting: Gastroenterology

## 2022-10-25 DIAGNOSIS — K582 Mixed irritable bowel syndrome: Secondary | ICD-10-CM

## 2022-10-25 NOTE — Telephone Encounter (Signed)
Last seen:  10/08/22

## 2022-10-26 ENCOUNTER — Ambulatory Visit (HOSPITAL_BASED_OUTPATIENT_CLINIC_OR_DEPARTMENT_OTHER): Payer: Medicare Other | Admitting: Anesthesiology

## 2022-10-26 ENCOUNTER — Encounter (HOSPITAL_COMMUNITY)
Admission: RE | Disposition: A | Discharge status: Home / Self Care | Payer: Self-pay | Source: Home / Self Care | Attending: Gastroenterology

## 2022-10-26 ENCOUNTER — Encounter (HOSPITAL_COMMUNITY): Payer: Self-pay | Admitting: Gastroenterology

## 2022-10-26 ENCOUNTER — Ambulatory Visit (HOSPITAL_COMMUNITY)
Admission: RE | Admit: 2022-10-26 | Discharge: 2022-10-26 | Disposition: A | Discharge status: Home / Self Care | Payer: Medicare Other | Attending: Gastroenterology | Admitting: Gastroenterology

## 2022-10-26 ENCOUNTER — Ambulatory Visit (HOSPITAL_COMMUNITY): Payer: Medicare Other | Admitting: Anesthesiology

## 2022-10-26 DIAGNOSIS — E119 Type 2 diabetes mellitus without complications: Secondary | ICD-10-CM | POA: Insufficient documentation

## 2022-10-26 DIAGNOSIS — M797 Fibromyalgia: Secondary | ICD-10-CM | POA: Diagnosis not present

## 2022-10-26 DIAGNOSIS — E7800 Pure hypercholesterolemia, unspecified: Secondary | ICD-10-CM | POA: Insufficient documentation

## 2022-10-26 DIAGNOSIS — R1310 Dysphagia, unspecified: Secondary | ICD-10-CM

## 2022-10-26 DIAGNOSIS — K3189 Other diseases of stomach and duodenum: Secondary | ICD-10-CM | POA: Insufficient documentation

## 2022-10-26 DIAGNOSIS — N183 Chronic kidney disease, stage 3 unspecified: Secondary | ICD-10-CM | POA: Diagnosis not present

## 2022-10-26 DIAGNOSIS — Z7984 Long term (current) use of oral hypoglycemic drugs: Secondary | ICD-10-CM | POA: Insufficient documentation

## 2022-10-26 DIAGNOSIS — K219 Gastro-esophageal reflux disease without esophagitis: Secondary | ICD-10-CM | POA: Insufficient documentation

## 2022-10-26 DIAGNOSIS — K449 Diaphragmatic hernia without obstruction or gangrene: Secondary | ICD-10-CM

## 2022-10-26 DIAGNOSIS — K589 Irritable bowel syndrome without diarrhea: Secondary | ICD-10-CM | POA: Diagnosis not present

## 2022-10-26 DIAGNOSIS — Z79899 Other long term (current) drug therapy: Secondary | ICD-10-CM | POA: Diagnosis not present

## 2022-10-26 DIAGNOSIS — E039 Hypothyroidism, unspecified: Secondary | ICD-10-CM | POA: Insufficient documentation

## 2022-10-26 DIAGNOSIS — M1990 Unspecified osteoarthritis, unspecified site: Secondary | ICD-10-CM | POA: Insufficient documentation

## 2022-10-26 DIAGNOSIS — G4730 Sleep apnea, unspecified: Secondary | ICD-10-CM | POA: Insufficient documentation

## 2022-10-26 DIAGNOSIS — J449 Chronic obstructive pulmonary disease, unspecified: Secondary | ICD-10-CM | POA: Insufficient documentation

## 2022-10-26 DIAGNOSIS — I129 Hypertensive chronic kidney disease with stage 1 through stage 4 chronic kidney disease, or unspecified chronic kidney disease: Secondary | ICD-10-CM | POA: Diagnosis not present

## 2022-10-26 DIAGNOSIS — I10 Essential (primary) hypertension: Secondary | ICD-10-CM | POA: Diagnosis not present

## 2022-10-26 DIAGNOSIS — R112 Nausea with vomiting, unspecified: Secondary | ICD-10-CM | POA: Diagnosis present

## 2022-10-26 DIAGNOSIS — R1013 Epigastric pain: Secondary | ICD-10-CM

## 2022-10-26 DIAGNOSIS — D132 Benign neoplasm of duodenum: Secondary | ICD-10-CM | POA: Diagnosis not present

## 2022-10-26 HISTORY — PX: BIOPSY: SHX5522

## 2022-10-26 HISTORY — PX: SAVORY DILATION: SHX5439

## 2022-10-26 HISTORY — PX: ESOPHAGOGASTRODUODENOSCOPY (EGD) WITH PROPOFOL: SHX5813

## 2022-10-26 LAB — GLUCOSE, CAPILLARY: Glucose-Capillary: 90 mg/dL (ref 70–99)

## 2022-10-26 SURGERY — ESOPHAGOGASTRODUODENOSCOPY (EGD) WITH PROPOFOL
Anesthesia: General

## 2022-10-26 MED ORDER — LIDOCAINE HCL 1 % IJ SOLN
INTRAMUSCULAR | Status: DC | PRN
  Administered 2022-10-26: 50 mg via INTRADERMAL

## 2022-10-26 MED ORDER — LACTATED RINGERS IV SOLN: INTRAVENOUS | Status: DC | PRN

## 2022-10-26 MED ORDER — PROPOFOL 10 MG/ML IV BOLUS
INTRAVENOUS | Status: DC | PRN
Start: 2022-10-26 — End: 2022-10-26
  Administered 2022-10-26: 50 mg via INTRAVENOUS
  Administered 2022-10-26: 100 mg via INTRAVENOUS
  Administered 2022-10-26: 30 mg via INTRAVENOUS
  Administered 2022-10-26: 20 mg via INTRAVENOUS

## 2022-10-26 NOTE — Anesthesia Preprocedure Evaluation (Signed)
Anesthesia Evaluation  Patient identified by MRN, date of birth, ID band Patient awake    Reviewed: Allergy & Precautions, H&P , NPO status , Patient's Chart, lab work & pertinent test results  Airway Mallampati: II  TM Distance: >3 FB Neck ROM: Full    Dental  (+) Missing, Dental Advisory Given, Chipped   Pulmonary shortness of breath and with exertion, sleep apnea , pneumonia, COPD   Pulmonary exam normal breath sounds clear to auscultation       Cardiovascular hypertension, Pt. on medications Normal cardiovascular exam Rhythm:Regular Rate:Normal     Neuro/Psych negative neurological ROS  negative psych ROS   GI/Hepatic Neg liver ROS,GERD  ,,  Endo/Other  diabetes, Well Controlled, Type 2, Oral Hypoglycemic AgentsHypothyroidism    Renal/GU Renal disease  negative genitourinary   Musculoskeletal  (+) Arthritis , Osteoarthritis,  Fibromyalgia -  Abdominal   Peds negative pediatric ROS (+)  Hematology  (+) Blood dyscrasia, anemia   Anesthesia Other Findings   Reproductive/Obstetrics negative OB ROS                             Anesthesia Physical Anesthesia Plan  ASA: 3  Anesthesia Plan: General   Post-op Pain Management: Minimal or no pain anticipated   Induction: Intravenous  PONV Risk Score and Plan: 0 and Propofol infusion  Airway Management Planned: Nasal Cannula and Natural Airway  Additional Equipment:   Intra-op Plan:   Post-operative Plan:   Informed Consent: I have reviewed the patients History and Physical, chart, labs and discussed the procedure including the risks, benefits and alternatives for the proposed anesthesia with the patient or authorized representative who has indicated his/her understanding and acceptance.     Dental advisory given  Plan Discussed with: CRNA and Surgeon  Anesthesia Plan Comments:        Anesthesia Quick Evaluation

## 2022-10-26 NOTE — Op Note (Signed)
[facility] Patient Name: Heather Espinoza Procedure Date: 10/26/2022 10:29 AM MRN: [id number] Date of Birth: 1945/08/16 Attending MD: Katrinka Blazing , , [id number] CSN: [id number] Age: 77 Admit Type: Outpatient Procedure:                Upper GI endoscopy Indications:              Epigastric abdominal pain, Dysphagia, Nausea Providers:                Katrinka Blazing, Crystal Page, Volanda Napoleon., Technician Referring MD:              Medicines:                Monitored Anesthesia Care Complications:            No immediate complications. Estimated Blood Loss:     Estimated blood loss: none. Procedure:                Pre-Anesthesia Assessment:                           - Prior to the procedure, a History and Physical                            was performed, and patient medications, allergies                            and sensitivities were reviewed. The patient's                            tolerance of previous anesthesia was reviewed.                           - The risks and benefits of the procedure and the                            sedation options and risks were discussed with the                            patient. All questions were answered and informed                            consent was obtained.                           - ASA Grade Assessment: II - A patient with mild                            systemic disease.                           After obtaining informed consent, the endoscope was                            passed under direct vision. Throughout the  procedure, the patient's blood pressure, pulse, and                            oxygen saturations were monitored continuously. The                            [id number]-H190 ([id number]) scope was introduced through the                            mouth, and advanced to the second part of duodenum.                            The upper GI endoscopy was accomplished  without                            difficulty. The patient tolerated the procedure                            well. Scope In: 10:40:48 AM Scope Out: 10:48:07 AM Total Procedure Duration: 0 hours 7 minutes 19 seconds  Findings:      No endoscopic abnormality was evident in the esophagus to explain the       patient's complaint of dysphagia. It was decided, however, to proceed       with dilation of the entire esophagus. A guidewire was placed and the       scope was withdrawn. Dilation was performed with a Savary dilator with       mild resistance at 18 mm. The dilation site was examined following       endoscope reinsertion and showed mild mucosal disruption at the upper       esophagus.      A 1 cm hiatal hernia was present.      The entire examined stomach was normal. Biopsies were taken with a cold       forceps for Helicobacter pylori testing.      A single 10 mm mucosal nodule was found in the duodenal bulb. The nodule       was Paris classification Is (protruding, sessile). Biopsies were taken       with a cold forceps for histology. Impression:               - No endoscopic esophageal abnormality to explain                            patient's dysphagia. Esophagus dilated. Dilated.                           - 1 cm hiatal hernia.                           - Normal stomach. Biopsied.                           - Mucosal nodule found in the duodenum. Biopsied. Moderate Sedation:      Per Anesthesia Care Recommendation:           - Discharge patient to home (ambulatory).                           -  Resume previous diet.                           - Await pathology results. Procedure Code(s):        --- Professional ---                           [phone], Esophagogastroduodenoscopy, flexible,                            transoral; with insertion of guide wire followed by                            passage of dilator(s) through esophagus over guide                            wire                            43239, 59, Esophagogastroduodenoscopy, flexible,                            transoral; with biopsy, single or multiple Diagnosis Code(s):        --- Professional ---                           R13.10, Dysphagia, unspecified                           K44.9, Diaphragmatic hernia without obstruction or                            gangrene                           K31.89, Other diseases of stomach and duodenum                           R10.13, Epigastric pain                           R11.0, Nausea CPT copyright 2022 American Medical Association. All rights reserved. The codes documented in this report are preliminary and upon coder review may  be revised to meet current compliance requirements. Katrinka Blazing, MD Katrinka Blazing,  10/26/2022 10:55:40 AM This report has been signed electronically. Number of Addenda: 0

## 2022-10-26 NOTE — Transfer of Care (Signed)
Immediate Anesthesia Transfer of Care Note  Patient: Heather Espinoza  Procedure(s) Performed: ESOPHAGOGASTRODUODENOSCOPY (EGD) WITH PROPOFOL BIOPSY SAVORY DILATION  Patient Location: Short Stay  Anesthesia Type:General  Level of Consciousness: awake  Airway & Oxygen Therapy: Patient Spontanous Breathing  Post-op Assessment: Report given to RN  Post vital signs: Reviewed and stable  Last Vitals:  Vitals Value Taken Time  BP    Temp    Pulse    Resp    SpO2      Last Pain:  Vitals:   10/26/22 1037  TempSrc:   PainSc: 7       Patients Stated Pain Goal: 6 (10/26/22 0952)  Complications: No notable events documented.

## 2022-10-26 NOTE — Discharge Instructions (Signed)
You are being discharged to home.  Resume your previous diet.  We are waiting for your pathology results.  

## 2022-10-26 NOTE — Anesthesia Postprocedure Evaluation (Signed)
Anesthesia Post Note  Patient: Heather Espinoza  Procedure(s) Performed: ESOPHAGOGASTRODUODENOSCOPY (EGD) WITH PROPOFOL BIOPSY SAVORY DILATION  Patient location during evaluation: Short Stay Anesthesia Type: General Level of consciousness: awake Pain management: pain level controlled Vital Signs Assessment: post-procedure vital signs reviewed and stable Respiratory status: spontaneous breathing Cardiovascular status: blood pressure returned to baseline Postop Assessment: no apparent nausea or vomiting Anesthetic complications: no   No notable events documented.   Last Vitals:  Vitals:   10/26/22 0952  BP: (!) 127/56  Pulse: (!) 57  Resp: 16  Temp: 36.6 C  SpO2: 100%    Last Pain:  Vitals:   10/26/22 1037  TempSrc:   PainSc: 7                  ,

## 2022-10-26 NOTE — Interval H&P Note (Signed)
History and Physical Interval Note:  10/26/2022 9:28 AM  Heather Espinoza  has presented today for surgery, with the diagnosis of EPIGASTRIC PAIN.  The various methods of treatment have been discussed with the patient and family. After consideration of risks, benefits and other options for treatment, the patient has consented to  Procedure(s) with comments: ESOPHAGOGASTRODUODENOSCOPY (EGD) WITH PROPOFOL (N/A) - 11:00AM;ASA 3 as a surgical intervention.  The patient's history has been reviewed, patient examined, no change in status, stable for surgery.  I have reviewed the patient's chart and labs.  Questions were answered to the patient's satisfaction.     Katrinka Blazing Mayorga

## 2022-10-29 ENCOUNTER — Other Ambulatory Visit (INDEPENDENT_AMBULATORY_CARE_PROVIDER_SITE_OTHER): Payer: Self-pay | Admitting: Gastroenterology

## 2022-10-29 NOTE — Telephone Encounter (Signed)
 Last seen:  10/08/22

## 2022-10-30 ENCOUNTER — Encounter (HOSPITAL_COMMUNITY): Payer: Self-pay | Admitting: Gastroenterology

## 2022-10-30 ENCOUNTER — Encounter (INDEPENDENT_AMBULATORY_CARE_PROVIDER_SITE_OTHER): Payer: Self-pay | Admitting: *Deleted

## 2022-11-02 ENCOUNTER — Ambulatory Visit: Payer: Medicare Other | Admitting: Pulmonary Disease

## 2022-11-14 ENCOUNTER — Other Ambulatory Visit (INDEPENDENT_AMBULATORY_CARE_PROVIDER_SITE_OTHER): Payer: Self-pay | Admitting: Gastroenterology

## 2022-12-11 ENCOUNTER — Other Ambulatory Visit (INDEPENDENT_AMBULATORY_CARE_PROVIDER_SITE_OTHER): Payer: Self-pay | Admitting: Gastroenterology

## 2022-12-23 ENCOUNTER — Other Ambulatory Visit (INDEPENDENT_AMBULATORY_CARE_PROVIDER_SITE_OTHER): Payer: Self-pay | Admitting: Gastroenterology

## 2022-12-23 DIAGNOSIS — K582 Mixed irritable bowel syndrome: Secondary | ICD-10-CM

## 2022-12-25 ENCOUNTER — Telehealth (INDEPENDENT_AMBULATORY_CARE_PROVIDER_SITE_OTHER): Payer: Self-pay

## 2022-12-25 NOTE — Telephone Encounter (Signed)
Date: 12/25/2022 [facility] [address] [place], [place] [address] Member Name: Heather Espinoza Member ID Number: [id number] Thank you for trusting your Medicare prescription drug coverage to [employer] As our member, we want to help you get the most value from your prescription drug coverage and help you understand how your coverage works. As a member of [employer] we are pleased to inform you that, upon review of the information provided by you or your doctor, we have approved the requested coverage for the following prescription drug(s): DICYCLOMINE HCL Capsule Type of coverage approved: Prior Authorization This approval authorizes your coverage from 03/19/2022 - 12/25/2023, unless we notify you otherwise, and as long as the following conditions apply: ? you remain enrolled in our Medicare Part D prescription drug plan, ? your physician or other prescriber continues to prescribe the medication for you, and ? the medication continues to be safe for treating your condition. Depending upon the strength and/or formulation of the drug prescribed by your physician, different quantity limits or safety edits may apply. Please consult your Medicare Part D plan's formulary for the specific quantity limit. If you have not already filled your prescription for this approved drug, you may do so at a participating network pharmacy. Thank you for allowing [place] to serve you. This letter is informational only. No further action is required by you at this time. If you have questions or need help, please talk to your doctor or pharmacist, or contact Customer Care at [phone], 24 hours a day, 7 days a week. TTY users may call 7

## 2023-01-03 ENCOUNTER — Other Ambulatory Visit (INDEPENDENT_AMBULATORY_CARE_PROVIDER_SITE_OTHER): Payer: Self-pay | Admitting: Gastroenterology

## 2023-01-08 ENCOUNTER — Other Ambulatory Visit: Payer: Self-pay | Admitting: Neurosurgery

## 2023-01-09 ENCOUNTER — Other Ambulatory Visit (INDEPENDENT_AMBULATORY_CARE_PROVIDER_SITE_OTHER): Payer: Self-pay | Admitting: Gastroenterology

## 2023-01-09 DIAGNOSIS — K582 Mixed irritable bowel syndrome: Secondary | ICD-10-CM

## 2023-01-09 NOTE — Telephone Encounter (Signed)
 Last seen:  10/08/22

## 2023-01-21 ENCOUNTER — Other Ambulatory Visit: Payer: Self-pay | Admitting: Neurosurgery

## 2023-01-25 ENCOUNTER — Encounter (HOSPITAL_COMMUNITY): Payer: Self-pay

## 2023-01-25 ENCOUNTER — Other Ambulatory Visit: Payer: Self-pay

## 2023-01-25 ENCOUNTER — Encounter (HOSPITAL_COMMUNITY)
Admission: RE | Admit: 2023-01-25 | Discharge: 2023-01-25 | Disposition: A | Discharge status: Home / Self Care | Payer: Medicare Other | Source: Ambulatory Visit | Attending: Neurosurgery

## 2023-01-25 VITALS — BP 150/76 | HR 69 | Temp 98.4°F | Resp 17 | Ht 61.5 in | Wt 124.9 lb

## 2023-01-25 DIAGNOSIS — E1165 Type 2 diabetes mellitus with hyperglycemia: Secondary | ICD-10-CM | POA: Diagnosis not present

## 2023-01-25 DIAGNOSIS — R000 Tachycardia, unspecified: Secondary | ICD-10-CM | POA: Insufficient documentation

## 2023-01-25 DIAGNOSIS — Z01812 Encounter for preprocedural laboratory examination: Secondary | ICD-10-CM | POA: Insufficient documentation

## 2023-01-25 DIAGNOSIS — Z01818 Encounter for other preprocedural examination: Secondary | ICD-10-CM | POA: Diagnosis present

## 2023-01-25 HISTORY — DX: Headache, unspecified: R51.9

## 2023-01-25 HISTORY — DX: Family history of other specified conditions: Z84.89

## 2023-01-25 HISTORY — DX: Gastro-esophageal reflux disease without esophagitis: K21.9

## 2023-01-25 HISTORY — DX: Cardiac arrhythmia, unspecified: I49.9

## 2023-01-25 HISTORY — DX: Depression, unspecified: F32.A

## 2023-01-25 LAB — BASIC METABOLIC PANEL
Anion gap: 9 (ref 5–15)
BUN: 11 mg/dL (ref 8–23)
CO2: 25 mmol/L (ref 22–32)
Calcium: 9.2 mg/dL (ref 8.9–10.3)
Chloride: 106 mmol/L (ref 98–111)
Creatinine, Ser: 1.37 mg/dL — ABNORMAL HIGH (ref 0.44–1.00)
GFR, Estimated: 40 mL/min — ABNORMAL LOW (ref >60)
Glucose, Bld: 91 mg/dL (ref 70–99)
Potassium: 4.6 mmol/L (ref 3.5–5.1)
Sodium: 140 mmol/L (ref 135–145)

## 2023-01-25 LAB — CBC
HCT: 32.7 % — ABNORMAL LOW (ref 36.0–46.0)
Hemoglobin: 10.6 g/dL — ABNORMAL LOW (ref 12.0–15.0)
MCH: 31.9 pg (ref 26.0–34.0)
MCHC: 32.4 g/dL (ref 30.0–36.0)
MCV: 98.5 fL (ref 80.0–100.0)
Platelets: 257 10*3/uL (ref 150–400)
RBC: 3.32 MIL/uL — ABNORMAL LOW (ref 3.87–5.11)
RDW: 12.3 % (ref 11.5–15.5)
WBC: 6.4 10*3/uL (ref 4.0–10.5)
nRBC: 0 % (ref 0.0–0.2)

## 2023-01-25 LAB — SURGICAL PCR SCREEN
MRSA, PCR: NEGATIVE
Staphylococcus aureus: POSITIVE — AB

## 2023-01-25 LAB — TYPE AND SCREEN
ABO/RH(D): O POS
Antibody Screen: NEGATIVE

## 2023-01-25 LAB — GLUCOSE, CAPILLARY: Glucose-Capillary: 98 mg/dL (ref 70–99)

## 2023-01-25 LAB — HEMOGLOBIN A1C
Hgb A1c MFr Bld: 5.5 % (ref 4.8–5.6)
Mean Plasma Glucose: 111.15 mg/dL

## 2023-01-25 NOTE — Progress Notes (Signed)
PCP - Dr. Kateri Plummer Cardiologist - Denies  Chest x-ray - n/a EKG - 10/22/22 Stress Test - denies ECHO - 11/12/19 Cardiac Cath - 05/28/2000  Sleep Study - 04/27/20 Dr. Annalee Genta implanted an Aspire device, pt reports that the "remote box" at home doesn't work and hasn't worked for sometime. Pt instructed to reach out to ENT office for assistance regarding device.  Fasting Blood Sugar - 80's Checks Blood Sugar, rarely.  Blood Thinner Instructions: Aspirin Instructions: Stop 5 days per surgeon, pt stopped 01/18/23  ERAS Protcol - NPO PRE-SURGERY Ensure or G2-   COVID TEST- n/a  Anesthesia review: Yes, cardiac hx  Patient denies shortness of breath, fever, cough and chest pain at PAT appointment   All instructions explained to the patient, with a verbal understanding of the material. Patient agrees to go over the instructions while at home for a better understanding. Patient also instructed to self quarantine after being tested for COVID-19. The opportunity to ask questions was provided.

## 2023-01-25 NOTE — Pre-Procedure Instructions (Addendum)
Surgical Instructions   Your procedure is scheduled on January 31, 2023. Report to [facility] Main Entrance "A" at 5:30 A.M., then check in with the Admitting office. Any questions or running late day of surgery: call [phone]  Questions prior to your surgery date: call [phone], Monday-Friday, 8am-4pm. If you experience any cold or flu symptoms such as cough, fever, chills, shortness of breath, etc. between now and your scheduled surgery, please notify us at the above number.     Remember:  Do not eat or drink after midnight the night before your surgery   Take these medicines the morning of surgery with A SIP OF WATER : Duloxetine (Cymbalta) Levothyroxine (Synthroid) Omeprazole (Prilosec) Fluticasone-Salmeterol (Advair)  May take these medicines IF NEEDED: Acetaminophen (Tylenol) Albuterol (Proventil HFA) Dicyclomine (Bentyl) Ondansetron (Zofran) Tramadol (Ultram)  Per your Doctor, STOP ASPIRIN 5 DAYS prior to surgery.  One week prior to surgery, STOP Aleve, Naproxen, Ibuprofen, Motrin, Advil, Goody's, BC's, all herbal medications, fish oil, and non-prescription vitamins.  WHAT DO I DO ABOUT MY DIABETES MEDICATION?   Do not take oral diabetes medicines (pills) the morning of surgery. DO NOT TAKE METFORMIN (GLUCOPHAGE-XR) THE MORNING OF SURGERY.   HOW TO MANAGE YOUR DIABETES BEFORE AND AFTER SURGERY  Why is it important to control my blood sugar before and after surgery? Improving blood sugar levels before and after surgery helps healing and can limit problems. A way of improving blood sugar control is eating a healthy diet by:  Eating less sugar and carbohydrates  Increasing activity/exercise  Talking with your doctor about reaching your blood sugar goals High blood sugars (greater than 180 mg/dL) can raise your risk of infections and slow your recovery, so you will need to focus on controlling your diabetes during the weeks before surgery. Make sure that the  doctor who takes care of your diabetes knows about your planned surgery including the date and location.  How do I manage my blood sugar before surgery? Check your blood sugar at least 4 times a day, starting 2 days before surgery, to make sure that the level is not too high or low.  Check your blood sugar the morning of your surgery when you wake up and every 2 hours until you get to the Short Stay unit.  If your blood sugar is less than 70 mg/dL, you will need to treat for low blood sugar: Do not take insulin. Treat a low blood sugar (less than 70 mg/dL) with  cup of clear juice (cranberry or apple), 4 glucose tablets, OR glucose gel. Recheck blood sugar in 15 minutes after treatment (to make sure it is greater than 70 mg/dL). If your blood sugar is not greater than 70 mg/dL on recheck, call [phone] for further instructions. Report your blood sugar to the short stay nurse when you get to Short Stay.  If you are admitted to the hospital after surgery: Your blood sugar will be checked by the staff and you will probably be given insulin after surgery (instead of oral diabetes medicines) to make sure you have good blood sugar levels. The goal for blood sugar control after surgery is 80-180 mg/dL.                     Do NOT Smoke (Tobacco/Vaping) for 24 hours prior to your procedure.  If you use a CPAP at night, you may bring your mask/headgear for your overnight stay.   You will be asked to remove any contacts, glasses,  piercing's, hearing aid's, dentures/partials prior to surgery. Please bring cases for these items if needed.    Patients discharged the day of surgery will not be allowed to drive home, and someone needs to stay with them for 24 hours.  SURGICAL WAITING ROOM VISITATION Patients may have no more than 2 support people in the waiting area - these visitors may rotate.   Pre-op nurse will coordinate an appropriate time for 1 ADULT support person, who may not rotate, to  accompany patient in pre-op.  Children under the age of 50 must have an adult with them who is not the patient and must remain in the main waiting area with an adult.  If the patient needs to stay at the hospital during part of their recovery, the visitor guidelines for inpatient rooms apply.  Please refer to the [facility] website for the visitor guidelines for any additional information.   If you received a COVID test during your pre-op visit  it is requested that you wear a mask when out in public, stay away from anyone that may not be feeling well and notify your surgeon if you develop symptoms. If you have been in contact with anyone that has tested positive in the last 10 days please notify you surgeon.      Pre-operative 5 CHG Bathing Instructions   You can play a key role in reducing the risk of infection after surgery. Your skin needs to be as free of germs as possible. You can reduce the number of germs on your skin by washing with CHG (chlorhexidine gluconate) soap before surgery. CHG is an antiseptic soap that kills germs and continues to kill germs even after washing.   DO NOT use if you have an allergy to chlorhexidine/CHG or antibacterial soaps. If your skin becomes reddened or irritated, stop using the CHG and notify one of our RNs at [phone].   Please shower with the CHG soap starting 4 days before surgery using the following schedule:     Please keep in mind the following:  DO NOT shave, including legs and underarms, starting the day of your first shower.   You may shave your face at any point before/day of surgery.  Place clean sheets on your bed the day you start using CHG soap. Use a clean washcloth (not used since being washed) for each shower. DO NOT sleep with pets once you start using the CHG.   CHG Shower Instructions:  Wash your face and private area with normal soap. If you choose to wash your hair, wash first with your normal shampoo.  After you use  shampoo/soap, rinse your hair and body thoroughly to remove shampoo/soap residue.  Turn the water OFF and apply about 3 tablespoons (45 ml) of CHG soap to a CLEAN washcloth.  Apply CHG soap ONLY FROM YOUR NECK DOWN TO YOUR TOES (washing for 3-5 minutes)  DO NOT use CHG soap on face, private areas, open wounds, or sores.  Pay special attention to the area where your surgery is being performed.  If you are having back surgery, having someone wash your back for you may be helpful. Wait 2 minutes after CHG soap is applied, then you may rinse off the CHG soap.  Pat dry with a clean towel  Put on clean clothes/pajamas   If you choose to wear lotion, please use ONLY the CHG-compatible lotions on the back of this paper.   Additional instructions for the day of surgery: DO NOT APPLY  any lotions, deodorants, cologne, or perfumes.   Do not bring valuables to the hospital. [facility] is not responsible for any belongings/valuables. Do not wear nail polish, gel polish, artificial nails, or any other type of covering on natural nails (fingers and toes) Do not wear jewelry or makeup Put on clean/comfortable clothes.  Please brush your teeth.  Ask your nurse before applying any prescription medications to the skin.     CHG Compatible Lotions   Aveeno Moisturizing lotion  Cetaphil Moisturizing Cream  Cetaphil Moisturizing Lotion  Clairol Herbal Essence Moisturizing Lotion, Dry Skin  Clairol Herbal Essence Moisturizing Lotion, Extra Dry Skin  Clairol Herbal Essence Moisturizing Lotion, Normal Skin  Curel Age Defying Therapeutic Moisturizing Lotion with Alpha Hydroxy  Curel Extreme Care Body Lotion  Curel Soothing Hands Moisturizing Hand Lotion  Curel Therapeutic Moisturizing Cream, Fragrance-Free  Curel Therapeutic Moisturizing Lotion, Fragrance-Free  Curel Therapeutic Moisturizing Lotion, Original Formula  Eucerin Daily Replenishing Lotion  Eucerin Dry Skin Therapy Plus Alpha Hydroxy Crme   Eucerin Dry Skin Therapy Plus Alpha Hydroxy Lotion  Eucerin Original Crme  Eucerin Original Lotion  Eucerin Plus Crme Eucerin Plus Lotion  Eucerin TriLipid Replenishing Lotion  Keri Anti-Bacterial Hand Lotion  Keri Deep Conditioning Original Lotion Dry Skin Formula Softly Scented  Keri Deep Conditioning Original Lotion, Fragrance Free Sensitive Skin Formula  Keri Lotion Fast Absorbing Fragrance Free Sensitive Skin Formula  Keri Lotion Fast Absorbing Softly Scented Dry Skin Formula  Keri Original Lotion  Keri Skin Renewal Lotion Keri Silky Smooth Lotion  Keri Silky Smooth Sensitive Skin Lotion  Nivea Body Creamy Conditioning Oil  Nivea Body Extra Enriched Lotion  Nivea Body Original Lotion  Nivea Body Sheer Moisturizing Lotion Nivea Crme  Nivea Skin Firming Lotion  NutraDerm 30 Skin Lotion  NutraDerm Skin Lotion  NutraDerm Therapeutic Skin Cream  NutraDerm Therapeutic Skin Lotion  ProShield Protective Hand Cream  Provon moisturizing lotion  Please read over the following fact sheets that you were given.

## 2023-01-31 ENCOUNTER — Encounter (HOSPITAL_COMMUNITY): Payer: Self-pay | Admitting: Neurosurgery

## 2023-01-31 ENCOUNTER — Inpatient Hospital Stay (HOSPITAL_COMMUNITY): Payer: Medicare Other

## 2023-01-31 ENCOUNTER — Inpatient Hospital Stay (HOSPITAL_COMMUNITY): Payer: Medicare Other | Admitting: Anesthesiology

## 2023-01-31 ENCOUNTER — Inpatient Hospital Stay (HOSPITAL_COMMUNITY): Payer: Self-pay | Admitting: Physician Assistant

## 2023-01-31 ENCOUNTER — Other Ambulatory Visit: Payer: Self-pay

## 2023-01-31 ENCOUNTER — Inpatient Hospital Stay (HOSPITAL_COMMUNITY)
Admission: RE | Disposition: A | Discharge status: Home / Self Care | Payer: Self-pay | Source: Home / Self Care | Attending: Neurosurgery

## 2023-01-31 ENCOUNTER — Inpatient Hospital Stay (HOSPITAL_COMMUNITY)
Admission: RE | Admit: 2023-01-31 | Discharge: 2023-02-01 | DRG: 472 | Disposition: A | Discharge status: Home / Self Care | Payer: Medicare Other | Attending: Neurosurgery | Admitting: Neurosurgery

## 2023-01-31 DIAGNOSIS — Z800 Family history of malignant neoplasm of digestive organs: Secondary | ICD-10-CM | POA: Diagnosis not present

## 2023-01-31 DIAGNOSIS — Z833 Family history of diabetes mellitus: Secondary | ICD-10-CM

## 2023-01-31 DIAGNOSIS — F32A Depression, unspecified: Secondary | ICD-10-CM | POA: Diagnosis present

## 2023-01-31 DIAGNOSIS — I10 Essential (primary) hypertension: Secondary | ICD-10-CM | POA: Diagnosis present

## 2023-01-31 DIAGNOSIS — M8580 Other specified disorders of bone density and structure, unspecified site: Secondary | ICD-10-CM | POA: Diagnosis present

## 2023-01-31 DIAGNOSIS — Z825 Family history of asthma and other chronic lower respiratory diseases: Secondary | ICD-10-CM

## 2023-01-31 DIAGNOSIS — Z820 Family history of epilepsy and other diseases of the nervous system: Secondary | ICD-10-CM

## 2023-01-31 DIAGNOSIS — Z7982 Long term (current) use of aspirin: Secondary | ICD-10-CM | POA: Diagnosis not present

## 2023-01-31 DIAGNOSIS — Z823 Family history of stroke: Secondary | ICD-10-CM | POA: Diagnosis not present

## 2023-01-31 DIAGNOSIS — Z79890 Hormone replacement therapy (postmenopausal): Secondary | ICD-10-CM | POA: Diagnosis not present

## 2023-01-31 DIAGNOSIS — Z7951 Long term (current) use of inhaled steroids: Secondary | ICD-10-CM | POA: Diagnosis not present

## 2023-01-31 DIAGNOSIS — Z8616 Personal history of COVID-19: Secondary | ICD-10-CM | POA: Diagnosis not present

## 2023-01-31 DIAGNOSIS — E7800 Pure hypercholesterolemia, unspecified: Secondary | ICD-10-CM | POA: Diagnosis present

## 2023-01-31 DIAGNOSIS — J449 Chronic obstructive pulmonary disease, unspecified: Secondary | ICD-10-CM | POA: Diagnosis present

## 2023-01-31 DIAGNOSIS — Z882 Allergy status to sulfonamides status: Secondary | ICD-10-CM

## 2023-01-31 DIAGNOSIS — Z01818 Encounter for other preprocedural examination: Secondary | ICD-10-CM

## 2023-01-31 DIAGNOSIS — M4712 Other spondylosis with myelopathy, cervical region: Secondary | ICD-10-CM | POA: Diagnosis present

## 2023-01-31 DIAGNOSIS — Z87442 Personal history of urinary calculi: Secondary | ICD-10-CM

## 2023-01-31 DIAGNOSIS — G4730 Sleep apnea, unspecified: Secondary | ICD-10-CM | POA: Diagnosis present

## 2023-01-31 DIAGNOSIS — Z887 Allergy status to serum and vaccine status: Secondary | ICD-10-CM

## 2023-01-31 DIAGNOSIS — Z801 Family history of malignant neoplasm of trachea, bronchus and lung: Secondary | ICD-10-CM

## 2023-01-31 DIAGNOSIS — M4722 Other spondylosis with radiculopathy, cervical region: Secondary | ICD-10-CM

## 2023-01-31 DIAGNOSIS — E119 Type 2 diabetes mellitus without complications: Secondary | ICD-10-CM | POA: Diagnosis present

## 2023-01-31 DIAGNOSIS — Z8049 Family history of malignant neoplasm of other genital organs: Secondary | ICD-10-CM | POA: Diagnosis not present

## 2023-01-31 DIAGNOSIS — E1165 Type 2 diabetes mellitus with hyperglycemia: Secondary | ICD-10-CM

## 2023-01-31 DIAGNOSIS — R519 Headache, unspecified: Secondary | ICD-10-CM | POA: Diagnosis present

## 2023-01-31 DIAGNOSIS — Z7984 Long term (current) use of oral hypoglycemic drugs: Secondary | ICD-10-CM | POA: Diagnosis not present

## 2023-01-31 DIAGNOSIS — Z91040 Latex allergy status: Secondary | ICD-10-CM

## 2023-01-31 DIAGNOSIS — K219 Gastro-esophageal reflux disease without esophagitis: Secondary | ICD-10-CM | POA: Diagnosis present

## 2023-01-31 DIAGNOSIS — Z888 Allergy status to other drugs, medicaments and biological substances status: Secondary | ICD-10-CM

## 2023-01-31 DIAGNOSIS — M797 Fibromyalgia: Secondary | ICD-10-CM | POA: Diagnosis present

## 2023-01-31 DIAGNOSIS — Z79899 Other long term (current) drug therapy: Secondary | ICD-10-CM

## 2023-01-31 DIAGNOSIS — M4802 Spinal stenosis, cervical region: Principal | ICD-10-CM | POA: Diagnosis present

## 2023-01-31 DIAGNOSIS — G2581 Restless legs syndrome: Secondary | ICD-10-CM | POA: Diagnosis present

## 2023-01-31 DIAGNOSIS — Z886 Allergy status to analgesic agent status: Secondary | ICD-10-CM

## 2023-01-31 DIAGNOSIS — E039 Hypothyroidism, unspecified: Secondary | ICD-10-CM | POA: Diagnosis present

## 2023-01-31 DIAGNOSIS — Z90710 Acquired absence of both cervix and uterus: Secondary | ICD-10-CM

## 2023-01-31 HISTORY — PX: ANTERIOR CERVICAL DECOMPRESSION/DISCECTOMY FUSION 4 LEVELS: SHX5556

## 2023-01-31 LAB — GLUCOSE, CAPILLARY
Glucose-Capillary: 94 mg/dL (ref 70–99)
Glucose-Capillary: 153 mg/dL — ABNORMAL HIGH (ref 70–99)
Glucose-Capillary: 156 mg/dL — ABNORMAL HIGH (ref 70–99)
Glucose-Capillary: 151 mg/dL — ABNORMAL HIGH (ref 70–99)

## 2023-01-31 SURGERY — ANTERIOR CERVICAL DECOMPRESSION/DISCECTOMY FUSION 4 LEVELS
Anesthesia: General

## 2023-01-31 MED ORDER — THROMBIN 5000 UNITS EX SOLR
CUTANEOUS | Status: AC
  Filled 2023-01-31: qty 5000

## 2023-01-31 MED ORDER — OXYCODONE HCL 5 MG PO TABS
10.0000 mg | ORAL_TABLET | ORAL | Status: DC | PRN
Start: 2023-01-31 — End: 2023-02-01

## 2023-01-31 MED ORDER — DEXAMETHASONE SODIUM PHOSPHATE 10 MG/ML IJ SOLN
INTRAMUSCULAR | Status: DC | PRN
  Administered 2023-01-31: 10 mg via INTRAVENOUS

## 2023-01-31 MED ORDER — CEFAZOLIN SODIUM-DEXTROSE 2-4 GM/100ML-% IV SOLN
2.0000 g | Freq: Three times a day (TID) | INTRAVENOUS | Status: AC
  Administered 2023-01-31 – 2023-02-01 (×2): 2 g via INTRAVENOUS
  Filled 2023-01-31 (×2): qty 100

## 2023-01-31 MED ORDER — MENTHOL 3 MG MT LOZG: 1.0000 | LOZENGE | OROMUCOSAL | Status: DC | PRN

## 2023-01-31 MED ORDER — SENNA 8.6 MG PO TABS
1.0000 | ORAL_TABLET | Freq: Two times a day (BID) | ORAL | Status: DC | PRN
  Administered 2023-01-31: 8.6 mg via ORAL
  Filled 2023-01-31: qty 1

## 2023-01-31 MED ORDER — EPHEDRINE SULFATE-NACL 50-0.9 MG/10ML-% IV SOSY
PREFILLED_SYRINGE | INTRAVENOUS | Status: DC | PRN
  Administered 2023-01-31: 5 mg via INTRAVENOUS

## 2023-01-31 MED ORDER — ROCURONIUM BROMIDE 10 MG/ML (PF) SYRINGE
PREFILLED_SYRINGE | INTRAVENOUS | Status: DC | PRN
  Administered 2023-01-31 (×2): 20 mg via INTRAVENOUS
  Administered 2023-01-31: 50 mg via INTRAVENOUS
  Administered 2023-01-31 (×2): 20 mg via INTRAVENOUS

## 2023-01-31 MED ORDER — ZOLPIDEM TARTRATE 5 MG PO TABS: 5.0000 mg | ORAL_TABLET | Freq: Every evening | ORAL | Status: DC | PRN

## 2023-01-31 MED ORDER — PHENYLEPHRINE 80 MCG/ML (10ML) SYRINGE FOR IV PUSH (FOR BLOOD PRESSURE SUPPORT)
PREFILLED_SYRINGE | INTRAVENOUS | Status: DC | PRN
  Administered 2023-01-31: 160 ug via INTRAVENOUS
  Administered 2023-01-31: 80 ug via INTRAVENOUS

## 2023-01-31 MED ORDER — CEFAZOLIN SODIUM-DEXTROSE 2-4 GM/100ML-% IV SOLN
2.0000 g | INTRAVENOUS | Status: AC
  Administered 2023-01-31: 2 g via INTRAVENOUS

## 2023-01-31 MED ORDER — BACITRACIN ZINC 500 UNIT/GM EX OINT
TOPICAL_OINTMENT | CUTANEOUS | Status: AC
  Filled 2023-01-31: qty 28.35

## 2023-01-31 MED ORDER — MIDAZOLAM HCL 2 MG/2ML IJ SOLN
INTRAMUSCULAR | Status: AC
  Filled 2023-01-31: qty 2

## 2023-01-31 MED ORDER — CHLORHEXIDINE GLUCONATE CLOTH 2 % EX PADS: 6.0000 | MEDICATED_PAD | Freq: Once | CUTANEOUS | Status: DC

## 2023-01-31 MED ORDER — ACETAMINOPHEN 325 MG PO TABS: 650.0000 mg | ORAL_TABLET | ORAL | Status: DC | PRN

## 2023-01-31 MED ORDER — LEVOTHYROXINE SODIUM 75 MCG PO TABS
37.5000 ug | ORAL_TABLET | Freq: Every day | ORAL | Status: DC
  Administered 2023-02-01: 37.5 ug via ORAL
  Filled 2023-01-31: qty 1

## 2023-01-31 MED ORDER — MORPHINE SULFATE (PF) 4 MG/ML IV SOLN
4.0000 mg | INTRAVENOUS | Status: DC | PRN
Start: 2023-01-31 — End: 2023-02-01
  Administered 2023-01-31: 4 mg via INTRAVENOUS
  Filled 2023-01-31: qty 1

## 2023-01-31 MED ORDER — FENTANYL CITRATE (PF) 100 MCG/2ML IJ SOLN
25.0000 ug | INTRAMUSCULAR | Status: DC | PRN
  Administered 2023-01-31: 25 ug via INTRAVENOUS
  Administered 2023-01-31: 50 ug via INTRAVENOUS
  Administered 2023-01-31: 25 ug via INTRAVENOUS

## 2023-01-31 MED ORDER — PHENYLEPHRINE HCL-NACL 20-0.9 MG/250ML-% IV SOLN
INTRAVENOUS | Status: AC
  Filled 2023-01-31: qty 500

## 2023-01-31 MED ORDER — METOCLOPRAMIDE HCL 5 MG/ML IJ SOLN
5.0000 mg | Freq: Four times a day (QID) | INTRAMUSCULAR | Status: DC | PRN
  Administered 2023-01-31: 10 mg via INTRAVENOUS
  Filled 2023-01-31: qty 2

## 2023-01-31 MED ORDER — SIMVASTATIN 20 MG PO TABS
20.0000 mg | ORAL_TABLET | Freq: Every evening | ORAL | Status: DC
  Administered 2023-01-31: 20 mg via ORAL
  Filled 2023-01-31: qty 1

## 2023-01-31 MED ORDER — ALUM & MAG HYDROXIDE-SIMETH 200-200-20 MG/5ML PO SUSP: 30.0000 mL | Freq: Four times a day (QID) | ORAL | Status: DC | PRN

## 2023-01-31 MED ORDER — DEXAMETHASONE SODIUM PHOSPHATE 4 MG/ML IJ SOLN
4.0000 mg | Freq: Four times a day (QID) | INTRAMUSCULAR | Status: AC
  Administered 2023-01-31 – 2023-02-01 (×2): 4 mg via INTRAVENOUS
  Filled 2023-01-31 (×2): qty 1

## 2023-01-31 MED ORDER — PANTOPRAZOLE SODIUM 40 MG IV SOLR: 40.0000 mg | Freq: Every day | INTRAVENOUS | Status: DC

## 2023-01-31 MED ORDER — KCL IN DEXTROSE-NACL 20-5-0.45 MEQ/L-%-% IV SOLN
INTRAVENOUS | Status: DC
  Filled 2023-01-31: qty 1000

## 2023-01-31 MED ORDER — BACITRACIN ZINC 500 UNIT/GM EX OINT
TOPICAL_OINTMENT | CUTANEOUS | Status: DC | PRN
  Administered 2023-01-31: 1 via TOPICAL

## 2023-01-31 MED ORDER — MIDAZOLAM HCL 2 MG/2ML IJ SOLN
INTRAMUSCULAR | Status: DC | PRN
  Administered 2023-01-31 (×2): 1 mg via INTRAVENOUS

## 2023-01-31 MED ORDER — DOCUSATE SODIUM 100 MG PO CAPS
100.0000 mg | ORAL_CAPSULE | Freq: Two times a day (BID) | ORAL | Status: DC
  Administered 2023-02-01: 100 mg via ORAL
  Filled 2023-01-31 (×2): qty 1

## 2023-01-31 MED ORDER — CHLORHEXIDINE GLUCONATE 0.12 % MT SOLN
15.0000 mL | Freq: Once | OROMUCOSAL | Status: AC
  Administered 2023-01-31: 15 mL via OROMUCOSAL

## 2023-01-31 MED ORDER — ALBUTEROL SULFATE (2.5 MG/3ML) 0.083% IN NEBU: 3.0000 mL | INHALATION_SOLUTION | Freq: Four times a day (QID) | RESPIRATORY_TRACT | Status: DC | PRN

## 2023-01-31 MED ORDER — FENTANYL CITRATE (PF) 250 MCG/5ML IJ SOLN
INTRAMUSCULAR | Status: AC
  Filled 2023-01-31: qty 5

## 2023-01-31 MED ORDER — ACETAMINOPHEN 500 MG PO TABS: 1000.0000 mg | ORAL_TABLET | Freq: Once | ORAL | Status: DC

## 2023-01-31 MED ORDER — FENTANYL CITRATE (PF) 250 MCG/5ML IJ SOLN
INTRAMUSCULAR | Status: DC | PRN
  Administered 2023-01-31: 100 ug via INTRAVENOUS
  Administered 2023-01-31: 50 ug via INTRAVENOUS
  Administered 2023-01-31: 100 ug via INTRAVENOUS

## 2023-01-31 MED ORDER — PHENYLEPHRINE HCL-NACL 20-0.9 MG/250ML-% IV SOLN: INTRAVENOUS | Status: DC | PRN

## 2023-01-31 MED ORDER — FENTANYL CITRATE (PF) 100 MCG/2ML IJ SOLN
INTRAMUSCULAR | Status: AC
  Filled 2023-01-31: qty 2

## 2023-01-31 MED ORDER — BUPIVACAINE-EPINEPHRINE (PF) 0.5% -1:200000 IJ SOLN
INTRAMUSCULAR | Status: AC
Start: 2023-01-31 — End: ?
  Filled 2023-01-31: qty 30

## 2023-01-31 MED ORDER — ACETAMINOPHEN 500 MG PO TABS
1000.0000 mg | ORAL_TABLET | Freq: Four times a day (QID) | ORAL | Status: AC
  Administered 2023-01-31 – 2023-02-01 (×4): 1000 mg via ORAL
  Filled 2023-01-31 (×4): qty 2

## 2023-01-31 MED ORDER — DICYCLOMINE HCL 10 MG PO CAPS
10.0000 mg | ORAL_CAPSULE | Freq: Three times a day (TID) | ORAL | Status: DC
Start: 2023-01-31 — End: 2023-02-01
  Administered 2023-02-01 (×2): 10 mg via ORAL
  Filled 2023-01-31 (×5): qty 1

## 2023-01-31 MED ORDER — LISINOPRIL 10 MG PO TABS
10.0000 mg | ORAL_TABLET | Freq: Every morning | ORAL | Status: DC
  Administered 2023-02-01: 10 mg via ORAL
  Filled 2023-01-31: qty 1

## 2023-01-31 MED ORDER — OXYCODONE HCL 5 MG PO TABS
5.0000 mg | ORAL_TABLET | ORAL | Status: DC | PRN
  Administered 2023-01-31: 5 mg via ORAL
  Filled 2023-01-31: qty 1

## 2023-01-31 MED ORDER — 0.9 % SODIUM CHLORIDE (POUR BTL) OPTIME
TOPICAL | Status: DC | PRN
  Administered 2023-01-31: 1000 mL

## 2023-01-31 MED ORDER — DEXAMETHASONE 4 MG PO TABS: 4.0000 mg | ORAL_TABLET | Freq: Four times a day (QID) | ORAL | Status: AC

## 2023-01-31 MED ORDER — PROPOFOL 10 MG/ML IV BOLUS
INTRAVENOUS | Status: AC
  Filled 2023-01-31: qty 20

## 2023-01-31 MED ORDER — ORAL CARE MOUTH RINSE: 15.0000 mL | Freq: Once | OROMUCOSAL | Status: AC

## 2023-01-31 MED ORDER — PROPOFOL 10 MG/ML IV BOLUS
INTRAVENOUS | Status: DC | PRN
  Administered 2023-01-31: 120 mg via INTRAVENOUS

## 2023-01-31 MED ORDER — TRAMADOL HCL 50 MG PO TABS: 50.0000 mg | ORAL_TABLET | Freq: Four times a day (QID) | ORAL | Status: DC | PRN

## 2023-01-31 MED ORDER — DULOXETINE HCL 30 MG PO CPEP
60.0000 mg | ORAL_CAPSULE | Freq: Every morning | ORAL | Status: DC
  Administered 2023-02-01: 60 mg via ORAL
  Filled 2023-01-31: qty 2

## 2023-01-31 MED ORDER — POLYSACCHARIDE IRON COMPLEX 150 MG PO CAPS
150.0000 mg | ORAL_CAPSULE | Freq: Every day | ORAL | Status: DC
  Administered 2023-02-01: 150 mg via ORAL
  Filled 2023-01-31: qty 1

## 2023-01-31 MED ORDER — BUPIVACAINE-EPINEPHRINE (PF) 0.5% -1:200000 IJ SOLN
INTRAMUSCULAR | Status: DC | PRN
  Administered 2023-01-31: 10 mL

## 2023-01-31 MED ORDER — BISACODYL 10 MG RE SUPP: 10.0000 mg | Freq: Every day | RECTAL | Status: DC | PRN

## 2023-01-31 MED ORDER — FAMOTIDINE 20 MG PO TABS
40.0000 mg | ORAL_TABLET | Freq: Every day | ORAL | Status: DC
  Administered 2023-01-31: 40 mg via ORAL
  Filled 2023-01-31: qty 2

## 2023-01-31 MED ORDER — LACTATED RINGERS IV SOLN: INTRAVENOUS | Status: DC

## 2023-01-31 MED ORDER — PHENOL 1.4 % MT LIQD: 1.0000 | OROMUCOSAL | Status: DC | PRN

## 2023-01-31 MED ORDER — PANTOPRAZOLE SODIUM 40 MG PO TBEC
40.0000 mg | DELAYED_RELEASE_TABLET | Freq: Every day | ORAL | Status: DC
Start: 2023-01-31 — End: 2023-02-01
  Administered 2023-01-31: 40 mg via ORAL
  Filled 2023-01-31: qty 1

## 2023-01-31 MED ORDER — CYCLOBENZAPRINE HCL 10 MG PO TABS
10.0000 mg | ORAL_TABLET | Freq: Three times a day (TID) | ORAL | Status: DC | PRN
  Administered 2023-02-01: 10 mg via ORAL
  Filled 2023-01-31: qty 1

## 2023-01-31 MED ORDER — LIDOCAINE 2% (20 MG/ML) 5 ML SYRINGE
INTRAMUSCULAR | Status: DC | PRN
  Administered 2023-01-31: 40 mg via INTRAVENOUS

## 2023-01-31 MED ORDER — METFORMIN HCL ER 500 MG PO TB24
500.0000 mg | ORAL_TABLET | Freq: Every day | ORAL | Status: DC
  Administered 2023-01-31: 500 mg via ORAL
  Filled 2023-01-31: qty 1

## 2023-01-31 MED ORDER — ACETAMINOPHEN 650 MG RE SUPP: 650.0000 mg | RECTAL | Status: DC | PRN

## 2023-01-31 MED ORDER — SUGAMMADEX SODIUM 200 MG/2ML IV SOLN
INTRAVENOUS | Status: DC | PRN
  Administered 2023-01-31: 108.8 mg via INTRAVENOUS

## 2023-01-31 MED ORDER — THROMBIN 5000 UNITS EX SOLR
OROMUCOSAL | Status: DC | PRN
  Administered 2023-01-31 (×3): 5 mL via TOPICAL

## 2023-01-31 MED ORDER — MOMETASONE FURO-FORMOTEROL FUM 200-5 MCG/ACT IN AERO
2.0000 | INHALATION_SPRAY | Freq: Two times a day (BID) | RESPIRATORY_TRACT | Status: DC
  Administered 2023-02-01: 2 via RESPIRATORY_TRACT
  Filled 2023-01-31: qty 8.8

## 2023-01-31 SURGICAL SUPPLY — 63 items
BAG COUNTER SPONGE SURGICOUNT (BAG) ×1 IMPLANT
BENZOIN TINCTURE PRP APPL 2/3 (GAUZE/BANDAGES/DRESSINGS) ×2 IMPLANT
BIT DRILL NEURO 2X3.1 SFT TUCH (MISCELLANEOUS) ×1 IMPLANT
BLADE SURG 15 STRL LF DISP TIS (BLADE) ×1 IMPLANT
BLADE SURG 15 STRL SS (BLADE) ×1
BLADE ULTRA TIP 2M (BLADE) ×1 IMPLANT
BUR BARREL STRAIGHT FLUTE 4.0 (BURR) ×1 IMPLANT
BUR MATCHSTICK NEURO 3.0 LAGG (BURR) ×1 IMPLANT
CAGE PEEK VISTAS 11X14X6 (Cage) IMPLANT
CANISTER SUCT 3000ML PPV (MISCELLANEOUS) ×1 IMPLANT
COVER MAYO STAND STRL (DRAPES) ×1 IMPLANT
DERMABOND ADVANCED .7 DNX12 (GAUZE/BANDAGES/DRESSINGS) IMPLANT
DRAIN CHANNEL 10M FLAT 3/4 FLT (DRAIN) IMPLANT
DRAPE LAPAROTOMY 100X72 PEDS (DRAPES) ×1 IMPLANT
DRAPE MICROSCOPE SLANT 54X150 (MISCELLANEOUS) IMPLANT
DRAPE SURG 17X23 STRL (DRAPES) ×2 IMPLANT
DRILL NEURO 2X3.1 SOFT TOUCH (MISCELLANEOUS) ×1
DRSG OPSITE POSTOP 3X4 (GAUZE/BANDAGES/DRESSINGS) ×1 IMPLANT
DRSG OPSITE POSTOP 4X6 (GAUZE/BANDAGES/DRESSINGS) IMPLANT
ELECT COATED BLADE 2.86 ST (ELECTRODE) IMPLANT
ELECT REM PT RETURN 9FT ADLT (ELECTROSURGICAL) ×1
ELECTRODE REM PT RTRN 9FT ADLT (ELECTROSURGICAL) ×1 IMPLANT
EVACUATOR SILICONE 100CC (DRAIN) IMPLANT
GAUZE 4X4 16PLY ~~LOC~~+RFID DBL (SPONGE) IMPLANT
GLOVE EXAM NITRILE XL STR (GLOVE) IMPLANT
GLOVE SKINSENSE STRL SZ7.0 (GLOVE) IMPLANT
GLOVE SURG SS PI 7.0 STRL IVOR (GLOVE) IMPLANT
GLOVE SURG SS PI 8.0 STRL IVOR (GLOVE) IMPLANT
GLOVE SURG SS PI 8.5 STRL STRW (GLOVE) IMPLANT
GOWN STRL REUS W/ TWL LRG LVL3 (GOWN DISPOSABLE) ×1 IMPLANT
GOWN STRL REUS W/ TWL XL LVL3 (GOWN DISPOSABLE) IMPLANT
GOWN STRL REUS W/TWL LRG LVL3 (GOWN DISPOSABLE) ×1
GOWN STRL REUS W/TWL XL LVL3 (GOWN DISPOSABLE)
HEMOSTAT POWDER KIT SURGIFOAM (HEMOSTASIS) ×1 IMPLANT
KIT BASIN OR (CUSTOM PROCEDURE TRAY) ×1 IMPLANT
KIT TURNOVER KIT B (KITS) ×1 IMPLANT
MARKER SKIN DUAL TIP RULER LAB (MISCELLANEOUS) ×1 IMPLANT
NDL HYPO 22X1.5 SAFETY MO (MISCELLANEOUS) ×1 IMPLANT
NDL SPNL 18GX3.5 QUINCKE PK (NEEDLE) ×1 IMPLANT
NEEDLE HYPO 22X1.5 SAFETY MO (MISCELLANEOUS) ×1 IMPLANT
NEEDLE SPNL 18GX3.5 QUINCKE PK (NEEDLE) ×1 IMPLANT
NS IRRIG 1000ML POUR BTL (IV SOLUTION) ×1 IMPLANT
PACK LAMINECTOMY NEURO (CUSTOM PROCEDURE TRAY) ×1 IMPLANT
PATTIES SURGICAL .5 X1 (DISPOSABLE) IMPLANT
PATTIES SURGICAL 1X1 (DISPOSABLE) IMPLANT
PEEK OPTIMA VISTA-S 11X14X5MM (Peek) IMPLANT
PIN DISTRACTION 14MM (PIN) ×2 IMPLANT
PLATE ANT CERV XTEND 4 LV 60 (Plate) IMPLANT
PUTTY DBM 5CC CALC GRAN (Putty) IMPLANT
SCREW XTD VAR 4.2 SELF TAP 12 (Screw) IMPLANT
SOL PREP POV-IOD 4OZ 10% (MISCELLANEOUS) IMPLANT
SOL SCRUB PVP POV-IOD 4OZ 7.5% (MISCELLANEOUS) ×1
SOLUTION SCRB POV-IOD 4OZ 7.5% (MISCELLANEOUS) IMPLANT
SPIKE FLUID TRANSFER (MISCELLANEOUS) ×1 IMPLANT
SPONGE INTESTINAL PEANUT (DISPOSABLE) ×2 IMPLANT
SPONGE SURGIFOAM ABS GEL 100 (HEMOSTASIS) IMPLANT
STRIP CLOSURE SKIN 1/2X4 (GAUZE/BANDAGES/DRESSINGS) ×1 IMPLANT
SUT VIC AB 0 CT1 27XBRD ANTBC (SUTURE) ×1 IMPLANT
SUT VIC AB 3-0 SH 8-18 (SUTURE) ×1 IMPLANT
TOWEL GREEN STERILE (TOWEL DISPOSABLE) ×1 IMPLANT
TOWEL GREEN STERILE FF (TOWEL DISPOSABLE) ×1 IMPLANT
TRAY FOL W/BAG SLVR 16FR STRL (SET/KITS/TRAYS/PACK) IMPLANT
WATER STERILE IRR 1000ML POUR (IV SOLUTION) ×1 IMPLANT

## 2023-01-31 NOTE — Anesthesia Preprocedure Evaluation (Addendum)
Anesthesia Evaluation  Patient identified by MRN, date of birth, ID band Patient awake    Reviewed: Allergy & Precautions, NPO status , Patient's Chart, lab work & pertinent test results  Airway Mallampati: II  TM Distance: >3 FB Neck ROM: Full    Dental no notable dental hx.    Pulmonary sleep apnea , COPD   Pulmonary exam normal        Cardiovascular hypertension, Pt. on medications + dysrhythmias  Rhythm:Regular Rate:Normal     Neuro/Psych  Headaches   Depression       GI/Hepatic Neg liver ROS,GERD  ,,  Endo/Other  diabetes, Type 2, Oral Hypoglycemic AgentsHypothyroidism    Renal/GU   negative genitourinary   Musculoskeletal  (+) Arthritis ,  Fibromyalgia -  Abdominal Normal abdominal exam  (+)   Peds  Hematology  (+) Blood dyscrasia, anemia Lab Results      Component                Value               Date                      WBC                      6.4                 01/25/2023                HGB                      10.6 (L)            01/25/2023                HCT                      32.7 (L)            01/25/2023                MCV                      98.5                01/25/2023                PLT                      257                 01/25/2023              Anesthesia Other Findings   Reproductive/Obstetrics                             Anesthesia Physical Anesthesia Plan  ASA: 3  Anesthesia Plan: General   Post-op Pain Management: Toradol IV (intra-op)*, Ofirmev IV (intra-op)* and Ketamine IV*   Induction: Intravenous  PONV Risk Score and Plan: 3 and Ondansetron, Dexamethasone and Treatment may vary due to age or medical condition  Airway Management Planned: Mask and Oral ETT  Additional Equipment: None  Intra-op Plan:   Post-operative Plan: Extubation in OR  Informed Consent: I have reviewed the patients History and Physical, chart, labs and discussed  the procedure including the risks, benefits and  alternatives for the proposed anesthesia with the patient or authorized representative who has indicated his/her understanding and acceptance.     Dental advisory given  Plan Discussed with: CRNA  Anesthesia Plan Comments:        Anesthesia Quick Evaluation

## 2023-01-31 NOTE — H&P (Signed)
Subjective: The patient is a 77 year old white female who is complaining of neck and bilateral arm pain consistent with a cervical radiculopathy.  She has failed medical management.  She was worked up with a cervical MRI and x-rays which demonstrated multilevel spondylosis and stenosis.  I discussed the various treatment options with her.  She has decided proceed with surgery.  Past Medical History:  Diagnosis Date   Anemia    Arthritis    COPD (chronic obstructive pulmonary disease) (HCC)    COVID-19 11/11/2020   Depression    Diabetes (HCC)    Diverticulitis    Diverticulosis    Dyspnea    Dysrhythmia    Family history of adverse reaction to anesthesia    sister has trouble waking up, requires additional oxygen   Fibromyalgia    GERD (gastroesophageal reflux disease)    Headache    High cholesterol    History of kidney stones    Hypertension    Hypothyroid    IBS (irritable bowel syndrome)    Osteopenia    Pneumonia    Restless leg    Sleep apnea    Doesn't use CPAP.     Tachycardia    per pt/fim   Tremor, essential 01/07/2018    Past Surgical History:  Procedure Laterality Date   ABDOMINAL HYSTERECTOMY     APPENDECTOMY     BACK SURGERY     BIOPSY  12/25/2016   Procedure: BIOPSY;  Surgeon: Malissa Hippo, MD;  Location: [facility];  Service: Endoscopy;;  gastric    BIOPSY  10/26/2022   Procedure: BIOPSY;  Surgeon: Dolores Frame, MD;  Location: [facility];  Service: Gastroenterology;;   BREAST LUMPECTOMY Right 03/20/1999   CARDIAC CATHETERIZATION  05/28/2000   CATARACT EXTRACTION Bilateral 03/19/2014   CATARACT EXTRACTION Bilateral    CHOLECYSTECTOMY     COLONOSCOPY N/A 12/17/2012   Procedure: COLONOSCOPY;  Surgeon: Malissa Hippo, MD;  Location: [facility];  Service: Endoscopy;  Laterality: N/A;  215   COLONOSCOPY WITH PROPOFOL N/A 07/24/2019   Procedure: COLONOSCOPY WITH PROPOFOL;  Surgeon: Malissa Hippo, MD;  Location: [facility];   Service: Endoscopy;  Laterality: N/A;  730   complete hysterectomy     DRUG INDUCED ENDOSCOPY N/A 09/02/2020   Procedure: DRUG INDUCED ENDOSCOPY;  Surgeon: Osborn Coho, MD;  Location: [facility];  Service: ENT;  Laterality: N/A;   ESOPHAGEAL DILATION N/A 12/25/2016   Procedure: ESOPHAGEAL DILATION;  Surgeon: Malissa Hippo, MD;  [facility]: [facility];  Service: Endoscopy;  Laterality: N/A;   ESOPHAGEAL DILATION N/A 07/24/2019   Procedure: ESOPHAGEAL DILATION;  Surgeon: Malissa Hippo, MD;  [facility]: [facility];  Service: Endoscopy;  Laterality: N/A;   ESOPHAGEAL DILATION N/A 08/02/2021   Procedure: ESOPHAGEAL DILATION;  Surgeon: Malissa Hippo, MD;  [facility]: [facility];  Service: Endoscopy;  Laterality: N/A;   ESOPHAGOGASTRODUODENOSCOPY N/A 12/25/2016   Procedure: ESOPHAGOGASTRODUODENOSCOPY (EGD);  Surgeon: Malissa Hippo, MD;  [facility];  Service: Endoscopy;  Laterality: N/A;  730   ESOPHAGOGASTRODUODENOSCOPY (EGD) WITH PROPOFOL N/A 07/24/2019   Procedure: ESOPHAGOGASTRODUODENOSCOPY (EGD) WITH PROPOFOL;  Surgeon: Malissa Hippo, MD;  [facility];  Service: Endoscopy;  Laterality: N/A;   ESOPHAGOGASTRODUODENOSCOPY (EGD) WITH PROPOFOL N/A 08/02/2021   Procedure: ESOPHAGOGASTRODUODENOSCOPY (EGD) WITH PROPOFOL;  Surgeon: Malissa Hippo, MD;  [facility];  Service: Endoscopy;  Laterality: N/A;  1245 ASA 2   ESOPHAGOGASTRODUODENOSCOPY (  EGD) WITH PROPOFOL N/A 10/26/2022   Procedure: ESOPHAGOGASTRODUODENOSCOPY (EGD) WITH PROPOFOL;  Surgeon: Dolores Frame, MD;  [facility];  Service: Gastroenterology;  Laterality: N/A;  11:00AM;ASA 3   EXTRACORPOREAL SHOCK WAVE LITHOTRIPSY Left 02/21/2021   Procedure: EXTRACORPOREAL SHOCK WAVE LITHOTRIPSY (ESWL);  Surgeon: Milderd Meager., MD;  Location: [facility];  Service: Urology;  Laterality: Left;   Heel tumor removed     IMPLANTATION OF HYPOGLOSSAL NERVE  STIMULATOR Right 11/25/2020   Procedure: IMPLANTATION OF HYPOGLOSSAL NERVE STIMULATOR;  Surgeon: Osborn Coho, MD;  Location: [facility];  Service: ENT;  Laterality: Right;   POLYPECTOMY  07/24/2019   Procedure: POLYPECTOMY;  Surgeon: Malissa Hippo, MD;  [facility];  Service: Endoscopy;;   rt elbow surgery     SAVORY DILATION  10/26/2022   Procedure: SAVORY DILATION;  Surgeon: Marguerita Merles, Reuel Boom, MD;  Location: [facility];  Service: Gastroenterology;;   THUMB FUSION Bilateral    TONSILLECTOMY     TRIGGER FINGER RELEASE     Right and Left, Dr. Melvyn Novas, 2023/2024    Allergies  Allergen Reactions   Flagyl [Metronidazole] Shortness Of Breath and Swelling   Lyrica [Pregabalin] Shortness Of Breath   Adhesive [Tape] Other (See Comments)    Takes off patient's skin   Aspirin Other (See Comments)    Stomach bleed   Latex Itching and Rash   Phenergan [Promethazine Hcl] Other (See Comments)    Caused grogginess, altered mental status   Prednisone Anxiety   Sulfa Antibiotics Nausea Only   Tetanus Toxoids Swelling    Arm Area   Wound Dressing Adhesive Rash    Social History   Tobacco Use   Smoking status: Never    Passive exposure: Never   Smokeless tobacco: Never  Substance Use Topics   Alcohol use: No    Alcohol/week: 0.0 standard drinks of alcohol    Family History  Problem Relation Age of Onset   Uterine cancer Mother    Parkinson's disease Father    Stroke Father    Colon cancer Brother    Lung cancer Brother    COPD Sister    Diabetes Grandchild    Asthma Son    Prior to Admission medications   Medication Sig Start Date End Date Taking? Authorizing Provider  acetaminophen (TYLENOL) 500 MG tablet Take 1,000 mg by mouth every 6 (six) hours as needed for moderate pain.   Yes [provider]  albuterol (PROVENTIL HFA;VENTOLIN HFA) 108 (90 Base) MCG/ACT inhaler Inhale 2 puffs into the lungs every 6 (six) hours as needed for wheezing or  shortness of breath.   Yes [provider]  aspirin EC 81 MG tablet Take 81 mg by mouth daily. Swallow whole.   Yes [provider]  Biotin w/ Vitamins C & E (HAIR SKIN & NAILS GUMMIES PO) Take 2 tablets by mouth in the morning and at bedtime.   Yes [provider]  Cholecalciferol (VITAMIN D-3) 25 MCG (1000 UT) CAPS Take 1,000 Units by mouth in the morning.   Yes [provider]  Cyanocobalamin (B-12) 500 MCG TABS Take 500 mcg by mouth in the morning.   Yes [provider]  dicyclomine (BENTYL) 10 MG capsule Take 1 capsule (10 mg total) by mouth 3 (three) times daily before meals. 01/09/23  Yes Carlan, Chelsea L, NP  DULoxetine (CYMBALTA) 60 MG capsule Take 60 mg by mouth in the morning.   Yes [provider]  famotidine (PEPCID)  40 MG tablet Take 1 tablet (40 mg total) by mouth at bedtime. 05/15/22  Yes Carlan, Chelsea L, NP  Fluticasone-Salmeterol (ADVAIR) 250-50 MCG/DOSE AEPB Inhale 1 puff into the lungs every 12 (twelve) hours.   Yes [provider]  levothyroxine (SYNTHROID, LEVOTHROID) 75 MCG tablet Take 37.5 mcg by mouth daily before breakfast.   Yes [provider]  lisinopril (PRINIVIL,ZESTRIL) 10 MG tablet Take 10 mg by mouth in the morning.   Yes [provider]  loperamide (IMODIUM A-D) 2 MG tablet Take 2 mg by mouth 4 (four) times daily as needed for diarrhea or loose stools.   Yes [provider]  metFORMIN (GLUCOPHAGE-XR) 500 MG 24 hr tablet Take 500 mg by mouth daily before supper. 01/12/19  Yes [provider]  neomycin-bacitracin-polymyxin (NEOSPORIN) ointment Apply 1 application topically as needed for wound care.   Yes [provider]  omeprazole (PRILOSEC) 40 MG capsule Take 1 capsule (40 mg total) by mouth daily. 05/15/22  Yes Carlan, Chelsea L, NP  ondansetron (ZOFRAN) 4 MG tablet TAKE 1 TABLET BY MOUTH EVERY 8 HOURS AS NEEDED FOR NAUSEA FOR VOMITING 01/09/23  Yes Carlan,  Chelsea L, NP  simvastatin (ZOCOR) 20 MG tablet Take 20 mg by mouth every evening.   Yes [provider]  traMADol (ULTRAM) 50 MG tablet Take 50 mg by mouth every 6 (six) hours as needed for severe pain (pain score 7-10). 11/10/22  Yes [provider]  traZODone (DESYREL) 100 MG tablet Take 50 mg by mouth at bedtime.   Yes [provider]  Vitamin E 670 MG (1000 UT) CAPS Take 1,000 Units by mouth in the morning.   Yes [provider]  docusate sodium (COLACE) 100 MG capsule Take 2 capsules (200 mg total) by mouth at bedtime. Patient taking differently: Take 200 mg by mouth at bedtime as needed for moderate constipation. 01/04/15   Malissa Hippo, MD  iron polysaccharides (NIFEREX) 150 MG capsule Take 1 capsule (150 mg total) by mouth daily. Patient not taking: Reported on 01/24/2023 10/08/22   Raquel James, NP     Review of Systems  Positive ROS: As above  All other systems have been reviewed and were otherwise negative with the exception of those mentioned in the HPI and as above.  Objective: Vital signs in last 24 hours: Temp:  [97.9 F (36.6 C)] 97.9 F (36.6 C) (11/14 0604) Pulse Rate:  [66] 66 (11/14 0604) Resp:  [17] 17 (11/14 0604) BP: (136)/(65) 136/65 (11/14 0604) SpO2:  [98 %] 98 % (11/14 0604) Weight:  [54.4 kg] 54.4 kg (11/14 0604) Estimated body mass index is 22.31 kg/m as calculated from the following:   Height as of this encounter: 5' 1.5" (1.562 m).   Weight as of this encounter: 54.4 kg.   General Appearance: Alert Head: Normocephalic, without obvious abnormality, atraumatic Eyes: PERRL, conjunctiva/corneas clear, EOM's intact,    Ears: Normal  Throat: Normal  Neck: Limited cervical range of motion.  Spurling's testing is positive bilaterally. Back: unremarkable Lungs: Clear to auscultation bilaterally, respirations unlabored Heart: Regular rate and rhythm, no murmur, rub or gallop Abdomen: Soft,  non-tender Extremities: Extremities normal, atraumatic, no cyanosis or edema Skin: unremarkable  NEUROLOGIC:   Mental status: alert and oriented,Motor Exam - grossly normal Sensory Exam - grossly normal Reflexes:  Coordination - grossly normal Gait - grossly normal Balance - grossly normal Cranial Nerves: I: smell Not tested  II: visual acuity  OS: Normal  OD: Normal  II: visual fields Full to confrontation  II: pupils Equal, round, reactive to light  III,VII: ptosis None  III,IV,VI: extraocular muscles  Full ROM  V: mastication Normal  V: facial light touch sensation  Normal  V,VII: corneal reflex  Present  VII: facial muscle function - upper  Normal  VII: facial muscle function - lower Normal  VIII: hearing Not tested  IX: soft palate elevation  Normal  IX,X: gag reflex Present  XI: trapezius strength  5/5  XI: sternocleidomastoid strength 5/5  XI: neck flexion strength  5/5  XII: tongue strength  Normal    Data Review Lab Results  Component Value Date   WBC 6.4 01/25/2023   HGB 10.6 (L) 01/25/2023   HCT 32.7 (L) 01/25/2023   MCV 98.5 01/25/2023   PLT 257 01/25/2023   Lab Results  Component Value Date   NA 140 01/25/2023   K 4.6 01/25/2023   CL 106 01/25/2023   CO2 25 01/25/2023   BUN 11 01/25/2023   CREATININE 1.37 (H) 01/25/2023   GLUCOSE 91 01/25/2023   Lab Results  Component Value Date   INR 1.3 (H) 01/12/2021    Assessment/Plan: Cervical spondylosis, cervical stenosis, cervical radiculopathy, cervical myelopathy, cervicalgia: I discussed the situation with the patient and her husband.  I reviewed her imaging studies with him and pointed out the abnormalities.  We have discussed the various treatment options including surgery.  I described the surgical treatment option of a C3-4, C4-5 C5-6 and C6-7 anterior cervicectomy fusion and plating.  I have shown her surgical models.  I have given her surgical pamphlet.  We have discussed the risk, benefits,  alternatives, expected postop course, and likelihood of achieving our goals with surgery.  I have answered all her questions.  She has decided proceed with surgery.   Cristi Loron 01/31/2023 7:29 AM

## 2023-01-31 NOTE — Progress Notes (Signed)
Orthopedic Tech Progress Note Patient Details:  Heather Espinoza 1945-11-22 [id number] Aspen collar delivered to PACU Patient ID: ASIJAH HLAVATY, female   DOB: 03-11-1946, 77 y.o.   MRN: [id number]  Heather Espinoza 01/31/2023, 1:18 PM

## 2023-01-31 NOTE — Anesthesia Procedure Notes (Signed)
Procedure Name: Intubation Date/Time: 01/31/2023 8:07 AM  Performed by: Hessie Diener, CRNAPre-anesthesia Checklist: Patient identified, Emergency Drugs available, Suction available and Patient being monitored Patient Re-evaluated:Patient Re-evaluated prior to induction Oxygen Delivery Method: Circle System Utilized Preoxygenation: Pre-oxygenation with 100% oxygen Induction Type: IV induction Ventilation: Mask ventilation without difficulty Laryngoscope Size: Glidescope and 3 Grade View: Grade I Tube type: Oral Tube size: 7.0 mm Number of attempts: 1 Airway Equipment and Method: Stylet and Oral airway Placement Confirmation: ETT inserted through vocal cords under direct vision, positive ETCO2 and breath sounds checked- equal and bilateral Tube secured with: Tape Dental Injury: Teeth and Oropharynx as per pre-operative assessment

## 2023-01-31 NOTE — Op Note (Signed)
Brief history: The patient is a 77 year old white female who is complaining of neck and arm pain consistent with a cervical radiculopathy.  She failed medical management.  She was worked up with cervical x-rays, cervical MRI and cervical myelo CT which demonstrated spondylosis and foraminal stenosis most prominent at C3-4, C4-5, C5-6 and C6-7.  I discussed the various treatment options with her.  She has decided proceed with surgery.  Preoperative diagnosis: C3-4, C4-5, C5-6 and C6-7 spondylosis, cervical radiculopathy, cervicalgia  Postoperative diagnosis: The same  Procedure: C3-4, C4-5, C5-6 and C6-7 anterior cervical discectomy/decompression; C3-4, C4-5, C5-6 and C6-7 interbody arthrodesis with local morcellized autograft bone and Zimmer DBM; insertion of interbody prosthesis at C3-4, C4-5, C5-6 and C6-7 (Zimmer peek interbody prosthesis); anterior cervical plating from C3-C7 with globus titanium plate  Surgeon: Dr. Delma Officer  Asst.: Dr. Lisbeth Renshaw  Anesthesia: Gen. endotracheal  Estimated blood loss: 150 cc  Drains: Jackson-Pratt drain  Complications: None  Description of procedure: The patient was brought to the operating room by the anesthesia team. General endotracheal anesthesia was induced. A roll was placed under the patient's shoulders to keep the neck in the neutral position. The patient's anterior cervical region was then prepared with Betadine scrub and Betadine solution. Sterile drapes were applied.  The area to be incised was then injected with Marcaine with epinephrine solution. I then used a scalpel to make a transverse incision in the patient's left anterior neck. I used the Metzenbaum scissors to divide the platysmal muscle and then to dissect medial to the sternocleidomastoid muscle, jugular vein, and carotid artery. I carefully dissected down towards the anterior cervical spine identifying the esophagus and retracting it medially. Then using Kitner swabs to  clear soft tissue from the anterior cervical spine. We then inserted a bent spinal needle into the upper exposed intervertebral disc space. We then obtained intraoperative radiographs confirm our location.  I then used electrocautery to detach the medial border of the longus colli muscle bilaterally from the C3-4, C4-5, C5-6 and C6-7 intervertebral disc spaces. I then inserted the Caspar self-retaining retractor underneath the longus colli muscle bilaterally to provide exposure.  We then incised the intervertebral disc at C3-4. We then performed a partial intervertebral discectomy with a pituitary forceps and the Karlin curettes. I then inserted distraction screws into the vertebral bodies at C3 and C4. We then distracted the interspace. We then used the high-speed drill to decorticate the vertebral endplates at C3-4, to drill away the remainder of the intervertebral disc, to drill away some posterior spondylosis, and to thin out the posterior longitudinal ligament. I then incised ligament with the arachnoid knife. We then removed the ligament with a Kerrison punches undercutting the vertebral endplates and decompressing the thecal sac. We then performed foraminotomies about the bilateral C4 nerve roots. This completed the decompression at this level.  We then repeated this procedure in analogous fashion at C4-5, C5-6 and C6-7 decompressing the thecal sac and the bilateral C5, C6 and C7 nerve roots.  We now turned our to attention to the interbody fusion. We used the trial spacers to determine the appropriate size for the interbody prosthesis. We then pre-filled prosthesis with a combination of local morcellized autograft bone that we obtained during decompression as well as Zimmer DBM. We then inserted the prosthesis into the distracted interspace at C3-4, C4-5, C5-6 and C6-7. We then removed the distraction screws. There was a good snug fit of the prosthesis in the interspace.  Having completed the  fusion  we now turned attention to the anterior spinal instrumentation. We used the high-speed drill to drill away some anterior spondylosis at the disc spaces so that the plate lay down flat. We selected the appropriate length titanium anterior cervical plate. We laid it along the anterior aspect of the vertebral bodies from C3-C7. We then drilled 12 mm holes at C3, C4, C5, C6 and C7. We then secured the plate to the vertebral bodies by placing two 12 mm self-tapping screws at C3, C4, C5, C6 and C7. We then obtained intraoperative radiograph. The demonstrating good position of the upper instrumentation.  We could not see the low instrumentation well but did look good in vivo.  We therefore secured the screws the plate the locking each cam. This completed the instrumentation.  We then obtained hemostasis using bipolar electrocautery. We irrigated the wound out with saline solution. We then removed the retractor. We inspected the esophagus for any damage. There was none apparent.  We placed a 10 mm flat Jackson-Pratt drain in the prevertebral space and tunneled it out through a separate stab wound.  We then reapproximated patient's platysmal muscle with interrupted 3-0 Vicryl suture. We then reapproximated the subcutaneous tissue with interrupted 3-0 Vicryl suture. The skin was reapproximated with Steri-Strips and benzoin. The wound was then covered with bacitracin ointment. A sterile dressing was applied. The drapes were removed. Patient was subsequently extubated by the anesthesia team and transported to the post anesthesia care unit in stable condition. All sponge instrument and needle counts were reportedly correct at the end of this case.

## 2023-01-31 NOTE — Transfer of Care (Signed)
Immediate Anesthesia Transfer of Care Note  Patient: Heather Espinoza  Procedure(s) Performed: ACDF,IP,PLATE/SCREWS A21, C45, C56, C67  Patient Location: PACU  Anesthesia Type:General  Level of Consciousness: drowsy  Airway & Oxygen Therapy: Patient connected to face mask oxygen  Post-op Assessment: Post -op Vital signs reviewed and stable  Post vital signs: stable  Last Vitals:  Vitals Value Taken Time  BP 127/68 01/31/23 1230  Temp    Pulse 94 01/31/23 1232  Resp 18 01/31/23 1232  SpO2 99 % 01/31/23 1232  Vitals shown include unfiled device data.  Last Pain:  Vitals:   01/31/23 0629  TempSrc:   PainSc: 5       Patients Stated Pain Goal: 1 (01/31/23 3086)  Complications: There were no known notable events for this encounter.

## 2023-02-01 LAB — GLUCOSE, CAPILLARY
Glucose-Capillary: 150 mg/dL — ABNORMAL HIGH (ref 70–99)
Glucose-Capillary: 133 mg/dL — ABNORMAL HIGH (ref 70–99)

## 2023-02-01 MED ORDER — DOCUSATE SODIUM 100 MG PO CAPS: 100.0000 mg | ORAL_CAPSULE | Freq: Two times a day (BID) | ORAL | 0 refills | Status: DC

## 2023-02-01 MED ORDER — OXYCODONE-ACETAMINOPHEN 5-325 MG PO TABS: 1.0000 | ORAL_TABLET | ORAL | 0 refills | Status: DC | PRN

## 2023-02-01 MED ORDER — OXYCODONE-ACETAMINOPHEN 5-325 MG PO TABS
1.0000 | ORAL_TABLET | ORAL | Status: DC | PRN
  Administered 2023-02-01: 1 via ORAL
  Filled 2023-02-01: qty 1

## 2023-02-01 MED ORDER — CYCLOBENZAPRINE HCL 5 MG PO TABS: 5.0000 mg | ORAL_TABLET | Freq: Three times a day (TID) | ORAL | Status: DC | PRN

## 2023-02-01 MED ORDER — CYCLOBENZAPRINE HCL 5 MG PO TABS: 5.0000 mg | ORAL_TABLET | Freq: Three times a day (TID) | ORAL | 0 refills | Status: AC | PRN

## 2023-02-01 MED FILL — Thrombin For Soln 5000 Unit: CUTANEOUS | Qty: 5000 | Status: AC

## 2023-02-01 NOTE — Plan of Care (Signed)
  Problem: Education: Goal: Knowledge of General Education information will improve Description: Including pain rating scale, medication(s)/side effects and non-pharmacologic comfort measures Outcome: Completed/Met   Problem: Health Behavior/Discharge Planning: Goal: Ability to manage health-related needs will improve Outcome: Completed/Met   Problem: Clinical Measurements: Goal: Ability to maintain clinical measurements within normal limits will improve Outcome: Completed/Met Goal: Will remain free from infection Outcome: Completed/Met Goal: Diagnostic test results will improve Outcome: Completed/Met Goal: Respiratory complications will improve Outcome: Completed/Met Goal: Cardiovascular complication will be avoided Outcome: Completed/Met   Problem: Nutrition: Goal: Adequate nutrition will be maintained Outcome: Completed/Met   Problem: Coping: Goal: Level of anxiety will decrease Outcome: Completed/Met   Problem: Elimination: Goal: Will not experience complications related to bowel motility Outcome: Completed/Met Goal: Will not experience complications related to urinary retention Outcome: Completed/Met   Problem: Pain Management: Goal: General experience of comfort will improve Outcome: Completed/Met   Problem: Safety: Goal: Ability to remain free from injury will improve Outcome: Completed/Met   Problem: Skin Integrity: Goal: Risk for impaired skin integrity will decrease Outcome: Completed/Met   Problem: Education: Goal: Ability to verbalize activity precautions or restrictions will improve Outcome: Completed/Met Goal: Knowledge of the prescribed therapeutic regimen will improve Outcome: Completed/Met Goal: Understanding of discharge needs will improve Outcome: Completed/Met   Problem: Activity: Goal: Ability to avoid complications of mobility impairment will improve Outcome: Completed/Met Goal: Ability to tolerate increased activity will  improve Outcome: Completed/Met Goal: Will remain free from falls Outcome: Completed/Met   Problem: Bowel/Gastric: Goal: Gastrointestinal status for postoperative course will improve Outcome: Completed/Met   Problem: Clinical Measurements: Goal: Ability to maintain clinical measurements within normal limits will improve Outcome: Completed/Met Goal: Postoperative complications will be avoided or minimized Outcome: Completed/Met Goal: Diagnostic test results will improve Outcome: Completed/Met   Problem: Pain Management: Goal: Pain level will decrease Outcome: Completed/Met   Problem: Skin Integrity: Goal: Will show signs of wound healing Outcome: Completed/Met   Problem: Health Behavior/Discharge Planning: Goal: Identification of resources available to assist in meeting health care needs will improve Outcome: Completed/Met   Problem: Bladder/Genitourinary: Goal: Urinary functional status for postoperative course will improve Outcome: Completed/Met

## 2023-02-01 NOTE — Anesthesia Postprocedure Evaluation (Signed)
Anesthesia Post Note  Patient: YAMILY VOLLMER  Procedure(s) Performed: ACDF,IP,PLATE/SCREWS W41, C45, C56, C67     Patient location during evaluation: PACU Anesthesia Type: General Level of consciousness: awake and alert Pain management: pain level controlled Vital Signs Assessment: post-procedure vital signs reviewed and stable Respiratory status: spontaneous breathing, nonlabored ventilation, respiratory function stable and patient connected to nasal cannula oxygen Cardiovascular status: blood pressure returned to baseline and stable Postop Assessment: no apparent nausea or vomiting Anesthetic complications: no   There were no known notable events for this encounter.  Last Vitals:  Vitals:   02/01/23 0524 02/01/23 0718  BP: (!) 121/57 (!) 129/50  Pulse: 83 75  Resp: 20 19  Temp: 37.2 C 36.6 C  SpO2: 97% 100%    Last Pain:  Vitals:   02/01/23 0718  TempSrc: Oral  PainSc:                  Nelle Don Casen Pryor

## 2023-02-01 NOTE — Discharge Summary (Signed)
Physician Discharge Summary  Patient ID: Heather Espinoza MRN: [id number] DOB/AGE: Nov 09, 1945 77 y.o.  Admit date: 01/31/2023 Discharge date: 02/01/2023  Admission Diagnoses: Cervical spondylosis, cervical radiculopathy, cervicalgia  Discharge Diagnoses: The same Principal Problem:   Cervical spondylosis with radiculopathy   Discharged Condition: good  Hospital Course: I performed a C3-4, C4-5, C5-6 and C6-7 anterior cervicectomy fusion and plating on the patient on 01/31/2023.  The surgery went well.  The patient's postoperative course was unremarkable.  On postoperative day #1 she requested discharge to home.  She was given verbal and written discharge instructions.  All her questions were answered.  Consults: OT, care management Significant Diagnostic Studies: None Treatments: C3-4, C4-5, C5-6 and C6-7 anterior cervical discectomy fusion and plating. Discharge Exam: Blood pressure (!) 129/50, pulse 75, temperature 97.9 F (36.6 C), temperature source Oral, resp. rate 19, height 5' 1.5" (1.562 m), weight 54.4 kg, SpO2 100%. The patient is alert and pleasant.  She looks well.  Her dressing is clean and dry.  There is no hematoma or shift.  Her strength is normal.  Disposition: Home  Discharge Instructions     Call MD for:  difficulty breathing, headache or visual disturbances   Complete by: As directed    Call MD for:  extreme fatigue   Complete by: As directed    Call MD for:  hives   Complete by: As directed    Call MD for:  persistant dizziness or light-headedness   Complete by: As directed    Call MD for:  persistant nausea and vomiting   Complete by: As directed    Call MD for:  redness, tenderness, or signs of infection (pain, swelling, redness, odor or green/yellow discharge around incision site)   Complete by: As directed    Call MD for:  severe uncontrolled pain   Complete by: As directed    Call MD for:  temperature >100.4   Complete by: As directed    Diet -  low sodium heart healthy   Complete by: As directed    Discharge instructions   Complete by: As directed    Call [phone] for a followup appointment. Take a stool softener while you are using pain medications.   Driving Restrictions   Complete by: As directed    Do not drive for 2 weeks.   Increase activity slowly   Complete by: As directed    Lifting restrictions   Complete by: As directed    Do not lift more than 5 pounds. No excessive bending or twisting.   May shower / Bathe   Complete by: As directed    Remove the dressing for 3 days after surgery.  You may shower, but leave the incision alone.   Remove dressing in 48 hours   Complete by: As directed       Allergies as of 02/01/2023       Reactions   Flagyl [metronidazole] Shortness Of Breath, Swelling   Lyrica [pregabalin] Shortness Of Breath   Adhesive [tape] Other (See Comments)   Takes off patient's skin   Aspirin Other (See Comments)   Stomach bleed   Latex Itching, Rash   Phenergan [promethazine Hcl] Other (See Comments)   Caused grogginess, altered mental status   Prednisone Anxiety   Sulfa Antibiotics Nausea Only   Tetanus Toxoids Swelling   Arm Area   Wound Dressing Adhesive Rash        Medication List     STOP taking these medications  acetaminophen 500 MG tablet Commonly known as: TYLENOL       TAKE these medications    albuterol 108 (90 Base) MCG/ACT inhaler Commonly known as: VENTOLIN HFA Inhale 2 puffs into the lungs every 6 (six) hours as needed for wheezing or shortness of breath.   aspirin EC 81 MG tablet Take 81 mg by mouth daily. Swallow whole.   B-12 500 MCG Tabs Take 500 mcg by mouth in the morning.   cyclobenzaprine 5 MG tablet Commonly known as: FLEXERIL Take 1 tablet (5 mg total) by mouth 3 (three) times daily as needed for muscle spasms.   dicyclomine 10 MG capsule Commonly known as: BENTYL Take 1 capsule (10 mg total) by mouth 3 (three) times daily before  meals.   docusate sodium 100 MG capsule Commonly known as: Colace Take 2 capsules (200 mg total) by mouth at bedtime. What changed:  when to take this reasons to take this   docusate sodium 100 MG capsule Commonly known as: COLACE Take 1 capsule (100 mg total) by mouth 2 (two) times daily. What changed: You were already taking a medication with the same name, and this prescription was added. Make sure you understand how and when to take each.   DULoxetine 60 MG capsule Commonly known as: CYMBALTA Take 60 mg by mouth in the morning.   famotidine 40 MG tablet Commonly known as: PEPCID Take 1 tablet (40 mg total) by mouth at bedtime.   Fluticasone-Salmeterol 250-50 MCG/DOSE Aepb Commonly known as: ADVAIR Inhale 1 puff into the lungs every 12 (twelve) hours.   HAIR SKIN & NAILS GUMMIES PO Take 2 tablets by mouth in the morning and at bedtime.   iron polysaccharides 150 MG capsule Commonly known as: NIFEREX Take 1 capsule (150 mg total) by mouth daily.   levothyroxine 75 MCG tablet Commonly known as: SYNTHROID Take 37.5 mcg by mouth daily before breakfast.   lisinopril 10 MG tablet Commonly known as: ZESTRIL Take 10 mg by mouth in the morning.   loperamide 2 MG tablet Commonly known as: IMODIUM A-D Take 2 mg by mouth 4 (four) times daily as needed for diarrhea or loose stools.   metFORMIN 500 MG 24 hr tablet Commonly known as: GLUCOPHAGE-XR Take 500 mg by mouth daily before supper.   neomycin-bacitracin-polymyxin [id number] ointment Commonly known as: NEOSPORIN Apply 1 application topically as needed for wound care.   omeprazole 40 MG capsule Commonly known as: PRILOSEC Take 1 capsule (40 mg total) by mouth daily.   ondansetron 4 MG tablet Commonly known as: ZOFRAN TAKE 1 TABLET BY MOUTH EVERY 8 HOURS AS NEEDED FOR NAUSEA FOR VOMITING   oxyCODONE-acetaminophen 5-325 MG tablet Commonly known as: PERCOCET/ROXICET Take 1 tablet by mouth every 4 (four) hours as  needed for moderate pain (pain score 4-6).   simvastatin 20 MG tablet Commonly known as: ZOCOR Take 20 mg by mouth every evening.   traMADol 50 MG tablet Commonly known as: ULTRAM Take 50 mg by mouth every 6 (six) hours as needed for severe pain (pain score 7-10).   traZODone 100 MG tablet Commonly known as: DESYREL Take 50 mg by mouth at bedtime.   Vitamin D-3 25 MCG (1000 UT) Caps Take 1,000 Units by mouth in the morning.   Vitamin E 670 MG (1000 UT) Caps Take 1,000 Units by mouth in the morning.         Signed: Cristi Loron 02/01/2023, 7:43 AM

## 2023-02-01 NOTE — Evaluation (Signed)
Occupational Therapy Evaluation Patient Details Name: Heather Espinoza MRN: [id number] DOB: 06/07/1945 Today's Date: 02/01/2023   History of Present Illness Heather Espinoza is a 77 yo female who underwent ACDF,IP,PLATE/SCREWS Q65, C45, C56, C67 11/14. PMHx: anemia, arthritis, depression, DM, dyspnea, fibromyalgia, GERM high cholesterol, HTN, hypothyroid, IBS, osteopenia, PNA, tachycardia   Clinical Impression   Heather Espinoza was evaluated s/p the above spine surgery. She is indep at baseline. Upon evaluation pt was limited by surgical pain, cervical precautions, generalized weakness and decreased activity tolerance. Overall she demonstrated mod I ability to complete ADLs and mobility with and without AD after education. Provided cues and education on spinal precautions and compensatory techniques throughout, handout provided and pt demonstrated great recall during ADLs and mobility. Pt does not require further acute OT services. Recommend d/c home with support of family.         If plan is discharge home, recommend the following: Assistance with cooking/housework;Assist for transportation    Functional Status Assessment  Patient has had a recent decline in their functional status and demonstrates the ability to make significant improvements in function in a reasonable and predictable amount of time.   Equipment Recommendations  None recommended by OT       Precautions / Restrictions Precautions Precautions: Fall;Cervical Precaution Booklet Issued: Yes (comment) Required Braces or Orthoses: Cervical Brace Cervical Brace: Hard collar;At all times Restrictions Weight Bearing Restrictions: No      Mobility Bed Mobility Overal bed mobility: Needs Assistance Bed Mobility: Rolling, Sidelying to Sit Rolling: Modified independent (Device/Increase time)         General bed mobility comments: good log roll, has asjustable bed at home    Transfers Overall transfer level: Needs  assistance Equipment used: Rolling walker (2 wheels), None Transfers: Sit to/from Stand Sit to Stand: Modified independent (Device/Increase time)           General transfer comment: Mod I with and without RW      Balance Overall balance assessment: Needs assistance Sitting-balance support: Feet supported Sitting balance-Leahy Scale: Good     Standing balance support: No upper extremity supported, During functional activity Standing balance-Leahy Scale: Fair           ADL either performed or assessed with clinical judgement   ADL Overall ADL's : Modified independent             General ADL Comments: mod I for ADLs after review of spinal precautions and compensatory techniques     Vision Baseline Vision/History: 0 No visual deficits Vision Assessment?: No apparent visual deficits     Perception Perception: Within Functional Limits       Praxis Praxis: WFL       Pertinent Vitals/Pain Pain Assessment Pain Assessment: Faces Faces Pain Scale: Hurts little more Pain Location: neck Pain Descriptors / Indicators: Discomfort Pain Intervention(s): Monitored during session, Limited activity within patient's tolerance     Extremity/Trunk Assessment Upper Extremity Assessment Upper Extremity Assessment: Overall WFL for tasks assessed   Lower Extremity Assessment Lower Extremity Assessment: Generalized weakness   Cervical / Trunk Assessment Cervical / Trunk Assessment: Neck Surgery   Communication Communication Communication: No apparent difficulties   Cognition Arousal: Alert Behavior During Therapy: WFL for tasks assessed/performed, Flat affect Overall Cognitive Status: Within Functional Limits for tasks assessed           General Comments  VSS on RA     Home Living Family/patient expects to be discharged to:: Private residence Living Arrangements: Spouse/significant other Available  Help at Discharge: Family;Available 24 hours/day Type of Home:  House Home Access: Stairs to enter [employer] of Steps: 8 Entrance Stairs-Rails: Can reach both Home Layout: One level     Bathroom Shower/Tub: Producer, television/film/video: Standard     Home Equipment: Agricultural consultant (2 wheels);Rollator (4 wheels);Cane - single point;Shower seat          Prior Functioning/Environment Prior Level of Function : Independent/Modified Independent;Driving             Mobility Comments: no AD ADLs Comments: indep        OT Problem List: Decreased knowledge of precautions         OT Goals(Current goals can be found in the care plan section) Acute Rehab OT Goals Patient Stated Goal: home OT Goal Formulation: With patient Time For Goal Achievement: 02/01/23 Potential to Achieve Goals: Good   AM-PAC OT "6 Clicks" Daily Activity     Outcome Measure Help from another person eating meals?: None Help from another person taking care of personal grooming?: None Help from another person toileting, which includes using toliet, bedpan, or urinal?: None Help from another person bathing (including washing, rinsing, drying)?: None Help from another person to put on and taking off regular upper body clothing?: None Help from another person to put on and taking off regular lower body clothing?: None 6 Click Score: 24   End of Session Equipment Utilized During Treatment: Cervical collar Nurse Communication: Mobility status  Activity Tolerance: Patient tolerated treatment well Patient left: in chair;with call bell/phone within reach  OT Visit Diagnosis: Muscle weakness (generalized) (M62.81)                Time: 1610-9604 OT Time Calculation (min): 14 min Charges:  OT General Charges $OT Visit: 1 Visit OT Evaluation $OT Eval Low Complexity: 1 Low  Derenda Mis, OTR/L Acute Rehabilitation Services Office [phone] Secure Chat Communication Preferred   Donia Pounds 02/01/2023, 9:59 AM

## 2023-02-01 NOTE — Progress Notes (Signed)
Patient alert and oriented, void, ambulate. Surgical site clean and dry no sign of infection. D/c instructions explain and given all questions answered, patient and family verbalized understanding. Patient d/c home per order.

## 2023-02-06 ENCOUNTER — Encounter (HOSPITAL_COMMUNITY): Payer: Self-pay | Admitting: Neurosurgery

## 2023-02-07 ENCOUNTER — Ambulatory Visit (INDEPENDENT_AMBULATORY_CARE_PROVIDER_SITE_OTHER): Payer: Medicare Other | Admitting: Gastroenterology

## 2023-02-08 ENCOUNTER — Other Ambulatory Visit (INDEPENDENT_AMBULATORY_CARE_PROVIDER_SITE_OTHER): Payer: Self-pay | Admitting: Gastroenterology

## 2023-02-25 ENCOUNTER — Other Ambulatory Visit (INDEPENDENT_AMBULATORY_CARE_PROVIDER_SITE_OTHER): Payer: Self-pay | Admitting: Gastroenterology

## 2023-02-25 NOTE — Telephone Encounter (Signed)
Already fill on 02/11/2023

## 2023-03-06 ENCOUNTER — Other Ambulatory Visit (INDEPENDENT_AMBULATORY_CARE_PROVIDER_SITE_OTHER): Payer: Self-pay | Admitting: Gastroenterology

## 2023-03-07 NOTE — Telephone Encounter (Signed)
 Last seen:  10/08/22

## 2023-03-28 ENCOUNTER — Other Ambulatory Visit (INDEPENDENT_AMBULATORY_CARE_PROVIDER_SITE_OTHER): Payer: Self-pay | Admitting: Gastroenterology

## 2023-04-04 ENCOUNTER — Other Ambulatory Visit (INDEPENDENT_AMBULATORY_CARE_PROVIDER_SITE_OTHER): Payer: Self-pay | Admitting: Gastroenterology

## 2023-04-29 ENCOUNTER — Other Ambulatory Visit (INDEPENDENT_AMBULATORY_CARE_PROVIDER_SITE_OTHER): Payer: Self-pay | Admitting: Gastroenterology

## 2023-05-13 ENCOUNTER — Other Ambulatory Visit (INDEPENDENT_AMBULATORY_CARE_PROVIDER_SITE_OTHER): Payer: Self-pay | Admitting: Gastroenterology

## 2023-05-28 ENCOUNTER — Other Ambulatory Visit (INDEPENDENT_AMBULATORY_CARE_PROVIDER_SITE_OTHER): Payer: Self-pay | Admitting: Gastroenterology

## 2023-05-29 NOTE — Telephone Encounter (Signed)
 Last OV 10/08/22

## 2023-06-13 ENCOUNTER — Other Ambulatory Visit (INDEPENDENT_AMBULATORY_CARE_PROVIDER_SITE_OTHER): Payer: Self-pay | Admitting: Gastroenterology

## 2023-06-13 NOTE — Telephone Encounter (Signed)
 Last seen:  10/08/22

## 2023-07-04 ENCOUNTER — Other Ambulatory Visit (INDEPENDENT_AMBULATORY_CARE_PROVIDER_SITE_OTHER): Payer: Self-pay | Admitting: Gastroenterology

## 2023-07-16 ENCOUNTER — Other Ambulatory Visit (INDEPENDENT_AMBULATORY_CARE_PROVIDER_SITE_OTHER): Payer: Self-pay | Admitting: Gastroenterology

## 2023-07-26 ENCOUNTER — Other Ambulatory Visit (INDEPENDENT_AMBULATORY_CARE_PROVIDER_SITE_OTHER): Payer: Self-pay | Admitting: Gastroenterology

## 2023-08-05 ENCOUNTER — Other Ambulatory Visit (INDEPENDENT_AMBULATORY_CARE_PROVIDER_SITE_OTHER): Payer: Self-pay | Admitting: Gastroenterology

## 2023-08-08 ENCOUNTER — Other Ambulatory Visit (INDEPENDENT_AMBULATORY_CARE_PROVIDER_SITE_OTHER): Payer: Self-pay | Admitting: Gastroenterology

## 2023-08-09 ENCOUNTER — Other Ambulatory Visit (INDEPENDENT_AMBULATORY_CARE_PROVIDER_SITE_OTHER): Payer: Self-pay | Admitting: Gastroenterology

## 2023-08-28 ENCOUNTER — Other Ambulatory Visit (INDEPENDENT_AMBULATORY_CARE_PROVIDER_SITE_OTHER): Payer: Self-pay | Admitting: Gastroenterology

## 2023-08-28 DIAGNOSIS — K582 Mixed irritable bowel syndrome: Secondary | ICD-10-CM

## 2023-09-14 ENCOUNTER — Other Ambulatory Visit (INDEPENDENT_AMBULATORY_CARE_PROVIDER_SITE_OTHER): Payer: Self-pay | Admitting: Gastroenterology

## 2023-09-16 NOTE — Telephone Encounter (Signed)
Needs office visit for further refills

## 2023-10-08 ENCOUNTER — Other Ambulatory Visit (INDEPENDENT_AMBULATORY_CARE_PROVIDER_SITE_OTHER): Payer: Self-pay | Admitting: Gastroenterology

## 2023-10-20 ENCOUNTER — Emergency Department (HOSPITAL_COMMUNITY)

## 2023-10-20 ENCOUNTER — Other Ambulatory Visit: Payer: Self-pay

## 2023-10-20 ENCOUNTER — Inpatient Hospital Stay (HOSPITAL_COMMUNITY)
Admission: EM | Admit: 2023-10-20 | Discharge: 2023-10-22 | DRG: 872 | Disposition: A | Discharge status: Home / Self Care | Attending: Internal Medicine | Admitting: Internal Medicine

## 2023-10-20 DIAGNOSIS — E7800 Pure hypercholesterolemia, unspecified: Secondary | ICD-10-CM | POA: Diagnosis present

## 2023-10-20 DIAGNOSIS — F32A Depression, unspecified: Secondary | ICD-10-CM | POA: Diagnosis present

## 2023-10-20 DIAGNOSIS — K219 Gastro-esophageal reflux disease without esophagitis: Secondary | ICD-10-CM | POA: Diagnosis present

## 2023-10-20 DIAGNOSIS — Z886 Allergy status to analgesic agent status: Secondary | ICD-10-CM | POA: Diagnosis not present

## 2023-10-20 DIAGNOSIS — J449 Chronic obstructive pulmonary disease, unspecified: Secondary | ICD-10-CM | POA: Diagnosis present

## 2023-10-20 DIAGNOSIS — Z91048 Other nonmedicinal substance allergy status: Secondary | ICD-10-CM | POA: Diagnosis not present

## 2023-10-20 DIAGNOSIS — Z7951 Long term (current) use of inhaled steroids: Secondary | ICD-10-CM | POA: Diagnosis not present

## 2023-10-20 DIAGNOSIS — Z79899 Other long term (current) drug therapy: Secondary | ICD-10-CM

## 2023-10-20 DIAGNOSIS — Z801 Family history of malignant neoplasm of trachea, bronchus and lung: Secondary | ICD-10-CM

## 2023-10-20 DIAGNOSIS — Z1152 Encounter for screening for COVID-19: Secondary | ICD-10-CM

## 2023-10-20 DIAGNOSIS — Z8049 Family history of malignant neoplasm of other genital organs: Secondary | ICD-10-CM

## 2023-10-20 DIAGNOSIS — A4151 Sepsis due to Escherichia coli [E. coli]: Secondary | ICD-10-CM | POA: Diagnosis present

## 2023-10-20 DIAGNOSIS — Z882 Allergy status to sulfonamides status: Secondary | ICD-10-CM

## 2023-10-20 DIAGNOSIS — I4891 Unspecified atrial fibrillation: Secondary | ICD-10-CM | POA: Diagnosis present

## 2023-10-20 DIAGNOSIS — I10 Essential (primary) hypertension: Secondary | ICD-10-CM | POA: Diagnosis present

## 2023-10-20 DIAGNOSIS — Z800 Family history of malignant neoplasm of digestive organs: Secondary | ICD-10-CM

## 2023-10-20 DIAGNOSIS — D509 Iron deficiency anemia, unspecified: Secondary | ICD-10-CM | POA: Diagnosis present

## 2023-10-20 DIAGNOSIS — E039 Hypothyroidism, unspecified: Secondary | ICD-10-CM | POA: Diagnosis present

## 2023-10-20 DIAGNOSIS — N390 Urinary tract infection, site not specified: Secondary | ICD-10-CM | POA: Diagnosis present

## 2023-10-20 DIAGNOSIS — A419 Sepsis, unspecified organism: Principal | ICD-10-CM

## 2023-10-20 DIAGNOSIS — Z7982 Long term (current) use of aspirin: Secondary | ICD-10-CM | POA: Diagnosis not present

## 2023-10-20 DIAGNOSIS — M797 Fibromyalgia: Secondary | ICD-10-CM | POA: Diagnosis present

## 2023-10-20 DIAGNOSIS — G250 Essential tremor: Secondary | ICD-10-CM | POA: Diagnosis present

## 2023-10-20 DIAGNOSIS — Z91040 Latex allergy status: Secondary | ICD-10-CM

## 2023-10-20 DIAGNOSIS — E1142 Type 2 diabetes mellitus with diabetic polyneuropathy: Secondary | ICD-10-CM | POA: Diagnosis present

## 2023-10-20 DIAGNOSIS — E785 Hyperlipidemia, unspecified: Secondary | ICD-10-CM | POA: Diagnosis not present

## 2023-10-20 DIAGNOSIS — Z888 Allergy status to other drugs, medicaments and biological substances status: Secondary | ICD-10-CM

## 2023-10-20 DIAGNOSIS — Z87440 Personal history of urinary (tract) infections: Secondary | ICD-10-CM

## 2023-10-20 DIAGNOSIS — Z820 Family history of epilepsy and other diseases of the nervous system: Secondary | ICD-10-CM

## 2023-10-20 DIAGNOSIS — Z79890 Hormone replacement therapy (postmenopausal): Secondary | ICD-10-CM | POA: Diagnosis not present

## 2023-10-20 DIAGNOSIS — Z8616 Personal history of COVID-19: Secondary | ICD-10-CM | POA: Diagnosis not present

## 2023-10-20 DIAGNOSIS — Z981 Arthrodesis status: Secondary | ICD-10-CM

## 2023-10-20 DIAGNOSIS — Z7984 Long term (current) use of oral hypoglycemic drugs: Secondary | ICD-10-CM | POA: Diagnosis not present

## 2023-10-20 DIAGNOSIS — Z823 Family history of stroke: Secondary | ICD-10-CM

## 2023-10-20 DIAGNOSIS — Z833 Family history of diabetes mellitus: Secondary | ICD-10-CM | POA: Diagnosis not present

## 2023-10-20 DIAGNOSIS — G4730 Sleep apnea, unspecified: Secondary | ICD-10-CM | POA: Diagnosis present

## 2023-10-20 DIAGNOSIS — A4150 Gram-negative sepsis, unspecified: Secondary | ICD-10-CM | POA: Diagnosis not present

## 2023-10-20 DIAGNOSIS — Z825 Family history of asthma and other chronic lower respiratory diseases: Secondary | ICD-10-CM

## 2023-10-20 LAB — URINALYSIS, W/ REFLEX TO CULTURE (INFECTION SUSPECTED)
Bilirubin Urine: NEGATIVE
Glucose, UA: NEGATIVE mg/dL
Ketones, ur: NEGATIVE mg/dL
Nitrite: POSITIVE — AB
Protein, ur: 100 mg/dL — AB
Specific Gravity, Urine: 1.017 (ref 1.005–1.030)
WBC, UA: >50 WBC/hpf (ref 0–5)
pH: 6 (ref 5.0–8.0)

## 2023-10-20 LAB — CBC WITH DIFFERENTIAL/PLATELET
Abs Immature Granulocytes: 0.13 K/uL — ABNORMAL HIGH (ref 0.00–0.07)
Basophils Absolute: 0.1 K/uL (ref 0.0–0.1)
Basophils Relative: 0 %
Eosinophils Absolute: 0.2 K/uL (ref 0.0–0.5)
Eosinophils Relative: 1 %
HCT: 33.7 % — ABNORMAL LOW (ref 36.0–46.0)
Hemoglobin: 11.3 g/dL — ABNORMAL LOW (ref 12.0–15.0)
Immature Granulocytes: 1 %
Lymphocytes Relative: 10 %
Lymphs Abs: 1.7 K/uL (ref 0.7–4.0)
MCH: 32.1 pg (ref 26.0–34.0)
MCHC: 33.5 g/dL (ref 30.0–36.0)
MCV: 95.7 fL (ref 80.0–100.0)
Monocytes Absolute: 2.2 K/uL — ABNORMAL HIGH (ref 0.1–1.0)
Monocytes Relative: 13 %
Neutro Abs: 12.5 K/uL — ABNORMAL HIGH (ref 1.7–7.7)
Neutrophils Relative %: 75 %
Platelets: 293 K/uL (ref 150–400)
RBC: 3.52 MIL/uL — ABNORMAL LOW (ref 3.87–5.11)
RDW: 12.9 % (ref 11.5–15.5)
WBC: 16.9 K/uL — ABNORMAL HIGH (ref 4.0–10.5)
nRBC: 0 % (ref 0.0–0.2)

## 2023-10-20 LAB — COMPREHENSIVE METABOLIC PANEL WITH GFR
ALT: 18 U/L (ref 0–44)
AST: 25 U/L (ref 15–41)
Albumin: 3.8 g/dL (ref 3.5–5.0)
Alkaline Phosphatase: 42 U/L (ref 38–126)
Anion gap: 13 (ref 5–15)
BUN: 16 mg/dL (ref 8–23)
CO2: 19 mmol/L — ABNORMAL LOW (ref 22–32)
Calcium: 9.1 mg/dL (ref 8.9–10.3)
Chloride: 106 mmol/L (ref 98–111)
Creatinine, Ser: 1.32 mg/dL — ABNORMAL HIGH (ref 0.44–1.00)
GFR, Estimated: 42 mL/min — ABNORMAL LOW (ref >60)
Glucose, Bld: 139 mg/dL — ABNORMAL HIGH (ref 70–99)
Potassium: 3.8 mmol/L (ref 3.5–5.1)
Sodium: 138 mmol/L (ref 135–145)
Total Bilirubin: 0.8 mg/dL (ref 0.0–1.2)
Total Protein: 7.4 g/dL (ref 6.5–8.1)

## 2023-10-20 LAB — PROTIME-INR
INR: 1.1 (ref 0.8–1.2)
Prothrombin Time: 14.5 s (ref 11.4–15.2)

## 2023-10-20 LAB — RESP PANEL BY RT-PCR (RSV, FLU A&B, COVID)  RVPGX2
Influenza A by PCR: NEGATIVE
Influenza B by PCR: NEGATIVE
Resp Syncytial Virus by PCR: NEGATIVE
SARS Coronavirus 2 by RT PCR: NEGATIVE

## 2023-10-20 LAB — LACTIC ACID, PLASMA: Lactic Acid, Venous: 1.3 mmol/L (ref 0.5–1.9)

## 2023-10-20 MED ORDER — INSULIN ASPART 100 UNIT/ML IJ SOLN
0.0000 [IU] | Freq: Three times a day (TID) | INTRAMUSCULAR | Status: DC
  Administered 2023-10-21: 2 [IU] via SUBCUTANEOUS

## 2023-10-20 MED ORDER — VITAMIN D-3 25 MCG (1000 UT) PO CAPS
1000.0000 [IU] | ORAL_CAPSULE | Freq: Every morning | ORAL | Status: DC
  Filled 2023-10-20 (×3): qty 1

## 2023-10-20 MED ORDER — ACETAMINOPHEN 325 MG PO TABS
650.0000 mg | ORAL_TABLET | Freq: Four times a day (QID) | ORAL | Status: DC | PRN
  Administered 2023-10-21 – 2023-10-22 (×3): 650 mg via ORAL
  Filled 2023-10-20 (×3): qty 2

## 2023-10-20 MED ORDER — SIMVASTATIN 20 MG PO TABS
20.0000 mg | ORAL_TABLET | Freq: Every evening | ORAL | Status: DC
  Administered 2023-10-20 – 2023-10-21 (×2): 20 mg via ORAL
  Filled 2023-10-20 (×2): qty 1

## 2023-10-20 MED ORDER — LACTATED RINGERS IV BOLUS (SEPSIS)
1000.0000 mL | Freq: Once | INTRAVENOUS | Status: AC
  Administered 2023-10-20: 1000 mL via INTRAVENOUS

## 2023-10-20 MED ORDER — SODIUM CHLORIDE 0.9 % IV SOLN
2.0000 g | Freq: Once | INTRAVENOUS | Status: AC
  Administered 2023-10-20: 2 g via INTRAVENOUS
  Filled 2023-10-20: qty 20

## 2023-10-20 MED ORDER — VITAMIN B-12 1000 MCG PO TABS
500.0000 ug | ORAL_TABLET | Freq: Every morning | ORAL | Status: DC
  Administered 2023-10-21 – 2023-10-22 (×2): 500 ug via ORAL
  Filled 2023-10-20 (×3): qty 1

## 2023-10-20 MED ORDER — TRAZODONE HCL 50 MG PO TABS
50.0000 mg | ORAL_TABLET | Freq: Every day | ORAL | Status: DC
  Administered 2023-10-20 – 2023-10-21 (×2): 50 mg via ORAL
  Filled 2023-10-20 (×2): qty 1

## 2023-10-20 MED ORDER — FAMOTIDINE 20 MG PO TABS
40.0000 mg | ORAL_TABLET | Freq: Every day | ORAL | Status: DC
  Administered 2023-10-20 – 2023-10-21 (×2): 40 mg via ORAL
  Filled 2023-10-20 (×2): qty 2

## 2023-10-20 MED ORDER — ENOXAPARIN SODIUM 40 MG/0.4ML IJ SOSY
40.0000 mg | PREFILLED_SYRINGE | INTRAMUSCULAR | Status: DC
  Administered 2023-10-20: 40 mg via SUBCUTANEOUS
  Filled 2023-10-20: qty 0.4

## 2023-10-20 MED ORDER — PANTOPRAZOLE SODIUM 40 MG PO TBEC
40.0000 mg | DELAYED_RELEASE_TABLET | Freq: Every day | ORAL | Status: DC
  Administered 2023-10-20 – 2023-10-21 (×2): 40 mg via ORAL
  Filled 2023-10-20 (×2): qty 1

## 2023-10-20 MED ORDER — ACETAMINOPHEN 650 MG RE SUPP: 650.0000 mg | Freq: Four times a day (QID) | RECTAL | Status: DC | PRN

## 2023-10-20 MED ORDER — IBUPROFEN 800 MG PO TABS
800.0000 mg | ORAL_TABLET | Freq: Once | ORAL | Status: AC
  Administered 2023-10-20: 800 mg via ORAL
  Filled 2023-10-20: qty 1

## 2023-10-20 MED ORDER — FLUTICASONE FUROATE-VILANTEROL 200-25 MCG/ACT IN AEPB
1.0000 | INHALATION_SPRAY | Freq: Every day | RESPIRATORY_TRACT | Status: DC
  Administered 2023-10-21 – 2023-10-22 (×2): 1 via RESPIRATORY_TRACT
  Filled 2023-10-20: qty 28

## 2023-10-20 MED ORDER — LEVOTHYROXINE SODIUM 75 MCG PO TABS
37.5000 ug | ORAL_TABLET | Freq: Every day | ORAL | Status: DC
  Administered 2023-10-21 – 2023-10-22 (×2): 37.5 ug via ORAL
  Filled 2023-10-20: qty 0.5
  Filled 2023-10-20 (×2): qty 1

## 2023-10-20 MED ORDER — TRAZODONE HCL 50 MG PO TABS
25.0000 mg | ORAL_TABLET | Freq: Every evening | ORAL | Status: DC | PRN
Start: 2023-10-20 — End: 2023-10-22

## 2023-10-20 MED ORDER — DULOXETINE HCL 30 MG PO CPEP
60.0000 mg | ORAL_CAPSULE | Freq: Every morning | ORAL | Status: DC
  Administered 2023-10-21 – 2023-10-22 (×2): 60 mg via ORAL
  Filled 2023-10-20 (×2): qty 2

## 2023-10-20 MED ORDER — SODIUM CHLORIDE 0.9 % IV SOLN
2.0000 g | INTRAVENOUS | Status: DC
  Administered 2023-10-21: 2 g via INTRAVENOUS
  Filled 2023-10-20: qty 20

## 2023-10-20 MED ORDER — METOCLOPRAMIDE HCL 5 MG/ML IJ SOLN: 5.0000 mg | Freq: Four times a day (QID) | INTRAMUSCULAR | Status: DC | PRN

## 2023-10-20 MED ORDER — MAGNESIUM HYDROXIDE 400 MG/5ML PO SUSP: 30.0000 mL | Freq: Every day | ORAL | Status: DC | PRN

## 2023-10-20 MED ORDER — LACTATED RINGERS IV SOLN
150.0000 mL/h | INTRAVENOUS | Status: DC
  Administered 2023-10-20 – 2023-10-21 (×2): 150 mL/h via INTRAVENOUS

## 2023-10-20 MED ORDER — ALBUTEROL SULFATE (2.5 MG/3ML) 0.083% IN NEBU: 3.0000 mL | INHALATION_SOLUTION | Freq: Four times a day (QID) | RESPIRATORY_TRACT | Status: DC | PRN

## 2023-10-20 MED ORDER — INSULIN ASPART 100 UNIT/ML IJ SOLN: 0.0000 [IU] | Freq: Every day | INTRAMUSCULAR | Status: DC

## 2023-10-20 MED ORDER — POLYSACCHARIDE IRON COMPLEX 150 MG PO CAPS
150.0000 mg | ORAL_CAPSULE | Freq: Every day | ORAL | Status: DC
  Administered 2023-10-21: 150 mg via ORAL
  Filled 2023-10-20: qty 1

## 2023-10-20 MED ORDER — LACTATED RINGERS IV BOLUS (SEPSIS)
250.0000 mL | Freq: Once | INTRAVENOUS | Status: AC
  Administered 2023-10-20: 250 mL via INTRAVENOUS

## 2023-10-20 MED ORDER — LACTATED RINGERS IV SOLN: INTRAVENOUS | Status: DC

## 2023-10-20 MED ORDER — ACETAMINOPHEN 500 MG PO TABS
1000.0000 mg | ORAL_TABLET | Freq: Once | ORAL | Status: AC
  Administered 2023-10-20: 1000 mg via ORAL
  Filled 2023-10-20: qty 2

## 2023-10-20 MED ORDER — LACTATED RINGERS IV BOLUS (SEPSIS)
500.0000 mL | Freq: Once | INTRAVENOUS | Status: AC
  Administered 2023-10-20: 500 mL via INTRAVENOUS

## 2023-10-20 NOTE — Assessment & Plan Note (Signed)
 Continue PPI therapy and H2 blocker therapy.

## 2023-10-20 NOTE — ED Notes (Signed)
 Pt has decided to stay- Dr Cleotilde has talked with pt

## 2023-10-20 NOTE — Assessment & Plan Note (Signed)
-   The patient will be placed on supplemental coverage with NovoLog. - Will hold off metformin. - Will continue Neurontin.

## 2023-10-20 NOTE — Progress Notes (Signed)
 Pt request for this writer to ask MD about taking her Metformin  and this Clinical research associate did reach out to MD on call and he stated that right now, he has her on sliding scale insulin  as this is short term for this hospitalization and to hold off on giving any Metformin  at this time. This Clinical research associate will pass this information along to the patient.

## 2023-10-20 NOTE — H&P (Signed)
 [place]   PATIENT NAME: Heather Espinoza    MR#:  [id number]  DATE OF BIRTH:  Aug 28, 1945  DATE OF ADMISSION:  10/20/2023  PRIMARY CARE PHYSICIAN: Kip Righter, MD   Patient is coming from: Home  REQUESTING/REFERRING PHYSICIAN: Cleotilde Rogue, MD  CHIEF COMPLAINT:   Chief Complaint  Patient presents with   Multiple Complaints    HISTORY OF PRESENT ILLNESS:  Heather Espinoza is a 78 y.o. Caucasian female with medical history significant for COPD, osteoarthritis, type 2 diabetes mellitus with diabetic neuropathy, GERD, essential tremors, and dyslipidemia, presented to the emergency room with acute onset of generalized weakness and high fever of 102.8 with chills and excessive sleepiness today.  She has urinary incontinence and is not sure about having urinary frequency but denied dysuria or hematuria or flank pain.  No cough or wheezing or dyspnea.  No chest pain or palpitations.  No nausea or vomiting or abdominal pain.  No bleeding diathesis.  ED Course: When she came to the ER, temperature was 102.8 with heart rate of 125 and otherwise normal vital signs.  Labs revealed a CO2 of 19 and blood glucose of 139 with creatinine 1.32.  CBC showed leukocytosis 16.9 and anemia with hemoglobin 11.3 and hematocrit 33.7 and neutrophilia.  Respiratory panel came back negative.  Lactic acid was 1.3.  UA came back positive for UTI.  Blood cultures and urine culture were sent. EKG as reviewed by me : EKG showed sinus tachycardia with sinus arrhythmia and poor R wave progression and Q waves inferiorly. Imaging: Portable chest x-ray showed no acute cardiopulmonary disease.  The patient was given 800 mg p.o. ibuprofen , 1 g of p.o. Tylenol , 1.75 L of IV lactated ringer  followed by 150 mL/h and 2 g of IV Rocephin .  She will be admitted to a medical telemetry bed for further evaluation and management. PAST MEDICAL HISTORY:   Past Medical History:  Diagnosis Date   Anemia    Arthritis    COPD  (chronic obstructive pulmonary disease) (HCC)    COVID-19 11/11/2020   Depression    Diabetes (HCC)    Diverticulitis    Diverticulosis    Dyspnea    Dysrhythmia    Family history of adverse reaction to anesthesia    sister has trouble waking up, requires additional oxygen   Fibromyalgia    GERD (gastroesophageal reflux disease)    Headache    High cholesterol    History of kidney stones    Hypertension    Hypothyroid    IBS (irritable bowel syndrome)    Osteopenia    Pneumonia    Restless leg    Sleep apnea    Doesn't use CPAP.     Tachycardia    per pt/fim   Tremor, essential 01/07/2018    PAST SURGICAL HISTORY:   Past Surgical History:  Procedure Laterality Date   ABDOMINAL HYSTERECTOMY     ANTERIOR CERVICAL DECOMPRESSION/DISCECTOMY FUSION 4 LEVELS N/A 01/31/2023   Procedure: ACDF,IP,PLATE/SCREWS R65, C45, C56, C67;  Surgeon: Mavis Purchase, MD;  Location: [facility] OR;  Service: Neurosurgery;  Laterality: N/A;   APPENDECTOMY     BACK SURGERY     BIOPSY  12/25/2016   Procedure: BIOPSY;  Surgeon: Golda Claudis PENNER, MD;  Location: [facility];  Service: Endoscopy;;  gastric    BIOPSY  10/26/2022   Procedure: BIOPSY;  Surgeon: Eartha Angelia Sieving, MD;  Location: [facility];  Service: Gastroenterology;;   BREAST LUMPECTOMY Right  03/20/1999   CARDIAC CATHETERIZATION  05/28/2000   CATARACT EXTRACTION Bilateral 03/19/2014   CATARACT EXTRACTION Bilateral    CHOLECYSTECTOMY     COLONOSCOPY N/A 12/17/2012   Procedure: COLONOSCOPY;  Surgeon: Claudis RAYMOND Rivet, MD;  Location: [facility];  Service: Endoscopy;  Laterality: N/A;  215   COLONOSCOPY WITH PROPOFOL  N/A 07/24/2019   Procedure: COLONOSCOPY WITH PROPOFOL ;  Surgeon: Rivet Claudis RAYMOND, MD;  Location: [facility];  Service: Endoscopy;  Laterality: N/A;  730   complete hysterectomy     DRUG INDUCED ENDOSCOPY N/A 09/02/2020   Procedure: DRUG INDUCED ENDOSCOPY;  Surgeon: Mable Lenis, MD;  Location: [facility];  Service: ENT;  Laterality: N/A;   ESOPHAGEAL DILATION N/A 12/25/2016   Procedure: ESOPHAGEAL DILATION;  Surgeon: Rivet Claudis RAYMOND, MD;  [facility];  Service: Endoscopy;  Laterality: N/A;   ESOPHAGEAL DILATION N/A 07/24/2019   Procedure: ESOPHAGEAL DILATION;  Surgeon: Rivet Claudis RAYMOND, MD;  [facility];  Service: Endoscopy;  Laterality: N/A;   ESOPHAGEAL DILATION N/A 08/02/2021   Procedure: ESOPHAGEAL DILATION;  Surgeon: Rivet Claudis RAYMOND, MD;  [facility];  Service: Endoscopy;  Laterality: N/A;   ESOPHAGOGASTRODUODENOSCOPY N/A 12/25/2016   Procedure: ESOPHAGOGASTRODUODENOSCOPY (EGD);  Surgeon: Rivet Claudis RAYMOND, MD;  [facility];  Service: Endoscopy;  Laterality: N/A;  730   ESOPHAGOGASTRODUODENOSCOPY (EGD) WITH PROPOFOL  N/A 07/24/2019   Procedure: ESOPHAGOGASTRODUODENOSCOPY (EGD) WITH PROPOFOL ;  Surgeon: Rivet Claudis RAYMOND, MD;  [facility];  Service: Endoscopy;  Laterality: N/A;   ESOPHAGOGASTRODUODENOSCOPY (EGD) WITH PROPOFOL  N/A 08/02/2021   Procedure: ESOPHAGOGASTRODUODENOSCOPY (EGD) WITH PROPOFOL ;  Surgeon: Rivet Claudis RAYMOND, MD;  [facility];  Service: Endoscopy;  Laterality: N/A;  1245 ASA 2   ESOPHAGOGASTRODUODENOSCOPY (EGD) WITH PROPOFOL  N/A 10/26/2022   Procedure: ESOPHAGOGASTRODUODENOSCOPY (EGD) WITH PROPOFOL ;  Surgeon: Eartha Angelia Sieving, MD;  [facility];  Service: Gastroenterology;  Laterality: N/A;  11:00AM;ASA 3   EXTRACORPOREAL SHOCK WAVE LITHOTRIPSY Left 02/21/2021   Procedure: EXTRACORPOREAL SHOCK WAVE LITHOTRIPSY (ESWL);  Surgeon: Roseann Adine PARAS., MD;  Location: [facility];  Service: Urology;  Laterality: Left;   Heel tumor removed     IMPLANTATION OF HYPOGLOSSAL NERVE STIMULATOR Right 11/25/2020   Procedure: IMPLANTATION OF HYPOGLOSSAL NERVE STIMULATOR;  Surgeon: Mable Lenis, MD;  Location: [facility] OR;  Service: ENT;  Laterality: Right;   POLYPECTOMY  07/24/2019    Procedure: POLYPECTOMY;  Surgeon: Rivet Claudis RAYMOND, MD;  [facility];  Service: Endoscopy;;   rt elbow surgery     SAVORY DILATION  10/26/2022   Procedure: SAVORY DILATION;  Surgeon: Eartha Angelia Sieving, MD;  [facility];  Service: Gastroenterology;;   THUMB FUSION Bilateral    TONSILLECTOMY     TRIGGER FINGER RELEASE     Right and Left, Dr. Shari, 2023/2024    SOCIAL HISTORY:   Social History   Tobacco Use   Smoking status: Never    Passive exposure: Never   Smokeless tobacco: Never  Substance Use Topics   Alcohol  use: No    Alcohol /week: 0.0 standard drinks of alcohol     FAMILY HISTORY:   Family History  Problem Relation Age of Onset   Uterine cancer Mother    Parkinson's disease Father    Stroke Father    Colon cancer Brother    Lung cancer Brother    COPD Sister    Diabetes Grandchild    Asthma Son     DRUG  ALLERGIES:   Allergies  Allergen Reactions   Flagyl [Metronidazole] Shortness Of Breath and Swelling   Lyrica [Pregabalin] Shortness Of Breath   Adhesive [Tape] Other (See Comments)    Takes off patient's skin   Aspirin  Other (See Comments)    Stomach bleed   Latex Itching and Rash   Phenergan [Promethazine Hcl] Other (See Comments)    Caused grogginess, altered mental status   Prednisone Anxiety   Sulfa Antibiotics Nausea Only   Tetanus Toxoids Swelling    Arm Area   Wound Dressing Adhesive Rash    REVIEW OF SYSTEMS:   ROS As per history of present illness. All pertinent systems were reviewed above. Constitutional, HEENT, cardiovascular, respiratory, GI, GU, musculoskeletal, neuro, psychiatric, endocrine, integumentary and hematologic systems were reviewed and are otherwise negative/unremarkable except for positive findings mentioned above in the HPI.   MEDICATIONS AT HOME:   Prior to Admission medications   Medication Sig Start Date End Date Taking? Authorizing Provider  albuterol  (PROVENTIL  HFA;VENTOLIN  HFA)  108 (90 Base) MCG/ACT inhaler Inhale 2 puffs into the lungs every 6 (six) hours as needed for wheezing or shortness of breath.    [provider]  aspirin  EC 81 MG tablet Take 81 mg by mouth daily. Swallow whole.    [provider]  Biotin  w/ Vitamins C & E (HAIR SKIN & NAILS GUMMIES PO) Take 2 tablets by mouth in the morning and at bedtime.    [provider]  Cholecalciferol  (VITAMIN D -3) 25 MCG (1000 UT) CAPS Take 1,000 Units by mouth in the morning.    [provider]  Cyanocobalamin  (B-12) 500 MCG TABS Take 500 mcg by mouth in the morning.    [provider]  cyclobenzaprine  (FLEXERIL ) 5 MG tablet Take 1 tablet (5 mg total) by mouth 3 (three) times daily as needed for muscle spasms. 02/01/23   Mavis Purchase, MD  dicyclomine  (BENTYL ) 10 MG capsule TAKE 1 CAPSULE BY MOUTH THREE TIMES DAILY BEFORE MEAL(S) 08/28/23   Carlan, Chelsea L, NP  docusate sodium  (COLACE) 100 MG capsule Take 2 capsules (200 mg total) by mouth at bedtime. Patient taking differently: Take 200 mg by mouth at bedtime as needed for moderate constipation. 01/04/15   Rehman, Claudis PENNER, MD  docusate sodium  (COLACE) 100 MG capsule Take 1 capsule (100 mg total) by mouth 2 (two) times daily. 02/01/23   Mavis Purchase, MD  DULoxetine  (CYMBALTA ) 60 MG capsule Take 60 mg by mouth in the morning.    [provider]  famotidine  (PEPCID ) 40 MG tablet TAKE 1 TABLET BY MOUTH AT BEDTIME 08/13/23   Carlan, Chelsea L, NP  Fluticasone -Salmeterol (ADVAIR) 250-50 MCG/DOSE AEPB Inhale 1 puff into the lungs every 12 (twelve) hours.    [provider]  iron  polysaccharides (FERREX 150) 150 MG capsule Take 1 capsule by mouth once daily 03/28/23   Carlan, Chelsea L, NP  levothyroxine  (SYNTHROID , LEVOTHROID) 75 MCG tablet Take 37.5 mcg by mouth daily before breakfast.    [provider]  lisinopril  (PRINIVIL ,ZESTRIL ) 10 MG tablet Take 10 mg by mouth in the morning.    [provider]  loperamide (IMODIUM A-D) 2 MG tablet Take 2 mg by mouth 4 (four) times daily as needed for diarrhea or loose stools.    [provider]  metFORMIN  (GLUCOPHAGE -XR) 500 MG 24 hr tablet Take 500 mg by mouth daily before supper. 01/12/19   [provider]  neomycin-bacitracin -polymyxin (NEOSPORIN) ointment Apply 1 application topically as needed for  wound care.    [provider]  omeprazole  (PRILOSEC) 40 MG capsule Take 1 capsule by mouth once daily 08/05/23   Carlan, Chelsea L, NP  ondansetron  (ZOFRAN ) 4 MG tablet TAKE 1 TABLET BY MOUTH EVERY 8 HOURS AS NEEDED FOR NAUSEA OR VOMITING 10/08/23   Carlan, Chelsea L, NP  oxyCODONE -acetaminophen  (PERCOCET/ROXICET) 5-325 MG tablet Take 1 tablet by mouth every 4 (four) hours as needed for moderate pain (pain score 4-6). 02/01/23   Mavis Purchase, MD  simvastatin  (ZOCOR ) 20 MG tablet Take 20 mg by mouth every evening.    [provider]  traMADol  (ULTRAM ) 50 MG tablet Take 50 mg by mouth every 6 (six) hours as needed for severe pain (pain score 7-10). 11/10/22   [provider]  traZODone  (DESYREL ) 100 MG tablet Take 50 mg by mouth at bedtime.    [provider]  Vitamin E  670 MG (1000 UT) CAPS Take 1,000 Units by mouth in the morning.    [provider]      VITAL SIGNS:  Blood pressure 117/68, pulse 87, temperature 98.3 F (36.8 C), temperature source Oral, resp. rate 19, height 5' 3 (1.6 m), weight 53.5 kg, SpO2 99%.  PHYSICAL EXAMINATION:  Physical Exam  GENERAL:  78 y.o.-year-old patient lying in the bed with no acute distress.  EYES: Pupils equal, round, reactive to light and accommodation. No scleral icterus. Extraocular muscles intact.  HEENT: Head atraumatic, normocephalic. Oropharynx and nasopharynx clear.  NECK:  Supple, no jugular venous distention. No thyroid  enlargement, no tenderness.  LUNGS: Normal breath sounds bilaterally, no wheezing, rales,rhonchi or  crepitation. No use of accessory muscles of respiration.  CARDIOVASCULAR: Regular rate and rhythm, S1, S2 normal. No murmurs, rubs, or gallops.  ABDOMEN: Soft, nondistended, nontender. Bowel sounds present. No organomegaly or mass.  EXTREMITIES: No pedal edema, cyanosis, or clubbing.  NEUROLOGIC: Cranial nerves II through XII are intact. Muscle strength 5/5 in all extremities. Sensation intact. Gait not checked.  PSYCHIATRIC: The patient is alert and oriented x 3.  Normal affect and good eye contact. SKIN: No obvious rash, lesion, or ulcer.   LABORATORY PANEL:   CBC Recent Labs  Lab 10/20/23 1749  WBC 16.9*  HGB 11.3*  HCT 33.7*  PLT 293   ------------------------------------------------------------------------------------------------------------------  Chemistries  Recent Labs  Lab 10/20/23 1749  NA 138  K 3.8  CL 106  CO2 19*  GLUCOSE 139*  BUN 16  CREATININE 1.32*  CALCIUM 9.1  AST 25  ALT 18  ALKPHOS 42  BILITOT 0.8   ------------------------------------------------------------------------------------------------------------------  Cardiac Enzymes No results for input(s): TROPONINI in the last 168 hours. ------------------------------------------------------------------------------------------------------------------  RADIOLOGY:  DG Chest Port 1 View Result Date: 10/20/2023 CLINICAL DATA:  Possible sepsis.  Fever and increased urination. EXAM: PORTABLE CHEST 1 VIEW COMPARISON:  04/08/2018. FINDINGS: The heart is enlarged the mediastinal contour is within normal limits. There is atherosclerotic calcification of the aorta. No consolidation, effusion, or pneumothorax is seen. A stimulator device is noted over the right chest with lead coursing over the cervical region on the right. Cervical spinal fusion hardware is noted. No acute osseous abnormality. IMPRESSION: No active disease. Electronically Signed   By: Leita Birmingham M.D.   On: 10/20/2023 18:39       IMPRESSION AND PLAN:  Assessment and Plan: * Sepsis due to gram-negative UTI [facility]) - The patient will be admitted to a medical telemetry bed. - Will continue antibiotic therapy with IV Rocephin . - Will continue hydration with IV lactated  ringer. - We will follow blood and urine cultures. - Her sepsis manifested by leukocytosis, fever, tachycardia and tachypnea.   Type 2 diabetes mellitus with peripheral neuropathy (HCC) - The patient will be placed on supplemental coverage with NovoLog . - Will hold off metformin . - Will continue Neurontin .  Hypothyroidism - Continue Synthroid .  GERD without esophagitis - Continue PPI therapy and H2 blocker therapy.  Dyslipidemia - Will continue statin therapy.  Chronic obstructive pulmonary disease (COPD) (HCC) - Will continue her inhalers.  Depression - Will continue Cymbalta  and trazodone .   DVT prophylaxis: Lovenox .  Advanced Care Planning:  Code Status: full code.  Family Communication:  The plan of care was discussed in details with the patient (and family). I answered all questions. The patient agreed to proceed with the above mentioned plan. Further management will depend upon hospital course. Disposition Plan: Back to previous home environment Consults called: none.  All the records are reviewed and case discussed with ED provider.  Status is: Inpatient  At the time of the admission, it appears that the appropriate admission status for this patient is inpatient.  This is judged to be reasonable and necessary in order to provide the required intensity of service to ensure the patient's safety given the presenting symptoms, physical exam findings and initial radiographic and laboratory data in the context of comorbid conditions.  The patient requires inpatient status due to high intensity of service, high risk of further deterioration and high frequency of surveillance required.  I certify that at the time of admission, it  is my clinical judgment that the patient will require inpatient hospital care extending more than 2 midnights.                            Dispo: The patient is from: Home              Anticipated d/c is to: Home              Patient currently is not medically stable to d/c.              Difficult to place patient: No  Madison DELENA Peaches M.D on 10/20/2023 at 11:32 PM  [facility]   From 7 PM-7 AM, contact night-coverage [email or web address]  CC: Primary care physician; Kip Righter, MD

## 2023-10-20 NOTE — Assessment & Plan Note (Signed)
 Will continue statin therapy

## 2023-10-20 NOTE — ED Provider Notes (Signed)
 [facility] AT [facility] Provider Note   CSN: [id number] Arrival date & time: 10/20/23  1656     Patient presents with: Multiple Complaints   Heather Espinoza is a 78 y.o. female.   HPI    This patient is a 78 year old female, she has a history of some recurrent urinary tract infections, history of hypothyroidism as well as a history of diabetes on metformin , hypertension on lisinopril  and high cholesterol on a statin.  She presents to the hospital with feeling generally weak for the last 2 days, her husband is a Camera operator and has been away from home for the last several days but came home yesterday and found her to be not herself.  Today she was unable to get out of bed because of weakness, she was confused, she could not finish her thoughts and was noted to be febrile to 102.8 at home.  The patient is brought in today after urinating on herself in the bed.  The patient denies coughing shortness of breath or abdominal pain    Prior to Admission medications   Medication Sig Start Date End Date Taking? Authorizing Provider  albuterol  (PROVENTIL  HFA;VENTOLIN  HFA) 108 (90 Base) MCG/ACT inhaler Inhale 2 puffs into the lungs every 6 (six) hours as needed for wheezing or shortness of breath.    [provider]  aspirin  EC 81 MG tablet Take 81 mg by mouth daily. Swallow whole.    [provider]  Biotin  w/ Vitamins C & E (HAIR SKIN & NAILS GUMMIES PO) Take 2 tablets by mouth in the morning and at bedtime.    [provider]  Cholecalciferol  (VITAMIN D -3) 25 MCG (1000 UT) CAPS Take 1,000 Units by mouth in the morning.    [provider]  Cyanocobalamin  (B-12) 500 MCG TABS Take 500 mcg by mouth in the morning.    [provider]  cyclobenzaprine  (FLEXERIL ) 5 MG tablet Take 1 tablet (5 mg total) by mouth 3 (three) times daily as needed for muscle spasms. 02/01/23   Mavis Purchase, MD  dicyclomine  (BENTYL ) 10 MG  capsule TAKE 1 CAPSULE BY MOUTH THREE TIMES DAILY BEFORE MEAL(S) 08/28/23   Carlan, Chelsea L, NP  docusate sodium  (COLACE) 100 MG capsule Take 2 capsules (200 mg total) by mouth at bedtime. Patient taking differently: Take 200 mg by mouth at bedtime as needed for moderate constipation. 01/04/15   Rehman, Claudis PENNER, MD  docusate sodium  (COLACE) 100 MG capsule Take 1 capsule (100 mg total) by mouth 2 (two) times daily. 02/01/23   Mavis Purchase, MD  DULoxetine  (CYMBALTA ) 60 MG capsule Take 60 mg by mouth in the morning.    [provider]  famotidine  (PEPCID ) 40 MG tablet TAKE 1 TABLET BY MOUTH AT BEDTIME 08/13/23   Carlan, Chelsea L, NP  Fluticasone -Salmeterol (ADVAIR) 250-50 MCG/DOSE AEPB Inhale 1 puff into the lungs every 12 (twelve) hours.    [provider]  iron  polysaccharides (FERREX 150) 150 MG capsule Take 1 capsule by mouth once daily 03/28/23   Carlan, Chelsea L, NP  levothyroxine  (SYNTHROID , LEVOTHROID) 75 MCG tablet Take 37.5 mcg by mouth daily before breakfast.    [provider]  lisinopril  (PRINIVIL ,ZESTRIL ) 10 MG tablet Take 10 mg by mouth in the morning.    [provider]  loperamide (IMODIUM A-D) 2 MG tablet Take 2 mg by mouth 4 (four) times daily as needed for diarrhea or loose stools.    [provider]  metFORMIN  (GLUCOPHAGE -XR) 500 MG 24 hr tablet Take 500 mg by mouth daily before supper. 01/12/19   [provider]  neomycin-bacitracin -polymyxin (NEOSPORIN) ointment Apply 1 application topically as needed for wound care.    [provider]  omeprazole  (PRILOSEC) 40 MG capsule Take 1 capsule by mouth once daily 08/05/23   Carlan, Chelsea L, NP  ondansetron  (ZOFRAN ) 4 MG tablet TAKE 1 TABLET BY MOUTH EVERY 8 HOURS AS NEEDED FOR NAUSEA OR VOMITING 10/08/23   Carlan, Chelsea L, NP  oxyCODONE -acetaminophen  (PERCOCET/ROXICET) 5-325 MG tablet Take 1 tablet by mouth every 4 (four) hours as needed for moderate pain (pain score  4-6). 02/01/23   Mavis Purchase, MD  simvastatin  (ZOCOR ) 20 MG tablet Take 20 mg by mouth every evening.    [provider]  traMADol  (ULTRAM ) 50 MG tablet Take 50 mg by mouth every 6 (six) hours as needed for severe pain (pain score 7-10). 11/10/22   [provider]  traZODone  (DESYREL ) 100 MG tablet Take 50 mg by mouth at bedtime.    [provider]  Vitamin E  670 MG (1000 UT) CAPS Take 1,000 Units by mouth in the morning.    [provider]    Allergies: Flagyl [metronidazole], Lyrica [pregabalin], Adhesive [tape], Aspirin , Latex, Phenergan [promethazine hcl], Prednisone, Sulfa antibiotics, Tetanus toxoids, and Wound dressing adhesive    Review of Systems  Unable to perform ROS: Mental status change    Updated Vital Signs BP 123/60   Pulse 90   Temp (!) 101.5 F (38.6 C) (Oral)   Resp 17   Ht 1.6 m (5' 3)   Wt 53.5 kg   SpO2 99%   BMI 20.90 kg/m   Physical Exam Vitals and nursing note reviewed.  Constitutional:      General: She is in acute distress.     Appearance: She is well-developed. She is ill-appearing.  HENT:     Head: Normocephalic and atraumatic.     Mouth/Throat:     Pharynx: No oropharyngeal exudate.  Eyes:     General: No scleral icterus.       Right eye: No discharge.        Left eye: No discharge.     Conjunctiva/sclera: Conjunctivae normal.     Pupils: Pupils are equal, round, and reactive to light.  Neck:     Thyroid : No thyromegaly.     Vascular: No JVD.  Cardiovascular:     Rate and Rhythm: Regular rhythm. Tachycardia present.     Heart sounds: Normal heart sounds. No murmur heard.    No friction rub. No gallop.  Pulmonary:     Effort: Pulmonary effort is normal. No respiratory distress.     Breath sounds: Normal breath sounds. No wheezing or rales.  Abdominal:     General: Bowel sounds are normal. There is no distension.     Palpations: Abdomen is soft. There is no mass.     Tenderness: There is  abdominal tenderness.  Musculoskeletal:        General: No tenderness. Normal range of motion.     Cervical back: Normal range of motion and neck supple.     Right lower leg: No edema.     Left lower leg: No edema.  Lymphadenopathy:     Cervical: No cervical adenopathy.  Skin:    General: Skin is warm and dry.     Findings: No erythema or rash.  Neurological:     Mental Status: She is alert.  Comments: Patient has a difficult time keeping her focused and answering questions, she has clear speech that being said and is able to follow commands with all 4 extremities.  Psychiatric:        Behavior: Behavior normal.     (all labs ordered are listed, but only abnormal results are displayed) Labs Reviewed  COMPREHENSIVE METABOLIC PANEL WITH GFR - Abnormal; Notable for the following components:      Result Value   CO2 19 (*)    Glucose, Bld 139 (*)    Creatinine, Ser 1.32 (*)    GFR, Estimated 42 (*)    All other components within normal limits  CBC WITH DIFFERENTIAL/PLATELET - Abnormal; Notable for the following components:   WBC 16.9 (*)    RBC 3.52 (*)    Hemoglobin 11.3 (*)    HCT 33.7 (*)    Neutro Abs 12.5 (*)    Monocytes Absolute 2.2 (*)    Abs Immature Granulocytes 0.13 (*)    All other components within normal limits  URINALYSIS, W/ REFLEX TO CULTURE (INFECTION SUSPECTED) - Abnormal; Notable for the following components:   APPearance CLOUDY (*)    Hgb urine dipstick MODERATE (*)    Protein, ur 100 (*)    Nitrite POSITIVE (*)    Leukocytes,Ua LARGE (*)    Bacteria, UA RARE (*)    Crystals PRESENT (*)    All other components within normal limits  CULTURE, BLOOD (ROUTINE X 2)  CULTURE, BLOOD (ROUTINE X 2)  RESP PANEL BY RT-PCR (RSV, FLU A&B, COVID)  RVPGX2  URINE CULTURE  LACTIC ACID, PLASMA  PROTIME-INR    EKG: EKG Interpretation Date/Time:  Sunday October 20 2023 18:55:53 EDT Ventricular Rate:  128 PR Interval:  85 QRS Duration:  47 QT  Interval:  332 QTC Calculation: 485 R Axis:   50  Text Interpretation: Sinus or ectopic atrial tachycardia Abnormal R-wave progression, early transition Borderline repolarization abnormality new onset afib Confirmed by Cleotilde Rogue ([id number]) on 10/20/2023 6:57:19 PM  Radiology: [facility] Chest Port 1 View Result Date: 10/20/2023 CLINICAL DATA:  Possible sepsis.  Fever and increased urination. EXAM: PORTABLE CHEST 1 VIEW COMPARISON:  04/08/2018. FINDINGS: The heart is enlarged the mediastinal contour is within normal limits. There is atherosclerotic calcification of the aorta. No consolidation, effusion, or pneumothorax is seen. A stimulator device is noted over the right chest with lead coursing over the cervical region on the right. Cervical spinal fusion hardware is noted. No acute osseous abnormality. IMPRESSION: No active disease. Electronically Signed   By: Leita Birmingham M.D.   On: 10/20/2023 18:39     .Critical Care  Performed by: Cleotilde Rogue, MD Authorized by: Cleotilde Rogue, MD   Critical care provider statement:    Critical care time (minutes):  45   Critical care time was exclusive of:  Separately billable procedures and treating other patients and teaching time   Critical care was necessary to treat or prevent imminent or life-threatening deterioration of the following conditions:  Sepsis   Critical care was time spent personally by me on the following activities:  Development of treatment plan with patient or surrogate, discussions with consultants, evaluation of patient's response to treatment, examination of patient, obtaining history from patient or surrogate, review of old charts, re-evaluation of patient's condition, pulse oximetry, ordering and review of radiographic studies, ordering and review of laboratory studies and ordering and performing treatments and interventions   I assumed direction of critical care for this patient from  another provider in my specialty: no     Care  discussed with: admitting provider   Comments:          Medications Ordered in the ED  lactated ringers  infusion ( Intravenous New Bag/Given 10/20/23 1931)  lactated ringers  bolus 1,000 mL (0 mLs Intravenous Stopped 10/20/23 2002)    And  lactated ringers  bolus 500 mL (0 mLs Intravenous Stopped 10/20/23 1930)    And  lactated ringers  bolus 250 mL (0 mLs Intravenous Stopped 10/20/23 2002)  cefTRIAXone  (ROCEPHIN ) 2 g in sodium chloride  0.9 % 100 mL IVPB (0 g Intravenous Stopped 10/20/23 1930)  ibuprofen  (ADVIL ) tablet 800 mg (800 mg Oral Given 10/20/23 1937)  acetaminophen  (TYLENOL ) tablet 1,000 mg (1,000 mg Oral Given 10/20/23 1937)                                    Medical Decision Making Amount and/or Complexity of Data Reviewed Labs: ordered. Radiology: ordered.  Risk OTC drugs. Prescription drug management. Decision regarding hospitalization.    This patient presents to the ED for concern of fever and change in mental status, this involves an extensive number of treatment options, and is a complaint that carries with it a high risk of complications and morbidity.  The differential diagnosis includes sepsis, UTI, other sources of infection, the patient does not have a stiff neck, no seizures, has a nonfocal abdominal exam and does not have any surgical findings   Co morbidities / Chronic conditions that complicate the patient evaluation  Multiple medical problems including diabetes   Additional history obtained:  Additional history obtained from EMR External records from outside source obtained and reviewed including admitted for sepsis in December 2020 for   Lab Tests:  I Ordered, and personally interpreted labs.  The pertinent results include: Leukocytosis, metabolic panel without renal failure or electrolyte abnormalities   Imaging Studies ordered:  I ordered imaging studies including chest x-ray I independently visualized and interpreted imaging which showed no acute  findings I agree with the radiologist interpretation   Cardiac Monitoring: / EKG:  The patient was maintained on a cardiac monitor.  I personally viewed and interpreted the cardiac monitored which showed an underlying rhythm of: Atrial fibrillation with rapid ventricular rate   Problem List / ED Course / Critical interventions / Medication management  This patient presents with fever tachycardia weakness and what appears to be sepsis based on the presence of fever tachycardia and leukocytosis.  Urinalysis pending, suspect UTI, Rocephin  for UTI, Tylenol  and Motrin  ordered for fever I ordered medication including IV fluids at 30 cc/kg, lactate is normal thankfully, blood pressures remained in a normal range, continues to be significantly tachycardic in A-fib, avoiding treating the A-fib primarily as it is likely result of the sepsis syndrome Reevaluation of the patient after these medicines showed that the patient slight improvement I have reviewed the patients home medicines and have made adjustments as needed   Consultations Obtained:  I requested consultation with the hospitalist,  and discussed lab and imaging findings as well as pertinent plan - they recommend: Admission to the hospital to high level of care   Social Determinants of Health:  Sepsis   Test / Admission - Considered:  Admit, critically ill      Final diagnoses:  Sepsis, due to unspecified organism, unspecified whether acute organ dysfunction present [facility])  Atrial fibrillation with rapid ventricular response (HCC)  ED Discharge Orders     None          Cleotilde Rogue, MD 10/20/23 2013

## 2023-10-20 NOTE — Assessment & Plan Note (Signed)
-   Will continue her inhalers.

## 2023-10-20 NOTE — ED Notes (Addendum)
 pt is saying she doesn't want to be admitted, wants to know if she can take anbx at home? Dr Lawence made aware- secure chat requesting Dr Lawence come and talk with pt/family Pt encouraged to stay for need treatment and admission

## 2023-10-20 NOTE — Assessment & Plan Note (Signed)
 Continue Synthroid

## 2023-10-20 NOTE — ED Triage Notes (Signed)
 Pt stated that she is urinating more often, is weak, has a fever and just overall does not feel good. Symptoms started 3 days ago and pt's family stated that she has been unable to get out of bed today,.

## 2023-10-20 NOTE — Progress Notes (Signed)
 Patient arrived to this unit and was admitted to room 315. Pts husband Pas[place] at bedside but is leaving shortly due to having to go out of town to drive his Marine scientist for work. Pt is alert, verbal oriented x4. Pleasant but tearful due to having to leave her puppy at home as she worries about her little puppydog. Will complete admission. VSS. Denies pain. Call light within reach.

## 2023-10-20 NOTE — Assessment & Plan Note (Signed)
-   The patient will be admitted to a medical telemetry bed. - Will continue antibiotic therapy with IV Rocephin . - Will continue hydration with IV lactated ringer . - We will follow blood and urine cultures. - Her sepsis manifested by leukocytosis, fever, tachycardia and tachypnea.

## 2023-10-20 NOTE — ED Notes (Signed)
 Pt experiencing runs of SVT in the 160s. Dr. Cleotilde aware. EKG captured of event. HR will then quickly drop back to 110s.

## 2023-10-20 NOTE — Assessment & Plan Note (Signed)
-   Will continue Cymbalta  and trazodone .

## 2023-10-20 NOTE — Consult Note (Signed)
 CODE SEPSIS - PHARMACY COMMUNICATION  **Broad-spectrum antimicrobials should be administered within one hour of sepsis diagnosis**  Time Code Sepsis call or page was received: 1750  Antibiotics ordered: Ceftriaxone   Time of first antibiotic administration: 1903  Additional action taken by pharmacy: Messaged RN     Will M. Lenon, PharmD Clinical Pharmacist 10/20/2023 5:53 PM

## 2023-10-20 NOTE — Progress Notes (Signed)
 Pt reports that she needs assistance with some of her medications as they are too expensive for her to buy. She also reports that it is difficult to get to some of her specialists due to her location [place]).  [place] RN

## 2023-10-21 ENCOUNTER — Encounter (HOSPITAL_COMMUNITY): Payer: Self-pay | Admitting: Family Medicine

## 2023-10-21 DIAGNOSIS — N390 Urinary tract infection, site not specified: Secondary | ICD-10-CM | POA: Diagnosis not present

## 2023-10-21 DIAGNOSIS — A4150 Gram-negative sepsis, unspecified: Secondary | ICD-10-CM | POA: Diagnosis not present

## 2023-10-21 LAB — CBC
HCT: 24.9 % — ABNORMAL LOW (ref 36.0–46.0)
Hemoglobin: 8 g/dL — ABNORMAL LOW (ref 12.0–15.0)
MCH: 31.9 pg (ref 26.0–34.0)
MCHC: 32.1 g/dL (ref 30.0–36.0)
MCV: 99.2 fL (ref 80.0–100.0)
Platelets: 179 K/uL (ref 150–400)
RBC: 2.51 MIL/uL — ABNORMAL LOW (ref 3.87–5.11)
RDW: 13.1 % (ref 11.5–15.5)
WBC: 10.3 K/uL (ref 4.0–10.5)
nRBC: 0 % (ref 0.0–0.2)

## 2023-10-21 LAB — PROTIME-INR
INR: 1.2 (ref 0.8–1.2)
Prothrombin Time: 15.6 s — ABNORMAL HIGH (ref 11.4–15.2)

## 2023-10-21 LAB — BASIC METABOLIC PANEL WITH GFR
Anion gap: 7 (ref 5–15)
BUN: 15 mg/dL (ref 8–23)
CO2: 20 mmol/L — ABNORMAL LOW (ref 22–32)
Calcium: 7.8 mg/dL — ABNORMAL LOW (ref 8.9–10.3)
Chloride: 112 mmol/L — ABNORMAL HIGH (ref 98–111)
Creatinine, Ser: 1.14 mg/dL — ABNORMAL HIGH (ref 0.44–1.00)
GFR, Estimated: 50 mL/min — ABNORMAL LOW (ref >60)
Glucose, Bld: 126 mg/dL — ABNORMAL HIGH (ref 70–99)
Potassium: 3.7 mmol/L (ref 3.5–5.1)
Sodium: 139 mmol/L (ref 135–145)

## 2023-10-21 LAB — RETICULOCYTES
Immature Retic Fract: 17.2 % — ABNORMAL HIGH (ref 2.3–15.9)
RBC.: 2.7 MIL/uL — ABNORMAL LOW (ref 3.87–5.11)
Retic Count, Absolute: 34 K/uL (ref 19.0–186.0)
Retic Ct Pct: 1.3 % (ref 0.4–3.1)

## 2023-10-21 LAB — VITAMIN B12: Vitamin B-12: 449 pg/mL (ref 180–914)

## 2023-10-21 LAB — FERRITIN: Ferritin: 58 ng/mL (ref 11–307)

## 2023-10-21 LAB — GLUCOSE, CAPILLARY
Glucose-Capillary: 136 mg/dL — ABNORMAL HIGH (ref 70–99)
Glucose-Capillary: 152 mg/dL — ABNORMAL HIGH (ref 70–99)
Glucose-Capillary: 86 mg/dL (ref 70–99)
Glucose-Capillary: 94 mg/dL (ref 70–99)
Glucose-Capillary: 98 mg/dL (ref 70–99)

## 2023-10-21 LAB — IRON AND TIBC
Iron: 6 ug/dL — ABNORMAL LOW (ref 28–170)
Saturation Ratios: 2 % — ABNORMAL LOW (ref 10.4–31.8)
TIBC: 286 ug/dL (ref 250–450)
UIBC: 280 ug/dL

## 2023-10-21 LAB — FOLATE: Folate: 12 ng/mL (ref >5.9)

## 2023-10-21 LAB — CORTISOL-AM, BLOOD: Cortisol - AM: 4.7 ug/dL — ABNORMAL LOW (ref 6.7–22.6)

## 2023-10-21 LAB — HEMOGLOBIN AND HEMATOCRIT, BLOOD
HCT: 24.9 % — ABNORMAL LOW (ref 36.0–46.0)
Hemoglobin: 8.1 g/dL — ABNORMAL LOW (ref 12.0–15.0)

## 2023-10-21 MED ORDER — SODIUM CHLORIDE 0.9 % IV BOLUS
500.0000 mL | Freq: Once | INTRAVENOUS | Status: AC
  Administered 2023-10-21: 500 mL via INTRAVENOUS

## 2023-10-21 MED ORDER — TRAMADOL HCL 50 MG PO TABS
50.0000 mg | ORAL_TABLET | Freq: Four times a day (QID) | ORAL | Status: DC | PRN
  Administered 2023-10-21: 50 mg via ORAL
  Filled 2023-10-21: qty 1

## 2023-10-21 MED ORDER — VITAMIN D 25 MCG (1000 UNIT) PO TABS
1000.0000 [IU] | ORAL_TABLET | Freq: Every morning | ORAL | Status: DC
  Administered 2023-10-21 – 2023-10-22 (×2): 1000 [IU] via ORAL
  Filled 2023-10-21 (×2): qty 1

## 2023-10-21 MED ORDER — SENNOSIDES-DOCUSATE SODIUM 8.6-50 MG PO TABS
1.0000 | ORAL_TABLET | Freq: Two times a day (BID) | ORAL | Status: DC
  Administered 2023-10-21 – 2023-10-22 (×2): 1 via ORAL
  Filled 2023-10-21 (×3): qty 1

## 2023-10-21 NOTE — Progress Notes (Signed)
 PROGRESS NOTE    Heather Espinoza  FMW:[id number] DOB: [birth year] DOA: 10/20/2023 PCP: Kip Righter, MD   Brief Narrative:    Heather Espinoza is a 78 y.o. Caucasian female with medical history significant for COPD, osteoarthritis, type 2 diabetes mellitus with diabetic neuropathy, GERD, essential tremors, and dyslipidemia, presented to the emergency room with acute onset of generalized weakness and high fever of 102.8 with chills and excessive sleepiness today.  Patient was admitted with sepsis, POA secondary to UTI.  Assessment & Plan:   Principal Problem:   Sepsis due to gram-negative UTI (HCC) Active Problems:   Type 2 diabetes mellitus with peripheral neuropathy (HCC)   Hypothyroidism   Depression   Chronic obstructive pulmonary disease (COPD) (HCC)   Dyslipidemia   GERD without esophagitis  Assessment and Plan:   Sepsis, POA due to UTI [facility]) - The patient will be admitted to a medical telemetry bed. - Will continue antibiotic therapy with IV Rocephin . - We will follow blood and urine cultures; no growth on blood cultures noted as of yet - Her sepsis manifested by leukocytosis, fever, tachycardia and tachypnea. -Noted to have fever of 102.8 Fahrenheit in the last 24 hours, continue to monitor temperature curve and if no further fever in a.m. could consider discharge.     Type 2 diabetes mellitus with peripheral neuropathy (HCC) - The patient will be placed on supplemental coverage with NovoLog . - Will hold off metformin . - Will continue Neurontin .   Hypothyroidism - Continue Synthroid .   GERD without esophagitis - Continue PPI therapy and H2 blocker therapy.   Dyslipidemia - Will continue statin therapy.   Chronic obstructive pulmonary disease (COPD) (HCC) - Will continue her inhalers.   Depression - Will continue Cymbalta  and trazodone .    DVT prophylaxis:Lovenox  to SCDs Code Status: Full Family Communication: None at bedside Disposition Plan:  Status is:  Inpatient Remains inpatient appropriate because: Need for IV medications.   Consultants:  None  Procedures:  None  Antimicrobials:  Anti-infectives (From admission, onward)    Start     Dose/Rate Route Frequency Ordered Stop   10/21/23 1800  cefTRIAXone  (ROCEPHIN ) 2 g in sodium chloride  0.9 % 100 mL IVPB        2 g 200 mL/hr over 30 Minutes Intravenous Every 24 hours 10/20/23 2027 10/28/23 1759   10/20/23 1800  cefTRIAXone  (ROCEPHIN ) 2 g in sodium chloride  0.9 % 100 mL IVPB        2 g 200 mL/hr over 30 Minutes Intravenous Once 10/20/23 1749 10/20/23 1930       Subjective: Patient seen and evaluated today with no new acute complaints or concerns. No acute concerns or events noted overnight.  She is tearful this morning as her puppy at home is not being taking care of and she would like to discharge whenever possible.  Objective: Vitals:   10/21/23 0110 10/21/23 0158 10/21/23 0514 10/21/23 0658  BP: (!) 92/33 (!) 100/57 (!) 102/56   Pulse: 61 63 (!) 58   Resp: 17  18   Temp: 97.6 F (36.4 C)  97.6 F (36.4 C)   TempSrc: Oral  Oral   SpO2: 97% 99% 97% 98%  Weight:      Height:        Intake/Output Summary (Last 24 hours) at 10/21/2023 1126 Last data filed at 10/20/2023 2230 Gross per 24 hour  Intake 2190 ml  Output --  Net 2190 ml   Filed Weights   10/20/23 1723  Weight:  53.5 kg    Examination:  General exam: Appears calm and comfortable  Respiratory system: Clear to auscultation. Respiratory effort normal.   Cardiovascular system: S1 & S2 heard, RRR.  Gastrointestinal system: Abdomen is soft Central nervous system: Alert and awake Extremities: No edema Skin: No significant lesions noted Psychiatry: Flat affect.    Data Reviewed: I have personally reviewed following labs and imaging studies  CBC: Recent Labs  Lab 10/20/23 1749 10/21/23 0540 10/21/23 0928  WBC 16.9* 10.3  --   NEUTROABS 12.5*  --   --   HGB 11.3* 8.0* 8.1*  HCT 33.7* 24.9* 24.9*   MCV 95.7 99.2  --   PLT 293 179  --    Basic Metabolic Panel: Recent Labs  Lab 10/20/23 1749 10/21/23 0414  NA 138 139  K 3.8 3.7  CL 106 112*  CO2 19* 20*  GLUCOSE 139* 126*  BUN 16 15  CREATININE 1.32* 1.14*  CALCIUM 9.1 7.8*   GFR: Estimated Creatinine Clearance: 35.3 mL/min (A) (by C-G formula based on SCr of 1.14 mg/dL (H)). Liver Function Tests: Recent Labs  Lab 10/20/23 1749  AST 25  ALT 18  ALKPHOS 42  BILITOT 0.8  PROT 7.4  ALBUMIN 3.8   No results for input(s): LIPASE, AMYLASE in the last 168 hours. No results for input(s): AMMONIA in the last 168 hours. Coagulation Profile: Recent Labs  Lab 10/20/23 1749 10/21/23 0414  INR 1.1 1.2   Cardiac Enzymes: No results for input(s): CKTOTAL, CKMB, CKMBINDEX, TROPONINI in the last 168 hours. BNP (last 3 results) No results for input(s): PROBNP in the last 8760 hours. HbA1C: No results for input(s): HGBA1C in the last 72 hours. CBG: Recent Labs  Lab 10/21/23 0008 10/21/23 0736 10/21/23 1116  GLUCAP 152* 86 136*   Lipid Profile: No results for input(s): CHOL, HDL, LDLCALC, TRIG, CHOLHDL, LDLDIRECT in the last 72 hours. Thyroid  Function Tests: No results for input(s): TSH, T4TOTAL, FREET4, T3FREE, THYROIDAB in the last 72 hours. Anemia Panel: No results for input(s): VITAMINB12, FOLATE, FERRITIN, TIBC, IRON , RETICCTPCT in the last 72 hours. Sepsis Labs: Recent Labs  Lab 10/20/23 1749  LATICACIDVEN 1.3    Recent Results (from the past 240 hours)  Culture, blood (Routine x 2)     Status: None (Preliminary result)   Collection Time: 10/20/23  5:49 PM   Specimen: BLOOD  Result Value Ref Range Status   Specimen Description BLOOD LEFT ANTECUBITAL  Final   Special Requests   Final    BOTTLES DRAWN AEROBIC AND ANAEROBIC Blood Culture adequate volume   Culture   Final    NO GROWTH < 24 HOURS Performed at [facility], [address],  [place], [place] [address]    Report Status PENDING  Incomplete  Culture, blood (Routine x 2)     Status: None (Preliminary result)   Collection Time: 10/20/23  5:49 PM   Specimen: BLOOD  Result Value Ref Range Status   Specimen Description BLOOD BLOOD RIGHT HAND  Final   Special Requests   Final    BOTTLES DRAWN AEROBIC AND ANAEROBIC Blood Culture adequate volume   Culture   Final    NO GROWTH < 24 HOURS Performed at [facility], [address], [place], [place] [address]    Report Status PENDING  Incomplete  Resp panel by RT-PCR (RSV, Flu A&B, Covid) Anterior Nasal Swab     Status: None   Collection Time: 10/20/23  6:36 PM   Specimen: Anterior Nasal  Swab  Result Value Ref Range Status   SARS Coronavirus 2 by RT PCR NEGATIVE NEGATIVE Final    Comment: (NOTE) SARS-CoV-2 target nucleic acids are NOT DETECTED.  The SARS-CoV-2 RNA is generally detectable in upper respiratory specimens during the acute phase of infection. The lowest concentration of SARS-CoV-2 viral copies this assay can detect is 138 copies/mL. A negative result does not preclude SARS-Cov-2 infection and should not be used as the sole basis for treatment or other patient management decisions. A negative result may occur with  improper specimen collection/handling, submission of specimen other than nasopharyngeal swab, presence of viral mutation(s) within the areas targeted by this assay, and inadequate number of viral copies(<138 copies/mL). A negative result must be combined with clinical observations, patient history, and epidemiological information. The expected result is Negative.  Fact Sheet for Patients:  BloggerCourse.com  Fact Sheet for Healthcare Providers:  SeriousBroker.it  This test is no t yet approved or cleared by the [place]  FDA and  has been authorized for detection and/or diagnosis of SARS-CoV-2 by FDA under an Emergency Use Authorization  (EUA). This EUA will remain  in effect (meaning this test can be used) for the duration of the COVID-19 declaration under Section 564(b)(1) of the Act, 21 U.S.C.section 360bbb-3(b)(1), unless the authorization is terminated  or revoked sooner.       Influenza A by PCR NEGATIVE NEGATIVE Final   Influenza B by PCR NEGATIVE NEGATIVE Final    Comment: (NOTE) The Xpert Xpress SARS-CoV-2/FLU/RSV plus assay is intended as an aid in the diagnosis of influenza from Nasopharyngeal swab specimens and should not be used as a sole basis for treatment. Nasal washings and aspirates are unacceptable for Xpert Xpress SARS-CoV-2/FLU/RSV testing.  Fact Sheet for Patients: BloggerCourse.com  Fact Sheet for Healthcare Providers: SeriousBroker.it  This test is not yet approved or cleared by the [place]  FDA and has been authorized for detection and/or diagnosis of SARS-CoV-2 by FDA under an Emergency Use Authorization (EUA). This EUA will remain in effect (meaning this test can be used) for the duration of the COVID-19 declaration under Section 564(b)(1) of the Act, 21 U.S.C. section 360bbb-3(b)(1), unless the authorization is terminated or revoked.     Resp Syncytial Virus by PCR NEGATIVE NEGATIVE Final    Comment: (NOTE) Fact Sheet for Patients: BloggerCourse.com  Fact Sheet for Healthcare Providers: SeriousBroker.it  This test is not yet approved or cleared by the [place]  FDA and has been authorized for detection and/or diagnosis of SARS-CoV-2 by FDA under an Emergency Use Authorization (EUA). This EUA will remain in effect (meaning this test can be used) for the duration of the COVID-19 declaration under Section 564(b)(1) of the Act, 21 U.S.C. section 360bbb-3(b)(1), unless the authorization is terminated or revoked.  Performed at [facility], [address], [place], [place]  [address]          Radiology Studies: [facility] Chest [facility] 1 View Result Date: 10/20/2023 CLINICAL DATA:  Possible sepsis.  Fever and increased urination. EXAM: PORTABLE CHEST 1 VIEW COMPARISON:  04/08/2018. FINDINGS: The heart is enlarged the mediastinal contour is within normal limits. There is atherosclerotic calcification of the aorta. No consolidation, effusion, or pneumothorax is seen. A stimulator device is noted over the right chest with lead coursing over the cervical region on the right. Cervical spinal fusion hardware is noted. No acute osseous abnormality. IMPRESSION: No active disease. Electronically Signed   By: Leita Birmingham M.D.   On: 10/20/2023 18:39  Scheduled Meds:  cholecalciferol   1,000 Units Oral q AM   cyanocobalamin   500 mcg Oral q AM   DULoxetine   60 mg Oral q AM   famotidine   40 mg Oral QHS   fluticasone  furoate-vilanterol  1 puff Inhalation Daily   insulin  aspart  0-15 Units Subcutaneous TID WC   insulin  aspart  0-5 Units Subcutaneous QHS   iron  polysaccharides  150 mg Oral Daily   levothyroxine   37.5 mcg Oral QAC breakfast   pantoprazole   40 mg Oral Daily   simvastatin   20 mg Oral QPM   traZODone   50 mg Oral QHS   Continuous Infusions:  cefTRIAXone  (ROCEPHIN )  IV       LOS: 1 day    Time spent: 55 minutes    Atwell Mcdanel JONETTA Fairly, DO [facility]  If 7PM-7AM, please contact night-coverage [email or web address] 10/21/2023, 11:26 AM

## 2023-10-21 NOTE — Progress Notes (Signed)
 Transition of Care Department [facility] has reviewed patient and no other TOC needs have been identified at this time. We will continue to monitor patient advancement through interdisciplinary progression rounds. If new patient transition needs arise, please place a TOC consult.  Pt reports nephrologist prescribed medication for incontinence and he was providing samples but he moved away. Pt is unable to afford copay for this medication as it is over $400. LCSW encouraged pt to discuss with PCP or new nephrologist to see if samples could be provided again.    10/21/23 0758  TOC Brief Assessment  Insurance and Status Reviewed  Patient has primary care physician Yes  Home environment has been reviewed Lives with husband.  Prior level of function: Independent.  Prior/Current Home Services No current home services  Social Drivers of Health Review SDOH reviewed no interventions necessary  Readmission risk has been reviewed Yes  Transition of care needs no transition of care needs at this time

## 2023-10-21 NOTE — Plan of Care (Signed)

## 2023-10-21 NOTE — Plan of Care (Signed)

## 2023-10-21 NOTE — Progress Notes (Signed)
 The lab called and was concerned in regards to patients hgb today being in the 8's and yesterdays hgb result being that of 11.3. Pt has no known reason as to why there would be such a signficiant drop in her hgb. Lab stated that they were going to have her CBC redrawn for conformation of result obtained this am to ensure it wasn't a faulty specimen by chance. This Clinical research associate did inform the MD of this via messaging. Pts vitals have been stable since she received the 500cc bolus of NS earlier in the night. No further hypotensive episodes noted. Pt has been up with assistance ambulating to the restroom to void. IV fluids continue to infuse as ordered. Pt denies pain.

## 2023-10-21 NOTE — Progress Notes (Signed)
 The lab called this writer back and stated that her previous Hgb this am was a 8.4 and upon conformation and recollection, it is now a 8.0. MD responded to previous message in regards to her drop from 11.3 to 8.4 and request that this writer inform day shift MD about this matter. This Clinical research associate will pass this along to the dayshift nurse to address with the dayshift provider. Pt appears stable and states she is feeling better. No active bleeding noted that is visible and the patient doesn't appear to have any changes in appearance upon reassessing her this am. VS are still stable.

## 2023-10-22 ENCOUNTER — Telehealth: Payer: Self-pay

## 2023-10-22 DIAGNOSIS — A4150 Gram-negative sepsis, unspecified: Secondary | ICD-10-CM | POA: Diagnosis not present

## 2023-10-22 DIAGNOSIS — N390 Urinary tract infection, site not specified: Secondary | ICD-10-CM | POA: Diagnosis not present

## 2023-10-22 LAB — OCCULT BLOOD X 1 CARD TO LAB, STOOL: Fecal Occult Bld: NEGATIVE

## 2023-10-22 LAB — CBC
HCT: 23.8 % — ABNORMAL LOW (ref 36.0–46.0)
Hemoglobin: 7.7 g/dL — ABNORMAL LOW (ref 12.0–15.0)
MCH: 31.6 pg (ref 26.0–34.0)
MCHC: 32.4 g/dL (ref 30.0–36.0)
MCV: 97.5 fL (ref 80.0–100.0)
Platelets: 180 K/uL (ref 150–400)
RBC: 2.44 MIL/uL — ABNORMAL LOW (ref 3.87–5.11)
RDW: 12.9 % (ref 11.5–15.5)
WBC: 9.8 K/uL (ref 4.0–10.5)
nRBC: 0 % (ref 0.0–0.2)

## 2023-10-22 LAB — GLUCOSE, CAPILLARY
Glucose-Capillary: 102 mg/dL — ABNORMAL HIGH (ref 70–99)
Glucose-Capillary: 108 mg/dL — ABNORMAL HIGH (ref 70–99)
Glucose-Capillary: 102 mg/dL — ABNORMAL HIGH (ref 70–99)

## 2023-10-22 LAB — BASIC METABOLIC PANEL WITH GFR
Anion gap: 7 (ref 5–15)
BUN: 11 mg/dL (ref 8–23)
CO2: 23 mmol/L (ref 22–32)
Calcium: 8.2 mg/dL — ABNORMAL LOW (ref 8.9–10.3)
Chloride: 106 mmol/L (ref 98–111)
Creatinine, Ser: 1.06 mg/dL — ABNORMAL HIGH (ref 0.44–1.00)
GFR, Estimated: 55 mL/min — ABNORMAL LOW (ref >60)
Glucose, Bld: 105 mg/dL — ABNORMAL HIGH (ref 70–99)
Potassium: 3.7 mmol/L (ref 3.5–5.1)
Sodium: 136 mmol/L (ref 135–145)

## 2023-10-22 LAB — MAGNESIUM: Magnesium: 1.6 mg/dL — ABNORMAL LOW (ref 1.7–2.4)

## 2023-10-22 LAB — HEMOGLOBIN AND HEMATOCRIT, BLOOD
HCT: 24.4 % — ABNORMAL LOW (ref 36.0–46.0)
Hemoglobin: 8 g/dL — ABNORMAL LOW (ref 12.0–15.0)

## 2023-10-22 LAB — HEMOGLOBIN A1C
Hgb A1c MFr Bld: 6.1 % — ABNORMAL HIGH (ref 4.8–5.6)
Mean Plasma Glucose: 128 mg/dL

## 2023-10-22 MED ORDER — BISACODYL 10 MG RE SUPP
10.0000 mg | Freq: Once | RECTAL | Status: AC
  Administered 2023-10-22: 10 mg via RECTAL
  Filled 2023-10-22: qty 1

## 2023-10-22 MED ORDER — CEFDINIR 300 MG PO CAPS: 300.0000 mg | ORAL_CAPSULE | Freq: Two times a day (BID) | ORAL | 0 refills | Status: AC

## 2023-10-22 MED ORDER — MAGNESIUM SULFATE 2 GM/50ML IV SOLN
2.0000 g | Freq: Once | INTRAVENOUS | Status: AC
  Administered 2023-10-22: 2 g via INTRAVENOUS
  Filled 2023-10-22: qty 50

## 2023-10-22 MED ORDER — SODIUM CHLORIDE 0.9 % IV SOLN
200.0000 mg | Freq: Once | INTRAVENOUS | Status: AC
  Administered 2023-10-22: 200 mg via INTRAVENOUS
  Filled 2023-10-22: qty 10

## 2023-10-22 MED ORDER — ALBUTEROL SULFATE HFA 108 (90 BASE) MCG/ACT IN AERS: 2.0000 | INHALATION_SPRAY | Freq: Four times a day (QID) | RESPIRATORY_TRACT | 1 refills | Status: AC | PRN

## 2023-10-22 MED ORDER — SENNOSIDES-DOCUSATE SODIUM 8.6-50 MG PO TABS: 1.0000 | ORAL_TABLET | Freq: Every day | ORAL | 1 refills | Status: AC

## 2023-10-22 NOTE — Plan of Care (Signed)

## 2023-10-22 NOTE — Progress Notes (Signed)
 Mobility Specialist Progress Note:    10/22/23 0954  Mobility  Activity Ambulated with assistance  Level of Assistance Modified independent, requires aide device or extra time  Assistive Device None  Distance Ambulated (ft) 100 ft  Range of Motion/Exercises Active;All extremities  Activity Response Tolerated well  Mobility Referral Yes  Mobility visit 1 Mobility  Mobility Specialist Start Time (ACUTE ONLY) [id number]  Mobility Specialist Stop Time (ACUTE ONLY) 0954  Mobility Specialist Time Calculation (min) (ACUTE ONLY) 16 min   Pt received in bed, agreeable to mobility. ModI to stand and ambulate with no AD. Tolerated well, asx throughout. Returned supine, all needs met.   Sherrilee Ditty Mobility Specialist Please contact via Special educational needs teacher or  Rehab office at [phone]

## 2023-10-22 NOTE — Progress Notes (Signed)
 Patient states understanding of discharge instructions.

## 2023-10-22 NOTE — Progress Notes (Signed)
 Referral sent for outpatient iron  replacement.   Dempsey Blush PharmD., BCPS Clinical Pharmacist 10/22/2023 8:10 AM

## 2023-10-22 NOTE — Plan of Care (Signed)

## 2023-10-22 NOTE — Telephone Encounter (Signed)
 Patient referred to infusion pharmacy team for ambulatory infusion of IV iron .  Insurance - Medicare Site of care - Site of care: [facility] Dx code - D50.9 IV Iron  Therapy - Feraheme 510 mg x 2 Infusion appointments - Scheduling team will schedule patient as soon as possible.    Norton Blush, PharmD Pharmacist [facility]

## 2023-10-22 NOTE — Discharge Summary (Signed)
 Physician Discharge Summary  TOI STELLY FMW:[id number] DOB: [birth year] DOA: 10/20/2023  PCP: Kip Righter, MD  Admit date: 10/20/2023  Discharge date: 10/22/2023  Admitted From:Home  Disposition:  Home  Recommendations for Outpatient Follow-up:  Follow up with PCP in 1-2 weeks, monitor CBC in 1 week Follow-up with GI outpatient for colonoscopy and schedule per PCP Continue home iron  supplementation and further IV infusions which will be set up outpatient Continue Omnicef  as prescribed for UTI based on prior sensitivities to finish course of treatment Continue other home medications as prior  Home Health: None  Equipment/Devices: None  Discharge Condition:Stable  CODE STATUS: Full  Diet recommendation: Heart Healthy/carb modified  Brief/Interim Summary: Heather Espinoza is a 78 y.o. Caucasian female with medical history significant for COPD, osteoarthritis, type 2 diabetes mellitus with diabetic neuropathy, GERD, essential tremors, and dyslipidemia, presented to the emergency room with acute onset of generalized weakness and high fever of 102.8 with chills and excessive sleepiness today.  Patient was admitted with sepsis, POA secondary to UTI.  She has noted to have growth of gram-negative rods and has previously had E. coli and will be discharged on cefdinir  today based on prior sensitivities.  She is overall doing well, but does have worsening anemia related to iron  deficiency and received a dose of IV Venofer  during this hospitalization and further doses will be scheduled outpatient.  No overt bleeding or occult bleeding noted in stool studies.  No other acute events or concerns noted.  Discharge Diagnoses:  Principal Problem:   Sepsis due to gram-negative UTI [facility]) Active Problems:   Type 2 diabetes mellitus with peripheral neuropathy (HCC)   Hypothyroidism   Depression   Chronic obstructive pulmonary disease (COPD) (HCC)   Dyslipidemia   GERD without esophagitis  Principal  discharge diagnosis: Sepsis, POA due to gram-negative rod UTI suspect E. coli.  Worsening anemia of iron  deficiency with no overt or occult bleeding noted.  Discharge Instructions  Discharge Instructions     Amb Referral to Intravenous Iron  Therapy   Complete by: As directed    You have been referred to [facility] Infusion team for IV Iron  Infusions. The infusion pharmacy team will reach out to you with appointment information.    Primary Diagnosis Code for IV Iron : D50.9 - Iron  deficiency Anemia   Secondary diagnosis code for IV iron : N18.x - CKD   Diet - low sodium heart healthy   Complete by: As directed    Increase activity slowly   Complete by: As directed       Allergies as of 10/22/2023       Reactions   Flagyl [metronidazole] Shortness Of Breath, Swelling   Lyrica [pregabalin] Shortness Of Breath   Adhesive [tape] Other (See Comments)   Takes off patient's skin   Aspirin  Other (See Comments)   Stomach bleed   Latex Itching, Rash   Phenergan [promethazine Hcl] Other (See Comments)   Caused grogginess, altered mental status   Prednisone Anxiety   Sulfa Antibiotics Nausea Only   Tetanus Toxoids Swelling   Arm Area   Wound Dressing Adhesive Rash        Medication List     TAKE these medications    albuterol  108 (90 Base) MCG/ACT inhaler Commonly known as: VENTOLIN  HFA Inhale 2 puffs into the lungs every 6 (six) hours as needed for wheezing or shortness of breath.   aspirin  EC 81 MG tablet Take 81 mg by mouth every 6 (six) hours as needed for  moderate pain (pain score 4-6). Swallow whole.   B-12 500 MCG Tabs Take 500 mcg by mouth in the morning.   cefdinir  300 MG capsule Commonly known as: OMNICEF  Take 1 capsule (300 mg total) by mouth 2 (two) times daily for 2 days.   cyclobenzaprine  5 MG tablet Commonly known as: FLEXERIL  Take 1 tablet (5 mg total) by mouth 3 (three) times daily as needed for muscle spasms.   diclofenac Sodium 1 % Gel Commonly known  as: VOLTAREN Apply 4 g topically 4 (four) times daily.   dicyclomine  10 MG capsule Commonly known as: BENTYL  TAKE 1 CAPSULE BY MOUTH THREE TIMES DAILY BEFORE MEAL(S)   docusate sodium  100 MG capsule Commonly known as: Colace Take 2 capsules (200 mg total) by mouth at bedtime. What changed:  when to take this reasons to take this   DULoxetine  60 MG capsule Commonly known as: CYMBALTA  Take 60 mg by mouth in the morning.   famotidine  40 MG tablet Commonly known as: PEPCID  TAKE 1 TABLET BY MOUTH AT BEDTIME   Ferrex 150 150 MG capsule Generic drug: iron  polysaccharides Take 1 capsule by mouth once daily   Fluticasone -Salmeterol 250-50 MCG/DOSE Aepb Commonly known as: ADVAIR Inhale 1 puff into the lungs every 12 (twelve) hours.   gabapentin  100 MG capsule Commonly known as: NEURONTIN  Take 100 mg by mouth daily.   HAIR SKIN & NAILS GUMMIES PO Take 2 tablets by mouth in the morning and at bedtime.   levothyroxine  75 MCG tablet Commonly known as: SYNTHROID  Take 37.5 mcg by mouth daily before breakfast.   lisinopril  10 MG tablet Commonly known as: ZESTRIL  Take 10 mg by mouth in the morning.   loperamide 2 MG tablet Commonly known as: IMODIUM A-D Take 2 mg by mouth 4 (four) times daily as needed for diarrhea or loose stools.   metFORMIN  500 MG 24 hr tablet Commonly known as: GLUCOPHAGE -XR Take 500 mg by mouth daily before supper.   neomycin-bacitracin -polymyxin [id number] ointment Commonly known as: NEOSPORIN Apply 1 application topically as needed for wound care.   omeprazole  40 MG capsule Commonly known as: PRILOSEC Take 1 capsule by mouth once daily   ondansetron  4 MG tablet Commonly known as: ZOFRAN  TAKE 1 TABLET BY MOUTH EVERY 8 HOURS AS NEEDED FOR NAUSEA OR VOMITING   senna-docusate 8.6-50 MG tablet Commonly known as: Senokot-S Take 1 tablet by mouth at bedtime.   simvastatin  20 MG tablet Commonly known as: ZOCOR  Take 20 mg by mouth every evening.    traMADol  50 MG tablet Commonly known as: ULTRAM  Take 50 mg by mouth every 6 (six) hours as needed for severe pain (pain score 7-10).   traZODone  100 MG tablet Commonly known as: DESYREL  Take 50 mg by mouth at bedtime.   Vitamin D -3 25 MCG (1000 UT) Caps Take 1,000 Units by mouth in the morning.        Follow-up Information     Kip Righter, MD. Schedule an appointment as soon as possible for a visit in 1 week(s).   Specialty: Family Medicine Contact information: [address] [place] [address] [phone]                Allergies  Allergen Reactions   Flagyl [Metronidazole] Shortness Of Breath and Swelling   Lyrica [Pregabalin] Shortness Of Breath   Adhesive [Tape] Other (See Comments)    Takes off patient's skin   Aspirin  Other (See Comments)    Stomach bleed   Latex  Itching and Rash   Phenergan [Promethazine Hcl] Other (See Comments)    Caused grogginess, altered mental status   Prednisone Anxiety   Sulfa Antibiotics Nausea Only   Tetanus Toxoids Swelling    Arm Area   Wound Dressing Adhesive Rash    Consultations: None   Procedures/Studies: DG Chest Port 1 View Result Date: 10/20/2023 CLINICAL DATA:  Possible sepsis.  Fever and increased urination. EXAM: PORTABLE CHEST 1 VIEW COMPARISON:  04/08/2018. FINDINGS: The heart is enlarged the mediastinal contour is within normal limits. There is atherosclerotic calcification of the aorta. No consolidation, effusion, or pneumothorax is seen. A stimulator device is noted over the right chest with lead coursing over the cervical region on the right. Cervical spinal fusion hardware is noted. No acute osseous abnormality. IMPRESSION: No active disease. Electronically Signed   By: Leita Birmingham M.D.   On: 10/20/2023 18:39     Discharge Exam: Vitals:   10/22/23 0710 10/22/23 1216  BP:  109/61  Pulse:  73  Resp:  18  Temp:  98.3 F (36.8 C)  SpO2: 98% 99%   Vitals:   10/21/23 2022  10/22/23 0556 10/22/23 0710 10/22/23 1216  BP: (!) 123/59 (!) 100/55  109/61  Pulse: 91 72  73  Resp: 18 17  18   Temp: 100 F (37.8 C) 98.2 F (36.8 C)  98.3 F (36.8 C)  TempSrc:    Oral  SpO2: 96% 97% 98% 99%  Weight:      Height:        General: Pt is alert, awake, not in acute distress Cardiovascular: RRR, S1/S2 +, no rubs, no gallops Respiratory: CTA bilaterally, no wheezing, no rhonchi Abdominal: Soft, NT, ND, bowel sounds + Extremities: no edema, no cyanosis    The results of significant diagnostics from this hospitalization (including imaging, microbiology, ancillary and laboratory) are listed below for reference.     Microbiology: Recent Results (from the past 240 hours)  Culture, blood (Routine x 2)     Status: None (Preliminary result)   Collection Time: 10/20/23  5:49 PM   Specimen: BLOOD  Result Value Ref Range Status   Specimen Description BLOOD LEFT ANTECUBITAL  Final   Special Requests   Final    BOTTLES DRAWN AEROBIC AND ANAEROBIC Blood Culture adequate volume   Culture   Final    NO GROWTH 2 DAYS Performed at [facility], [address], [place], [place] [address]    Report Status PENDING  Incomplete  Culture, blood (Routine x 2)     Status: None (Preliminary result)   Collection Time: 10/20/23  5:49 PM   Specimen: BLOOD  Result Value Ref Range Status   Specimen Description BLOOD BLOOD RIGHT HAND  Final   Special Requests   Final    BOTTLES DRAWN AEROBIC AND ANAEROBIC Blood Culture adequate volume   Culture   Final    NO GROWTH 2 DAYS Performed at [facility], [address], [place], [place] [address]    Report Status PENDING  Incomplete  Resp panel by RT-PCR (RSV, Flu A&B, Covid) Anterior Nasal Swab     Status: None   Collection Time: 10/20/23  6:36 PM   Specimen: Anterior Nasal Swab  Result Value Ref Range Status   SARS Coronavirus 2 by RT PCR NEGATIVE NEGATIVE Final    Comment: (NOTE) SARS-CoV-2 target nucleic acids are NOT  DETECTED.  The SARS-CoV-2 RNA is generally detectable in upper respiratory specimens during the acute phase of infection. The lowest  concentration of SARS-CoV-2 viral copies this assay can detect is 138 copies/mL. A negative result does not preclude SARS-Cov-2 infection and should not be used as the sole basis for treatment or other patient management decisions. A negative result may occur with  improper specimen collection/handling, submission of specimen other than nasopharyngeal swab, presence of viral mutation(s) within the areas targeted by this assay, and inadequate number of viral copies(<138 copies/mL). A negative result must be combined with clinical observations, patient history, and epidemiological information. The expected result is Negative.  Fact Sheet for Patients:  BloggerCourse.com  Fact Sheet for Healthcare Providers:  SeriousBroker.it  This test is no t yet approved or cleared by the [place]  FDA and  has been authorized for detection and/or diagnosis of SARS-CoV-2 by FDA under an Emergency Use Authorization (EUA). This EUA will remain  in effect (meaning this test can be used) for the duration of the COVID-19 declaration under Section 564(b)(1) of the Act, 21 U.S.C.section 360bbb-3(b)(1), unless the authorization is terminated  or revoked sooner.       Influenza A by PCR NEGATIVE NEGATIVE Final   Influenza B by PCR NEGATIVE NEGATIVE Final    Comment: (NOTE) The Xpert Xpress SARS-CoV-2/FLU/RSV plus assay is intended as an aid in the diagnosis of influenza from Nasopharyngeal swab specimens and should not be used as a sole basis for treatment. Nasal washings and aspirates are unacceptable for Xpert Xpress SARS-CoV-2/FLU/RSV testing.  Fact Sheet for Patients: BloggerCourse.com  Fact Sheet for Healthcare Providers: SeriousBroker.it  This test is not yet  approved or cleared by the [place]  FDA and has been authorized for detection and/or diagnosis of SARS-CoV-2 by FDA under an Emergency Use Authorization (EUA). This EUA will remain in effect (meaning this test can be used) for the duration of the COVID-19 declaration under Section 564(b)(1) of the Act, 21 U.S.C. section 360bbb-3(b)(1), unless the authorization is terminated or revoked.     Resp Syncytial Virus by PCR NEGATIVE NEGATIVE Final    Comment: (NOTE) Fact Sheet for Patients: BloggerCourse.com  Fact Sheet for Healthcare Providers: SeriousBroker.it  This test is not yet approved or cleared by the [place]  FDA and has been authorized for detection and/or diagnosis of SARS-CoV-2 by FDA under an Emergency Use Authorization (EUA). This EUA will remain in effect (meaning this test can be used) for the duration of the COVID-19 declaration under Section 564(b)(1) of the Act, 21 U.S.C. section 360bbb-3(b)(1), unless the authorization is terminated or revoked.  Performed at [facility], [address], [place], [place] [address]   Urine Culture     Status: Abnormal (Preliminary result)   Collection Time: 10/20/23  6:36 PM   Specimen: Urine, Random  Result Value Ref Range Status   Specimen Description   Final    URINE, RANDOM Performed at [facility], [address], [place], [place] [address]    Special Requests   Final    NONE Reflexed from [id number] Performed at [facility], [address], [place], [place] [address]    Culture (A)  Final    >=100,000 COLONIES/mL ESCHERICHIA COLI SUSCEPTIBILITIES TO FOLLOW Performed at [facility], [address], [place], [place] [address]    Report Status PENDING  Incomplete     Labs: BNP (last 3 results) No results for input(s): BNP in the last 8760 hours. Basic Metabolic Panel: Recent Labs  Lab 10/20/23 1749 10/21/23 0414 10/22/23 0443  NA 138 139 136  K 3.8  3.7 3.7  CL  106 112* 106  CO2 19* 20* 23  GLUCOSE 139* 126* 105*  BUN 16 15 11   CREATININE 1.32* 1.14* 1.06*  CALCIUM 9.1 7.8* 8.2*  MG  --   --  1.6*   Liver Function Tests: Recent Labs  Lab 10/20/23 1749  AST 25  ALT 18  ALKPHOS 42  BILITOT 0.8  PROT 7.4  ALBUMIN 3.8   No results for input(s): LIPASE, AMYLASE in the last 168 hours. No results for input(s): AMMONIA in the last 168 hours. CBC: Recent Labs  Lab 10/20/23 1749 10/21/23 0540 10/21/23 0928 10/22/23 0443 10/22/23 1330  WBC 16.9* 10.3  --  9.8  --   NEUTROABS 12.5*  --   --   --   --   HGB 11.3* 8.0* 8.1* 7.7* 8.0*  HCT 33.7* 24.9* 24.9* 23.8* 24.4*  MCV 95.7 99.2  --  97.5  --   PLT 293 179  --  180  --    Cardiac Enzymes: No results for input(s): CKTOTAL, CKMB, CKMBINDEX, TROPONINI in the last 168 hours. BNP: Invalid input(s): POCBNP CBG: Recent Labs  Lab 10/21/23 1116 10/21/23 1605 10/21/23 2021 10/22/23 0722 10/22/23 1114  GLUCAP 136* 94 98 102* 108*   D-Dimer No results for input(s): DDIMER in the last 72 hours. Hgb A1c Recent Labs    10/21/23 0414  HGBA1C 6.1*   Lipid Profile No results for input(s): CHOL, HDL, LDLCALC, TRIG, CHOLHDL, LDLDIRECT in the last 72 hours. Thyroid  function studies No results for input(s): TSH, T4TOTAL, T3FREE, THYROIDAB in the last 72 hours.  Invalid input(s): FREET3 Anemia work up Recent Labs    10/21/23 0414 10/21/23 1037  VITAMINB12  --  449  FOLATE  --  12.0  FERRITIN  --  58  TIBC  --  286  IRON   --  6*  RETICCTPCT 1.3  --    Urinalysis    Component Value Date/Time   COLORURINE YELLOW 10/20/2023 1836   APPEARANCEUR CLOUDY (A) 10/20/2023 1836   APPEARANCEUR Clear 01/04/2022 1424   LABSPEC 1.017 10/20/2023 1836   PHURINE 6.0 10/20/2023 1836   GLUCOSEU NEGATIVE 10/20/2023 1836   HGBUR MODERATE (A) 10/20/2023 1836   BILIRUBINUR NEGATIVE 10/20/2023 1836   BILIRUBINUR Negative 01/04/2022 1424    KETONESUR NEGATIVE 10/20/2023 1836   PROTEINUR 100 (A) 10/20/2023 1836   NITRITE POSITIVE (A) 10/20/2023 1836   LEUKOCYTESUR LARGE (A) 10/20/2023 1836   Sepsis Labs Recent Labs  Lab 10/20/23 1749 10/21/23 0540 10/22/23 0443  WBC 16.9* 10.3 9.8   Microbiology Recent Results (from the past 240 hours)  Culture, blood (Routine x 2)     Status: None (Preliminary result)   Collection Time: 10/20/23  5:49 PM   Specimen: BLOOD  Result Value Ref Range Status   Specimen Description BLOOD LEFT ANTECUBITAL  Final   Special Requests   Final    BOTTLES DRAWN AEROBIC AND ANAEROBIC Blood Culture adequate volume   Culture   Final    NO GROWTH 2 DAYS Performed at [facility], [address] [place], [place] [address]    Report Status PENDING  Incomplete  Culture, blood (Routine x 2)     Status: None (Preliminary result)   Collection Time: 10/20/23  5:49 PM   Specimen: BLOOD  Result Value Ref Range Status   Specimen Description BLOOD BLOOD RIGHT HAND  Final   Special Requests   Final    BOTTLES DRAWN AEROBIC AND ANAEROBIC Blood Culture adequate volume   Culture  Final    NO GROWTH 2 DAYS Performed at [facility], [address] [place], [place] [address]    Report Status PENDING  Incomplete  Resp panel by RT-PCR (RSV, Flu A&B, Covid) Anterior Nasal Swab     Status: None   Collection Time: 10/20/23  6:36 PM   Specimen: Anterior Nasal Swab  Result Value Ref Range Status   SARS Coronavirus 2 by RT PCR NEGATIVE NEGATIVE Final    Comment: (NOTE) SARS-CoV-2 target nucleic acids are NOT DETECTED.  The SARS-CoV-2 RNA is generally detectable in upper respiratory specimens during the acute phase of infection. The lowest concentration of SARS-CoV-2 viral copies this assay can detect is 138 copies/mL. A negative result does not preclude SARS-Cov-2 infection and should not be used as the sole basis for treatment or other patient management decisions. A negative result may occur with  improper  specimen collection/handling, submission of specimen other than nasopharyngeal swab, presence of viral mutation(s) within the areas targeted by this assay, and inadequate number of viral copies(<138 copies/mL). A negative result must be combined with clinical observations, patient history, and epidemiological information. The expected result is Negative.  Fact Sheet for Patients:  BloggerCourse.com  Fact Sheet for Healthcare Providers:  SeriousBroker.it  This test is no t yet approved or cleared by the [place]  FDA and  has been authorized for detection and/or diagnosis of SARS-CoV-2 by FDA under an Emergency Use Authorization (EUA). This EUA will remain  in effect (meaning this test can be used) for the duration of the COVID-19 declaration under Section 564(b)(1) of the Act, 21 U.S.C.section 360bbb-3(b)(1), unless the authorization is terminated  or revoked sooner.       Influenza A by PCR NEGATIVE NEGATIVE Final   Influenza B by PCR NEGATIVE NEGATIVE Final    Comment: (NOTE) The Xpert Xpress SARS-CoV-2/FLU/RSV plus assay is intended as an aid in the diagnosis of influenza from Nasopharyngeal swab specimens and should not be used as a sole basis for treatment. Nasal washings and aspirates are unacceptable for Xpert Xpress SARS-CoV-2/FLU/RSV testing.  Fact Sheet for Patients: BloggerCourse.com  Fact Sheet for Healthcare Providers: SeriousBroker.it  This test is not yet approved or cleared by the [place]  FDA and has been authorized for detection and/or diagnosis of SARS-CoV-2 by FDA under an Emergency Use Authorization (EUA). This EUA will remain in effect (meaning this test can be used) for the duration of the COVID-19 declaration under Section 564(b)(1) of the Act, 21 U.S.C. section 360bbb-3(b)(1), unless the authorization is terminated or revoked.     Resp  Syncytial Virus by PCR NEGATIVE NEGATIVE Final    Comment: (NOTE) Fact Sheet for Patients: BloggerCourse.com  Fact Sheet for Healthcare Providers: SeriousBroker.it  This test is not yet approved or cleared by the [place]  FDA and has been authorized for detection and/or diagnosis of SARS-CoV-2 by FDA under an Emergency Use Authorization (EUA). This EUA will remain in effect (meaning this test can be used) for the duration of the COVID-19 declaration under Section 564(b)(1) of the Act, 21 U.S.C. section 360bbb-3(b)(1), unless the authorization is terminated or revoked.  Performed at [facility], [address] [place], [place] [address]   Urine Culture     Status: Abnormal (Preliminary result)   Collection Time: 10/20/23  6:36 PM   Specimen: Urine, Random  Result Value Ref Range Status   Specimen Description   Final    URINE, RANDOM Performed at [facility], [address] [place], [place] [address]  Special Requests   Final    NONE Reflexed from [id number] Performed at [facility], [address] [place], [place] [address]    Culture (A)  Final    >=100,000 COLONIES/mL ESCHERICHIA COLI SUSCEPTIBILITIES TO FOLLOW Performed at [facility], [address], [place], [place] [address]    Report Status PENDING  Incomplete     Time coordinating discharge: 35 minutes  SIGNED:   Adron JONETTA Fairly, DO [facility] 10/22/2023, 3:01 PM  If 7PM-7AM, please contact night-coverage [email or web address]

## 2023-10-22 NOTE — Telephone Encounter (Signed)
 Auth Submission: NO AUTH NEEDED Site of care: Site of care: [facility] Payer: medicare a/b, generic supp Medication & CPT/J Code(s) submitted: Feraheme (ferumoxytol) [id number] Diagnosis Code:  Route of submission (phone, fax, portal): phone Phone # Fax # Auth type: Buy/Bill HB Units/visits requested: 510mg  x 2 doses Reference number:  Approval from: 10/22/23 to 03/18/24

## 2023-10-23 LAB — HEMOGLOBIN A1C
Hgb A1c MFr Bld: 6.1 % — ABNORMAL HIGH (ref 4.8–5.6)
Mean Plasma Glucose: 128 mg/dL

## 2023-10-23 LAB — URINE CULTURE: Culture: >=100000 — AB

## 2023-10-25 LAB — CULTURE, BLOOD (ROUTINE X 2)
Culture: NO GROWTH
Culture: NO GROWTH
Special Requests: ADEQUATE
Special Requests: ADEQUATE

## 2023-10-29 ENCOUNTER — Other Ambulatory Visit (INDEPENDENT_AMBULATORY_CARE_PROVIDER_SITE_OTHER): Payer: Self-pay | Admitting: Gastroenterology

## 2023-10-29 ENCOUNTER — Other Ambulatory Visit (HOSPITAL_COMMUNITY): Payer: Self-pay | Admitting: Family Medicine

## 2023-10-29 DIAGNOSIS — Z1231 Encounter for screening mammogram for malignant neoplasm of breast: Secondary | ICD-10-CM

## 2023-10-29 DIAGNOSIS — K582 Mixed irritable bowel syndrome: Secondary | ICD-10-CM

## 2023-10-31 ENCOUNTER — Encounter: Attending: Pulmonary Disease | Admitting: *Deleted

## 2023-10-31 VITALS — BP 113/66 | HR 65 | Temp 97.7°F | Resp 16

## 2023-10-31 DIAGNOSIS — D509 Iron deficiency anemia, unspecified: Secondary | ICD-10-CM | POA: Insufficient documentation

## 2023-10-31 MED ORDER — SODIUM CHLORIDE 0.9 % IV SOLN
510.0000 mg | Freq: Once | INTRAVENOUS | Status: AC
  Administered 2023-10-31: 510 mg via INTRAVENOUS
  Filled 2023-10-31: qty 17

## 2023-10-31 MED ORDER — ACETAMINOPHEN 325 MG PO TABS
650.0000 mg | ORAL_TABLET | Freq: Once | ORAL | Status: AC
  Administered 2023-10-31: 650 mg via ORAL

## 2023-10-31 MED ORDER — DIPHENHYDRAMINE HCL 25 MG PO CAPS
25.0000 mg | ORAL_CAPSULE | Freq: Once | ORAL | Status: AC
  Administered 2023-10-31: 25 mg via ORAL

## 2023-10-31 NOTE — Progress Notes (Signed)
 Diagnosis: Iron  Deficiency Anemia  Provider:  Adron Fairly, DO  Procedure: IV Infusion  IV Type: Peripheral, IV Location: L Antecubital  Feraheme (Ferumoxytol ), Dose: 510 mg  Infusion Start Time: 1355  Infusion Stop Time: 1410  Post Infusion IV Care: Observation period completed  Discharge: Condition: Good, Destination: Home . AVS Provided  Performed by:  Baldwin Darice Helling, RN

## 2023-11-01 ENCOUNTER — Other Ambulatory Visit (INDEPENDENT_AMBULATORY_CARE_PROVIDER_SITE_OTHER): Payer: Self-pay | Admitting: Gastroenterology

## 2023-11-03 ENCOUNTER — Other Ambulatory Visit (INDEPENDENT_AMBULATORY_CARE_PROVIDER_SITE_OTHER): Payer: Self-pay | Admitting: Gastroenterology

## 2023-11-03 DIAGNOSIS — K582 Mixed irritable bowel syndrome: Secondary | ICD-10-CM

## 2023-11-08 ENCOUNTER — Encounter: Admitting: Emergency Medicine

## 2023-11-08 VITALS — BP 100/45 | HR 62 | Temp 97.8°F | Resp 16

## 2023-11-08 DIAGNOSIS — D509 Iron deficiency anemia, unspecified: Secondary | ICD-10-CM | POA: Diagnosis not present

## 2023-11-08 MED ORDER — DIPHENHYDRAMINE HCL 25 MG PO CAPS
25.0000 mg | ORAL_CAPSULE | Freq: Once | ORAL | Status: AC
  Administered 2023-11-08: 25 mg via ORAL

## 2023-11-08 MED ORDER — SODIUM CHLORIDE 0.9 % IV SOLN
510.0000 mg | Freq: Once | INTRAVENOUS | Status: AC
  Administered 2023-11-08: 510 mg via INTRAVENOUS
  Filled 2023-11-08: qty 17

## 2023-11-08 MED ORDER — ACETAMINOPHEN 325 MG PO TABS
650.0000 mg | ORAL_TABLET | Freq: Once | ORAL | Status: AC
  Administered 2023-11-08: 650 mg via ORAL

## 2023-11-08 NOTE — Progress Notes (Signed)
 Diagnosis: Iron  Deficiency Anemia  Provider:  Mannam, Praveen MD  Procedure: IV Infusion  IV Type: Peripheral, IV Location: L Antecubital  Feraheme (Ferumoxytol ), Dose: 510 mg  Infusion Start Time: 1324  Infusion Stop Time: 1340  Post Infusion IV Care: Observation period completed and Peripheral IV Discontinued  Discharge: Condition: Good, Destination: Home . AVS Declined  Performed by:  Delon ONEIDA Officer, RN

## 2023-11-13 ENCOUNTER — Other Ambulatory Visit (INDEPENDENT_AMBULATORY_CARE_PROVIDER_SITE_OTHER): Payer: Self-pay | Admitting: Gastroenterology

## 2023-11-15 ENCOUNTER — Ambulatory Visit (HOSPITAL_COMMUNITY)
Admission: RE | Admit: 2023-11-15 | Discharge: 2023-11-15 | Disposition: A | Discharge status: Home / Self Care | Source: Ambulatory Visit | Attending: Family Medicine | Admitting: Family Medicine

## 2023-11-15 DIAGNOSIS — Z1231 Encounter for screening mammogram for malignant neoplasm of breast: Secondary | ICD-10-CM | POA: Insufficient documentation

## 2023-11-16 ENCOUNTER — Other Ambulatory Visit (INDEPENDENT_AMBULATORY_CARE_PROVIDER_SITE_OTHER): Payer: Self-pay | Admitting: Gastroenterology

## 2023-12-05 ENCOUNTER — Encounter (INDEPENDENT_AMBULATORY_CARE_PROVIDER_SITE_OTHER): Payer: Self-pay | Admitting: Gastroenterology

## 2023-12-05 ENCOUNTER — Ambulatory Visit (INDEPENDENT_AMBULATORY_CARE_PROVIDER_SITE_OTHER): Admitting: Gastroenterology

## 2023-12-05 VITALS — BP 119/66 | HR 79 | Temp 97.3°F | Ht 61.5 in | Wt 123.5 lb

## 2023-12-05 DIAGNOSIS — D509 Iron deficiency anemia, unspecified: Secondary | ICD-10-CM

## 2023-12-05 DIAGNOSIS — K219 Gastro-esophageal reflux disease without esophagitis: Secondary | ICD-10-CM

## 2023-12-05 DIAGNOSIS — K582 Mixed irritable bowel syndrome: Secondary | ICD-10-CM

## 2023-12-05 NOTE — Patient Instructions (Addendum)
-  repeat CBC, Iron  studies -I will refer you to the blood doctor for any further iron  infusions you may need  -will need to consider updating colonoscopy/capsule study if Iron  deficiency persists -increase omeprazole  40mg  to twice daily, continue famotidine  40mg  at bedtime -continue bentyl  as needed for abdominal pain -contine sennokot for constipation   Follow up 2 months  It was a pleasure to see you today. I want to create trusting relationships with patients and provide genuine, compassionate, and quality care. I truly value your feedback! please be on the lookout for a survey regarding your visit with me today. I appreciate your input about our visit and your time in completing this!    Ercilia Bettinger L. Aliz Meritt, MSN, APRN, AGNP-C Adult-Gerontology Nurse Practitioner [facility] at [facility]

## 2023-12-05 NOTE — Progress Notes (Addendum)
 Referring Provider: Kip Righter, MD Primary Care Physician:  Kip Righter, MD Primary GI Physician: Dr. Eartha   Chief Complaint  Patient presents with   Anemia    Pt arrives to discuss hospital follow up and iron  deficiency anemia. Pt was at [facility] 10/20/23-10/22/23. Pt states she took Vit D for 8 weeks. Pt states she has IBS and diverticulitis. Pt states she either has constipation or diarrhea. No blood in stool per patient.    HPI:   Heather Espinoza is a 78 y.o. female with past medical history of anemia, COPD, DM, diverticulitis, Fibromyalgia, high cholesterol, HTN, IBS, osteopenia, RLS, IDA  Patient presenting today for:  Follow up of GERD, IBS and IDA  Last seen July 2024, at that time nausea and vomiting had worsened. Having epigastric pain, occasional dysphagia. Taking nexium  40mg  daily, famotidine  40mg  at bedtime. Having mix of constipation and diarrhea. Taking dicyclomine  10mg  TID, 2-3 BMs per day, not taking iron  pills much anymore as they caused diarrhea  Recommended to schedule EGD, start niferex 150mg  daily, continue omeprazole  40mg  daily and famotidine  40mg  daily, continue bentyl  10mg  up to TID PRN, zofran  q8h PRN  Present:  Most recent labs on 8/4 and 8/5 with hgb 8, FOBT negative, ferritin 58, iron  6 TIBC 286 saturation 2 folate 12 B12 449  States she had neck surgery in November. She reports in December she began feeling bad and ended up being flown to [facility] for sepsis. Most recent admission in August with urosepsis again. She has upcoming follow up with DR. Mackenzie. She notes she has no urinary symptoms when this is occurring.   Has had 2 iron  infusions thus far, one on 8/14 and one on 8/22. Has not felt much improvement thus far. Continues to have fatigue, low energy. She is not taking iron  pills. She denies any rectal bleeding or melena. Has not seen hematology in the past. She does not think she has any further iron  infusion scheduled. She has not tolerated  PO iron  in the past.   She thinks she is taking both omeprazole  and famotidine . She endorses some breakthrough symptoms depending on what she eats. She thinks she may have breakthrough symptoms atleast once per week. Has ongoing nausea with occasional vomiting.   Feels bowels are moving pretty well right now, states that she was started on senokot in the hospital which seems to work better. Bowels move sporadically can be 2-3 per day or can go a few days without one. She endorses abdominal cramping at times. Thinks she is taking bentyl  which seems to help her pain at times, but other times she may get no relief from this.    Last EGD 10/2022: - No endoscopic esophageal abnormality to explain                            patient's dysphagia. Esophagus dilated. Dilated.                           - 1 cm hiatal hernia.                           - Normal stomach. Biopsied.                           - Mucosal nodule found in the duodenum. Biopsied.  A. SMALL  BOWEL, BIOPSY:  - Benign small bowel mucosa with foveolar metaplasia, suggestive of  peptic injury   B. STOMACH, BIOPSY:  - Mild reactive gastropathy.  - Negative for H. pylori on HE stain  - No intestinal metaplasia, dysplasia, or malignancy.   Last Colonoscopy:07/24/19- Decreased sphincter tone found on digital rectal exam. - Diverticulosis in the entire examined colon. - One 10 mm polyp at the hepatic flexure, removed piecemeal using a cold snare. Complete resection. Partial retrieval. Clips (MR conditional) were placed. Tubular adenoma - One small polyp at the splenic flexure, removed with a cold snare. Resected and retrieved. -benign colonic mucosa - External hemorrhoids. esophagram April 2023: Protrusion of cricopharyngeus muscle noted with swallowing. Implanted device in place to upper right chest.    Recommendations:  Repeat TCS 5 years   Past Medical History:  Diagnosis Date   Anemia    Arthritis    COPD (chronic obstructive  pulmonary disease) (HCC)    COVID-19 11/11/2020   Depression    Diabetes (HCC)    Diverticulitis    Diverticulosis    Dyspnea    Dysrhythmia    Family history of adverse reaction to anesthesia    sister has trouble waking up, requires additional oxygen   Fibromyalgia    GERD (gastroesophageal reflux disease)    Headache    High cholesterol    History of kidney stones    Hypertension    Hypothyroid    IBS (irritable bowel syndrome)    Osteopenia    Pneumonia    Restless leg    Sleep apnea    Doesn't use CPAP.     Tachycardia    per pt/fim   Tremor, essential 01/07/2018    Past Surgical History:  Procedure Laterality Date   ABDOMINAL HYSTERECTOMY     ANTERIOR CERVICAL DECOMPRESSION/DISCECTOMY FUSION 4 LEVELS N/A 01/31/2023   Procedure: ACDF,IP,PLATE/SCREWS R65, C45, C56, C67;  Surgeon: Mavis Purchase, MD;  Location: [facility] OR;  Service: Neurosurgery;  Laterality: N/A;   APPENDECTOMY     BACK SURGERY     BIOPSY  12/25/2016   Procedure: BIOPSY;  Surgeon: Golda Claudis PENNER, MD;  Location: [facility];  Service: Endoscopy;;  gastric    BIOPSY  10/26/2022   Procedure: BIOPSY;  Surgeon: Eartha Angelia Sieving, MD;  Location: [facility];  Service: Gastroenterology;;   BREAST LUMPECTOMY Right 03/20/1999   CARDIAC CATHETERIZATION  05/28/2000   CATARACT EXTRACTION Bilateral 03/19/2014   CATARACT EXTRACTION Bilateral    CHOLECYSTECTOMY     COLONOSCOPY N/A 12/17/2012   Procedure: COLONOSCOPY;  Surgeon: Claudis PENNER Golda, MD;  Location: [facility];  Service: Endoscopy;  Laterality: N/A;  215   COLONOSCOPY WITH PROPOFOL  N/A 07/24/2019   Procedure: COLONOSCOPY WITH PROPOFOL ;  Surgeon: Golda Claudis PENNER, MD;  [facility]: [facility];  Service: Endoscopy;  Laterality: N/A;  730   complete hysterectomy     DRUG INDUCED ENDOSCOPY N/A 09/02/2020   Procedure: DRUG INDUCED ENDOSCOPY;  Surgeon: Mable Lenis, MD;  Location: [facility];  Service: ENT;  Laterality: N/A;    ESOPHAGEAL DILATION N/A 12/25/2016   Procedure: ESOPHAGEAL DILATION;  Surgeon: Golda Claudis PENNER, MD;  [facility];  Service: Endoscopy;  Laterality: N/A;   ESOPHAGEAL DILATION N/A 07/24/2019   Procedure: ESOPHAGEAL DILATION;  Surgeon: Golda Claudis PENNER, MD;  [facility];  Service: Endoscopy;  Laterality: N/A;   ESOPHAGEAL DILATION N/A 08/02/2021   Procedure: ESOPHAGEAL DILATION;  Surgeon: Golda Claudis PENNER, MD;  [facility];  Service: Endoscopy;  Laterality: N/A;   ESOPHAGOGASTRODUODENOSCOPY N/A 12/25/2016   Procedure: ESOPHAGOGASTRODUODENOSCOPY (EGD);  Surgeon: Golda Claudis PENNER, MD;  [facility];  Service: Endoscopy;  Laterality: N/A;  730   ESOPHAGOGASTRODUODENOSCOPY (EGD) WITH PROPOFOL  N/A 07/24/2019   Procedure: ESOPHAGOGASTRODUODENOSCOPY (EGD) WITH PROPOFOL ;  Surgeon: Golda Claudis PENNER, MD;  [facility];  Service: Endoscopy;  Laterality: N/A;   ESOPHAGOGASTRODUODENOSCOPY (EGD) WITH PROPOFOL  N/A 08/02/2021   Procedure: ESOPHAGOGASTRODUODENOSCOPY (EGD) WITH PROPOFOL ;  Surgeon: Golda Claudis PENNER, MD;  [facility];  Service: Endoscopy;  Laterality: N/A;  1245 ASA 2   ESOPHAGOGASTRODUODENOSCOPY (EGD) WITH PROPOFOL  N/A 10/26/2022   Procedure: ESOPHAGOGASTRODUODENOSCOPY (EGD) WITH PROPOFOL ;  Surgeon: Eartha Angelia Sieving, MD;  [facility];  Service: Gastroenterology;  Laterality: N/A;  11:00AM;ASA 3   EXTRACORPOREAL SHOCK WAVE LITHOTRIPSY Left 02/21/2021   Procedure: EXTRACORPOREAL SHOCK WAVE LITHOTRIPSY (ESWL);  Surgeon: Roseann Adine PARAS., MD;  Location: [facility];  Service: Urology;  Laterality: Left;   Heel tumor removed     IMPLANTATION OF HYPOGLOSSAL NERVE STIMULATOR Right 11/25/2020   Procedure: IMPLANTATION OF HYPOGLOSSAL NERVE STIMULATOR;  Surgeon: Mable Lenis, MD;  Location: [facility] OR;  Service: ENT;  Laterality: Right;   POLYPECTOMY  07/24/2019   Procedure: POLYPECTOMY;  Surgeon: Golda Claudis PENNER,  MD;  [facility];  Service: Endoscopy;;   rt elbow surgery     SAVORY DILATION  10/26/2022   Procedure: SAVORY DILATION;  Surgeon: Eartha Angelia Sieving, MD;  [facility];  Service: Gastroenterology;;   THUMB FUSION Bilateral    TONSILLECTOMY     TRIGGER FINGER RELEASE     Right and Left, Dr. Shari, 2023/2024    Current Outpatient Medications  Medication Sig Dispense Refill   albuterol  (VENTOLIN  HFA) 108 (90 Base) MCG/ACT inhaler Inhale 2 puffs into the lungs every 6 (six) hours as needed for wheezing or shortness of breath. 8 g 1   aspirin  EC 81 MG tablet Take 81 mg by mouth every 6 (six) hours as needed for moderate pain (pain score 4-6). Swallow whole.     cyclobenzaprine  (FLEXERIL ) 5 MG tablet Take 1 tablet (5 mg total) by mouth 3 (three) times daily as needed for muscle spasms. 30 tablet 0   diclofenac Sodium (VOLTAREN) 1 % GEL Apply 4 g topically 4 (four) times daily.     dicyclomine  (BENTYL ) 10 MG capsule TAKE 1 CAPSULE BY MOUTH THREE TIMES DAILY BEFORE MEAL(S) 180 capsule 0   Biotin  w/ Vitamins C & E (HAIR SKIN & NAILS GUMMIES PO) Take 2 tablets by mouth in the morning and at bedtime.     Cholecalciferol  (VITAMIN D -3) 25 MCG (1000 UT) CAPS Take 1,000 Units by mouth in the morning. (Patient not taking: Reported on 12/05/2023)     Cyanocobalamin  (B-12) 500 MCG TABS Take 500 mcg by mouth in the morning. (Patient not taking: Reported on 12/05/2023)     DULoxetine  (CYMBALTA ) 60 MG capsule Take 60 mg by mouth in the morning. (Patient not taking: Reported on 10/21/2023)     famotidine  (PEPCID ) 40 MG tablet TAKE 1 TABLET BY MOUTH AT BEDTIME 90 tablet 3   Fluticasone -Salmeterol (ADVAIR) 250-50 MCG/DOSE AEPB Inhale 1 puff into the lungs every 12 (twelve) hours. (Patient not taking: Reported on 10/21/2023)     gabapentin  (NEURONTIN ) 100 MG capsule Take 100 mg by mouth daily. (Patient not taking: Reported on 10/21/2023)     iron  polysaccharides (FERREX 150) 150 MG  capsule  Take 1 capsule by mouth once daily (Patient not taking: Reported on 10/21/2023) 60 capsule 2   levothyroxine  (SYNTHROID , LEVOTHROID) 75 MCG tablet Take 37.5 mcg by mouth daily before breakfast.     lisinopril  (PRINIVIL ,ZESTRIL ) 10 MG tablet Take 10 mg by mouth in the morning.     loperamide (IMODIUM A-D) 2 MG tablet Take 2 mg by mouth 4 (four) times daily as needed for diarrhea or loose stools.     metFORMIN  (GLUCOPHAGE -XR) 500 MG 24 hr tablet Take 500 mg by mouth daily before supper.     neomycin-bacitracin -polymyxin (NEOSPORIN) ointment Apply 1 application topically as needed for wound care.     omeprazole  (PRILOSEC) 40 MG capsule Take 1 capsule by mouth once daily 90 capsule 0   ondansetron  (ZOFRAN ) 4 MG tablet TAKE 1 TABLET BY MOUTH EVERY 8 HOURS AS NEEDED FOR NAUSEA OR VOMITING 30 tablet 5   senna-docusate (SENOKOT-S) 8.6-50 MG tablet Take 1 tablet by mouth at bedtime. 30 tablet 1   simvastatin  (ZOCOR ) 20 MG tablet Take 20 mg by mouth every evening.     traMADol  (ULTRAM ) 50 MG tablet Take 50 mg by mouth every 6 (six) hours as needed for severe pain (pain score 7-10).     traZODone  (DESYREL ) 100 MG tablet Take 50 mg by mouth at bedtime.     No current facility-administered medications for this visit.    Allergies as of 12/05/2023 - Review Complete 12/05/2023  Allergen Reaction Noted   Flagyl [metronidazole] Shortness Of Breath and Swelling 10/07/2015   Lyrica [pregabalin] Shortness Of Breath 01/04/2015   Adhesive [tape] Other (See Comments) 12/02/2012   Aspirin  Other (See Comments) 11/24/2012   Latex Itching and Rash 11/24/2012   Phenergan [promethazine hcl] Other (See Comments) 10/07/2015   Prednisone Anxiety 11/24/2012   Sulfa antibiotics Nausea Only 10/07/2015   Tetanus toxoid-containing vaccines Swelling 11/24/2012   Wound dressing adhesive Rash 05/07/2021    Social History   Socioeconomic History   Marital status: Married    Spouse name: Not on file   Number of children: 2    Years of education: Automotive engineer   Highest education level: Associate degree: academic program  Occupational History   Occupation: Retired  Tobacco Use   Smoking status: Never    Passive exposure: Never   Smokeless tobacco: Never  Vaping Use   Vaping status: Never Used  Substance and Sexual Activity   Alcohol  use: No    Alcohol /week: 0.0 standard drinks of alcohol    Drug use: Never   Sexual activity: Not Currently    Birth control/protection: Abstinence  Other Topics Concern   Not on file  Social History Narrative   Lives at home w/ her husband   Right-handed   Occasional caffeine: decaf tea, diet sodas 2 daily   Social Drivers of Corporate investment banker Strain: Not on file  Food Insecurity: No Food Insecurity (10/20/2023)   Hunger Vital Sign    Worried About Running Out of Food in the Last Year: Never true    Ran Out of Food in the Last Year: Never true  Transportation Needs: No Transportation Needs (10/20/2023)   PRAPARE - Administrator, Civil Service (Medical): No    Lack of Transportation (Non-Medical): No  Physical Activity: Not on file  Stress: Not on file  Social Connections: Unknown (10/20/2023)   Social Connection and Isolation Panel    Frequency of Communication with Friends and Family: More than three times a week  Frequency of Social Gatherings with Friends and Family: More than three times a week    Attends Religious Services: Patient declined    Database administrator or Organizations: Patient declined    Attends Engineer, structural: Patient declined    Marital Status: Married    Review of systems General: negative for night sweats, fever, chills, weight loss +fatigue Neck: Negative for lumps, goiter, pain and significant neck swelling Resp: Negative for cough, wheezing, dyspnea at rest CV: Negative for chest pain, leg swelling, palpitations, orthopnea GI: denies melena, hematochezia, vomiting, diarrhea, dysphagia, odyonophagia,  early satiety or unintentional weight loss. +nausea +abdominal cramping +GERD MSK: Negative for joint pain or swelling, back pain, and muscle pain. Derm: Negative for itching or rash Psych: Denies depression, anxiety, memory loss, confusion. No homicidal or suicidal ideation.  Heme: Negative for prolonged bleeding, bruising easily, and swollen nodes. Endocrine: Negative for cold or heat intolerance, polyuria, polydipsia and goiter. Neuro: negative for tremor, gait imbalance, syncope and seizures. The remainder of the review of systems is noncontributory.  Physical Exam: There were no vitals taken for this visit. General:   Alert and oriented. No distress noted. Pleasant and cooperative.  Head:  Normocephalic and atraumatic. Eyes:  Conjuctiva clear without scleral icterus. Mouth:  Oral mucosa pink and moist. Good dentition. No lesions. Heart: Normal rate and rhythm, s1 and s2 heart sounds present.  Lungs: Clear lung sounds in all lobes. Respirations equal and unlabored. Abdomen:  +BS, soft, non-tender and non-distended. No rebound or guarding. No HSM or masses noted. Derm: No palmar erythema or jaundice Msk:  Symmetrical without gross deformities. Normal posture. Extremities:  Without edema. Neurologic:  Alert and  oriented x4 Psych:  Alert and cooperative. Normal mood and affect.  Invalid input(s): 6 MONTHS   ASSESSMENT: Heather Espinoza is a 78 y.o. female presenting today for follow up of IBS, GERD and IDA  IBS: mix of both diarrhea and constipation, feels symptoms mostly well controlled on sennokot, thinks she is using bentyl  as needed for abdominal cramping with some improvement. Will continue with current regimen.   GERD: on omeprazole  and famotidine  daily with breakthrough about 1 time per week. Recommend increasing omeprazole  to BID and continue famotidine  at bedtime as well as Good reflux precautions   IDA: recent EGD as outlined above, last TCS in 2021 though she has long  history of intermittent IDA since atleast back to 2020 with more recent acute on chronic IDA. She has not tolerated PO iron  therefore not been on recent PO supplementation, she did receive iron  infusions x2 in August, no repeat labs done thus far. She has no rectal bleeding or melena. Family history of CRC in her brother, due for repeat TCS 07/2024, however, if iron  levels remain low despite iron  infusions, may be beneficial to go ahead and proceed with updating colonoscopy. We also discussed capsule endoscopy as she has never had full evaluation of the small bowel which could be a source of her IDA as well. Will await updated CBC and iron  studies and make further recommendations at that time, of note recent FOBT was negative (x1 only), this could also be relative iron  deficiency. I am going to refer her to hematology for further workup on their end and for any further iron  infusions she may need.    PLAN:  -repeat CBC, Iron  studies  -will need to consider updating colonoscopy/possible capsule study if IDA persist despite IV iron  infusions -increase omeprazole  40mg  to BID, continue famotidine   40mg  at bedtime -continue bentyl  PRN -contine sennokot  -referral to hematology   All questions were answered, patient verbalized understanding and is in agreement with plan as outlined above.   Follow Up: 2 months   Cliff Damiani L. Bryann Mcnealy, MSN, APRN, AGNP-C Adult-Gerontology Nurse Practitioner [facility]  I have reviewed the note and agree with the APP's assessment as described in this progress note  Will reach the patient to discuss again possibility of proceeding with colonoscopy.  Toribio Fortune, MD Gastroenterology and Hepatology [facility]

## 2023-12-20 ENCOUNTER — Ambulatory Visit: Admitting: Urology

## 2023-12-27 ENCOUNTER — Encounter: Payer: Self-pay | Admitting: Oncology

## 2023-12-27 ENCOUNTER — Inpatient Hospital Stay: Attending: Oncology | Admitting: Oncology

## 2023-12-27 ENCOUNTER — Inpatient Hospital Stay

## 2023-12-27 ENCOUNTER — Ambulatory Visit: Payer: Self-pay | Admitting: Oncology

## 2023-12-27 VITALS — BP 146/76 | HR 78 | Temp 97.9°F | Resp 20 | Ht 61.0 in | Wt 120.4 lb

## 2023-12-27 DIAGNOSIS — Z800 Family history of malignant neoplasm of digestive organs: Secondary | ICD-10-CM | POA: Insufficient documentation

## 2023-12-27 DIAGNOSIS — D509 Iron deficiency anemia, unspecified: Secondary | ICD-10-CM | POA: Insufficient documentation

## 2023-12-27 DIAGNOSIS — D500 Iron deficiency anemia secondary to blood loss (chronic): Secondary | ICD-10-CM

## 2023-12-27 DIAGNOSIS — Z8049 Family history of malignant neoplasm of other genital organs: Secondary | ICD-10-CM | POA: Diagnosis not present

## 2023-12-27 DIAGNOSIS — Z801 Family history of malignant neoplasm of trachea, bronchus and lung: Secondary | ICD-10-CM | POA: Insufficient documentation

## 2023-12-27 LAB — CBC WITH DIFFERENTIAL/PLATELET
Abs Immature Granulocytes: 0.01 K/uL (ref 0.00–0.07)
Basophils Absolute: 0.1 K/uL (ref 0.0–0.1)
Basophils Relative: 1 %
Eosinophils Absolute: 0.2 K/uL (ref 0.0–0.5)
Eosinophils Relative: 4 %
HCT: 34.1 % — ABNORMAL LOW (ref 36.0–46.0)
Hemoglobin: 11.3 g/dL — ABNORMAL LOW (ref 12.0–15.0)
Immature Granulocytes: 0 %
Lymphocytes Relative: 36 %
Lymphs Abs: 2.4 K/uL (ref 0.7–4.0)
MCH: 32.7 pg (ref 26.0–34.0)
MCHC: 33.1 g/dL (ref 30.0–36.0)
MCV: 98.6 fL (ref 80.0–100.0)
Monocytes Absolute: 0.7 K/uL (ref 0.1–1.0)
Monocytes Relative: 10 %
Neutro Abs: 3.3 K/uL (ref 1.7–7.7)
Neutrophils Relative %: 49 %
Platelets: 344 K/uL (ref 150–400)
RBC: 3.46 MIL/uL — ABNORMAL LOW (ref 3.87–5.11)
RDW: 12.3 % (ref 11.5–15.5)
WBC: 6.7 K/uL (ref 4.0–10.5)
nRBC: 0 % (ref 0.0–0.2)

## 2023-12-27 LAB — IRON AND TIBC
Iron: 138 ug/dL (ref 28–170)
Saturation Ratios: 45 % — ABNORMAL HIGH (ref 10.4–31.8)
TIBC: 307 ug/dL (ref 250–450)
UIBC: 169 ug/dL

## 2023-12-27 LAB — COMPREHENSIVE METABOLIC PANEL WITH GFR
ALT: 17 U/L (ref 0–44)
AST: 23 U/L (ref 15–41)
Albumin: 4.7 g/dL (ref 3.5–5.0)
Alkaline Phosphatase: 47 U/L (ref 38–126)
Anion gap: 16 — ABNORMAL HIGH (ref 5–15)
BUN: 20 mg/dL (ref 8–23)
CO2: 22 mmol/L (ref 22–32)
Calcium: 10 mg/dL (ref 8.9–10.3)
Chloride: 104 mmol/L (ref 98–111)
Creatinine, Ser: 1.41 mg/dL — ABNORMAL HIGH (ref 0.44–1.00)
GFR, Estimated: 38 mL/min — ABNORMAL LOW (ref >60)
Glucose, Bld: 115 mg/dL — ABNORMAL HIGH (ref 70–99)
Potassium: 4.6 mmol/L (ref 3.5–5.1)
Sodium: 142 mmol/L (ref 135–145)
Total Bilirubin: 0.3 mg/dL (ref 0.0–1.2)
Total Protein: 7.6 g/dL (ref 6.5–8.1)

## 2023-12-27 LAB — RETICULOCYTES
Immature Retic Fract: 8.2 % (ref 2.3–15.9)
RBC.: 3.49 MIL/uL — ABNORMAL LOW (ref 3.87–5.11)
Retic Count, Absolute: 33.9 K/uL (ref 19.0–186.0)
Retic Ct Pct: 1 % (ref 0.4–3.1)

## 2023-12-27 LAB — FERRITIN: Ferritin: 858 ng/mL — ABNORMAL HIGH (ref 11–307)

## 2023-12-27 LAB — FOLATE: Folate: 8.4 ng/mL (ref >5.9)

## 2023-12-27 LAB — LACTATE DEHYDROGENASE: LDH: 163 U/L (ref 98–192)

## 2023-12-27 LAB — VITAMIN B12: Vitamin B-12: 898 pg/mL (ref 180–914)

## 2023-12-27 NOTE — Progress Notes (Signed)
 [facility]  Telephone:([phone]) [phone] Fax:([phone]    INITIAL HEMATOLOGY CONSULTATION  Referring Provider: Mitzie Boettcher, NP   Reason for Referral: Iron  deficiency anemia  HPI: Ms. Heather Espinoza is a 78 year old female with a past medical history significant for COPD, alpha 1 antitrypsin, depression, diabetes, diverticulitis, fibromyalgia, GERD, hyperlipidemia, hypertension, hypothyroidism, IBS, osteopenia, RLS, sleep apnea, essential tremor.  She has been referred to hematology for evaluation of iron  deficiency anemia.  The patient was hospitalized from 10/20/2023 through 10/22/2023 for sepsis due to gram-negative UTI.  During her hospitalization, she was noted to have worsening anemia and she received 1 dose of Venofer  during that hospitalization.  She also received Feraheme 510 mg IV on 10/31/2023 and another dose on 11/08/2023.    Her most recent lab work available to me is dated 10/22/2023 and at that time her hemoglobin was 8.0 and stool for occult blood on that date was negative.  The most recent iron  studies were obtained on 8//2025 which showed a ferritin level of 58, iron  6, TIBC 286, percent saturation 2%, folate 12.0, vitamin B12 449.  Prior lab work has been reviewed and the patient has had persistent anemia dating back to at least 2018.  She underwent upper endoscopy on 10/26/2022 which showed no endoscopic esophageal abnormality, the esophagus was dilated, 1 cm hiatal hernia, normal stomach, mucosal nodule found in the duodenum.  Last colonoscopy was completed on 07/24/2019 which showed decreased sphincter tone found on digital rectal exam, diverticulosis in the entire examined colon, one 10 millimeter polyp at the hepatic flexure, one small polyp at the splenic flexure, external hemorrhoids.  The patient reports that she received IV iron  and tolerated it well.  Despite receiving the IV iron , she continues to report fatigue.  She is also noticing that she has headaches but no  dizziness.  She does report abdominal discomfort and has alternating loose stools with constipation.  She has not seen any bleeding such as epistaxis, hematuria, melena, hematochezia.  She denies chest discomfort but does report dyspnea on exertion.  She does have peripheral neuropathy in her hands and feet.  She tells me that she is prone to getting urinary tract infections and does not typically have symptoms.  She has never required a blood transfusion.  Has taken oral iron  intermittently but this causes abdominal discomfort and constipation.  Past Medical History:  Diagnosis Date   Anemia    Arthritis    COPD (chronic obstructive pulmonary disease) (HCC)    COVID-19 11/11/2020   Depression    Diabetes (HCC)    Diverticulitis    Diverticulosis    Dyspnea    Dysrhythmia    Family history of adverse reaction to anesthesia    sister has trouble waking up, requires additional oxygen   Fibromyalgia    GERD (gastroesophageal reflux disease)    Headache    High cholesterol    History of kidney stones    Hypertension    Hypothyroid    IBS (irritable bowel syndrome)    Osteopenia    Pneumonia    Restless leg    Sleep apnea    Doesn't use CPAP.     Tachycardia    per pt/fim   Tremor, essential 01/07/2018  :  Past Surgical History:  Procedure Laterality Date   ABDOMINAL HYSTERECTOMY     ANTERIOR CERVICAL DECOMPRESSION/DISCECTOMY FUSION 4 LEVELS N/A 01/31/2023   Procedure: ACDF,IP,PLATE/SCREWS R65, C45, C56, C67;  Surgeon: Mavis Purchase, MD;  Location: [facility];  Service: Neurosurgery;  Laterality: N/A;   APPENDECTOMY     BACK SURGERY     BIOPSY  12/25/2016   Procedure: BIOPSY;  Surgeon: Golda Claudis PENNER, MD;  Location: [facility];  Service: Endoscopy;;  gastric    BIOPSY  10/26/2022   Procedure: BIOPSY;  Surgeon: Eartha Angelia Sieving, MD;  Location: [facility];  Service: Gastroenterology;;   BREAST LUMPECTOMY Right 03/20/1999   CARDIAC CATHETERIZATION  05/28/2000    CATARACT EXTRACTION Bilateral 03/19/2014   CATARACT EXTRACTION Bilateral    CHOLECYSTECTOMY     COLONOSCOPY N/A 12/17/2012   Procedure: COLONOSCOPY;  Surgeon: Claudis PENNER Golda, MD;  Location: [facility];  Service: Endoscopy;  Laterality: N/A;  215   COLONOSCOPY WITH PROPOFOL  N/A 07/24/2019   Procedure: COLONOSCOPY WITH PROPOFOL ;  Surgeon: Golda Claudis PENNER, MD;  Location: [facility];  Service: Endoscopy;  Laterality: N/A;  730   complete hysterectomy     DRUG INDUCED ENDOSCOPY N/A 09/02/2020   Procedure: DRUG INDUCED ENDOSCOPY;  Surgeon: Mable Lenis, MD;  Location: [facility];  Service: ENT;  Laterality: N/A;   ESOPHAGEAL DILATION N/A 12/25/2016   Procedure: ESOPHAGEAL DILATION;  Surgeon: Golda Claudis PENNER, MD;  [facility]: [facility];  Service: Endoscopy;  Laterality: N/A;   ESOPHAGEAL DILATION N/A 07/24/2019   Procedure: ESOPHAGEAL DILATION;  Surgeon: Golda Claudis PENNER, MD;  [facility]: [facility];  Service: Endoscopy;  Laterality: N/A;   ESOPHAGEAL DILATION N/A 08/02/2021   Procedure: ESOPHAGEAL DILATION;  Surgeon: Golda Claudis PENNER, MD;  [facility]: [facility];  Service: Endoscopy;  Laterality: N/A;   ESOPHAGOGASTRODUODENOSCOPY N/A 12/25/2016   Procedure: ESOPHAGOGASTRODUODENOSCOPY (EGD);  Surgeon: Golda Claudis PENNER, MD;  [facility]: [facility];  Service: Endoscopy;  Laterality: N/A;  730   ESOPHAGOGASTRODUODENOSCOPY (EGD) WITH PROPOFOL  N/A 07/24/2019   Procedure: ESOPHAGOGASTRODUODENOSCOPY (EGD) WITH PROPOFOL ;  Surgeon: Golda Claudis PENNER, MD;  [facility]: [facility];  Service: Endoscopy;  Laterality: N/A;   ESOPHAGOGASTRODUODENOSCOPY (EGD) WITH PROPOFOL  N/A 08/02/2021   Procedure: ESOPHAGOGASTRODUODENOSCOPY (EGD) WITH PROPOFOL ;  Surgeon: Golda Claudis PENNER, MD;  [facility];  Service: Endoscopy;  Laterality: N/A;  1245 ASA 2   ESOPHAGOGASTRODUODENOSCOPY (EGD) WITH PROPOFOL  N/A 10/26/2022   Procedure: ESOPHAGOGASTRODUODENOSCOPY (EGD) WITH PROPOFOL ;   Surgeon: Eartha Angelia Sieving, MD;  [facility]: [facility];  Service: Gastroenterology;  Laterality: N/A;  11:00AM;ASA 3   EXTRACORPOREAL SHOCK WAVE LITHOTRIPSY Left 02/21/2021   Procedure: EXTRACORPOREAL SHOCK WAVE LITHOTRIPSY (ESWL);  Surgeon: Roseann Adine PARAS., MD;  Location: [facility];  Service: Urology;  Laterality: Left;   Heel tumor removed     IMPLANTATION OF HYPOGLOSSAL NERVE STIMULATOR Right 11/25/2020   Procedure: IMPLANTATION OF HYPOGLOSSAL NERVE STIMULATOR;  Surgeon: Mable Lenis, MD;  Location: [facility];  Service: ENT;  Laterality: Right;   POLYPECTOMY  07/24/2019   Procedure: POLYPECTOMY;  Surgeon: Golda Claudis PENNER, MD;  [facility];  Service: Endoscopy;;   rt elbow surgery     SAVORY DILATION  10/26/2022   Procedure: SAVORY DILATION;  Surgeon: Eartha Angelia Sieving, MD;  [facility]: [facility];  Service: Gastroenterology;;   THUMB FUSION Bilateral    TONSILLECTOMY     TRIGGER FINGER RELEASE     Right and Left, Dr. Shari, 2023/2024  :  CURRENT MEDS: Current Outpatient Medications  Medication Sig Dispense Refill   albuterol  (VENTOLIN  HFA) 108 (90 Base) MCG/ACT inhaler Inhale 2 puffs into the lungs every 6 (six) hours as needed for wheezing or shortness of  breath. 8 g 1   aspirin  EC 81 MG tablet Take 81 mg by mouth every 6 (six) hours as needed for moderate pain (pain score 4-6). Swallow whole.     Biotin  w/ Vitamins C & E (HAIR SKIN & NAILS GUMMIES PO) Take 2 tablets by mouth in the morning and at bedtime.     cyclobenzaprine  (FLEXERIL ) 5 MG tablet Take 1 tablet (5 mg total) by mouth 3 (three) times daily as needed for muscle spasms. 30 tablet 0   diclofenac Sodium (VOLTAREN) 1 % GEL Apply 4 g topically 4 (four) times daily.     dicyclomine  (BENTYL ) 10 MG capsule TAKE 1 CAPSULE BY MOUTH THREE TIMES DAILY BEFORE MEAL(S) 180 capsule 0   famotidine  (PEPCID ) 40 MG tablet TAKE 1 TABLET BY MOUTH AT BEDTIME 90 tablet 3   Fluticasone -Salmeterol (ADVAIR)  250-50 MCG/DOSE AEPB Inhale 1 puff into the lungs every 12 (twelve) hours.     levothyroxine  (SYNTHROID , LEVOTHROID) 75 MCG tablet Take 37.5 mcg by mouth daily before breakfast.     lisinopril  (PRINIVIL ,ZESTRIL ) 10 MG tablet Take 10 mg by mouth in the morning.     loperamide (IMODIUM A-D) 2 MG tablet Take 2 mg by mouth 4 (four) times daily as needed for diarrhea or loose stools.     metFORMIN  (GLUCOPHAGE -XR) 500 MG 24 hr tablet Take 500 mg by mouth daily before supper.     neomycin-bacitracin -polymyxin (NEOSPORIN) ointment Apply 1 application topically as needed for wound care.     omeprazole  (PRILOSEC) 40 MG capsule Take 1 capsule by mouth once daily 90 capsule 0   ondansetron  (ZOFRAN ) 4 MG tablet TAKE 1 TABLET BY MOUTH EVERY 8 HOURS AS NEEDED FOR NAUSEA OR VOMITING 30 tablet 5   senna-docusate (SENOKOT-S) 8.6-50 MG tablet Take 1 tablet by mouth at bedtime. 30 tablet 1   simvastatin  (ZOCOR ) 20 MG tablet Take 20 mg by mouth every evening.     traMADol  (ULTRAM ) 50 MG tablet Take 50 mg by mouth every 6 (six) hours as needed for severe pain (pain score 7-10).     traZODone  (DESYREL ) 100 MG tablet Take 50 mg by mouth at bedtime.     Cholecalciferol  (VITAMIN D -3) 25 MCG (1000 UT) CAPS Take 1,000 Units by mouth in the morning. (Patient not taking: Reported on 12/27/2023)     Cyanocobalamin  (B-12) 500 MCG TABS Take 500 mcg by mouth in the morning. (Patient not taking: Reported on 12/27/2023)     DULoxetine  (CYMBALTA ) 60 MG capsule Take 60 mg by mouth in the morning. (Patient not taking: Reported on 12/27/2023)     gabapentin  (NEURONTIN ) 100 MG capsule Take 100 mg by mouth daily. (Patient not taking: Reported on 12/27/2023)     iron  polysaccharides (FERREX 150) 150 MG capsule Take 1 capsule by mouth once daily (Patient not taking: Reported on 12/27/2023) 60 capsule 2   No current facility-administered medications for this visit.     Allergies  Allergen Reactions   Flagyl [Metronidazole] Shortness Of  Breath and Swelling   Lyrica [Pregabalin] Shortness Of Breath   Adhesive [Tape] Other (See Comments)    Takes off patient's skin   Aspirin  Other (See Comments)    Stomach bleed   Latex Itching and Rash   Phenergan [Promethazine Hcl] Other (See Comments)    Caused grogginess, altered mental status   Prednisone Anxiety   Sulfa Antibiotics Nausea Only   Tetanus Toxoid-Containing Vaccines Swelling    Arm Area   Wound Dressing Adhesive Rash  :  Family History  Problem Relation Age of Onset   Uterine cancer Mother    Parkinson's disease Father    Stroke Father    Colon cancer Brother    Lung cancer Brother    COPD Sister    Diabetes Grandchild    Asthma Son   :   Social History   Socioeconomic History   Marital status: Married    Spouse name: Not on file   Number of children: 2   Years of education: College   Highest education level: Associate degree: academic program  Occupational History   Occupation: Retired  Tobacco Use   Smoking status: Never    Passive exposure: Never   Smokeless tobacco: Never  Vaping Use   Vaping status: Never Used  Substance and Sexual Activity   Alcohol  use: No    Alcohol /week: 0.0 standard drinks of alcohol    Drug use: Never   Sexual activity: Not Currently    Birth control/protection: Abstinence  Other Topics Concern   Not on file  Social History Narrative   Lives at home w/ her husband   Right-handed   Occasional caffeine: decaf tea, diet sodas 2 daily   Social Drivers of Corporate investment banker Strain: Not on file  Food Insecurity: No Food Insecurity (10/20/2023)   Hunger Vital Sign    Worried About Running Out of Food in the Last Year: Never true    Ran Out of Food in the Last Year: Never true  Transportation Needs: No Transportation Needs (10/20/2023)   PRAPARE - Administrator, Civil Service (Medical): No    Lack of Transportation (Non-Medical): No  Physical Activity: Not on file  Stress: Not on file   Social Connections: Unknown (10/20/2023)   Social Connection and Isolation Panel    Frequency of Communication with Friends and Family: More than three times a week    Frequency of Social Gatherings with Friends and Family: More than three times a week    Attends Religious Services: Patient declined    Database administrator or Organizations: Patient declined    Attends Banker Meetings: Patient declined    Marital Status: Married  Catering manager Violence: Not At Risk (10/20/2023)   Humiliation, Afraid, Rape, and Kick questionnaire    Fear of Current or Ex-Partner: No    Emotionally Abused: No    Physically Abused: No    Sexually Abused: No  :  REVIEW OF SYSTEMS:  A comprehensive 14 point review of systems was negative except as noted in the HPI.    Exam: BP (!) 146/76 (BP Location: Left Arm)   Pulse 78   Temp 97.9 F (36.6 C) (Oral)   Resp 20   Ht 5' 1 (1.549 m)   Wt 120 lb 5.9 oz (54.6 kg)   SpO2 98%   BMI 22.74 kg/m    Physical Exam Vitals reviewed.  Constitutional:      General: She is not in acute distress. HENT:     Head: Normocephalic.  Eyes:     General: No scleral icterus.    Conjunctiva/sclera: Conjunctivae normal.  Cardiovascular:     Rate and Rhythm: Normal rate and regular rhythm.  Pulmonary:     Effort: Pulmonary effort is normal. No respiratory distress.     Breath sounds: Normal breath sounds.  Abdominal:     General: Bowel sounds are normal. There is no distension.     Palpations: Abdomen is soft.  Musculoskeletal:  General: Normal range of motion.     Right lower leg: No edema.     Left lower leg: No edema.  Lymphadenopathy:     Cervical: No cervical adenopathy.  Skin:    General: Skin is warm and dry.  Neurological:     Mental Status: She is alert and oriented to person, place, and time.  Psychiatric:        Mood and Affect: Mood normal.        Behavior: Behavior normal.        Thought Content: Thought content normal.         Judgment: Judgment normal.     LABS:  Lab Results  Component Value Date   WBC 9.8 10/22/2023   HGB 8.0 (L) 10/22/2023   HCT 24.4 (L) 10/22/2023   PLT 180 10/22/2023   GLUCOSE 105 (H) 10/22/2023   CHOL 129 05/09/2018   TRIG 134 05/09/2018   HDL 53 05/09/2018   LDLCALC 49 05/09/2018   ALT 18 10/20/2023   AST 25 10/20/2023   NA 136 10/22/2023   K 3.7 10/22/2023   CL 106 10/22/2023   CREATININE 1.06 (H) 10/22/2023   BUN 11 10/22/2023   CO2 23 10/22/2023   INR 1.2 10/21/2023   HGBA1C 6.1 (H) 10/22/2023    No results found.   ASSESSMENT AND PLAN:  1.  Iron  deficiency anemia. The patient has a longstanding history of anemia dating back to at least 2018.  She tells me that she has never required a blood transfusion.  She has taken oral iron  intermittently but does have some GI upset secondary to taking this.  More recently, she received a dose of Venofer  during a hospitalization in August and then received Feraheme 510 mg IV x 2 doses on 10/30/2020 and 11/07/2020.  She tolerated both the Venofer  and the Feraheme very well.  Despite receiving iron , she remains symptomatic with fatigue, headaches, dyspnea.  Today we discussed potential causes of anemia which can include iron  deficiency, other nutritional deficiencies, hemolysis, anemia due to underlying CKD/chronic disease.  Additional workup to be performed today including repeating a CBC, iron  studies, vitamin B12, folate, methylmalonic acid level, copper level, reticulocytes, haptoglobin, LDH, CMP.  I have explained to the patient that we can give her additional IV iron  if she is found to still be iron  deficient today.  Additional recommendations will be pending the workup.  Follow-up: Lab/office visit in 6 to 8 weeks.  We can administer IV iron  if needed prior to this visit.  Thank you for this referral.  Heather Schroepfer, Heather Espinoza, Heather Espinoza, Heather Espinoza

## 2023-12-27 NOTE — Telephone Encounter (Signed)
 Patients husband will let patient know lab results are in range and at this time no IV iron  is needed. He acknowledged that he would let patient know.

## 2023-12-28 LAB — HAPTOGLOBIN: Haptoglobin: 227 mg/dL (ref 42–346)

## 2023-12-31 LAB — COPPER, SERUM: Copper: 92 ug/dL (ref 80–158)

## 2024-01-08 LAB — METHYLMALONIC ACID, SERUM: Methylmalonic Acid, Quantitative: 180 nmol/L (ref 0–378)

## 2024-01-10 ENCOUNTER — Other Ambulatory Visit (INDEPENDENT_AMBULATORY_CARE_PROVIDER_SITE_OTHER): Payer: Self-pay | Admitting: Gastroenterology

## 2024-01-10 DIAGNOSIS — K582 Mixed irritable bowel syndrome: Secondary | ICD-10-CM

## 2024-01-23 ENCOUNTER — Encounter (INDEPENDENT_AMBULATORY_CARE_PROVIDER_SITE_OTHER): Payer: Self-pay | Admitting: Gastroenterology

## 2024-01-26 ENCOUNTER — Other Ambulatory Visit (INDEPENDENT_AMBULATORY_CARE_PROVIDER_SITE_OTHER): Payer: Self-pay | Admitting: Gastroenterology

## 2024-01-27 ENCOUNTER — Ambulatory Visit (INDEPENDENT_AMBULATORY_CARE_PROVIDER_SITE_OTHER): Admitting: Urology

## 2024-01-27 ENCOUNTER — Encounter: Payer: Self-pay | Admitting: Urology

## 2024-01-27 ENCOUNTER — Ambulatory Visit (HOSPITAL_COMMUNITY)
Admission: RE | Admit: 2024-01-27 | Discharge: 2024-01-27 | Disposition: A | Discharge status: Home / Self Care | Source: Ambulatory Visit | Attending: Urology | Admitting: Urology

## 2024-01-27 ENCOUNTER — Other Ambulatory Visit (INDEPENDENT_AMBULATORY_CARE_PROVIDER_SITE_OTHER): Payer: Self-pay | Admitting: Gastroenterology

## 2024-01-27 VITALS — BP 119/69 | HR 72

## 2024-01-27 DIAGNOSIS — R32 Unspecified urinary incontinence: Secondary | ICD-10-CM

## 2024-01-27 DIAGNOSIS — Z87440 Personal history of urinary (tract) infections: Secondary | ICD-10-CM

## 2024-01-27 DIAGNOSIS — N200 Calculus of kidney: Secondary | ICD-10-CM

## 2024-01-27 DIAGNOSIS — N3281 Overactive bladder: Secondary | ICD-10-CM

## 2024-01-27 DIAGNOSIS — N390 Urinary tract infection, site not specified: Secondary | ICD-10-CM

## 2024-01-27 LAB — MICROSCOPIC EXAMINATION: Bacteria, UA: NONE SEEN

## 2024-01-27 LAB — URINALYSIS, ROUTINE W REFLEX MICROSCOPIC
Bilirubin, UA: NEGATIVE
Glucose, UA: NEGATIVE
Ketones, UA: NEGATIVE
Nitrite, UA: NEGATIVE
Protein,UA: NEGATIVE
RBC, UA: NEGATIVE
Specific Gravity, UA: 1.015 (ref 1.005–1.030)
Urobilinogen, Ur: 0.2 mg/dL (ref 0.2–1.0)
pH, UA: 5.5 (ref 5.0–7.5)

## 2024-01-27 LAB — BLADDER SCAN AMB NON-IMAGING: Scan Result: 0

## 2024-01-27 MED ORDER — OMEPRAZOLE 40 MG PO CPDR: 40.0000 mg | DELAYED_RELEASE_CAPSULE | Freq: Two times a day (BID) | ORAL | 3 refills | Status: AC

## 2024-01-27 MED ORDER — NITROFURANTOIN MACROCRYSTAL 50 MG PO CAPS: 50.0000 mg | ORAL_CAPSULE | Freq: Every day | ORAL | 11 refills | Status: AC

## 2024-01-27 MED ORDER — MIRABEGRON ER 25 MG PO TB24: 25.0000 mg | ORAL_TABLET | Freq: Every day | ORAL | 11 refills | Status: DC

## 2024-01-27 NOTE — Progress Notes (Signed)
   Patient can void prior to the bladder scan. Bladder scan result: 0  Performed By: [facility] LPN

## 2024-01-27 NOTE — Progress Notes (Signed)
 01/27/2024 1:32 PM   Heather Espinoza [birth year] [id number]  Referring provider: Kip Righter, MD [address] [place],  [place] [address]  Followup frequent UTI   HPI: Heather Espinoza is a 78yo here for followup for nephrolithiasis, OAB, and frequent UTI. She was last seen by Dr. Roseann and was given sample for gemtesa . Gemtesa  worked well for her OAb but she could not afford the medication. No stone events in the past 3 years. No recent imaging. She gets 3-4 UTIs per year and usually requires hospitalization for sepsis. She was previously on keflex  prophylaxis.    PMH: Past Medical History:  Diagnosis Date   Alpha-1-antitrypsin deficiency (HCC)    Anemia    Arthritis    COPD (chronic obstructive pulmonary disease) (HCC)    COVID-19 11/11/2020   Depression    Diabetes (HCC)    Diverticulitis    Diverticulosis    Dyspnea    Dysrhythmia    Family history of adverse reaction to anesthesia    sister has trouble waking up, requires additional oxygen   Fibromyalgia    GERD (gastroesophageal reflux disease)    Headache    High cholesterol    History of kidney stones    Hypertension    Hypothyroid    IBS (irritable bowel syndrome)    Osteopenia    Pneumonia    Restless leg    Sleep apnea    Doesn't use CPAP.     Tachycardia    per pt/fim   Tremor, essential 01/07/2018    Surgical History: Past Surgical History:  Procedure Laterality Date   ABDOMINAL HYSTERECTOMY     ANTERIOR CERVICAL DECOMPRESSION/DISCECTOMY FUSION 4 LEVELS N/A 01/31/2023   Procedure: ACDF,IP,PLATE/SCREWS R65, C45, C56, C67;  Surgeon: Mavis Purchase, MD;  Location: [facility];  Service: Neurosurgery;  Laterality: N/A;   APPENDECTOMY     BACK SURGERY     BIOPSY  12/25/2016   Procedure: BIOPSY;  Surgeon: Golda Claudis PENNER, MD;  [facility];  Service: Endoscopy;;  gastric    BIOPSY  10/26/2022   Procedure: BIOPSY;  Surgeon: Eartha Angelia Sieving, MD;  [facility];  Service: Gastroenterology;;   BREAST LUMPECTOMY Right 03/20/1999   CARDIAC CATHETERIZATION  05/28/2000   CATARACT EXTRACTION Bilateral 03/19/2014   CATARACT EXTRACTION Bilateral    CHOLECYSTECTOMY     COLONOSCOPY N/A 12/17/2012   Procedure: COLONOSCOPY;  Surgeon: Claudis PENNER Golda, MD;  [facility]: [facility];  Service: Endoscopy;  Laterality: N/A;  215   COLONOSCOPY WITH PROPOFOL  N/A 07/24/2019   Procedure: COLONOSCOPY WITH PROPOFOL ;  Surgeon: Golda Claudis PENNER, MD;  [facility];  Service: Endoscopy;  Laterality: N/A;  730   complete hysterectomy     DRUG INDUCED ENDOSCOPY N/A 09/02/2020   Procedure: DRUG INDUCED ENDOSCOPY;  Surgeon: Mable Lenis, MD;  Location: [facility];  Service: ENT;  Laterality: N/A;   ESOPHAGEAL DILATION N/A 12/25/2016   Procedure: ESOPHAGEAL DILATION;  Surgeon: Golda Claudis PENNER, MD;  [facility];  Service: Endoscopy;  Laterality: N/A;   ESOPHAGEAL DILATION N/A 07/24/2019   Procedure: ESOPHAGEAL DILATION;  Surgeon: Golda Claudis PENNER, MD;  [facility];  Service: Endoscopy;  Laterality: N/A;   ESOPHAGEAL DILATION N/A 08/02/2021   Procedure: ESOPHAGEAL DILATION;  Surgeon: Golda Claudis PENNER, MD;  [facility];  Service: Endoscopy;  Laterality: N/A;   ESOPHAGOGASTRODUODENOSCOPY N/A 12/25/2016   Procedure: ESOPHAGOGASTRODUODENOSCOPY (EGD);  Surgeon: Golda Claudis  U, MD;  [facility];  Service: Endoscopy;  Laterality: N/A;  730   ESOPHAGOGASTRODUODENOSCOPY (EGD) WITH PROPOFOL  N/A 07/24/2019   Procedure: ESOPHAGOGASTRODUODENOSCOPY (EGD) WITH PROPOFOL ;  Surgeon: Golda Claudis PENNER, MD;  [facility];  Service: Endoscopy;  Laterality: N/A;   ESOPHAGOGASTRODUODENOSCOPY (EGD) WITH PROPOFOL  N/A 08/02/2021   Procedure: ESOPHAGOGASTRODUODENOSCOPY (EGD) WITH PROPOFOL ;  Surgeon: Golda Claudis PENNER, MD;  [facility];  Service: Endoscopy;  Laterality: N/A;  1245 ASA 2    ESOPHAGOGASTRODUODENOSCOPY (EGD) WITH PROPOFOL  N/A 10/26/2022   Procedure: ESOPHAGOGASTRODUODENOSCOPY (EGD) WITH PROPOFOL ;  Surgeon: Eartha Angelia Sieving, MD;  [facility];  Service: Gastroenterology;  Laterality: N/A;  11:00AM;ASA 3   EXTRACORPOREAL SHOCK WAVE LITHOTRIPSY Left 02/21/2021   Procedure: EXTRACORPOREAL SHOCK WAVE LITHOTRIPSY (ESWL);  Surgeon: Roseann Adine PARAS., MD;  Location: [facility];  Service: Urology;  Laterality: Left;   Heel tumor removed     IMPLANTATION OF HYPOGLOSSAL NERVE STIMULATOR Right 11/25/2020   Procedure: IMPLANTATION OF HYPOGLOSSAL NERVE STIMULATOR;  Surgeon: Mable Lenis, MD;  Location: [facility] OR;  Service: ENT;  Laterality: Right;   POLYPECTOMY  07/24/2019   Procedure: POLYPECTOMY;  Surgeon: Golda Claudis PENNER, MD;  [facility];  Service: Endoscopy;;   rt elbow surgery     SAVORY DILATION  10/26/2022   Procedure: SAVORY DILATION;  Surgeon: Eartha Angelia Sieving, MD;  [facility];  Service: Gastroenterology;;   THUMB FUSION Bilateral    TONSILLECTOMY     TRIGGER FINGER RELEASE     Right and Left, Dr. Shari, 2023/2024    Home Medications:  Allergies as of 01/27/2024       Reactions   Flagyl [metronidazole] Shortness Of Breath, Swelling   Lyrica [pregabalin] Shortness Of Breath   Adhesive [tape] Other (See Comments)   Takes off patient's skin   Aspirin  Other (See Comments)   Stomach bleed   Latex Itching, Rash   Phenergan [promethazine Hcl] Other (See Comments)   Caused grogginess, altered mental status   Prednisone Anxiety   Sulfa Antibiotics Nausea Only   Tetanus Toxoid-containing Vaccines Swelling   Arm Area   Wound Dressing Adhesive Rash        Medication List        Accurate as of January 27, 2024  1:32 PM. If you have any questions, ask your nurse or doctor.          albuterol  108 (90 Base) MCG/ACT inhaler Commonly known as: VENTOLIN  HFA Inhale 2 puffs into the lungs every 6 (six)  hours as needed for wheezing or shortness of breath.   aspirin  EC 81 MG tablet Take 81 mg by mouth every 6 (six) hours as needed for moderate pain (pain score 4-6). Swallow whole.   B-12 500 MCG Tabs Take 500 mcg by mouth in the morning.   cyclobenzaprine  5 MG tablet Commonly known as: FLEXERIL  Take 1 tablet (5 mg total) by mouth 3 (three) times daily as needed for muscle spasms.   diclofenac Sodium 1 % Gel Commonly known as: VOLTAREN Apply 4 g topically 4 (four) times daily.   dicyclomine  10 MG capsule Commonly known as: BENTYL  TAKE 1 CAPSULE BY MOUTH THREE TIMES DAILY BEFORE MEAL(S)   DULoxetine  60 MG capsule Commonly known as: CYMBALTA  Take 60 mg by mouth in the morning.   famotidine  40 MG tablet Commonly known as: PEPCID  TAKE 1 TABLET BY MOUTH AT BEDTIME   Ferrex 150 150 MG capsule Generic drug: iron  polysaccharides Take 1  capsule by mouth once daily   Fluticasone -Salmeterol 250-50 MCG/DOSE Aepb Commonly known as: ADVAIR Inhale 1 puff into the lungs every 12 (twelve) hours.   gabapentin  100 MG capsule Commonly known as: NEURONTIN  Take 100 mg by mouth daily.   HAIR SKIN & NAILS GUMMIES PO Take 2 tablets by mouth in the morning and at bedtime.   levothyroxine  75 MCG tablet Commonly known as: SYNTHROID  Take 37.5 mcg by mouth daily before breakfast.   lisinopril  10 MG tablet Commonly known as: ZESTRIL  Take 10 mg by mouth in the morning.   loperamide 2 MG tablet Commonly known as: IMODIUM A-D Take 2 mg by mouth 4 (four) times daily as needed for diarrhea or loose stools.   metFORMIN  500 MG 24 hr tablet Commonly known as: GLUCOPHAGE -XR Take 500 mg by mouth daily before supper.   neomycin-bacitracin -polymyxin [id number] ointment Commonly known as: NEOSPORIN Apply 1 application topically as needed for wound care.   omeprazole  40 MG capsule Commonly known as: PRILOSEC Take 1 capsule (40 mg total) by mouth in the morning and at bedtime. What changed: when  to take this Changed by: Chelsea L Carlan   ondansetron  4 MG tablet Commonly known as: ZOFRAN  TAKE 1 TABLET BY MOUTH EVERY 8 HOURS AS NEEDED FOR NAUSEA OR VOMITING   senna-docusate 8.6-50 MG tablet Commonly known as: Senokot-S Take 1 tablet by mouth at bedtime.   simvastatin  20 MG tablet Commonly known as: ZOCOR  Take 20 mg by mouth every evening.   traMADol  50 MG tablet Commonly known as: ULTRAM  Take 50 mg by mouth every 6 (six) hours as needed for severe pain (pain score 7-10).   traZODone  100 MG tablet Commonly known as: DESYREL  Take 50 mg by mouth at bedtime.   Vitamin D -3 25 MCG (1000 UT) Caps Take 1,000 Units by mouth in the morning.        Allergies:  Allergies  Allergen Reactions   Flagyl [Metronidazole] Shortness Of Breath and Swelling   Lyrica [Pregabalin] Shortness Of Breath   Adhesive [Tape] Other (See Comments)    Takes off patient's skin   Aspirin  Other (See Comments)    Stomach bleed   Latex Itching and Rash   Phenergan [Promethazine Hcl] Other (See Comments)    Caused grogginess, altered mental status   Prednisone Anxiety   Sulfa Antibiotics Nausea Only   Tetanus Toxoid-Containing Vaccines Swelling    Arm Area   Wound Dressing Adhesive Rash    Family History: Family History  Problem Relation Age of Onset   COPD Mother    Parkinson's disease Father    Stroke Father    COPD Sister    Colon cancer Brother    Lung cancer Brother    Asthma Son    Diabetes Grandchild     Social History:  reports that she has never smoked. She has never been exposed to tobacco smoke. She has never used smokeless tobacco. She reports that she does not drink alcohol  and does not use drugs.  ROS: All other review of systems were reviewed and are negative except what is noted above in HPI  Physical Exam: BP 119/69   Pulse 72   Constitutional:  Alert and oriented, No acute distress. HEENT: [place] AT, moist mucus membranes.  Trachea midline, no  masses. Cardiovascular: No clubbing, cyanosis, or edema. Respiratory: Normal respiratory effort, no increased work of breathing. GI: Abdomen is soft, nontender, nondistended, no abdominal masses GU: No CVA tenderness.  Lymph: No cervical or inguinal lymphadenopathy. Skin:  No rashes, bruises or suspicious lesions. Neurologic: Grossly intact, no focal deficits, moving all 4 extremities. Psychiatric: Normal mood and affect.  Laboratory Data: Lab Results  Component Value Date   WBC 6.7 12/27/2023   HGB 11.3 (L) 12/27/2023   HCT 34.1 (L) 12/27/2023   MCV 98.6 12/27/2023   PLT 344 12/27/2023    Lab Results  Component Value Date   CREATININE 1.41 (H) 12/27/2023    No results found for: PSA  No results found for: TESTOSTERONE  Lab Results  Component Value Date   HGBA1C 6.1 (H) 10/22/2023    Urinalysis    Component Value Date/Time   COLORURINE YELLOW 10/20/2023 1836   APPEARANCEUR CLOUDY (A) 10/20/2023 1836   APPEARANCEUR Clear 01/04/2022 1424   LABSPEC 1.017 10/20/2023 1836   PHURINE 6.0 10/20/2023 1836   GLUCOSEU NEGATIVE 10/20/2023 1836   HGBUR MODERATE (A) 10/20/2023 1836   BILIRUBINUR NEGATIVE 10/20/2023 1836   BILIRUBINUR Negative 01/04/2022 1424   KETONESUR NEGATIVE 10/20/2023 1836   PROTEINUR 100 (A) 10/20/2023 1836   NITRITE POSITIVE (A) 10/20/2023 1836   LEUKOCYTESUR LARGE (A) 10/20/2023 1836    Lab Results  Component Value Date   LABMICR See below: 10/03/2021   WBCUA 0-5 10/03/2021   LABEPIT 0-10 10/03/2021   MUCUS Present 08/18/2021   BACTERIA RARE (A) 10/20/2023    Pertinent Imaging:  Results for orders placed during the hospital encounter of 04/04/21  DG Abd 1 View  Narrative CLINICAL DATA:  Post ESWL left side 02/2021.  EXAM: ABDOMEN - 1 VIEW  COMPARISON:  KUB 02/21/2021, CT abdomen and pelvis 01/11/2021  FINDINGS: The previously seen 10 mm stone within lower pole of the left kidney was seen on the prior KUB also measuring  approximately 10 mm. This now appears to measure 6 mm following lithotripsy.  Nonobstructed bowel-gas pattern.  There is an unchanged 12 mm oval calcific density overlying the left hemipelvis which is localized posterior to the sigmoid colon on prior CT. This may represent a vascular phlebolith or less likely calcified diverticulum.  IMPRESSION:: IMPRESSION: The prior left lower pole 10 mm renal stone now appears to measure 6 mm following interval lithotripsy.   Electronically Signed By: Tanda Lyons M.D. On: 04/05/2021 09:55  No results found for this or any previous visit.  No results found for this or any previous visit.  No results found for this or any previous visit.  Results for orders placed during the hospital encounter of 05/18/21  US  RENAL  Narrative CLINICAL DATA:  Left nephrolithiasis.  EXAM: RENAL / URINARY TRACT ULTRASOUND COMPLETE  COMPARISON:  Renal CT 01/11/2021.  FINDINGS: Right Kidney:  Renal measurements: 9.8 x 4.1 x 4.7 cm = volume: 100 mL. Echogenicity within normal limits. No mass or hydronephrosis visualized.  Left Kidney:  Renal measurements: 8.2 x 4.7 x 4.2 cm = volume: 86 ML. Echogenicity within normal limits. No mass or hydronephrosis visualized. Shadowing calculi identified measuring up to 5 mm in the lower pole.  Bladder:  Appears normal for degree of bladder distention.  Other:  None.  IMPRESSION: 1. Calculi in the lower pole the left kidney measuring up to 5 mm. 2. No hydronephrosis.   Electronically Signed By: Greig Pique M.D. On: 05/19/2021 21:56  No results found for this or any previous visit.  No results found for this or any previous visit.  Results for orders placed during the hospital encounter of 01/10/21  CT RENAL STONE STUDY  Narrative CLINICAL DATA:  Flank pain.  Concern for kidney stone.  EXAM: CT ABDOMEN AND PELVIS WITHOUT CONTRAST  TECHNIQUE: Multidetector CT imaging of the abdomen  and pelvis was performed following the standard protocol without IV contrast.  COMPARISON:  CT abdomen pelvis dated 07/30/2020.  FINDINGS: Evaluation of this exam is limited in the absence of intravenous contrast.  Lower chest: The visualized lung bases are clear.  No intra-abdominal free air or free fluid.  Hepatobiliary: The liver is unremarkable. No intrahepatic biliary dilatation. Cholecystectomy.  Pancreas: Unremarkable. No pancreatic ductal dilatation or surrounding inflammatory changes.  Spleen: Several small scattered calcified splenic granuloma.  Adrenals/Urinary Tract: The adrenal glands are unremarkable. There is a 1 cm stone in the inferior pole of the left kidney with mild fullness of the inferior pole collecting systems. There is mild left perinephric stranding. The right kidney is unremarkable. The visualized ureters and urinary bladder appear unremarkable.  Stomach/Bowel: There is sigmoid diverticulosis without active inflammatory changes. Moderate stool throughout the colon. There is no bowel obstruction or active inflammation. Appendectomy.  Vascular/Lymphatic: Mild aortoiliac atherosclerotic disease. The IVC is unremarkable. No portal venous gas. There is no adenopathy.  Reproductive: Hysterectomy.  No adnexal masses.  Other: None  Musculoskeletal: Lower lumbar fusion hardware. No acute osseous pathology.  IMPRESSION: 1. A 1 cm left inferior pole renal stone with mild fullness of the inferior pole collecting systems and mild left perinephric stranding. Correlation with urinalysis recommended to exclude UTI. 2. Sigmoid diverticulosis. No bowel obstruction. 3. Aortic Atherosclerosis (ICD10-I70.0).   Electronically Signed By: Vanetta Chou M.D. On: 01/11/2021 00:46   Assessment & Plan:    1. Recurrent UTI (Primary) --We discussed the natural hx of recurrent UTIs and the various causes. We discussed the treatment options including post coital  prophylaxis, daily prophylaxis, topical estrogen therapy. We will proceed with macrodantin prophylaxis - Urinalysis, Routine w reflex microscopic - BLADDER SCAN AMB NON-IMAGING  2. Urinary incontinence, unspecified type -mirabegreon 25mg  daily  3. Nephrolithiasis -KUB today, will call with results  4. OAB (overactive bladder) We will trial mirabegron 25mg  daily   No follow-ups on file.  Belvie Clara, MD  [facility] [place]

## 2024-01-27 NOTE — Patient Instructions (Signed)

## 2024-02-03 ENCOUNTER — Telehealth: Payer: Self-pay

## 2024-02-03 DIAGNOSIS — R32 Unspecified urinary incontinence: Secondary | ICD-10-CM

## 2024-02-03 NOTE — Telephone Encounter (Signed)
 Patient needing a cheaper medication prescribed than Gemtesa  for severe incontinence.  Please advise.  Call:  [phone]

## 2024-02-10 ENCOUNTER — Other Ambulatory Visit: Payer: Self-pay

## 2024-02-10 DIAGNOSIS — D500 Iron deficiency anemia secondary to blood loss (chronic): Secondary | ICD-10-CM

## 2024-02-11 ENCOUNTER — Ambulatory Visit (INDEPENDENT_AMBULATORY_CARE_PROVIDER_SITE_OTHER): Admitting: Gastroenterology

## 2024-02-12 ENCOUNTER — Inpatient Hospital Stay: Admitting: Physician Assistant

## 2024-02-15 ENCOUNTER — Other Ambulatory Visit: Payer: Self-pay | Admitting: Physician Assistant

## 2024-02-15 DIAGNOSIS — D631 Anemia in chronic kidney disease: Secondary | ICD-10-CM

## 2024-02-15 DIAGNOSIS — D649 Anemia, unspecified: Secondary | ICD-10-CM

## 2024-02-15 NOTE — Progress Notes (Deleted)
 [facility] [address], [place] [address]   CLINIC:  Medical Oncology/Hematology  PCP:  Kip Righter, MD [address] [place] [address] [phone]   REASON FOR VISIT:  Follow-up for normocytic anemia (CKD and IDA)  CURRENT THERAPY: Intermittent IV iron   INTERVAL HISTORY:   Heather Espinoza 78 y.o. female returns for routine follow-up of normocytic anemia She was last seen by NP Wanda Bud on 12/27/2023.  In the interim since last visit, she had ***. She  denies any ***other*** recent surgeries, hospitalizations, or changes in baseline health status.  *** At today's visit, she reports feeling ***.  She  reports ***% energy and ***% appetite.   ***She  is maintaining stable weight at this time.  *** Bleeding *** Fatigue, pica *** Headaches, lightheadedness, syncope *** Chest pain, dyspnea on exertion ***Iron  tablet (GI upset)?  Despite receiving IV iron  and having adequate iron  levels, she has continued to have fatigue, headaches, and dyspnea.  ***  ASSESSMENT & PLAN:  1.  Normocytic anemia # Iron  deficiency anemia # Anemia of CKD stage IIIa/b - Persistent anemia dating back to at least 2020, with history of CKD dating back to at least 2020 as well.  Has never required blood transfusion. - EGD (10/26/2022): *** - Colonoscopy (07/24/2019): *** - Difficulty tolerating oral iron  due to gastric upset. - Hospitalized in August 2025 for sepsis due to UTI.  During her hospitalization, she was noted to have worsening anemia and she received 1 dose of Venofer  during that hospitalization. She also received Feraheme 510 mg IV on 10/31/2023 and another dose on 11/08/2023 (via outpatient infusion clinic) - Stool for occult blood was NEGATIVE (10/22/2023) - Hematology workup for other causes of anemia (12/27/2023): Normal folate, B12, MMA, copper . Haptoglobin and LDH.  Normal reticulocytes. CMP with creatinine 1.41/GFR 38 (CKD stage IIIb) -  Denies any rectal bleeding or melena *** - Symptoms *** - Labs today (***): *** - PLAN: *** TBD *** - We will check SPEP, immunofixation, and light chains for completion of myeloma/MGUS workup in the setting of normocytic anemia.  ***  2.  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 There were no vitals filed for this visit. Physical Exam  PAST MEDICAL/SURGICAL HISTORY:  Past Medical History:  Diagnosis Date   Alpha-1-antitrypsin deficiency (HCC)    Anemia    Arthritis    COPD (chronic obstructive pulmonary disease) (HCC)    COVID-19 11/11/2020   Depression    Diabetes (HCC)    Diverticulitis    Diverticulosis    Dyspnea    Dysrhythmia    Family history of adverse reaction to anesthesia    sister has trouble waking up, requires additional oxygen   Fibromyalgia    GERD (gastroesophageal reflux disease)    Headache    High cholesterol    History of kidney stones    Hypertension    Hypothyroid    IBS (irritable bowel syndrome)    Osteopenia    Pneumonia    Restless leg    Sleep apnea    Doesn't use CPAP.     Tachycardia  per pt/fim   Tremor, essential 01/07/2018   Past Surgical History:  Procedure Laterality Date   ABDOMINAL HYSTERECTOMY     ANTERIOR CERVICAL DECOMPRESSION/DISCECTOMY FUSION 4 LEVELS N/A 01/31/2023   Procedure: ACDF,IP,PLATE/SCREWS R65, C45, C56, C67;  Surgeon: Mavis Purchase, MD;  Location: [facility] OR;  Service: Neurosurgery;  Laterality: N/A;   APPENDECTOMY     BACK SURGERY     BIOPSY  12/25/2016   Procedure: BIOPSY;  Surgeon: Golda Claudis PENNER, MD;   [facility]: [facility];  Service: Endoscopy;;  gastric    BIOPSY  10/26/2022   Procedure: BIOPSY;  Surgeon: Eartha Angelia Sieving, MD;  Location: [facility];  Service: Gastroenterology;;   BREAST LUMPECTOMY Right 03/20/1999   CARDIAC CATHETERIZATION  05/28/2000   CATARACT EXTRACTION Bilateral 03/19/2014   CATARACT EXTRACTION Bilateral    CHOLECYSTECTOMY     COLONOSCOPY N/A 12/17/2012   Procedure: COLONOSCOPY;  Surgeon: Claudis PENNER Golda, MD;  Location: [facility];  Service: Endoscopy;  Laterality: N/A;  215   COLONOSCOPY WITH PROPOFOL  N/A 07/24/2019   Procedure: COLONOSCOPY WITH PROPOFOL ;  Surgeon: Golda Claudis PENNER, MD;  [facility]: [facility];  Service: Endoscopy;  Laterality: N/A;  730   complete hysterectomy     DRUG INDUCED ENDOSCOPY N/A 09/02/2020   Procedure: DRUG INDUCED ENDOSCOPY;  Surgeon: Mable Lenis, MD;  Location: [facility];  Service: ENT;  Laterality: N/A;   ESOPHAGEAL DILATION N/A 12/25/2016   Procedure: ESOPHAGEAL DILATION;  Surgeon: Golda Claudis PENNER, MD;  [facility];  Service: Endoscopy;  Laterality: N/A;   ESOPHAGEAL DILATION N/A 07/24/2019   Procedure: ESOPHAGEAL DILATION;  Surgeon: Golda Claudis PENNER, MD;  [facility]: [facility];  Service: Endoscopy;  Laterality: N/A;   ESOPHAGEAL DILATION N/A 08/02/2021   Procedure: ESOPHAGEAL DILATION;  Surgeon: Golda Claudis PENNER, MD;  [facility];  Service: Endoscopy;  Laterality: N/A;   ESOPHAGOGASTRODUODENOSCOPY N/A 12/25/2016   Procedure: ESOPHAGOGASTRODUODENOSCOPY (EGD);  Surgeon: Golda Claudis PENNER, MD;  [facility];  Service: Endoscopy;  Laterality: N/A;  730   ESOPHAGOGASTRODUODENOSCOPY (EGD) WITH PROPOFOL  N/A 07/24/2019   Procedure: ESOPHAGOGASTRODUODENOSCOPY (EGD) WITH PROPOFOL ;  Surgeon: Golda Claudis PENNER, MD;  [facility];  Service: Endoscopy;  Laterality: N/A;   ESOPHAGOGASTRODUODENOSCOPY (EGD) WITH PROPOFOL  N/A 08/02/2021   Procedure:  ESOPHAGOGASTRODUODENOSCOPY (EGD) WITH PROPOFOL ;  Surgeon: Golda Claudis PENNER, MD;  [facility];  Service: Endoscopy;  Laterality: N/A;  1245 ASA 2   ESOPHAGOGASTRODUODENOSCOPY (EGD) WITH PROPOFOL  N/A 10/26/2022   Procedure: ESOPHAGOGASTRODUODENOSCOPY (EGD) WITH PROPOFOL ;  Surgeon: Eartha Angelia Sieving, MD;  [facility];  Service: Gastroenterology;  Laterality: N/A;  11:00AM;ASA 3   EXTRACORPOREAL SHOCK WAVE LITHOTRIPSY Left 02/21/2021   Procedure: EXTRACORPOREAL SHOCK WAVE LITHOTRIPSY (ESWL);  Surgeon: Roseann Adine PARAS., MD;  Location: [facility];  Service: Urology;  Laterality: Left;   Heel tumor removed     IMPLANTATION OF HYPOGLOSSAL NERVE STIMULATOR Right 11/25/2020   Procedure: IMPLANTATION OF HYPOGLOSSAL NERVE STIMULATOR;  Surgeon: Mable Lenis, MD;  Location: [facility];  Service: ENT;  Laterality: Right;   POLYPECTOMY  07/24/2019   Procedure: POLYPECTOMY;  Surgeon: Golda Claudis PENNER, MD;  [facility];  Service: Endoscopy;;   rt elbow surgery     SAVORY DILATION  10/26/2022   Procedure: SAVORY DILATION;  Surgeon: Eartha Angelia Sieving, MD;  [facility];  Service: Gastroenterology;;   THUMB FUSION Bilateral    TONSILLECTOMY     TRIGGER FINGER RELEASE  Right and Left, Dr. Shari, 2023/2024    SOCIAL HISTORY:  Social History   Socioeconomic History   Marital status: Married    Spouse name: Not on file   Number of children: 2   Years of education: College   Highest education level: Associate degree: academic program  Occupational History   Occupation: Retired  Tobacco Use   Smoking status: Never    Passive exposure: Never   Smokeless tobacco: Never  Vaping Use   Vaping status: Never Used  Substance and Sexual Activity   Alcohol  use: No    Alcohol /week: 0.0 standard drinks of alcohol    Drug use: Never   Sexual activity: Not Currently    Birth control/protection: Abstinence  Other Topics Concern   Not on file   Social History Narrative   Lives at home w/ her husband   Right-handed   Occasional caffeine: decaf tea, diet sodas 2 daily   Social Drivers of Corporate Investment Banker Strain: Not on file  Food Insecurity: No Food Insecurity (10/20/2023)   Hunger Vital Sign    Worried About Running Out of Food in the Last Year: Never true    Ran Out of Food in the Last Year: Never true  Transportation Needs: No Transportation Needs (10/20/2023)   PRAPARE - Administrator, Civil Service (Medical): No    Lack of Transportation (Non-Medical): No  Physical Activity: Not on file  Stress: Not on file  Social Connections: Unknown (10/20/2023)   Social Connection and Isolation Panel    Frequency of Communication with Friends and Family: More than three times a week    Frequency of Social Gatherings with Friends and Family: More than three times a week    Attends Religious Services: Patient declined    Database Administrator or Organizations: Patient declined    Attends Banker Meetings: Patient declined    Marital Status: Married  Catering Manager Violence: Not At Risk (10/20/2023)   Humiliation, Afraid, Rape, and Kick questionnaire    Fear of Current or Ex-Partner: No    Emotionally Abused: No    Physically Abused: No    Sexually Abused: No    FAMILY HISTORY:  Family History  Problem Relation Age of Onset   COPD Mother    Parkinson's disease Father    Stroke Father    COPD Sister    Colon cancer Brother    Lung cancer Brother    Asthma Son    Diabetes Grandchild     CURRENT MEDICATIONS:  Outpatient Encounter Medications as of 02/17/2024  Medication Sig   albuterol  (VENTOLIN  HFA) 108 (90 Base) MCG/ACT inhaler Inhale 2 puffs into the lungs every 6 (six) hours as needed for wheezing or shortness of breath.   aspirin  EC 81 MG tablet Take 81 mg by mouth every 6 (six) hours as needed for moderate pain (pain score 4-6). Swallow whole.   Biotin  w/ Vitamins C & E (HAIR SKIN &  NAILS GUMMIES PO) Take 2 tablets by mouth in the morning and at bedtime.   Cholecalciferol  (VITAMIN D -3) 25 MCG (1000 UT) CAPS Take 1,000 Units by mouth in the morning. (Patient not taking: Reported on 12/27/2023)   Cyanocobalamin  (B-12) 500 MCG TABS Take 500 mcg by mouth in the morning. (Patient not taking: Reported on 12/27/2023)   cyclobenzaprine  (FLEXERIL ) 5 MG tablet Take 1 tablet (5 mg total) by mouth 3 (three) times daily as needed for muscle spasms.   diclofenac Sodium (VOLTAREN)  1 % GEL Apply 4 g topically 4 (four) times daily.   dicyclomine  (BENTYL ) 10 MG capsule TAKE 1 CAPSULE BY MOUTH THREE TIMES DAILY BEFORE MEAL(S)   DULoxetine  (CYMBALTA ) 60 MG capsule Take 60 mg by mouth in the morning. (Patient not taking: Reported on 12/27/2023)   famotidine  (PEPCID ) 40 MG tablet TAKE 1 TABLET BY MOUTH AT BEDTIME   Fluticasone -Salmeterol (ADVAIR) 250-50 MCG/DOSE AEPB Inhale 1 puff into the lungs every 12 (twelve) hours.   gabapentin  (NEURONTIN ) 100 MG capsule Take 100 mg by mouth daily. (Patient not taking: Reported on 12/27/2023)   iron  polysaccharides (FERREX 150) 150 MG capsule Take 1 capsule by mouth once daily (Patient not taking: Reported on 12/27/2023)   levothyroxine  (SYNTHROID , LEVOTHROID) 75 MCG tablet Take 37.5 mcg by mouth daily before breakfast.   lisinopril  (PRINIVIL ,ZESTRIL ) 10 MG tablet Take 10 mg by mouth in the morning.   loperamide (IMODIUM A-D) 2 MG tablet Take 2 mg by mouth 4 (four) times daily as needed for diarrhea or loose stools.   metFORMIN  (GLUCOPHAGE -XR) 500 MG 24 hr tablet Take 500 mg by mouth daily before supper.   mirabegron  ER (MYRBETRIQ ) 25 MG TB24 tablet Take 1 tablet (25 mg total) by mouth daily.   neomycin-bacitracin -polymyxin (NEOSPORIN) ointment Apply 1 application topically as needed for wound care.   nitrofurantoin  (MACRODANTIN ) 50 MG capsule Take 1 capsule (50 mg total) by mouth at bedtime.   omeprazole  (PRILOSEC) 40 MG capsule Take 1 capsule (40 mg total)  by mouth in the morning and at bedtime.   ondansetron  (ZOFRAN ) 4 MG tablet TAKE 1 TABLET BY MOUTH EVERY 8 HOURS AS NEEDED FOR NAUSEA OR VOMITING   senna-docusate (SENOKOT-S) 8.6-50 MG tablet Take 1 tablet by mouth at bedtime.   simvastatin  (ZOCOR ) 20 MG tablet Take 20 mg by mouth every evening.   traMADol  (ULTRAM ) 50 MG tablet Take 50 mg by mouth every 6 (six) hours as needed for severe pain (pain score 7-10).   traZODone  (DESYREL ) 100 MG tablet Take 50 mg by mouth at bedtime.   No facility-administered encounter medications on file as of 02/17/2024.    ALLERGIES:  Allergies  Allergen Reactions   Flagyl [Metronidazole] Shortness Of Breath and Swelling   Lyrica [Pregabalin] Shortness Of Breath   Adhesive [Tape] Other (See Comments)    Takes off patient's skin   Aspirin  Other (See Comments)    Stomach bleed   Latex Itching and Rash   Phenergan [Promethazine Hcl] Other (See Comments)    Caused grogginess, altered mental status   Prednisone Anxiety   Sulfa Antibiotics Nausea Only   Tetanus Toxoid-Containing Vaccines Swelling    Arm Area   Wound Dressing Adhesive Rash    LABORATORY DATA:  I have reviewed the labs as listed.  CBC    Component Value Date/Time   WBC 6.7 12/27/2023 1138   RBC 3.49 (L) 12/27/2023 1139   RBC 3.46 (L) 12/27/2023 1138   HGB 11.3 (L) 12/27/2023 1138   HCT 34.1 (L) 12/27/2023 1138   PLT 344 12/27/2023 1138   MCV 98.6 12/27/2023 1138   MCH 32.7 12/27/2023 1138   MCHC 33.1 12/27/2023 1138   RDW 12.3 12/27/2023 1138   LYMPHSABS 2.4 12/27/2023 1138   MONOABS 0.7 12/27/2023 1138   EOSABS 0.2 12/27/2023 1138   BASOSABS 0.1 12/27/2023 1138      Latest Ref Rng & Units 12/27/2023   11:38 AM 10/22/2023    4:43 AM 10/21/2023    4:14 AM  CMP  Glucose 70 - 99 mg/dL 884  894  873   BUN 8 - 23 mg/dL 20  11  15    Creatinine 0.44 - 1.00 mg/dL 8.58  8.93  8.85   Sodium 135 - 145 mmol/L 142  136  139   Potassium 3.5 - 5.1 mmol/L 4.6  3.7  3.7   Chloride 98 - 111  mmol/L 104  106  112   CO2 22 - 32 mmol/L 22  23  20    Calcium 8.9 - 10.3 mg/dL 89.9  8.2  7.8   Total Protein 6.5 - 8.1 g/dL 7.6     Total Bilirubin 0.0 - 1.2 mg/dL 0.3     Alkaline Phos 38 - 126 U/L 47     AST 15 - 41 U/L 23     ALT 0 - 44 U/L 17       DIAGNOSTIC IMAGING:  I have independently reviewed the relevant imaging and discussed with the patient.   WRAP UP:  All questions were answered. The patient knows to call the clinic with any problems, questions or concerns.  Medical decision making: ***  Time spent on visit: I spent *** minutes counseling the patient face to face. The total time spent in the appointment was *** minutes and more than 50% was on counseling.  Pleasant CHRISTELLA Barefoot, PA-C  ***

## 2024-02-17 ENCOUNTER — Inpatient Hospital Stay: Admitting: Physician Assistant

## 2024-02-17 ENCOUNTER — Inpatient Hospital Stay

## 2024-02-25 MED ORDER — GEMTESA 75 MG PO TABS: 1.0000 | ORAL_TABLET | Freq: Every day | ORAL | Status: AC

## 2024-02-25 NOTE — Telephone Encounter (Signed)
 Pt is made aware and voiced understanding Keep giving her samples of gemtesa 

## 2024-03-02 ENCOUNTER — Telehealth: Payer: Self-pay

## 2024-03-02 ENCOUNTER — Ambulatory Visit (INDEPENDENT_AMBULATORY_CARE_PROVIDER_SITE_OTHER): Admitting: Gastroenterology

## 2024-03-02 ENCOUNTER — Encounter (INDEPENDENT_AMBULATORY_CARE_PROVIDER_SITE_OTHER): Payer: Self-pay | Admitting: Gastroenterology

## 2024-03-02 VITALS — BP 143/76 | HR 74 | Temp 97.1°F | Ht 61.5 in | Wt 121.8 lb

## 2024-03-02 DIAGNOSIS — K582 Mixed irritable bowel syndrome: Secondary | ICD-10-CM | POA: Diagnosis not present

## 2024-03-02 DIAGNOSIS — K219 Gastro-esophageal reflux disease without esophagitis: Secondary | ICD-10-CM | POA: Diagnosis not present

## 2024-03-02 DIAGNOSIS — D509 Iron deficiency anemia, unspecified: Secondary | ICD-10-CM

## 2024-03-02 MED ORDER — DICYCLOMINE HCL 10 MG PO CAPS: 10.0000 mg | ORAL_CAPSULE | Freq: Three times a day (TID) | ORAL | 2 refills | Status: AC | PRN

## 2024-03-02 NOTE — Telephone Encounter (Signed)
 Patient is here to pick up Gemtesa  samples.

## 2024-03-02 NOTE — Progress Notes (Unsigned)
 Referring Provider: Kip Righter, MD Primary Care Physician:  Kip Righter, MD Primary GI Physician: Dr. Eartha  Chief Complaint  Patient presents with   Follow-up    Patient still having some constipation and diarrhea, patient states when constipated she is in severe pain. Reflux is doing a lot better but patient states she still has some depending on what she eats.    HPI:   Heather Espinoza is a 78 y.o. female with past medical history of anemia, COPD, DM, diverticulitis, Fibromyalgia, high cholesterol, HTN, IBS, osteopenia, RLS, IDA  Patient presenting today for:  Follow up of GERD, IBS and IDA  Last seen September, at that time labs on 8/4 and 8/5 with hgb 8, FOBT negative, ferritin 58, iron  6 TIBC 286 saturation 2 folate 12 B12 449, she endorsed recent admission for sepsis. 2 iron  infusions in August without much improvement in energy levels. Nto taking iron  pills. Not seeing hematology. Taking omeprazole  and famotidine , some breakthrough depending what she eats. Taking senokot with 2-3 BMs some day and sometimes a few days without one. Abdominal cramping at times   Recommended repeat CBC, iron  studies, consider updating colonoscopy/possible capsule study if IDA persists, increase omeprazole  40mg  to BID, continue famotidine  40mg  at bedtime, continue bentyl  PRN, continue sennokot, referral to hematology (labs in September not completed)  Seen by hematology in October, with further labs done, consider additional IV iron  if labs show continued IDA  Labs on 10/10 Folate 8.4 B 12 898  MMA 180  LDH 163  Copper  92  CMP with creat 1.41, CBC with hgb 11.3  Reticulocyte count normal Ferritin 858, Iron  138 sat 45 TIBC 307   Present:  States she had issues with getting [facility] after [facility] last visit. She was supposed to have labs again with hematology as they had to reschedule this so she has not had recent. Not currently taking any iron  pills. She has some fatigue and sob at  times, occasional dizziness. No rectal bleeding or melena.   Still having mix of constipation and diarrhea. She is taking senokot as stools are generally large and hard but when she takes senokot she will have diarrhea for a few days and then need to take imodium. sometimes Sometimes she repeats senokot dose if stools are very hard. Water  intake is not good. She Is taking tramadol  PRN due to pinched nerve in her neck and some left arm pain. She takes bentyl  TID which seems to help.   GERD is doing well on omeprazole  40mg  BID, and Famotidine  40mg  at bedtime, feels things are well controlled but she has to really watch what she eats or she can have some breakthrough.   Last EGD 10/2022: - No endoscopic esophageal abnormality to explain                            patient's dysphagia. Esophagus dilated. Dilated.                           - 1 cm hiatal hernia.                           - Normal stomach. Biopsied.                           - Mucosal nodule found in the duodenum. Biopsied.  A. SMALL BOWEL, BIOPSY:  - Benign small bowel mucosa with foveolar metaplasia, suggestive of  peptic injury   B. STOMACH, BIOPSY:  - Mild reactive gastropathy.  - Negative for H. pylori on HE stain  - No intestinal metaplasia, dysplasia, or malignancy.    Last Colonoscopy: 07/24/19- Decreased sphincter tone found on digital rectal exam. - Diverticulosis in the entire examined colon. - One 10 mm polyp at the hepatic flexure, removed piecemeal using a cold snare. Complete resection. Partial retrieval. Clips (MR conditional) were placed. Tubular adenoma - One small polyp at the splenic flexure, removed with a cold snare. Resected and retrieved. -benign colonic mucosa - External hemorrhoids. esophagram April 2023: Protrusion of cricopharyngeus muscle noted with swallowing. Implanted device in place to upper right chest.    Recommendations:  Repeat TCS 5 years   Past Medical History:  Diagnosis Date    Alpha-1-antitrypsin deficiency (HCC)    Anemia    Arthritis    COPD (chronic obstructive pulmonary disease) (HCC)    COVID-19 11/11/2020   Depression    Diabetes (HCC)    Diverticulitis    Diverticulosis    Dyspnea    Dysrhythmia    Family history of adverse reaction to anesthesia    sister has trouble waking up, requires additional oxygen   Fibromyalgia    GERD (gastroesophageal reflux disease)    Headache    High cholesterol    History of kidney stones    Hypertension    Hypothyroid    IBS (irritable bowel syndrome)    Osteopenia    Pneumonia    Restless leg    Sleep apnea    Doesn't use CPAP.     Tachycardia    per pt/fim   Tremor, essential 01/07/2018    Past Surgical History:  Procedure Laterality Date   ABDOMINAL HYSTERECTOMY     ANTERIOR CERVICAL DECOMPRESSION/DISCECTOMY FUSION 4 LEVELS N/A 01/31/2023   Procedure: ACDF,IP,PLATE/SCREWS R65, C45, C56, C67;  Surgeon: Mavis Purchase, MD;  Location: [facility];  Service: Neurosurgery;  Laterality: N/A;   APPENDECTOMY     BACK SURGERY     BIOPSY  12/25/2016   Procedure: BIOPSY;  Surgeon: Golda Claudis PENNER, MD;  [facility];  Service: Endoscopy;;  gastric    BIOPSY  10/26/2022   Procedure: BIOPSY;  Surgeon: Eartha Angelia Sieving, MD;  [facility];  Service: Gastroenterology;;   BREAST LUMPECTOMY Right 03/20/1999   CARDIAC CATHETERIZATION  05/28/2000   CATARACT EXTRACTION Bilateral 03/19/2014   CATARACT EXTRACTION Bilateral    CHOLECYSTECTOMY     COLONOSCOPY N/A 12/17/2012   Procedure: COLONOSCOPY;  Surgeon: Claudis PENNER Golda, MD;  [facility];  Service: Endoscopy;  Laterality: N/A;  215   COLONOSCOPY WITH PROPOFOL  N/A 07/24/2019   Procedure: COLONOSCOPY WITH PROPOFOL ;  Surgeon: Golda Claudis PENNER, MD;  [facility];  Service: Endoscopy;  Laterality: N/A;  730   complete hysterectomy     DRUG INDUCED ENDOSCOPY N/A 09/02/2020   Procedure: DRUG INDUCED ENDOSCOPY;  Surgeon:  Mable Lenis, MD;  Location: [facility];  Service: ENT;  Laterality: N/A;   ESOPHAGEAL DILATION N/A 12/25/2016   Procedure: ESOPHAGEAL DILATION;  Surgeon: Golda Claudis PENNER, MD;  [facility];  Service: Endoscopy;  Laterality: N/A;   ESOPHAGEAL DILATION N/A 07/24/2019   Procedure: ESOPHAGEAL DILATION;  Surgeon: Golda Claudis PENNER, MD;  [facility];  Service: Endoscopy;  Laterality: N/A;   ESOPHAGEAL DILATION N/A 08/02/2021  Procedure: ESOPHAGEAL DILATION;  Surgeon: Golda Claudis PENNER, MD;  [facility];  Service: Endoscopy;  Laterality: N/A;   ESOPHAGOGASTRODUODENOSCOPY N/A 12/25/2016   Procedure: ESOPHAGOGASTRODUODENOSCOPY (EGD);  Surgeon: Golda Claudis PENNER, MD;  [facility];  Service: Endoscopy;  Laterality: N/A;  730   ESOPHAGOGASTRODUODENOSCOPY (EGD) WITH PROPOFOL  N/A 07/24/2019   Procedure: ESOPHAGOGASTRODUODENOSCOPY (EGD) WITH PROPOFOL ;  Surgeon: Golda Claudis PENNER, MD;  [facility];  Service: Endoscopy;  Laterality: N/A;   ESOPHAGOGASTRODUODENOSCOPY (EGD) WITH PROPOFOL  N/A 08/02/2021   Procedure: ESOPHAGOGASTRODUODENOSCOPY (EGD) WITH PROPOFOL ;  Surgeon: Golda Claudis PENNER, MD;  [facility];  Service: Endoscopy;  Laterality: N/A;  1245 ASA 2   ESOPHAGOGASTRODUODENOSCOPY (EGD) WITH PROPOFOL  N/A 10/26/2022   Procedure: ESOPHAGOGASTRODUODENOSCOPY (EGD) WITH PROPOFOL ;  Surgeon: Eartha Angelia Sieving, MD;  [facility];  Service: Gastroenterology;  Laterality: N/A;  11:00AM;ASA 3   EXTRACORPOREAL SHOCK WAVE LITHOTRIPSY Left 02/21/2021   Procedure: EXTRACORPOREAL SHOCK WAVE LITHOTRIPSY (ESWL);  Surgeon: Roseann Adine PARAS., MD;  Location: [facility];  Service: Urology;  Laterality: Left;   Heel tumor removed     IMPLANTATION OF HYPOGLOSSAL NERVE STIMULATOR Right 11/25/2020   Procedure: IMPLANTATION OF HYPOGLOSSAL NERVE STIMULATOR;  Surgeon: Mable Lenis, MD;  Location: [facility] OR;  Service: ENT;   Laterality: Right;   POLYPECTOMY  07/24/2019   Procedure: POLYPECTOMY;  Surgeon: Golda Claudis PENNER, MD;  [facility];  Service: Endoscopy;;   rt elbow surgery     SAVORY DILATION  10/26/2022   Procedure: SAVORY DILATION;  Surgeon: Eartha Angelia Sieving, MD;  [facility];  Service: Gastroenterology;;   THUMB FUSION Bilateral    TONSILLECTOMY     TRIGGER FINGER RELEASE     Right and Left, Dr. Shari, 2023/2024    Current Outpatient Medications  Medication Sig Dispense Refill   albuterol  (VENTOLIN  HFA) 108 (90 Base) MCG/ACT inhaler Inhale 2 puffs into the lungs every 6 (six) hours as needed for wheezing or shortness of breath. 8 g 1   aspirin  EC 81 MG tablet Take 81 mg by mouth every 6 (six) hours as needed for moderate pain (pain score 4-6). Swallow whole.     Biotin  w/ Vitamins C & E (HAIR SKIN & NAILS GUMMIES PO) Take 2 tablets by mouth in the morning and at bedtime.     Cholecalciferol  (VITAMIN D -3) 25 MCG (1000 UT) CAPS Take 1,000 Units by mouth in the morning. (Patient not taking: Reported on 12/27/2023)     Cyanocobalamin  (B-12) 500 MCG TABS Take 500 mcg by mouth in the morning. (Patient not taking: Reported on 12/27/2023)     cyclobenzaprine  (FLEXERIL ) 5 MG tablet Take 1 tablet (5 mg total) by mouth 3 (three) times daily as needed for muscle spasms. 30 tablet 0   diclofenac Sodium (VOLTAREN) 1 % GEL Apply 4 g topically 4 (four) times daily.     dicyclomine  (BENTYL ) 10 MG capsule TAKE 1 CAPSULE BY MOUTH THREE TIMES DAILY BEFORE MEAL(S) 180 capsule 0   DULoxetine  (CYMBALTA ) 60 MG capsule Take 60 mg by mouth in the morning. (Patient not taking: Reported on 12/27/2023)     famotidine  (PEPCID ) 40 MG tablet TAKE 1 TABLET BY MOUTH AT BEDTIME 90 tablet 3   Fluticasone -Salmeterol (ADVAIR) 250-50 MCG/DOSE AEPB Inhale 1 puff into the lungs every 12 (twelve) hours.     gabapentin  (NEURONTIN ) 100 MG capsule Take 100 mg by mouth daily. (Patient not taking: Reported on  12/27/2023)     iron   polysaccharides (FERREX 150) 150 MG capsule Take 1 capsule by mouth once daily (Patient not taking: Reported on 12/27/2023) 60 capsule 2   levothyroxine  (SYNTHROID , LEVOTHROID) 75 MCG tablet Take 37.5 mcg by mouth daily before breakfast.     lisinopril  (PRINIVIL ,ZESTRIL ) 10 MG tablet Take 10 mg by mouth in the morning.     loperamide (IMODIUM A-D) 2 MG tablet Take 2 mg by mouth 4 (four) times daily as needed for diarrhea or loose stools.     metFORMIN  (GLUCOPHAGE -XR) 500 MG 24 hr tablet Take 500 mg by mouth daily before supper.     neomycin-bacitracin -polymyxin (NEOSPORIN) ointment Apply 1 application topically as needed for wound care.     nitrofurantoin  (MACRODANTIN ) 50 MG capsule Take 1 capsule (50 mg total) by mouth at bedtime. 30 capsule 11   omeprazole  (PRILOSEC) 40 MG capsule Take 1 capsule (40 mg total) by mouth in the morning and at bedtime. 180 capsule 3   ondansetron  (ZOFRAN ) 4 MG tablet TAKE 1 TABLET BY MOUTH EVERY 8 HOURS AS NEEDED FOR NAUSEA OR VOMITING 30 tablet 5   senna-docusate (SENOKOT-S) 8.6-50 MG tablet Take 1 tablet by mouth at bedtime. 30 tablet 1   simvastatin  (ZOCOR ) 20 MG tablet Take 20 mg by mouth every evening.     traMADol  (ULTRAM ) 50 MG tablet Take 50 mg by mouth every 6 (six) hours as needed for severe pain (pain score 7-10).     traZODone  (DESYREL ) 100 MG tablet Take 50 mg by mouth at bedtime.     Vibegron  (GEMTESA ) 75 MG TABS Take 1 tablet (75 mg total) by mouth daily.     No current facility-administered medications for this visit.    Allergies as of 03/02/2024 - Review Complete 01/27/2024  Allergen Reaction Noted   Flagyl [metronidazole] Shortness Of Breath and Swelling 10/07/2015   Lyrica [pregabalin] Shortness Of Breath 01/04/2015   Adhesive [tape] Other (See Comments) 12/02/2012   Aspirin  Other (See Comments) 11/24/2012   Latex Itching and Rash 11/24/2012   Phenergan [promethazine hcl] Other (See Comments) 10/07/2015   Prednisone  Anxiety 11/24/2012   Sulfa antibiotics Nausea Only 10/07/2015   Tetanus toxoid-containing vaccines Swelling 11/24/2012   Wound dressing adhesive Rash 05/07/2021    Social History   Socioeconomic History   Marital status: Married    Spouse name: Not on file   Number of children: 2   Years of education: Automotive Engineer   Highest education level: Associate degree: academic program  Occupational History   Occupation: Retired  Tobacco Use   Smoking status: Never    Passive exposure: Never   Smokeless tobacco: Never  Vaping Use   Vaping status: Never Used  Substance and Sexual Activity   Alcohol  use: No    Alcohol /week: 0.0 standard drinks of alcohol    Drug use: Never   Sexual activity: Not Currently    Birth control/protection: Abstinence  Other Topics Concern   Not on file  Social History Narrative   Lives at home w/ her husband   Right-handed   Occasional caffeine: decaf tea, diet sodas 2 daily   Social Drivers of Health   Tobacco Use: Low Risk (01/27/2024)   Patient History    Smoking Tobacco Use: Never    Smokeless Tobacco Use: Never    Passive Exposure: Never  Financial Resource Strain: Not on file  Food Insecurity: No Food Insecurity (10/20/2023)   Epic    Worried About Radiation Protection Practitioner of Food in the Last Year: Never true    Ran  Out of Food in the Last Year: Never true  Transportation Needs: No Transportation Needs (10/20/2023)   Epic    Lack of Transportation (Medical): No    Lack of Transportation (Non-Medical): No  Physical Activity: Not on file  Stress: Not on file  Social Connections: Unknown (10/20/2023)   Social Connection and Isolation Panel    Frequency of Communication with Friends and Family: More than three times a week    Frequency of Social Gatherings with Friends and Family: More than three times a week    Attends Religious Services: Patient declined    Active Member of Clubs or Organizations: Patient declined    Attends Banker Meetings: Patient  declined    Marital Status: Married  Depression (PHQ2-9): Medium Risk (12/27/2023)   Depression (PHQ2-9)    PHQ-2 Score: 10  Alcohol  Screen: Not on file  Housing: Unknown (10/21/2023)   Epic    Unable to Pay for Housing in the Last Year: Not on file    Number of Times Moved in the Last Year: 1    Homeless in the Last Year: Not on file  Utilities: Not At Risk (10/20/2023)   Epic    Threatened with loss of utilities: No  Health Literacy: Not on file    Review of systems General: negative for malaise, night sweats, fever, chills, weight los Neck: Negative for lumps, goiter, pain and significant neck swelling Resp: Negative for cough, wheezing, dyspnea at rest CV: Negative for chest pain, leg swelling, palpitations, orthopnea GI: denies melena, hematochezia, nausea, vomiting, diarrhea, constipation, dysphagia, odyonophagia, early satiety or unintentional weight loss.  MSK: Negative for joint pain or swelling, back pain, and muscle pain. Derm: Negative for itching or rash Psych: Denies depression, anxiety, memory loss, confusion. No homicidal or suicidal ideation.  Heme: Negative for prolonged bleeding, bruising easily, and swollen nodes. Endocrine: Negative for cold or heat intolerance, polyuria, polydipsia and goiter. Neuro: negative for tremor, gait imbalance, syncope and seizures. The remainder of the review of systems is noncontributory.  Physical Exam: There were no vitals taken for this visit. General:   Alert and oriented. No distress noted. Pleasant and cooperative.  Head:  Normocephalic and atraumatic. Eyes:  Conjuctiva clear without scleral icterus. Mouth:  Oral mucosa pink and moist. Good dentition. No lesions. Heart: Normal rate and rhythm, s1 and s2 heart sounds present.  Lungs: Clear lung sounds in all lobes. Respirations equal and unlabored. Abdomen:  +BS, soft, non-tender and non-distended. No rebound or guarding. No HSM or masses noted. Derm: No palmar erythema or  jaundice Msk:  Symmetrical without gross deformities. Normal posture. Extremities:  Without edema. Neurologic:  Alert and  oriented x4 Psych:  Alert and cooperative. Normal mood and affect.  Invalid input(s): 6 MONTHS   ASSESSMENT: Heather Espinoza is a 78 y.o. female presenting today      PLAN:  -continue to follow with hematology -continue omeprazole  40mg  BID, famotidine  at bedtime -good reflux precautions  -repeat h&h, Iron  panel  -colonoscopy due in early 2026, pursue sooner if IDA present on next labs  -start metamucil BID -continue bentyl  10mg  TID PRN -Increase water  intake, aim for atleast 64 oz per day Increase fruits, veggies and whole grains, kiwi and prunes are especially good for constipation  All questions were answered, patient verbalized understanding and is in agreement with plan as outlined above.   Follow Up: 3 months   Muaaz Brau L. Tilmon Wisehart, MSN, APRN, AGNP-C Adult-Gerontology Nurse Practitioner [facility]

## 2024-03-02 NOTE — Telephone Encounter (Signed)
Samples given in office

## 2024-03-02 NOTE — Patient Instructions (Signed)
-  continue to follow with hematology -continue omeprazole  40mg  twice a day, famotidine  at bedtime -good reflux precautions  -repeat hemoglobin and Iron  panel  -start metamucil twice a day -continue bentyl  10mg  three times a day as needed for abdominal pain -Increase water  intake, aim for atleast 64 oz per day Increase fruits, veggies and whole grains, kiwi and prunes are especially good for constipation  Follow up 3 months  It was a pleasure to see you today. I want to create trusting relationships with patients and provide genuine, compassionate, and quality care. I truly value your feedback! please be on the lookout for a survey regarding your visit with me today. I appreciate your input about our visit and your time in completing this!    Heather Blum L. Annjeanette Sarwar, MSN, APRN, AGNP-C Adult-Gerontology Nurse Practitioner [facility]

## 2024-03-09 ENCOUNTER — Telehealth (INDEPENDENT_AMBULATORY_CARE_PROVIDER_SITE_OTHER): Payer: Self-pay

## 2024-03-09 NOTE — Telephone Encounter (Signed)
 Per [facility] the patient picked up prior to the PA being done and states if the PA is approved they will give the patient her money back. I called and left a detailed message for the patient to contact [facility] to see if they will give her her money back. As the PA was approved.    Date: 03/09/2024  [facility] [address] [place], [place] [address]  Member Name: Heather Espinoza  Member ID Number: [id number]  Thank you for trusting your Medicare prescription drug coverage to [employer] As our member, we want to help you get the most value from your prescription drug coverage and help you understand how your coverage works. As a member of [employer] we are pleased to inform you that, upon review of the information provided by you or your doctor, we have approved the requested coverage for the following prescription drug(s): DICYCLOMINE  HCL Capsule Type of coverage approved: Prior Authorization This approval authorizes your coverage from 03/20/2023 - 03/09/2025, unless we notify you otherwise, and as long as the following conditions apply: ? you remain enrolled in our Medicare Part D prescription drug plan, ? your physician or other prescriber continues to prescribe the medication for you, and ? the medication continues to be safe for treating your condition. Depending upon the strength and/or formulation of the drug prescribed by your physician, different quantity limits or safety edits may apply. Please consult your Medicare Part D plans formulary for the specific quantity limit. If you have not already filled your prescription for this approved drug, you may do so at a participating network pharmacy. [id number]

## 2024-03-14 ENCOUNTER — Other Ambulatory Visit (INDEPENDENT_AMBULATORY_CARE_PROVIDER_SITE_OTHER): Payer: Self-pay | Admitting: Gastroenterology

## 2024-03-28 ENCOUNTER — Other Ambulatory Visit (INDEPENDENT_AMBULATORY_CARE_PROVIDER_SITE_OTHER): Payer: Self-pay | Admitting: Gastroenterology

## 2024-04-17 ENCOUNTER — Other Ambulatory Visit (INDEPENDENT_AMBULATORY_CARE_PROVIDER_SITE_OTHER): Payer: Self-pay | Admitting: Gastroenterology

## 2024-04-24 ENCOUNTER — Other Ambulatory Visit (INDEPENDENT_AMBULATORY_CARE_PROVIDER_SITE_OTHER): Payer: Self-pay | Admitting: Gastroenterology

## 2024-06-01 ENCOUNTER — Ambulatory Visit (INDEPENDENT_AMBULATORY_CARE_PROVIDER_SITE_OTHER): Admitting: Gastroenterology

## 2024-07-27 ENCOUNTER — Ambulatory Visit: Admitting: Urology
